# Patient Record
Sex: Female | Born: 1960 | Race: White | Hispanic: No | Marital: Married | Smoking: Former smoker
Health system: Southern US, Community
[De-identification: ages and names within clinical notes are randomized; demographics above are authoritative.]

## PROBLEM LIST (undated history)

## (undated) DIAGNOSIS — M1990 Unspecified osteoarthritis, unspecified site: Secondary | ICD-10-CM

## (undated) DIAGNOSIS — C50919 Malignant neoplasm of unspecified site of unspecified female breast: Secondary | ICD-10-CM

## (undated) DIAGNOSIS — G4730 Sleep apnea, unspecified: Secondary | ICD-10-CM

## (undated) DIAGNOSIS — R7303 Prediabetes: Secondary | ICD-10-CM

## (undated) DIAGNOSIS — K219 Gastro-esophageal reflux disease without esophagitis: Secondary | ICD-10-CM

## (undated) DIAGNOSIS — Z87442 Personal history of urinary calculi: Secondary | ICD-10-CM

## (undated) DIAGNOSIS — Z923 Personal history of irradiation: Secondary | ICD-10-CM

## (undated) DIAGNOSIS — M3500 Sicca syndrome, unspecified: Secondary | ICD-10-CM

## (undated) DIAGNOSIS — Z9989 Dependence on other enabling machines and devices: Secondary | ICD-10-CM

## (undated) DIAGNOSIS — H16072 Perforated corneal ulcer, left eye: Secondary | ICD-10-CM

## (undated) DIAGNOSIS — E0590 Thyrotoxicosis, unspecified without thyrotoxic crisis or storm: Secondary | ICD-10-CM

## (undated) DIAGNOSIS — E049 Nontoxic goiter, unspecified: Secondary | ICD-10-CM

## (undated) DIAGNOSIS — F419 Anxiety disorder, unspecified: Secondary | ICD-10-CM

## (undated) DIAGNOSIS — C189 Malignant neoplasm of colon, unspecified: Secondary | ICD-10-CM

## (undated) DIAGNOSIS — R319 Hematuria, unspecified: Secondary | ICD-10-CM

## (undated) DIAGNOSIS — F32A Depression, unspecified: Secondary | ICD-10-CM

## (undated) HISTORY — DX: Nontoxic goiter, unspecified: E04.9

## (undated) HISTORY — DX: Perforated corneal ulcer, left eye: H16.072

## (undated) HISTORY — PX: EYE SURGERY: SHX253

## (undated) HISTORY — DX: Sjogren syndrome, unspecified: M35.00

## (undated) HISTORY — DX: Thyrotoxicosis, unspecified without thyrotoxic crisis or storm: E05.90

## (undated) HISTORY — PX: COSMETIC SURGERY: SHX468

## (undated) HISTORY — DX: Malignant neoplasm of unspecified site of unspecified female breast: C50.919

## (undated) HISTORY — DX: Anxiety disorder, unspecified: F41.9

## (undated) HISTORY — DX: Hematuria, unspecified: R31.9

## (undated) HISTORY — PX: TONSILLECTOMY: SUR1361

---
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
## 2007-01-07 ENCOUNTER — Ambulatory Visit: Payer: Self-pay

## 2008-04-12 ENCOUNTER — Ambulatory Visit: Payer: Self-pay

## 2008-10-04 ENCOUNTER — Ambulatory Visit: Payer: Self-pay

## 2010-05-22 ENCOUNTER — Ambulatory Visit: Payer: Self-pay

## 2011-12-11 ENCOUNTER — Ambulatory Visit: Payer: Self-pay

## 2013-08-18 ENCOUNTER — Ambulatory Visit: Payer: Self-pay

## 2014-01-17 DIAGNOSIS — M3500 Sicca syndrome, unspecified: Secondary | ICD-10-CM | POA: Insufficient documentation

## 2014-01-31 DIAGNOSIS — Z6841 Body Mass Index (BMI) 40.0 and over, adult: Secondary | ICD-10-CM

## 2014-01-31 DIAGNOSIS — Z833 Family history of diabetes mellitus: Secondary | ICD-10-CM | POA: Insufficient documentation

## 2014-02-01 DIAGNOSIS — E559 Vitamin D deficiency, unspecified: Secondary | ICD-10-CM | POA: Insufficient documentation

## 2014-02-01 DIAGNOSIS — E785 Hyperlipidemia, unspecified: Secondary | ICD-10-CM | POA: Insufficient documentation

## 2014-02-01 DIAGNOSIS — R7302 Impaired glucose tolerance (oral): Secondary | ICD-10-CM | POA: Insufficient documentation

## 2014-12-06 ENCOUNTER — Other Ambulatory Visit: Payer: Self-pay | Admitting: Internal Medicine

## 2014-12-06 DIAGNOSIS — Z1231 Encounter for screening mammogram for malignant neoplasm of breast: Secondary | ICD-10-CM

## 2014-12-15 ENCOUNTER — Ambulatory Visit
Admission: RE | Admit: 2014-12-15 | Discharge: 2014-12-15 | Disposition: A | Discharge status: Home / Self Care | Payer: Self-pay | Source: Ambulatory Visit | Attending: Internal Medicine | Admitting: Internal Medicine

## 2014-12-15 DIAGNOSIS — Z1231 Encounter for screening mammogram for malignant neoplasm of breast: Secondary | ICD-10-CM | POA: Insufficient documentation

## 2015-11-24 DIAGNOSIS — L680 Hirsutism: Secondary | ICD-10-CM | POA: Insufficient documentation

## 2015-12-20 ENCOUNTER — Ambulatory Visit
Admission: RE | Admit: 2015-12-20 | Discharge: 2015-12-20 | Disposition: A | Discharge status: Home / Self Care | Payer: Self-pay | Source: Ambulatory Visit | Attending: Oncology | Admitting: Oncology

## 2015-12-20 ENCOUNTER — Ambulatory Visit: Payer: Self-pay | Attending: Oncology

## 2015-12-20 VITALS — BP 115/77 | HR 76 | Ht 61.02 in | Wt 206.5 lb

## 2015-12-20 DIAGNOSIS — Z0000 Encounter for general adult medical examination without abnormal findings: Secondary | ICD-10-CM

## 2015-12-20 NOTE — Progress Notes (Signed)
Subjective:     Patient ID: Sheena Martinez, female   DOB: September 07, 1960, 55 y.o.   MRN: [id number]  HPI   Review of Systems     Objective:   Physical Exam  Pulmonary/Chest: Right breast exhibits no inverted nipple, no mass, no nipple discharge, no skin change and no tenderness. Left breast exhibits no inverted nipple, no mass, no nipple discharge, no skin change and no tenderness. Breasts are symmetrical.       Assessment:     55 year old patient presents for [facility] clinic visit.  Patient screened, and meets BCCCP eligibility.  Patient does not have insurance, Medicare or Medicaid.  Handout given on Affordable Care Act. Instructed patient on breast self-exam using teach back method.  CBE unremarkable.     Plan:     Sent for bilateral screening mammogram.

## 2016-02-04 NOTE — Progress Notes (Signed)
Letter mailed from [facility] to notify of normal mammogram results.  Patient to return in one year for annual screening.  Copy to [facility]. 

## 2016-08-12 DIAGNOSIS — Z923 Personal history of irradiation: Secondary | ICD-10-CM

## 2016-08-12 HISTORY — DX: Personal history of irradiation: Z92.3

## 2016-08-12 HISTORY — PX: CYST EXCISION: SHX5701

## 2017-04-30 ENCOUNTER — Ambulatory Visit: Payer: Self-pay | Attending: Oncology

## 2017-04-30 ENCOUNTER — Ambulatory Visit
Admission: RE | Admit: 2017-04-30 | Discharge: 2017-04-30 | Disposition: A | Discharge status: Home / Self Care | Payer: Self-pay | Source: Ambulatory Visit | Attending: Oncology | Admitting: Oncology

## 2017-04-30 ENCOUNTER — Encounter (INDEPENDENT_AMBULATORY_CARE_PROVIDER_SITE_OTHER): Payer: Self-pay

## 2017-04-30 VITALS — BP 125/81 | HR 76 | Temp 98.5°F | Ht 61.0 in | Wt 217.0 lb

## 2017-04-30 DIAGNOSIS — Z0000 Encounter for general adult medical examination without abnormal findings: Secondary | ICD-10-CM

## 2017-04-30 NOTE — Progress Notes (Signed)
Subjective:     Patient ID: Sheena Martinez, female   DOB: 1960-11-12, 56 y.o.   MRN: [id number]  HPI   Review of Systems     Objective:   Physical Exam  Pulmonary/Chest: Right breast exhibits no inverted nipple, no mass, no nipple discharge, no skin change and no tenderness. Left breast exhibits no inverted nipple, no mass, no nipple discharge, no skin change and no tenderness. Breasts are symmetrical.       Assessment:     56 year old female returns for annual BCCCP screening. Patient screened, and meets BCCCP eligibility.  Patient does not have insurance, Medicare or Medicaid.  Handout given on Affordable Care Act.  Instructed patient on breast self-exam using teach back method.  CBE unremarkable.  No mass or lump palpated. Patient reports heavy bleeding with menstrual cycle x 3 months.  Encouraged her to call [facility] for consult. Informed patient she may need to fill out financial assistance forms since service will not be covered through Portal.    Plan:     Sent for bilateral screening mammogram.

## 2017-05-01 ENCOUNTER — Other Ambulatory Visit: Payer: Self-pay | Admitting: *Deleted

## 2017-05-01 DIAGNOSIS — N63 Unspecified lump in unspecified breast: Secondary | ICD-10-CM

## 2017-05-12 ENCOUNTER — Other Ambulatory Visit: Payer: Self-pay | Admitting: *Deleted

## 2017-05-12 ENCOUNTER — Ambulatory Visit
Admission: RE | Admit: 2017-05-12 | Discharge: 2017-05-12 | Disposition: A | Discharge status: Home / Self Care | Payer: Medicaid Other | Source: Ambulatory Visit

## 2017-05-12 ENCOUNTER — Ambulatory Visit
Admission: RE | Admit: 2017-05-12 | Discharge: 2017-05-12 | Disposition: A | Discharge status: Home / Self Care | Payer: Medicaid Other | Source: Ambulatory Visit | Attending: Oncology | Admitting: Oncology

## 2017-05-12 DIAGNOSIS — N63 Unspecified lump in unspecified breast: Secondary | ICD-10-CM

## 2017-05-12 DIAGNOSIS — C50212 Malignant neoplasm of upper-inner quadrant of left female breast: Secondary | ICD-10-CM | POA: Insufficient documentation

## 2017-05-12 DIAGNOSIS — N6322 Unspecified lump in the left breast, upper inner quadrant: Secondary | ICD-10-CM | POA: Insufficient documentation

## 2017-05-12 DIAGNOSIS — C50919 Malignant neoplasm of unspecified site of unspecified female breast: Secondary | ICD-10-CM

## 2017-05-12 HISTORY — PX: BREAST LUMPECTOMY: SHX2

## 2017-05-12 HISTORY — DX: Malignant neoplasm of unspecified site of unspecified female breast: C50.919

## 2017-05-12 HISTORY — PX: BREAST BIOPSY: SHX20

## 2017-05-13 ENCOUNTER — Encounter: Payer: Self-pay | Admitting: Family Medicine

## 2017-05-13 ENCOUNTER — Other Ambulatory Visit: Payer: Self-pay | Admitting: Pathology

## 2017-05-14 ENCOUNTER — Encounter: Payer: Self-pay | Admitting: General Surgery

## 2017-05-14 NOTE — Progress Notes (Signed)
  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  Radilogist spoke to patient with biopsy results.  Phoned patient to plan surgical/oncolgy follow-up She is scheduled to see Dr. Bary Castilla on 05/20/17 arriving at 4:30, and she will see Dr. Tasia Catchings in the [place] on 05/22/17 at 2:00. Will fill out BCCCPm forms at that visit.  Support given to patient.

## 2017-05-15 ENCOUNTER — Encounter: Payer: Self-pay | Admitting: *Deleted

## 2017-05-15 ENCOUNTER — Other Ambulatory Visit: Payer: Self-pay

## 2017-05-19 LAB — SURGICAL PATHOLOGY

## 2017-05-20 ENCOUNTER — Inpatient Hospital Stay: Payer: Self-pay

## 2017-05-20 ENCOUNTER — Encounter: Payer: Self-pay | Admitting: Diagnostic Radiology

## 2017-05-20 ENCOUNTER — Encounter: Payer: Self-pay | Admitting: *Deleted

## 2017-05-20 ENCOUNTER — Ambulatory Visit (INDEPENDENT_AMBULATORY_CARE_PROVIDER_SITE_OTHER): Payer: Medicaid Other | Admitting: General Surgery

## 2017-05-20 VITALS — BP 124/76 | HR 97 | Resp 14 | Ht 60.0 in | Wt 220.0 lb

## 2017-05-20 DIAGNOSIS — C50212 Malignant neoplasm of upper-inner quadrant of left female breast: Secondary | ICD-10-CM

## 2017-05-20 DIAGNOSIS — Z170 Estrogen receptor positive status [ER+]: Secondary | ICD-10-CM

## 2017-05-20 DIAGNOSIS — C50412 Malignant neoplasm of upper-outer quadrant of left female breast: Secondary | ICD-10-CM

## 2017-05-20 NOTE — Progress Notes (Signed)
Patient ID: Sheena Martinez, female   DOB: 21-Oct-1960, 56 y.o.   MRN: [id number]  Chief Complaint  Patient presents with  . Cancer    left breast    HPI Sheena Martinez is a 56 y.o. female.  who presents for a breast evaluation referred by Al Pimple BCCCP. The most recent mammogram and left breast biopsy was done on 05-12-17. She could not feel anything different in the breast. Patient does not perform regular self breast checks but she does get regular mammograms done.    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 She is here with her friend, Jacques Earthly.  HPI  Past Medical History:  Diagnosis Date  . 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 BREAST BIOPSY Left 05/12/2017   [place] bx/ INVASIVE MAMMARY CARCINOMA  . CESAREAN SECTION  1995  . CYST EXCISION  2018   pilar cyst/ Dr Will Bonnet    Family History  Problem Relation Age of Onset  . Breast cancer Maternal Aunt 80  . Diabetes Mother   . Liver disease Father     Social History Social History  Substance Use Topics  . Smoking status: Former Smoker    Years: 7.00    Quit date: 08/13/1979  . Smokeless tobacco: Never Used  . Alcohol use No    Allergies  Allergen Reactions  . Sulfamethoxazole-Trimethoprim Rash    Chest tightness. Bactrim    Current Outpatient Prescriptions  Medication Sig Dispense Refill  . meloxicam (MOBIC) 15 MG tablet Take 15 mg by mouth.    Marland Kitchen PARoxetine (PAXIL) 20 MG tablet Take 20 mg by mouth.     No current facility-administered medications for this visit.     Review of Systems Review of Systems  Constitutional: Negative.   Respiratory: Negative.   Cardiovascular: Negative.     Blood pressure 124/76, pulse 97, resp. rate 14, height 5' (1.524 m), weight 99.8 kg (220 lb), last menstrual period 05/11/2017.  Physical Exam Physical Exam  Constitutional: She is oriented to person,  place, and time. She appears well-developed and well-nourished.  HENT:  Mouth/Throat: Oropharynx is clear and moist.  Eyes: Conjunctivae are normal. No scleral icterus.  Neck: Neck supple.  Cardiovascular: Normal rate, regular rhythm and normal heart sounds.   Pulmonary/Chest: Effort normal and breath sounds normal. No respiratory distress. Right breast exhibits no inverted nipple, no mass, no nipple discharge, no skin change and no tenderness. Left breast exhibits no inverted nipple, no mass, no nipple discharge, no skin change and no tenderness.  Skin cyst mid sternum. Bruising at biopsy site left breast upper inner quadrant   Lymphadenopathy:    She has no cervical adenopathy.    She has no axillary adenopathy.  Neurological: She is alert and oriented to person, place, and time.  Skin: Skin is warm and dry.  Psychiatric: Her behavior is normal.    Data Reviewed Mammograms from May 2016 through October 2018 were reviewed. Ultrasound from 05/12/2017 reviewed. Small nodular area in the upper-inner quadrant of the left breast.   05/12/2017 biopsy results.  DIAGNOSIS:  A. LEFT BREAST, 11:00, 4 CMFN; BIOPSY:  - INVASIVE MAMMARY CARCINOMA, NO SPECIAL TYPE.  BREAST BIOMARKER TESTS  Estrogen Receptor (ER) Status: POSITIVE, >90% nuclear staining    Average intensity of staining: Strong  Progesterone Receptor (PgR) Status: POSITIVE, >90% nuclear staining    Average intensity of  staining: Strong  HER-2/neu negative by fish.  Ultrasound examination of the left breast was completed to determine a preoperative needle localization would be required. This was a very small lesion and with the distortion from her recent biopsy the area could not be clearly identified. No images, no charge.  Assessment    Stage I carcinoma the left breast.    Plan    The majority of the visit was spent reviewing the options for breast cancer treatment. Breast conservation with lumpectomy and radiation  therapy  was presented as equivalent to mastectomy for long-term control. The pros and cons of each treatment regimen were reviewed. The indications for additional therapy such as chemotherapy were touched on briefly, realizing that the majority of information required to determine if chemotherapy would be of benefit is not available at this time. Opportunity for second surgical opinion reviewed.     Informational websites provided. Brochure outlining treatment options provided. Opportunity for plastic surgery consultation discussed.  The patient is scheduled to meet with medical oncology on Thursday, October 11. I encouraged her to keep his appointment to have an opportunity to review her options once again and to get a formal opinion regarding the likelihood of chemotherapy based on knowledge presently available.  The patient will consider her options and notify the office of how she would like to proceed.   HPI, Physical Exam, Assessment and Plan have been scribed under the direction and in the presence of Robert Bellow, MD. Karie Fetch, RN  I have completed the exam and reviewed the above documentation for accuracy and completeness.  I agree with the above.  Haematologist has been used and any errors in dictation or transcription are unintentional.  Hervey Ard, M.D., F.A.C.S. Robert Bellow 05/22/2017, 8:03 AM

## 2017-05-20 NOTE — Patient Instructions (Signed)
The patient is aware to call back for any questions or concerns.  

## 2017-05-22 ENCOUNTER — Encounter: Payer: Self-pay | Admitting: Oncology

## 2017-05-22 ENCOUNTER — Inpatient Hospital Stay: Payer: Medicaid Other | Attending: Oncology | Admitting: Oncology

## 2017-05-22 VITALS — BP 120/77 | HR 62 | Temp 98.2°F | Resp 16 | Wt 221.1 lb

## 2017-05-22 DIAGNOSIS — Z803 Family history of malignant neoplasm of breast: Secondary | ICD-10-CM

## 2017-05-22 DIAGNOSIS — Z170 Estrogen receptor positive status [ER+]: Secondary | ICD-10-CM

## 2017-05-22 DIAGNOSIS — M3500 Sicca syndrome, unspecified: Secondary | ICD-10-CM

## 2017-05-22 DIAGNOSIS — C50212 Malignant neoplasm of upper-inner quadrant of left female breast: Secondary | ICD-10-CM

## 2017-05-22 DIAGNOSIS — E282 Polycystic ovarian syndrome: Secondary | ICD-10-CM | POA: Diagnosis not present

## 2017-05-22 DIAGNOSIS — Z87891 Personal history of nicotine dependence: Secondary | ICD-10-CM | POA: Diagnosis not present

## 2017-05-22 NOTE — Progress Notes (Signed)
Hematology/Oncology Consult note [facility] [phone] [phone]   Patient Care Team: Judeen Hammans, MD as PCP - General (Family Medicine) Jim Like, RN as Registered Nurse Scarlett Presto, RN as Registered Nurse  REFERRING PROVIDER: Judeen Hammans, MD CHIEF COMPLAINTS/PURPOSE OF CONSULTATION:  Evaluation of Breast cancer  HISTORY OF PRESENTING ILLNESS:  Sheena Martinez is a @ 56 y.o.  female with PMH listed below who was referred to me for evaluation of newly diagnosed breast cancer. Patient reports that she is been doing fine and she underwent screening annual mammogram which discovered abdominal findings.09/19/2018patient underwent screening, bilateral mammogram warrant further evaluation.on 05/12/2017, patient underwent diagnostic mammogram which discoveredleft breast irregular mass was spiculated margins in the upper slightly inner left breast. Ultrasound of the superior left breast showed hypoechoic mass with spiculated margins at 11:00, 4 cm from the nipple measuring 574 mm. Patient underwent biopsy of the left breast mass, pathology showed invasive mammary carcinoma. ER/PR >+90%, HER-2 IHC +2 equivocal, fish negative. Patient has been evaluated by Dr. Lemar Livings this week. Patient presented to the clinic today, accomplished by her daughter-in-law to establish care. Patient overall reports feeling well. She has a positive family history of breast cancer in her maternal aunt. 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 She does not have insurance pending applying Medicaid.  Denies feeling any lumps, nipple discharge. She reports mild soreness at the the site of biopsy. She has a history of Sjogren disease for which she has had tear duct surgery. She also reports history of PCOS with elevated testosterone level. She is perimenopausal, her last menstrual period reported as 3 months ago,  last for about 3 months. Denies any hormonal replacement therapy.     ROS:  Constitutional: Negative for fever, night sweats,unintentional weight loss, change in appetite. (+) weight gain HENT: Negative for ear pain, hearing loss, nasal bleeding Eyes: Negative for eye pain, double vision   Respiratory: Negative for wheezing, shortness of breath, cough Cardiovascular: Negative for chest pain, palpitation.   Gastrointestinal: Negative abdominal pain, diarrhea, nausea vomiting Endocrine: Negative  Genitourinary: Negative for dysuria, hematuria, frequency Skin: Negative for rash, iching, bruising Neurological: Negative for headache, dizziness, seizure Hematological: Negative for easy bruising/bleeding, lymph node enlargement Psychiatric/Behavioral: Negative for depression, anxiety, suicidality MEDICAL HISTORY:  Past Medical History:  Diagnosis Date  . 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 BREAST BIOPSY Left 05/12/2017   [place] bx/ INVASIVE MAMMARY CARCINOMA  . CESAREAN SECTION  1995  . CYST EXCISION  2018   pilar cyst/ Dr Orma Flaming    SOCIAL HISTORY: Social History   Social History  . Marital status: Married    Spouse name: N/A  . Number of children: N/A  . Years of education: N/A   Occupational History  . Not on file.   Social History Main Topics  . Smoking status: Former Smoker    Years: 7.00    Quit date: 08/13/1979  . Smokeless tobacco: Never Used  . Alcohol use No  . Drug use: No  . Sexual activity: Not on file   Other Topics Concern  . Not on file   Social History Narrative  . No narrative on file    FAMILY HISTORY: Family History  Problem Relation Age of Onset  . Breast  cancer Maternal Aunt 45  . Diabetes Mother   . Liver disease Father     ALLERGIES:  is allergic to sulfamethoxazole-trimethoprim.  MEDICATIONS:    Current Outpatient Prescriptions  Medication Sig Dispense Refill  . meloxicam (MOBIC) 15 MG tablet Take 15 mg by mouth.    Marland Kitchen PARoxetine (PAXIL) 20 MG tablet Take 20 mg by mouth.     No current facility-administered medications for this visit.       Marland Kitchen  PHYSICAL EXAMINATION: ECOG PERFORMANCE STATUS: 0 - Asymptomatic Vitals:   05/22/17 1427  BP: 120/77  Pulse: 62  Resp: 16  Temp: 98.2 F (36.8 C)   Filed Weights   05/22/17 1427  Weight: 221 lb 1 oz (100.3 kg)   GENERAL: No distress, well nourished.  SKIN:  No rashes or significant lesions  HEAD: Normocephalic, No masses, lesions, tenderness or abnormalities  EYES: Conjunctiva are pink, non icteric ENT: External ears normal ,lips , buccal mucosa, and tongue normal and mucous membranes are moist  LYMPH: No palpable cervical and axillary lymphadenopathy  LUNGS: Clear to auscultation, no crackles or wheezes HEART: Regular rate & rhythm, no murmurs, no gallops, S1 normal and S2 normal  ABDOMEN: Abdomen soft, non-tender, normal bowel sounds, I did not appreciate any  masses or organomegaly  MUSCULOSKELETAL: No CVA tenderness and no tenderness on percussion of the back or rib cage.  EXTREMITIES: No edema, no skin discoloration or tenderness NEURO: Alert & oriented, no focal motor/sensory deficits. Breast exam was performed in seated and lying down position. No evidence of any palpable masses. No evidence of axillary adenopathy.No nipple discharge.   LABORATORY DATA:  I have reviewed the data as listed: no recent labs.  No results found for: WBC, HGB, HCT, MCV, PLT No results for input(s): NA, K, CL, CO2, GLUCOSE, BUN, CREATININE, CALCIUM, GFRNONAA, GFRAA, PROT, ALBUMIN, AST, ALT, ALKPHOS, BILITOT, BILIDIR, IBILI in the last 8760 hours.  RADIOGRAPHIC STUDIES: I have personally reviewed the radiological images as listed and agreed with the findings in the report. Mammogram digital diagnostic 05/12/2017 FINDINGS: There is an  irregular mass with spiculated margins in the upper, slightly inner left breast, corresponding to the abnormality seen on screening mammogram.  Mammographic images were processed with CAD.  Targeted ultrasound of the superior left breast was performed demonstrating an irregular, hypoechoic mass with spiculated margins at 11 o'clock, 4 cm from the nipple measuring approximately 5 x 7 x 4 mm, corresponding to the mass seen mammographically. Targeted ultrasound of the left axilla demonstrates no suspicious appearing axillary lymph nodes.  IMPRESSION: Indeterminate left breast mass.  RECOMMENDATION: Ultrasound-guided left breast biopsy.  Pathology DIAGNOSIS:  A. LEFT BREAST, 11:00, 4 CMFN; BIOPSY:  - INVASIVE MAMMARY CARCINOMA, NO SPECIAL TYPE.   Size of invasive carcinoma: 0.5 cm in this sample  Histologic grade of invasive carcinoma: Grade 1    Glandular/tubular differentiation score: 2    Nuclear pleomorphism score: 2    Mitotic rate score: 1  DCIS: Negative  Lymphovascular invasion: Negative  BREAST BIOMARKER TESTS  Estrogen Receptor (ER) Status: POSITIVE, >90% nuclear staining    Average intensity of staining: Strong  Progesterone Receptor (PgR) Status: POSITIVE, >90% nuclear staining    Average intensity of staining: Strong  HER2 (by immunohistochemistry): EQUIVOCAL, 2+  Percentage of cells with uniform intense complete membrane staining: 0% Breast Biomarker Reporting Template: HER-2 FISH  BREAST BIOMARKER TESTS  HER2 (ERBB2) (by in situ hybridization): Negative  Number of observers: 2  Number of invasive tumor cells  counted: 40  Dual probe assay  Average number of HER2 signals per cell: 3.0  Average number of CEP17 signals per cell: 2.2  HER2/CEP17 ratio: 1.36   ASSESSMENT & PLAN:  1. Malignant neoplasm of upper-inner quadrant of left breast in female, estrogen receptor positive ([place])    Image results and the pathology results. were discussed  with patient. Recommend lumpectomy with sentinel lymph node biopsy followed by radiation. Would like to obtain Oncotype to evaluate her recurrence risk which will guide the need of chemotherapy. She is ER/PR strongly positive and will benefit from adjuvant hormonal therapy. Refer to RadOnc. Suggest patient to obtain a GYN evaluation her underlying PCOS management and determination of her menopausal status. . All questions were answered. The patient knows to call the clinic with any problems questions or concerns.  Return of visit: 1-2 weeks after surgery.  Thank you for this kind referral and the opportunity to participate in the care of this patient. A copy of today's note is routed to referring provider    Earlie Server, MD, PhD Hematology Oncology [facility] Pager- [phone] 05/22/2017

## 2017-05-22 NOTE — Progress Notes (Signed)
Patient here today as a new patient with breast cancer

## 2017-05-23 ENCOUNTER — Telehealth: Payer: Self-pay | Admitting: *Deleted

## 2017-05-23 NOTE — Telephone Encounter (Signed)
Patient called and wants to go ahead and schedule her left breast lumpectomy. Please call her back to get that scheduled.

## 2017-05-23 NOTE — Telephone Encounter (Signed)
Spoke with patient about her choice for surgery and she would like to go ahead and have a lumpectomy done. She would like to have this done on 06/09/17. I let her know we would be contacting her once orders were received and the surgery was posted with arrival time and location instructions.

## 2017-05-24 ENCOUNTER — Other Ambulatory Visit: Payer: Self-pay | Admitting: General Surgery

## 2017-05-26 ENCOUNTER — Other Ambulatory Visit: Payer: Self-pay | Admitting: General Surgery

## 2017-05-26 ENCOUNTER — Other Ambulatory Visit: Payer: Self-pay | Admitting: *Deleted

## 2017-05-26 DIAGNOSIS — C50212 Malignant neoplasm of upper-inner quadrant of left female breast: Secondary | ICD-10-CM

## 2017-05-26 DIAGNOSIS — Z170 Estrogen receptor positive status [ER+]: Secondary | ICD-10-CM

## 2017-05-28 ENCOUNTER — Telehealth: Payer: Self-pay | Admitting: *Deleted

## 2017-05-28 NOTE — Telephone Encounter (Signed)
Patient's surgery has been scheduled for 06-10-17 at [facility].  The patient was notified of arrival time and day of surgery instructions per Caryl-Lyn yesterday.

## 2017-06-03 ENCOUNTER — Inpatient Hospital Stay: Admission: RE | Admit: 2017-06-03 | Payer: Self-pay | Source: Ambulatory Visit

## 2017-06-04 ENCOUNTER — Encounter
Admission: RE | Admit: 2017-06-04 | Discharge: 2017-06-04 | Disposition: A | Discharge status: Home / Self Care | Payer: Self-pay | Source: Ambulatory Visit | Attending: General Surgery | Admitting: General Surgery

## 2017-06-04 HISTORY — DX: Gastro-esophageal reflux disease without esophagitis: K21.9

## 2017-06-04 HISTORY — DX: Unspecified osteoarthritis, unspecified site: M19.90

## 2017-06-04 NOTE — Patient Instructions (Signed)
  Your procedure is scheduled on: 06-10-17 Report to [place] AT 8:15 AM  Remember: Instructions that are not followed completely may result in serious medical risk, up to and including death, or upon the discretion of your surgeon and anesthesiologist your surgery may need to be rescheduled.    _x___ 1. Do not eat food after midnight the night before your procedure. You may drink clear liquids up to 2 hours before you are scheduled to arrive at the hospital for your procedure.  Do not drink clear liquids within 2 hours of your scheduled arrival to the hospital.  Clear liquids include  --Water or Apple juice without pulp  --Clear carbohydrate beverage such as ClearFast or Gatorade  --Black Coffee or Clear Tea (No milk, no creamers, do not add anything to the coffee or Tea Type 1 and type 2 diabetics should only drink water.  No gum chewing or hard candies.     __x__ 2. No Alcohol for 24 hours before or after surgery.   __x__3. No Smoking for 24 prior to surgery.   ____  4. Bring all medications with you on the day of surgery if instructed.    __x__ 5. Notify your doctor if there is any change in your medical condition     (cold, fever, infections).     Do not wear jewelry, make-up, hairpins, clips or nail polish.  Do not wear lotions, powders, or perfumes. You may wear deodorant.  Do not shave 48 hours prior to surgery. Men may shave face and neck.  Do not bring valuables to the hospital.    [facility] is not responsible for any belongings or valuables.               Contacts, dentures or bridgework may not be worn into surgery.  Leave your suitcase in the car. After surgery it may be brought to your room.  For patients admitted to the hospital, discharge time is determined by your  treatment team.   Patients discharged the day of surgery will not be allowed to drive home.  You will need someone to drive you home and stay with you the night of your procedure.     Please read over the following fact sheets that you were given:     ____ Take anti-hypertensive listed below, cardiac, seizure, asthma, anti-reflux and psychiatric medicines. These include:  1. NONE  2.  3.  4.  5.  6.  ____Fleets enema or Magnesium Citrate as directed.   ____ Use CHG Soap or sage wipes as directed on instruction sheet   ____ Use inhalers on the day of surgery and bring to hospital day of surgery  ____ Stop Metformin and Janumet 2 days prior to surgery.    ____ Take 1/2 of usual insulin dose the night before surgery and none on the morning surgery.   ____ Follow recommendations from Cardiologist, Pulmonologist or PCP regarding stopping Aspirin, Coumadin, Plavix ,Eliquis, Effient, or Pradaxa, and Pletal.  ____Stop Anti-inflammatories such as Advil, Aleve, Ibuprofen, Motrin, Naproxen, Naprosyn, Goodies powders or aspirin products. OK to take Tylenol    ____ Stop supplements until after surgery.     ____ Bring C-Pap to the hospital.

## 2017-06-04 NOTE — Progress Notes (Signed)
  Oncology Nurse Navigator Documentation  Navigator Location: [facility] Onc (06/04/17 0800)   )Navigator Encounter Type: Telephone (06/04/17 0800)                         Barriers/Navigation Needs: Financial (06/04/17 0800)   Interventions: Coordination of Care (06/04/17 0800)                      Time Spent with Patient: 15 (06/04/17 0800)   Notified patient that DSS has received response from [facility] for [facility],  She is to contact Jeanella Anton with account numbers for bills received so that Medicaid can be applied.

## 2017-06-10 ENCOUNTER — Ambulatory Visit: Payer: Medicaid Other | Admitting: Certified Registered Nurse Anesthetist

## 2017-06-10 ENCOUNTER — Ambulatory Visit
Admission: RE | Admit: 2017-06-10 | Discharge: 2017-06-10 | Disposition: A | Discharge status: Home / Self Care | Payer: Medicaid Other | Source: Ambulatory Visit | Attending: General Surgery | Admitting: General Surgery

## 2017-06-10 ENCOUNTER — Encounter
Admission: RE | Disposition: A | Discharge status: Home / Self Care | Payer: Self-pay | Source: Ambulatory Visit | Attending: General Surgery

## 2017-06-10 DIAGNOSIS — C50212 Malignant neoplasm of upper-inner quadrant of left female breast: Secondary | ICD-10-CM

## 2017-06-10 DIAGNOSIS — F419 Anxiety disorder, unspecified: Secondary | ICD-10-CM | POA: Diagnosis not present

## 2017-06-10 DIAGNOSIS — Z170 Estrogen receptor positive status [ER+]: Secondary | ICD-10-CM

## 2017-06-10 DIAGNOSIS — E039 Hypothyroidism, unspecified: Secondary | ICD-10-CM | POA: Insufficient documentation

## 2017-06-10 DIAGNOSIS — C50912 Malignant neoplasm of unspecified site of left female breast: Secondary | ICD-10-CM | POA: Diagnosis present

## 2017-06-10 DIAGNOSIS — Z87891 Personal history of nicotine dependence: Secondary | ICD-10-CM | POA: Insufficient documentation

## 2017-06-10 DIAGNOSIS — K219 Gastro-esophageal reflux disease without esophagitis: Secondary | ICD-10-CM | POA: Diagnosis not present

## 2017-06-10 DIAGNOSIS — Z6841 Body Mass Index (BMI) 40.0 and over, adult: Secondary | ICD-10-CM | POA: Insufficient documentation

## 2017-06-10 HISTORY — PX: BREAST LUMPECTOMY WITH SENTINEL LYMPH NODE BIOPSY: SHX5597

## 2017-06-10 LAB — POCT PREGNANCY, URINE: PREG TEST UR: NEGATIVE

## 2017-06-10 SURGERY — BREAST LUMPECTOMY WITH SENTINEL LYMPH NODE BX
Anesthesia: General | Site: Breast | Laterality: Left

## 2017-06-10 MED ORDER — HYDROCODONE-ACETAMINOPHEN 5-325 MG PO TABS
ORAL_TABLET | ORAL | Status: AC
  Administered 2017-06-10: 1 via ORAL
  Filled 2017-06-10: qty 1

## 2017-06-10 MED ORDER — LIDOCAINE HCL (CARDIAC) 20 MG/ML IV SOLN
INTRAVENOUS | Status: DC | PRN
  Administered 2017-06-10: 60 mg via INTRAVENOUS

## 2017-06-10 MED ORDER — FENTANYL CITRATE (PF) 100 MCG/2ML IJ SOLN
INTRAMUSCULAR | Status: AC
  Filled 2017-06-10: qty 2

## 2017-06-10 MED ORDER — LACTATED RINGERS IV SOLN
INTRAVENOUS | Status: DC
  Administered 2017-06-10: 10:00:00 via INTRAVENOUS

## 2017-06-10 MED ORDER — MIDAZOLAM HCL 2 MG/2ML IJ SOLN
INTRAMUSCULAR | Status: AC
  Filled 2017-06-10: qty 2

## 2017-06-10 MED ORDER — MIDAZOLAM HCL 2 MG/2ML IJ SOLN
INTRAMUSCULAR | Status: DC | PRN
  Administered 2017-06-10: 2 mg via INTRAVENOUS

## 2017-06-10 MED ORDER — DEXAMETHASONE SODIUM PHOSPHATE 10 MG/ML IJ SOLN
INTRAMUSCULAR | Status: AC
  Filled 2017-06-10: qty 1

## 2017-06-10 MED ORDER — TECHNETIUM TC 99M SULFUR COLLOID FILTERED
0.8420 | Freq: Once | INTRAVENOUS | Status: AC | PRN
  Administered 2017-06-10: 0.842 via INTRADERMAL

## 2017-06-10 MED ORDER — GLYCOPYRROLATE 0.2 MG/ML IJ SOLN
INTRAMUSCULAR | Status: DC | PRN
  Administered 2017-06-10: 0.2 mg via INTRAVENOUS

## 2017-06-10 MED ORDER — FAMOTIDINE 20 MG PO TABS
ORAL_TABLET | ORAL | Status: AC
  Administered 2017-06-10: 20 mg via ORAL
  Filled 2017-06-10: qty 1

## 2017-06-10 MED ORDER — HYDROCODONE-ACETAMINOPHEN 5-325 MG PO TABS: 1.0000 | ORAL_TABLET | ORAL | 0 refills | Status: DC | PRN

## 2017-06-10 MED ORDER — BUPIVACAINE-EPINEPHRINE (PF) 0.5% -1:200000 IJ SOLN
INTRAMUSCULAR | Status: AC
  Filled 2017-06-10: qty 30

## 2017-06-10 MED ORDER — FENTANYL CITRATE (PF) 100 MCG/2ML IJ SOLN
25.0000 ug | INTRAMUSCULAR | Status: AC | PRN
  Administered 2017-06-10 (×6): 25 ug via INTRAVENOUS

## 2017-06-10 MED ORDER — ONDANSETRON HCL 4 MG/2ML IJ SOLN
INTRAMUSCULAR | Status: DC | PRN
  Administered 2017-06-10: 4 mg via INTRAVENOUS

## 2017-06-10 MED ORDER — ONDANSETRON HCL 4 MG/2ML IJ SOLN
INTRAMUSCULAR | Status: AC
  Filled 2017-06-10: qty 2

## 2017-06-10 MED ORDER — DEXAMETHASONE SODIUM PHOSPHATE 10 MG/ML IJ SOLN
INTRAMUSCULAR | Status: DC | PRN
  Administered 2017-06-10: 10 mg via INTRAVENOUS

## 2017-06-10 MED ORDER — BUPIVACAINE-EPINEPHRINE (PF) 0.5% -1:200000 IJ SOLN
INTRAMUSCULAR | Status: DC | PRN
  Administered 2017-06-10: 30 mL

## 2017-06-10 MED ORDER — KETOROLAC TROMETHAMINE 30 MG/ML IJ SOLN
INTRAMUSCULAR | Status: AC
  Filled 2017-06-10: qty 1

## 2017-06-10 MED ORDER — ACETAMINOPHEN 10 MG/ML IV SOLN
INTRAVENOUS | Status: AC
Start: 2017-06-10 — End: 2017-06-10
  Filled 2017-06-10: qty 100

## 2017-06-10 MED ORDER — FENTANYL CITRATE (PF) 100 MCG/2ML IJ SOLN
INTRAMUSCULAR | Status: AC
  Administered 2017-06-10: 25 ug via INTRAVENOUS
  Filled 2017-06-10: qty 2

## 2017-06-10 MED ORDER — METHYLENE BLUE 0.5 % INJ SOLN
INTRAVENOUS | Status: AC
  Filled 2017-06-10: qty 10

## 2017-06-10 MED ORDER — METHYLENE BLUE 0.5 % INJ SOLN
INTRAVENOUS | Status: DC | PRN
  Administered 2017-06-10: 5 mL via SUBMUCOSAL

## 2017-06-10 MED ORDER — HYDROCODONE-ACETAMINOPHEN 5-325 MG PO TABS
ORAL_TABLET | ORAL | Status: AC
  Filled 2017-06-10: qty 1

## 2017-06-10 MED ORDER — FAMOTIDINE 20 MG PO TABS
20.0000 mg | ORAL_TABLET | Freq: Once | ORAL | Status: AC
  Administered 2017-06-10: 20 mg via ORAL

## 2017-06-10 MED ORDER — FENTANYL CITRATE (PF) 100 MCG/2ML IJ SOLN
INTRAMUSCULAR | Status: DC | PRN
  Administered 2017-06-10: 25 ug via INTRAVENOUS
  Administered 2017-06-10: 50 ug via INTRAVENOUS
  Administered 2017-06-10 (×3): 25 ug via INTRAVENOUS
  Administered 2017-06-10: 50 ug via INTRAVENOUS

## 2017-06-10 MED ORDER — ONDANSETRON HCL 4 MG/2ML IJ SOLN
4.0000 mg | Freq: Once | INTRAMUSCULAR | Status: DC | PRN
Start: 2017-06-10 — End: 2017-06-10

## 2017-06-10 MED ORDER — ACETAMINOPHEN 10 MG/ML IV SOLN
INTRAVENOUS | Status: DC | PRN
  Administered 2017-06-10: 1000 mg via INTRAVENOUS

## 2017-06-10 MED ORDER — PHENYLEPHRINE HCL 10 MG/ML IJ SOLN
INTRAMUSCULAR | Status: DC | PRN
  Administered 2017-06-10 (×2): 100 ug via INTRAVENOUS
  Administered 2017-06-10: 50 ug via INTRAVENOUS
  Administered 2017-06-10 (×5): 100 ug via INTRAVENOUS

## 2017-06-10 MED ORDER — KETOROLAC TROMETHAMINE 30 MG/ML IJ SOLN
INTRAMUSCULAR | Status: DC | PRN
  Administered 2017-06-10: 30 mg via INTRAVENOUS

## 2017-06-10 MED ORDER — HYDROCODONE-ACETAMINOPHEN 5-325 MG PO TABS
1.0000 | ORAL_TABLET | ORAL | Status: DC | PRN
  Administered 2017-06-10 (×2): 1 via ORAL

## 2017-06-10 MED ORDER — PROPOFOL 10 MG/ML IV BOLUS
INTRAVENOUS | Status: DC | PRN
  Administered 2017-06-10: 200 mg via INTRAVENOUS

## 2017-06-10 SURGICAL SUPPLY — 51 items
BANDAGE ELASTIC 6 LF NS (GAUZE/BANDAGES/DRESSINGS) ×2 IMPLANT
BLADE SURG 15 STRL SS SAFETY (BLADE) ×4 IMPLANT
BNDG GAUZE 4.5X4.1 6PLY STRL (MISCELLANEOUS) ×2 IMPLANT
BRA SURGICAL MEDIUM (MISCELLANEOUS) ×2 IMPLANT
BULB RESERV EVAC DRAIN JP 100C (MISCELLANEOUS) IMPLANT
CANISTER SUCT 1200ML W/VALVE (MISCELLANEOUS) ×2 IMPLANT
CHLORAPREP W/TINT 26ML (MISCELLANEOUS) ×2 IMPLANT
CNTNR SPEC 2.5X3XGRAD LEK (MISCELLANEOUS) ×1
CONT SPEC 4OZ STER OR WHT (MISCELLANEOUS) ×1
CONTAINER SPEC 2.5X3XGRAD LEK (MISCELLANEOUS) ×1 IMPLANT
COVER PROBE FLX POLY STRL (MISCELLANEOUS) ×2 IMPLANT
DEVICE DUBIN SPECIMEN MAMMOGRA (MISCELLANEOUS) ×2 IMPLANT
DRAIN CHANNEL JP 15F RND 16 (MISCELLANEOUS) IMPLANT
DRAPE LAPAROTOMY TRNSV 106X77 (MISCELLANEOUS) ×2 IMPLANT
DRSG TELFA 3X8 NADH (GAUZE/BANDAGES/DRESSINGS) ×2 IMPLANT
ELECT CAUTERY BLADE TIP 2.5 (TIP) ×2
ELECT REM PT RETURN 9FT ADLT (ELECTROSURGICAL) ×2
ELECTRODE CAUTERY BLDE TIP 2.5 (TIP) ×1 IMPLANT
ELECTRODE REM PT RTRN 9FT ADLT (ELECTROSURGICAL) ×1 IMPLANT
GAUZE FLUFF 18X24 1PLY STRL (GAUZE/BANDAGES/DRESSINGS) ×2 IMPLANT
GAUZE SPONGE 4X4 12PLY STRL (GAUZE/BANDAGES/DRESSINGS) ×2 IMPLANT
GLOVE BIO SURGEON STRL SZ7.5 (GLOVE) ×2 IMPLANT
GLOVE INDICATOR 8.0 STRL GRN (GLOVE) ×2 IMPLANT
GOWN STRL REUS W/ TWL LRG LVL3 (GOWN DISPOSABLE) ×2 IMPLANT
GOWN STRL REUS W/TWL LRG LVL3 (GOWN DISPOSABLE) ×2
KIT RM TURNOVER STRD PROC AR (KITS) ×2 IMPLANT
LABEL OR SOLS (LABEL) ×2 IMPLANT
MARGIN MAP 10MM (MISCELLANEOUS) ×2 IMPLANT
NDL SAFETY 22GX1.5 (NEEDLE) ×2 IMPLANT
NEEDLE HYPO 25X1 1.5 SAFETY (NEEDLE) ×4 IMPLANT
PACK BASIN MINOR ARMC (MISCELLANEOUS) ×2 IMPLANT
SHEARS FOC LG CVD HARMONIC 17C (MISCELLANEOUS) IMPLANT
SHEARS HARMONIC 9CM CVD (BLADE) ×2 IMPLANT
SLEVE PROBE SENORX GAMMA FIND (MISCELLANEOUS) ×2 IMPLANT
STRIP CLOSURE SKIN 1/2X4 (GAUZE/BANDAGES/DRESSINGS) ×2 IMPLANT
SUT ETHILON 3-0 FS-10 30 BLK (SUTURE) ×2
SUT SILK 2 0 (SUTURE) ×1
SUT SILK 2-0 18XBRD TIE 12 (SUTURE) ×1 IMPLANT
SUT VIC AB 2-0 CT1 27 (SUTURE) ×3
SUT VIC AB 2-0 CT1 TAPERPNT 27 (SUTURE) ×3 IMPLANT
SUT VIC AB 3-0 SH 27 (SUTURE) ×2
SUT VIC AB 3-0 SH 27X BRD (SUTURE) ×2 IMPLANT
SUT VIC AB 4-0 FS2 27 (SUTURE) ×4 IMPLANT
SUT VICRYL+ 3-0 144IN (SUTURE) ×2 IMPLANT
SUTURE EHLN 3-0 FS-10 30 BLK (SUTURE) ×1 IMPLANT
SWABSTK COMLB BENZOIN TINCTURE (MISCELLANEOUS) ×2 IMPLANT
SYR BULB IRRIG 60ML STRL (SYRINGE) ×2 IMPLANT
SYR CONTROL 10ML (SYRINGE) ×2 IMPLANT
SYRINGE 10CC LL (SYRINGE) ×2 IMPLANT
TAPE TRANSPORE STRL 2 31045 (GAUZE/BANDAGES/DRESSINGS) ×2 IMPLANT
WATER STERILE IRR 1000ML POUR (IV SOLUTION) ×2 IMPLANT

## 2017-06-10 NOTE — Anesthesia Postprocedure Evaluation (Signed)
Anesthesia Post Note  Patient: Sheena Martinez  Procedure(s) Performed: BREAST LUMPECTOMY WITH SENTINEL LYMPH NODE BX AND NEEDLE LOCALIZATION (Left Breast)  Patient location during evaluation: PACU Anesthesia Type: General Level of consciousness: awake Pain management: pain level controlled Vital Signs Assessment: post-procedure vital signs reviewed and stable Respiratory status: spontaneous breathing Cardiovascular status: stable Anesthetic complications: no     Last Vitals:  Vitals:   06/10/17 1254 06/10/17 1311  BP: 138/83 136/73  Pulse: (!) 105 96  Resp: 14 16  Temp: 36.6 C 36.6 C  SpO2: 91% 92%    Last Pain:  Vitals:   06/10/17 1311  TempSrc: Temporal  PainSc: 8                  VAN STAVEREN,Lochlan Grygiel

## 2017-06-10 NOTE — Op Note (Signed)
Preoperative  diagnosis: Left breast cancer.  Postoperative diagnosis: Same.  Operative procedure: Wide excision left breast with wire localization ultrasound guidance, sentinel node biopsy.  Operating Surgeon: Hervey Ard, MD.  Anesthesia: General by LMA, Marcaine 0.5% with 1-200,000 units of epinephrine, 30 cc.  Estimated blood loss: 5 cc.  Clinical note: This 56 year old woman was identified with a small invasive mammary cancer.  She desired breast conservation.  She was admitted for planned wide excision and sentinel node biopsy.  She underwent needle localization prior to the procedure as well as injection with technetium sulfur colloid.  Operative note: After the induction of general anesthesia at the area of the nipple was cleansed with alcohol and 5 cc of 1/2% methylene blue instilled.  The breast chest and axilla was then cleansed with ChloraPrep and draped.  Ultrasound was used to identify the biopsy cavity by following the wire into the breast parenchyma.  Local anesthesia was infiltrated and a curvilinear incision from the 11 to 1 o'clock position was made carried down through skin subtendinous tissue.  Hemostasis was with electrocautery.  The localizing wire was brought into the field.  A 2-1/2 x 2-1/2 x 4 cm block of tissue extending down to but not including the fascia was excised, orientated and sent for specimen radiograph.  A significant delay incurred while the specimen form was labeled as left breast in spite of the fact that the patient had previously had a wire localization completed.  Specimen radiograph confirmed the previous biopsy clip in the tip of the wire was included.  Gross examination by pathology showed clear margins.  While the breast specimen was being processed attention was turned to the axilla.  The node seeker device was used to identify an area of increased uptake.  A transverse incision was made after instillation of local anesthetic.  Hemostasis again  with electrocautery.  A single hot blue lymph node was identified with counts of 5000.  Background counts of less than 100.  The axillary wound was closed in layers with 2-0 Vicryl figure-of-eight sutures.  The skin was closed with a running 4-0 Vicryl subcuticular suture.  The breast wound was closed in layers with 2-0 Vicryl figure-of-eight sutures for the deep layer and a running 2-0 Vicryl suture for the adipose layer.  The skin was closed with a running 4-0 Vicryl septic with suture.  Benzoin and Steri-Strips were applied.  Telfa, fluff gauze and a surgical bra was applied.  Patient tolerated the procedure well and was taken to recovery room in stable condition.

## 2017-06-10 NOTE — Transfer of Care (Signed)
Immediate Anesthesia Transfer of Care Note  Patient: Sheena Martinez  Procedure(s) Performed: BREAST LUMPECTOMY WITH SENTINEL LYMPH NODE BX AND NEEDLE LOCALIZATION (Left Breast)  Patient Location: PACU  Anesthesia Type:General  Level of Consciousness: awake and alert   Airway & Oxygen Therapy: Patient Spontanous Breathing and Patient connected to face mask oxygen  Post-op Assessment: Report given to RN and Post -op Vital signs reviewed and stable  Post vital signs: Reviewed and stable  Last Vitals:  Vitals:   06/10/17 0954  BP: (!) 141/66  Pulse: 72  Resp: 16  Temp: 36.8 C  SpO2: 99%    Last Pain:  Vitals:   06/10/17 0954  TempSrc: Tympanic         Complications: No apparent anesthesia complications

## 2017-06-10 NOTE — Discharge Instructions (Addendum)

## 2017-06-10 NOTE — Anesthesia Post-op Follow-up Note (Signed)
Anesthesia QCDR form completed.        

## 2017-06-10 NOTE — Anesthesia Preprocedure Evaluation (Signed)
Anesthesia Evaluation  Patient identified by MRN, date of birth, ID band Patient awake    Reviewed: Allergy & Precautions, NPO status , Patient's Chart, lab work & pertinent test results  Airway Mallampati: III  TM Distance: >3 FB     Dental  (+) Teeth Intact   Pulmonary neg pulmonary ROS, former smoker,     + decreased breath sounds      Cardiovascular Exercise Tolerance: Good  Rhythm:Regular Rate:Normal     Neuro/Psych Anxiety    GI/Hepatic Neg liver ROS, GERD  Medicated,  Endo/Other  Hypothyroidism Morbid obesity  Renal/GU negative Renal ROS  negative genitourinary   Musculoskeletal   Abdominal (+) + obese,   Peds negative pediatric ROS (+)  Hematology   Anesthesia Other Findings   Reproductive/Obstetrics                             Anesthesia Physical Anesthesia Plan  ASA: II  Anesthesia Plan: General   Post-op Pain Management:    Induction: Intravenous  PONV Risk Score and Plan: 1 and Ondansetron and Dexamethasone  Airway Management Planned: LMA  Additional Equipment:   Intra-op Plan:   Post-operative Plan: Extubation in OR  Informed Consent: I have reviewed the patients History and Physical, chart, labs and discussed the procedure including the risks, benefits and alternatives for the proposed anesthesia with the patient or authorized representative who has indicated his/her understanding and acceptance.     Plan Discussed with: CRNA  Anesthesia Plan Comments:         Anesthesia Quick Evaluation

## 2017-06-10 NOTE — Progress Notes (Signed)
Oncology Nurse Navigator Documentation  Navigator Location: [facility] (06/10/17 1000)   )        Surgery Date: 06/10/17 (06/10/17 1000)                 Barriers/Navigation Needs: Financial (06/10/17 1000)   Interventions: Coordination of Care;Psycho-social support (06/10/17 1000)          Specialty Items/DME:  (bras) (06/10/17 1000)           Time Spent with Patient: 30 (06/10/17 1000)   Patient phoned  Yesterday needing post-op bras.    Approval sent to [facility] through [employer].  Met patient ,and family in [facility] Day Surgery today to offer support.

## 2017-06-10 NOTE — H&P (Signed)
No change in clinical history or exam.  For left breast wide excision and sentinel node biopsy.

## 2017-06-10 NOTE — Anesthesia Procedure Notes (Signed)
Procedure Name: LMA Insertion Performed by: Demetrius Charity Pre-anesthesia Checklist: Patient identified, Patient being monitored, Timeout performed, Emergency Drugs available and Suction available Patient Re-evaluated:Patient Re-evaluated prior to induction Oxygen Delivery Method: Circle system utilized Preoxygenation: Pre-oxygenation with 100% oxygen Induction Type: IV induction Ventilation: Mask ventilation without difficulty LMA: LMA inserted LMA Size: 3.0 Tube type: Oral Number of attempts: 1 Placement Confirmation: positive ETCO2 and breath sounds checked- equal and bilateral Tube secured with: Tape Dental Injury: Teeth and Oropharynx as per pre-operative assessment

## 2017-06-11 ENCOUNTER — Encounter: Payer: Self-pay | Admitting: General Surgery

## 2017-06-11 LAB — SURGICAL PATHOLOGY

## 2017-06-11 NOTE — Progress Notes (Signed)
  Oncology Nurse Navigator Documentation  Navigator Location: [facility] (06/11/17 1500)   )Navigator Encounter Type: Telephone (06/11/17 1500) Telephone: Lahoma Crocker Call;Appt Confirmation/Clarification (06/11/17 1500)                       Barriers/Navigation Needs: Coordination of Care (06/11/17 1500)   Interventions: Coordination of Care (06/11/17 1500)   Coordination of Care: Appts (06/11/17 1500)                  Time Spent with Patient: 30 (06/11/17 1500)  Phoned patient one day post op.  States she had some agitation, and discomfort in the night.  States she is feeling better now.  Per Geraldine Solar CMA, Oncotype will be sent.  Patient to be scheduled for follow-up with Dr. Tasia Catchings in 2 weeks.

## 2017-06-12 ENCOUNTER — Telehealth: Payer: Self-pay | Admitting: *Deleted

## 2017-06-12 NOTE — Telephone Encounter (Signed)
-----   Message from Robert Bellow, MD sent at 06/11/2017  4:10 PM EDT ----- Please check with the lab and see if any request for Oncotype or Mammoprint testing was submitted for this patient.  If not see if they can send the sample off for Mammoprint.  Thank you

## 2017-06-12 NOTE — Telephone Encounter (Signed)
I called [facility] pathology, no orders that they are aware of. Mammoprint order faxed to [place].

## 2017-06-12 NOTE — Telephone Encounter (Deleted)
-----   Message from Robert Bellow, MD sent at 06/11/2017  4:10 PM EDT ----- Please check with the lab and see if any request for Oncotype or Mammoprint testing was submitted for this patient.  If not see if they can send the sample off for Mammoprint.  Thank you

## 2017-06-13 ENCOUNTER — Other Ambulatory Visit: Payer: Self-pay | Admitting: *Deleted

## 2017-06-13 DIAGNOSIS — C50212 Malignant neoplasm of upper-inner quadrant of left female breast: Secondary | ICD-10-CM

## 2017-06-13 DIAGNOSIS — Z170 Estrogen receptor positive status [ER+]: Secondary | ICD-10-CM

## 2017-06-17 ENCOUNTER — Telehealth: Payer: Self-pay

## 2017-06-17 ENCOUNTER — Encounter: Payer: Self-pay | Admitting: General Surgery

## 2017-06-17 ENCOUNTER — Ambulatory Visit (INDEPENDENT_AMBULATORY_CARE_PROVIDER_SITE_OTHER): Payer: Medicaid Other | Admitting: General Surgery

## 2017-06-17 ENCOUNTER — Inpatient Hospital Stay: Payer: Self-pay

## 2017-06-17 VITALS — BP 116/76 | HR 78 | Resp 12 | Ht 60.0 in | Wt 224.0 lb

## 2017-06-17 DIAGNOSIS — Z170 Estrogen receptor positive status [ER+]: Secondary | ICD-10-CM

## 2017-06-17 DIAGNOSIS — C50212 Malignant neoplasm of upper-inner quadrant of left female breast: Secondary | ICD-10-CM | POA: Diagnosis not present

## 2017-06-17 NOTE — Telephone Encounter (Signed)
Patient notified of appointment with Dr Baruch Gouty.

## 2017-06-17 NOTE — Patient Instructions (Addendum)
The patient is aware to call back for any questions or concerns.  Appointment with Dr Baruch Gouty, radiation oncologist for possible mammosite.  The patient is scheduled with Dr Baruch Gouty on 06/19/17 at 10:30 am.

## 2017-06-17 NOTE — Progress Notes (Signed)
Patient ID: Sheena Martinez, female   DOB: Dec 18, 1960, 56 y.o.   MRN: [id number]  Chief Complaint  Patient presents with  . Routine Post Op    HPI Sheena Martinez is a 56 y.o. female.  Here today for postoperative visit, she states she is doing well. She does take a pain pill occasionally but he area mostly itches. Denies any gastrointestinal issues, bowels are moving regular with the help of ducolax. She is here with her daughter in law Sheena Martinez.  HPI  Past Medical History:  Diagnosis Date  . Anxiety   . Arthritis   . 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 GERD (gastroesophageal reflux disease)    OCC  . Hypothyroidism   . Sjogren's syndrome [facility])     Past Surgical History:  Procedure Laterality Date  . BREAST BIOPSY Left 05/12/2017   [place] bx/ INVASIVE MAMMARY CARCINOMA  . CESAREAN SECTION  1995  . COSMETIC SURGERY    . CYST EXCISION  2018   pilar cyst/ Dr Will Bonnet  . TONSILLECTOMY      Family History  Problem Relation Age of Onset  . Breast cancer Maternal Aunt 59  . Diabetes Mother   . Skin cancer Mother   . Anemia Mother   . Liver disease Father   . Lung cancer Paternal Aunt   . Stomach cancer Paternal Uncle   . Ovarian cancer Maternal Grandmother   . Stomach cancer Paternal Grandfather     Social History Social History   Tobacco Use  . Smoking status: Former Smoker    Years: 7.00    Types: Cigarettes    Last attempt to quit: 08/13/1979    Years since quitting: 37.8  . Smokeless tobacco: Never Used  . Tobacco comment: AGE [age]  Substance Use Topics  . Alcohol use: No  . Drug use: No    Allergies  Allergen Reactions  . Sulfamethoxazole-Trimethoprim Rash    Chest tightness. Bactrim    Current Outpatient Medications  Medication Sig Dispense Refill  . acetaminophen (TYLENOL) 500 MG tablet Take 1,000 mg by mouth every 8 (eight) hours as needed for mild pain or headache.    . Boric Acid  POWD by Does not apply route as needed.    . Ca Carbonate-Mag Hydroxide (ROLAIDS PO) Take 1 tablet by mouth as needed.    . Carboxymethylcell-Hypromellose (GENTEAL OP) Apply 1 drop to eye 2 (two) times daily.    Marland Kitchen HYDROcodone-acetaminophen (NORCO) 5-325 MG tablet Take 1-2 tablets by mouth every 4 (four) hours as needed for moderate pain. Take no more than 10 tabs/ day. 30 tablet 0  . ketoconazole (NIZORAL) 2 % shampoo Apply 1 application topically 2 (two) times a week.    . meloxicam (MOBIC) 15 MG tablet Take 15 mg by mouth at bedtime.     Marland Kitchen PARoxetine (PAXIL) 20 MG tablet Take 20 mg by mouth at bedtime.     . prednisoLONE sodium phosphate (INFLAMASE FORTE) 1 % ophthalmic solution Place 1 drop into both eyes as needed.     No current facility-administered medications for this visit.     Review of Systems Review of Systems  Constitutional: Negative.   Respiratory: Negative.   Cardiovascular: Negative.     Blood pressure 116/76, pulse 78, resp. rate 12, height 5' (1.524 m), weight 224 lb (101.6 kg), last menstrual period 05/26/2017.  Physical Exam Physical Exam  Constitutional: She is oriented to  person, place, and time. She appears well-developed and well-nourished.  Pulmonary/Chest:    Left breast incision clean   Neurological: She is alert and oriented to person, place, and time.  Skin: Skin is warm and dry.  Psychiatric: Her behavior is normal.    Data Reviewed 06/10/2017 wide excision: 6 mm tumor A. BREAST, LEFT; NEEDLE LOCALIZED WIDE EXCISION:  - INVASIVE MAMMARY CARCINOMA, NO SPECIAL TYPE.  - SEE CANCER SUMMARY BELOW.  - PREVIOUS BIOPSY SITE CHANGE.   B. SENTINEL LYMPH NODE; EXCISION:  - ONE LYMPH NODE NEGATIVE FOR MALIGNANCY (0/1).   BREAST BIOMARKER TESTS  Estrogen Receptor (ER) Status: POSITIVE, >90% nuclear staining    Average intensity of staining: Strong  Progesterone Receptor (PgR) Status: POSITIVE, >90% nuclear staining    Average intensity of  staining: Strong  HER2 (by immunohistochemistry): EQUIVOCAL, 2+    Percentage of cells with uniform intense complete membrane  staining: 0%  HER2 (ERBB2) (by in situ hybridization): NEGATIVE   Ultrasound examination of the wide excision site shows a deep-seated seroma cavity measuring 1.7 x 1.72 x 4.45 cm. This is 2.6-3.0 cm deep to the skin.  Mammoprint testing pending.  Assessment    Doing well status post wide excision and sentinel node biopsy.  Candidate for accelerated or whole breradiation.    Plan     Pros and cons of whole breast versus accelerated partial breast radiation reviewed. Final decsion with radiation oncology.     Appointment with Dr Baruch Gouty, radiation oncologist for possible mammosite.  The patient is scheduled with Dr Baruch Gouty on 06/19/17 at 10:30 am. Documented by Lesly Rubenstein LPN  HPI, Physical Exam, Assessment and Plan have been scribed under the direction and in the presence of Robert Bellow, MD. Karie Fetch, RN  I have completed the exam and reviewed the above documentation for accuracy and completeness.  I agree with the above.  Haematologist has been used and any errors in dictation or transcription are unintentional.  Hervey Ard, M.D., F.A.C.S.  Robert Bellow 06/18/2017, 7:12 PM

## 2017-06-19 ENCOUNTER — Ambulatory Visit
Admission: RE | Admit: 2017-06-19 | Discharge: 2017-06-19 | Disposition: A | Discharge status: Home / Self Care | Payer: Medicaid Other | Source: Ambulatory Visit | Attending: Radiation Oncology | Admitting: Radiation Oncology

## 2017-06-19 ENCOUNTER — Other Ambulatory Visit: Payer: Self-pay

## 2017-06-19 ENCOUNTER — Telehealth: Payer: Self-pay | Admitting: *Deleted

## 2017-06-19 ENCOUNTER — Encounter: Payer: Self-pay | Admitting: Radiation Oncology

## 2017-06-19 VITALS — Temp 98.0°F | Wt 225.5 lb

## 2017-06-19 DIAGNOSIS — Z800 Family history of malignant neoplasm of digestive organs: Secondary | ICD-10-CM | POA: Insufficient documentation

## 2017-06-19 DIAGNOSIS — E039 Hypothyroidism, unspecified: Secondary | ICD-10-CM | POA: Diagnosis not present

## 2017-06-19 DIAGNOSIS — K219 Gastro-esophageal reflux disease without esophagitis: Secondary | ICD-10-CM | POA: Diagnosis not present

## 2017-06-19 DIAGNOSIS — Z8041 Family history of malignant neoplasm of ovary: Secondary | ICD-10-CM | POA: Insufficient documentation

## 2017-06-19 DIAGNOSIS — M129 Arthropathy, unspecified: Secondary | ICD-10-CM | POA: Diagnosis not present

## 2017-06-19 DIAGNOSIS — Z79899 Other long term (current) drug therapy: Secondary | ICD-10-CM | POA: Insufficient documentation

## 2017-06-19 DIAGNOSIS — Z87891 Personal history of nicotine dependence: Secondary | ICD-10-CM | POA: Insufficient documentation

## 2017-06-19 DIAGNOSIS — F419 Anxiety disorder, unspecified: Secondary | ICD-10-CM | POA: Diagnosis not present

## 2017-06-19 DIAGNOSIS — Z803 Family history of malignant neoplasm of breast: Secondary | ICD-10-CM | POA: Insufficient documentation

## 2017-06-19 DIAGNOSIS — Z170 Estrogen receptor positive status [ER+]: Secondary | ICD-10-CM | POA: Diagnosis not present

## 2017-06-19 DIAGNOSIS — M3500 Sicca syndrome, unspecified: Secondary | ICD-10-CM | POA: Diagnosis not present

## 2017-06-19 DIAGNOSIS — Z801 Family history of malignant neoplasm of trachea, bronchus and lung: Secondary | ICD-10-CM | POA: Insufficient documentation

## 2017-06-19 DIAGNOSIS — C50212 Malignant neoplasm of upper-inner quadrant of left female breast: Secondary | ICD-10-CM | POA: Diagnosis not present

## 2017-06-19 MED ORDER — CEFADROXIL 500 MG PO CAPS: 500.0000 mg | ORAL_CAPSULE | Freq: Two times a day (BID) | ORAL | 0 refills | Status: DC

## 2017-06-19 NOTE — Telephone Encounter (Signed)
Mammosite schedule reviewed with the patient Placement   06-24-17   at [facility]  Scan 06-25-17 Treat Nov 15,16,19,20,21 Aware the [place] will be calling her for more details Aware of ATB and directions reviewed. Aware no showers and to wear her bra while mammosite in place. Pt agrees.

## 2017-06-19 NOTE — Consult Note (Signed)
NEW PATIENT EVALUATION  Name: Sheena Martinez  MRN: [id number]  Date:   06/19/2017     DOB: 09/02/1960   This 56 y.o. female patient presents to the clinic for initial evaluation of stage I (T1 BN 0 M0 ER/PR positive invasive mammary carcinoma of the left breast status post wide local excision and sentinel node biopsy.  REFERRING PHYSICIAN: Soles, Howell Rucks, MD  CHIEF COMPLAINT:  Chief Complaint  Patient presents with  . Breast Cancer    Initial Evaluation    DIAGNOSIS: The encounter diagnosis was Malignant neoplasm of upper-inner quadrant of left breast in female, estrogen receptor positive ([place]).   PREVIOUS INVESTIGATIONS:  Mammogram and ultrasound reviewed Clinical notes reviewed Surgical pathology reports reviewed  HPI: Patient is a 56 year old female who presented with an abnormal mammogram of her left breast and confirmed on ultrasound showing a spiculated lesion at 11:00 position 4 cm from the nipple measuring 7 mm in greatest dimension. She underwent targeted ultrasound which was positive for invasive mammary carcinoma ER/PR positive HER-2/neu not overexpressed. She went on to have a wide local excision and sentinel node biopsy showing 6 mm of overall grade 1 invasive mammary carcinoma with margins clear at 7 mm from the superior margin. One sentinel lymph node was negative for metastatic disease. Tumor is strongly ER/PR positive HER-2/neu not overexpressed. She's been quite sore since completing her surgery mostly maxillary region. MammaPrint has been ordered and is pending. She is seen today for radiation oncology opinion. She has been ultrasound and thought to be a good candidate for accelerated partial breast irradiation.  PLANNED TREATMENT REGIMEN: Left breast accelerated partial breast radiation  PAST MEDICAL HISTORY:  has a past medical history of Anxiety, Arthritis, Breast cancer ([place]) (05/12/2017), GERD (gastroesophageal reflux disease), Hypothyroidism, and Sjogren's  syndrome ([place]).    PAST SURGICAL HISTORY:  Past Surgical History:  Procedure Laterality Date  . BREAST BIOPSY Left 05/12/2017   [place] bx/ INVASIVE MAMMARY CARCINOMA  . CESAREAN SECTION  1995  . COSMETIC SURGERY    . CYST EXCISION  2018   pilar cyst/ Dr Will Bonnet  . TONSILLECTOMY      FAMILY HISTORY: family history includes Anemia in her mother; Breast cancer (age of onset: 23) in her maternal aunt; Diabetes in her mother; Liver disease in her father; Lung cancer in her paternal aunt; Ovarian cancer in her maternal grandmother; Skin cancer in her mother; Stomach cancer in her paternal grandfather and paternal uncle.  SOCIAL HISTORY:  reports that she quit smoking about 37 years ago. Her smoking use included cigarettes. She quit after 7.00 years of use. she has never used smokeless tobacco. She reports that she does not drink alcohol or use drugs.  ALLERGIES: Sulfamethoxazole-trimethoprim  MEDICATIONS:  Current Outpatient Medications  Medication Sig Dispense Refill  . acetaminophen (TYLENOL) 500 MG tablet Take 1,000 mg by mouth every 8 (eight) hours as needed for mild pain or headache.    . Boric Acid POWD by Does not apply route as needed.    . Ca Carbonate-Mag Hydroxide (ROLAIDS PO) Take 1 tablet by mouth as needed.    . Carboxymethylcell-Hypromellose (GENTEAL OP) Apply 1 drop to eye 2 (two) times daily.    Marland Kitchen HYDROcodone-acetaminophen (NORCO) 5-325 MG tablet Take 1-2 tablets by mouth every 4 (four) hours as needed for moderate pain. Take no more than 10 tabs/ day. 30 tablet 0  . ketoconazole (NIZORAL) 2 % shampoo Apply 1 application topically 2 (two) times a week.    Marland Kitchen  meloxicam (MOBIC) 15 MG tablet Take 15 mg by mouth at bedtime.     Marland Kitchen PARoxetine (PAXIL) 20 MG tablet Take 20 mg by mouth at bedtime.     . prednisoLONE sodium phosphate (INFLAMASE FORTE) 1 % ophthalmic solution Place 1 drop into both eyes as needed.     No current facility-administered medications for this encounter.      ECOG PERFORMANCE STATUS:  0 - Asymptomatic  REVIEW OF SYSTEMS:  Patient denies any weight loss, fatigue, weakness, fever, chills or night sweats. Patient denies any loss of vision, blurred vision. Patient denies any ringing  of the ears or hearing loss. No irregular heartbeat. Patient denies heart murmur or history of fainting. Patient denies any chest pain or pain radiating to her upper extremities. Patient denies any shortness of breath, difficulty breathing at night, cough or hemoptysis. Patient denies any swelling in the lower legs. Patient denies any nausea vomiting, vomiting of blood, or coffee ground material in the vomitus. Patient denies any stomach pain. Patient states has had normal bowel movements no significant constipation or diarrhea. Patient denies any dysuria, hematuria or significant nocturia. Patient denies any problems walking, swelling in the joints or loss of balance. Patient denies any skin changes, loss of hair or loss of weight. Patient denies any excessive worrying or anxiety or significant depression. Patient denies any problems with insomnia. Patient denies excessive thirst, polyuria, polydipsia. Patient denies any swollen glands, patient denies easy bruising or easy bleeding. Patient denies any recent infections, allergies or URI. Patient "s visual fields have not changed significantly in recent time.    PHYSICAL EXAM: Temp 98 F (36.7 C)   Wt 225 lb 8.5 oz (102.3 kg)   LMP 05/26/2017 (Approximate)   BMI 44.05 kg/m  Patient is status post wide local excision and sentinel node biopsy incision of her left breast. No dominant mass or nodularity is noted in either breast in 2 positions examined. No axillary or supraclavicular adenopathy is identified. Well-developed well-nourished patient in NAD. HEENT reveals PERLA, EOMI, discs not visualized.  Oral cavity is clear. No oral mucosal lesions are identified. Neck is clear without evidence of cervical or supraclavicular  adenopathy. Lungs are clear to A&P. Cardiac examination is essentially unremarkable with regular rate and rhythm without murmur rub or thrill. Abdomen is benign with no organomegaly or masses noted. Motor sensory and DTR levels are equal and symmetric in the upper and lower extremities. Cranial nerves II through XII are grossly intact. Proprioception is intact. No peripheral adenopathy or edema is identified. No motor or sensory levels are noted. Crude visual fields are within normal range.  LABORATORY DATA: Pathology reports reviewed    RADIOLOGY RESULTS: Mammograms and ultrasound reviewed   IMPRESSION: Stage I ER/PR positive invasive mammary carcinoma the left breast status post wide local excision and sentinel node biopsy in 56 year old female  PLAN: At this time I got over treatment options as far as adjuvant radiation therapy is concerned. I discussed the risks and benefits of both whole breast radiation as well as accelerated partial breast irradiation. Side effects such as skin reaction fatigue alteration of blood counts and persistent thickening of the lumpectomy site with accelerated partial breast radiation were all discussed in detail. Patient is leaning towards going ahead with MammoSite balloon catheter placement. I would plan on delivering 3400 cGy in 10 fractions at 340 C twice a day using high dose rate remote afterloading. We will coordinate with surgeon's office MammoSite balloon placement and follow-up treatment planning. Patient  also will be a candidate for antiestrogen therapy after completion of all treatment. Should her MammaPrint demonstrated need for systemic chemotherapy will be able to sequence that in after completion of her APB I.  I would like to take this opportunity to thank you for allowing me to participate in the care of your patient.Armstead Peaks., MD

## 2017-06-20 ENCOUNTER — Encounter: Payer: Self-pay | Admitting: General Surgery

## 2017-06-23 ENCOUNTER — Telehealth: Payer: Self-pay | Admitting: General Surgery

## 2017-06-23 ENCOUNTER — Other Ambulatory Visit: Payer: Self-pay | Admitting: General Surgery

## 2017-06-23 NOTE — Telephone Encounter (Signed)
Was notified that her Mammoprint results showed her to be at high risk for recurrent disease without adjuvant chemotherapy.  This information has been forwarded to Dr. Janese Banks in medical oncology.  The patient will be contacted by medical oncology to arrange a discussion regarding the pros and cons of adjuvant treatment.  This information does not impact plans for MammoSite balloon placement scheduled for tomorrow.  The patient has been reminded to take her Duricef dose about 1 hour before surgery, about the time she leaves the house to come to the day surgery procedure.

## 2017-06-23 NOTE — Telephone Encounter (Signed)
This patient has seen Dr. Tasia Catchings in the past and I will forward this message to her.  Thanks, Astrid Divine

## 2017-06-23 NOTE — Telephone Encounter (Signed)
I have already talked to Webb Silversmith who has talked to patient already.  She can proceed with mammosite and will have appointment this week to talk about adjuvant chemo.  Thank you.   Talbert Cage

## 2017-06-24 ENCOUNTER — Encounter: Payer: Self-pay | Admitting: Certified Registered Nurse Anesthetist

## 2017-06-24 ENCOUNTER — Ambulatory Visit
Admission: RE | Admit: 2017-06-24 | Discharge: 2017-06-24 | Disposition: A | Discharge status: Home / Self Care | Payer: Medicaid Other | Source: Ambulatory Visit | Attending: General Surgery | Admitting: General Surgery

## 2017-06-24 ENCOUNTER — Encounter: Payer: Self-pay | Admitting: *Deleted

## 2017-06-24 ENCOUNTER — Encounter
Admission: RE | Disposition: A | Discharge status: Home / Self Care | Payer: Self-pay | Source: Ambulatory Visit | Attending: General Surgery

## 2017-06-24 DIAGNOSIS — Z87891 Personal history of nicotine dependence: Secondary | ICD-10-CM | POA: Diagnosis not present

## 2017-06-24 DIAGNOSIS — C50412 Malignant neoplasm of upper-outer quadrant of left female breast: Secondary | ICD-10-CM | POA: Diagnosis not present

## 2017-06-24 DIAGNOSIS — C50212 Malignant neoplasm of upper-inner quadrant of left female breast: Secondary | ICD-10-CM | POA: Insufficient documentation

## 2017-06-24 DIAGNOSIS — Z170 Estrogen receptor positive status [ER+]: Secondary | ICD-10-CM | POA: Insufficient documentation

## 2017-06-24 DIAGNOSIS — C50912 Malignant neoplasm of unspecified site of left female breast: Secondary | ICD-10-CM | POA: Diagnosis present

## 2017-06-24 HISTORY — PX: BREAST MAMMOSITE: SHX5264

## 2017-06-24 SURGERY — MAMMOSITE BREAST
Anesthesia: LOCAL | Laterality: Left

## 2017-06-24 MED ORDER — SODIUM CHLORIDE 0.9 % IJ SOLN
INTRAMUSCULAR | Status: AC
  Filled 2017-06-24: qty 100

## 2017-06-24 MED ORDER — LIDOCAINE-EPINEPHRINE (PF) 1 %-1:200000 IJ SOLN
INTRAMUSCULAR | Status: DC | PRN
  Administered 2017-06-24: 20 mL

## 2017-06-24 MED ORDER — FAMOTIDINE 20 MG PO TABS: 20.0000 mg | ORAL_TABLET | Freq: Once | ORAL | Status: DC

## 2017-06-24 MED ORDER — BACITRACIN ZINC 500 UNIT/GM EX OINT
TOPICAL_OINTMENT | CUTANEOUS | Status: AC
  Filled 2017-06-24: qty 28.35

## 2017-06-24 MED ORDER — IOPAMIDOL (ISOVUE-M 200) INJECTION 41%
INTRAMUSCULAR | Status: DC | PRN
  Administered 2017-06-24: 10 mL

## 2017-06-24 MED ORDER — SODIUM CHLORIDE 0.9 % IJ SOLN
INTRAMUSCULAR | Status: DC | PRN
  Administered 2017-06-24: 70 mL via INTRAVENOUS

## 2017-06-24 MED ORDER — LACTATED RINGERS IV SOLN: INTRAVENOUS | Status: DC

## 2017-06-24 MED ORDER — LIDOCAINE-EPINEPHRINE (PF) 1 %-1:200000 IJ SOLN
INTRAMUSCULAR | Status: AC
  Filled 2017-06-24: qty 30

## 2017-06-24 SURGICAL SUPPLY — 38 items
BASIN GRAD PLASTIC 32OZ STRL (MISCELLANEOUS) ×2 IMPLANT
BLADE SURG 15 STRL SS SAFETY (BLADE) ×2 IMPLANT
CANISTER SUCT 1200ML W/VALVE (MISCELLANEOUS) ×2 IMPLANT
CHLORAPREP W/TINT 26ML (MISCELLANEOUS) ×2 IMPLANT
CNTNR SPEC 2.5X3XGRAD LEK (MISCELLANEOUS) ×1
CONT SPEC 4OZ STER OR WHT (MISCELLANEOUS) ×1
CONTAINER SPEC 2.5X3XGRAD LEK (MISCELLANEOUS) ×1 IMPLANT
COVER PROBE FLX POLY STRL (MISCELLANEOUS) ×2 IMPLANT
DEVICE CAVITY EVALUATION 9031 (MISCELLANEOUS) ×2 IMPLANT
DEVICE DUBIN SPECIMEN MAMMOGRA (MISCELLANEOUS) IMPLANT
DRAPE LAPAROTOMY 100X77 ABD (DRAPES) ×2 IMPLANT
DRSG TELFA 4X3 1S NADH ST (GAUZE/BANDAGES/DRESSINGS) ×2 IMPLANT
ELECT REM PT RETURN 9FT ADLT (ELECTROSURGICAL) ×2
ELECTRODE REM PT RTRN 9FT ADLT (ELECTROSURGICAL) ×1 IMPLANT
GAUZE SPONGE 4X4 12PLY STRL (GAUZE/BANDAGES/DRESSINGS) ×2 IMPLANT
GLOVE BIO SURGEON STRL SZ7.5 (GLOVE) ×2 IMPLANT
GLOVE INDICATOR 8.0 STRL GRN (GLOVE) ×2 IMPLANT
GOWN STRL REUS W/ TWL LRG LVL3 (GOWN DISPOSABLE) ×2 IMPLANT
GOWN STRL REUS W/TWL LRG LVL3 (GOWN DISPOSABLE) ×2
KIT RM TURNOVER STRD PROC AR (KITS) ×2 IMPLANT
LABEL OR SOLS (LABEL) ×2 IMPLANT
MARGIN MAP 10MM (MISCELLANEOUS) ×2 IMPLANT
NDL SAFETY 22GX1.5 (NEEDLE) ×2 IMPLANT
NEEDLE HYPO 25GX1X1/2 BEV (NEEDLE) ×2 IMPLANT
NEEDLE HYPO 25X1 1.5 SAFETY (NEEDLE) ×2 IMPLANT
NS IRRIG 500ML POUR BTL (IV SOLUTION) ×2 IMPLANT
PACK BASIN MINOR ARMC (MISCELLANEOUS) ×2 IMPLANT
SHEARS HARMONIC 9CM CVD (BLADE) IMPLANT
STRIP CLOSURE SKIN 1/2X4 (GAUZE/BANDAGES/DRESSINGS) ×2 IMPLANT
SUT ETHILON 3-0 FS-10 30 BLK (SUTURE) ×2
SUT VIC AB 2-0 CT1 27 (SUTURE)
SUT VIC AB 2-0 CT1 TAPERPNT 27 (SUTURE) IMPLANT
SUT VIC AB 4-0 FS2 27 (SUTURE) IMPLANT
SUTURE EHLN 3-0 FS-10 30 BLK (SUTURE) ×1 IMPLANT
SWABSTK COMLB BENZOIN TINCTURE (MISCELLANEOUS) ×2 IMPLANT
SYR CONTROL 10ML (SYRINGE) ×2 IMPLANT
TOWEL OR 17X26 4PK STRL BLUE (TOWEL DISPOSABLE) ×2 IMPLANT
TRAY MAMMOSITE APPLI 4 5 CM (KITS) ×2 IMPLANT

## 2017-06-24 NOTE — H&P (Signed)
Patient is a candidate for partial breast radiation of her cancer. For Mammosite balloon placement.

## 2017-06-24 NOTE — Op Note (Signed)
Preoperative diagnosis: Left breast cancer.  Postoperative diagnosis: Same.  Operative procedure: Placement MammoSite balloon for accelerated partial breast radiation.  Operating Surgeon: Hervey Ard, MD.  Anesthesia: 20 cc of 1% Xylocaine with 1-200,000 units of epinephrine.  Estimate blood loss: None.  Clinical note: This 56 year old woman has been found to be a candidate for accelerated partial breast radiation.  MammoSite balloon placement is planned to allow this to be administered.  The patient made use of Duricef 500 mg prior to presentation to surgery.  Operative note: The patient was placed in a semierect position and the breast cleansed with ChloraPrep and draped.  Ultrasound was used to identify the seroma cavity deep in the breast.  Local anesthetic was instilled.  An 8 mm trocar was placed to drain the seroma cavity of approximately 15-20 cc of straw-colored fluid.  A cavity evaluation device was placed and inflated with 60 cc of injectable saline.  This showed a spherical insufflation and a distance of 2.23 cm to the skin.  At the treatment balloon was examined and inflated to 70 cc with a mixture of saline and Omnipaque.  This showed spherical insufflation and no evidence of leakage.  The cavity evaluation device was removed and the treatment balloon placed without incident.  This was inflated to a 60 cc volume.  Ultrasound showed this to be spherically insufflated and the distance of the skin greater than 2 cm.  The patient tolerated insufflation without discomfort.  Bacitracin ointment was applied to the skin exit site followed by a bulky dressing.  The patient was taken to day surgery recovery in good condition.

## 2017-06-24 NOTE — Discharge Instructions (Signed)
AMBULATORY SURGERY  DISCHARGE INSTRUCTIONS   1) The drugs that you were given will stay in your system until tomorrow so for the next 24 hours you should not:  A) Drive an automobile B) Make any legal decisions C) Drink any alcoholic beverage   2) You may resume regular meals tomorrow.  Today it is better to start with liquids and gradually work up to solid foods.  You may eat anything you prefer, but it is better to start with liquids, then soup and crackers, and gradually work up to solid foods.   3) Please notify your doctor immediately if you have any unusual bleeding, trouble breathing, redness and pain at the surgery site, drainage, fever, or pain not relieved by medication.    4) Additional Instructions:  Follow up in 2 weeks        Please contact your physician with any problems or Same Day Surgery at [phone], Monday through Friday 6 am to 4 pm, or [place] at [facility] number at [phone].

## 2017-06-25 ENCOUNTER — Inpatient Hospital Stay: Payer: Medicaid Other | Attending: Oncology | Admitting: Oncology

## 2017-06-25 ENCOUNTER — Encounter: Payer: Self-pay | Admitting: General Surgery

## 2017-06-25 ENCOUNTER — Other Ambulatory Visit: Payer: Self-pay

## 2017-06-25 ENCOUNTER — Telehealth: Payer: Self-pay | Admitting: *Deleted

## 2017-06-25 ENCOUNTER — Ambulatory Visit
Admission: RE | Admit: 2017-06-25 | Discharge: 2017-06-25 | Disposition: A | Discharge status: Home / Self Care | Payer: Medicaid Other | Source: Ambulatory Visit | Attending: Radiation Oncology | Admitting: Radiation Oncology

## 2017-06-25 ENCOUNTER — Other Ambulatory Visit: Payer: Self-pay | Admitting: General Surgery

## 2017-06-25 ENCOUNTER — Inpatient Hospital Stay: Payer: Medicaid Other

## 2017-06-25 VITALS — BP 137/76 | HR 75 | Temp 98.0°F | Resp 16 | Wt 227.3 lb

## 2017-06-25 DIAGNOSIS — Z803 Family history of malignant neoplasm of breast: Secondary | ICD-10-CM | POA: Diagnosis not present

## 2017-06-25 DIAGNOSIS — F419 Anxiety disorder, unspecified: Secondary | ICD-10-CM | POA: Diagnosis not present

## 2017-06-25 DIAGNOSIS — Z808 Family history of malignant neoplasm of other organs or systems: Secondary | ICD-10-CM | POA: Diagnosis not present

## 2017-06-25 DIAGNOSIS — Z8041 Family history of malignant neoplasm of ovary: Secondary | ICD-10-CM | POA: Insufficient documentation

## 2017-06-25 DIAGNOSIS — Z170 Estrogen receptor positive status [ER+]: Secondary | ICD-10-CM

## 2017-06-25 DIAGNOSIS — Z801 Family history of malignant neoplasm of trachea, bronchus and lung: Secondary | ICD-10-CM | POA: Diagnosis not present

## 2017-06-25 DIAGNOSIS — M3500 Sicca syndrome, unspecified: Secondary | ICD-10-CM | POA: Diagnosis not present

## 2017-06-25 DIAGNOSIS — Z5111 Encounter for antineoplastic chemotherapy: Secondary | ICD-10-CM | POA: Diagnosis present

## 2017-06-25 DIAGNOSIS — E039 Hypothyroidism, unspecified: Secondary | ICD-10-CM | POA: Insufficient documentation

## 2017-06-25 DIAGNOSIS — Z510 Encounter for antineoplastic radiation therapy: Secondary | ICD-10-CM | POA: Diagnosis present

## 2017-06-25 DIAGNOSIS — Z87891 Personal history of nicotine dependence: Secondary | ICD-10-CM | POA: Diagnosis not present

## 2017-06-25 DIAGNOSIS — Z79899 Other long term (current) drug therapy: Secondary | ICD-10-CM | POA: Insufficient documentation

## 2017-06-25 DIAGNOSIS — C50212 Malignant neoplasm of upper-inner quadrant of left female breast: Secondary | ICD-10-CM | POA: Diagnosis not present

## 2017-06-25 DIAGNOSIS — K219 Gastro-esophageal reflux disease without esophagitis: Secondary | ICD-10-CM | POA: Insufficient documentation

## 2017-06-25 DIAGNOSIS — C50412 Malignant neoplasm of upper-outer quadrant of left female breast: Secondary | ICD-10-CM

## 2017-06-25 DIAGNOSIS — E282 Polycystic ovarian syndrome: Secondary | ICD-10-CM | POA: Diagnosis not present

## 2017-06-25 DIAGNOSIS — Z800 Family history of malignant neoplasm of digestive organs: Secondary | ICD-10-CM | POA: Diagnosis not present

## 2017-06-25 LAB — CBC WITH DIFFERENTIAL/PLATELET
BASOS ABS: 0 10*3/uL (ref 0–0.1)
Basophils Relative: 1 %
EOS ABS: 0.1 10*3/uL (ref 0–0.7)
Eosinophils Relative: 2 %
HCT: 40.2 % (ref 35.0–47.0)
HEMOGLOBIN: 13.6 g/dL (ref 12.0–16.0)
LYMPHS ABS: 2.6 10*3/uL (ref 1.0–3.6)
Lymphocytes Relative: 36 %
MCH: 31.2 pg (ref 26.0–34.0)
MCHC: 33.9 g/dL (ref 32.0–36.0)
MCV: 91.9 fL (ref 80.0–100.0)
Monocytes Absolute: 0.8 10*3/uL (ref 0.2–0.9)
Monocytes Relative: 12 %
NEUTROS PCT: 49 %
Neutro Abs: 3.6 10*3/uL (ref 1.4–6.5)
Platelets: 339 10*3/uL (ref 150–440)
RBC: 4.37 MIL/uL (ref 3.80–5.20)
RDW: 13.8 % (ref 11.5–14.5)
WBC: 7.3 10*3/uL (ref 3.6–11.0)

## 2017-06-25 LAB — COMPREHENSIVE METABOLIC PANEL
ALBUMIN: 3.6 g/dL (ref 3.5–5.0)
ALK PHOS: 103 U/L (ref 38–126)
ALT: 21 U/L (ref 14–54)
ANION GAP: 5 (ref 5–15)
AST: 19 U/L (ref 15–41)
BILIRUBIN TOTAL: 0.4 mg/dL (ref 0.3–1.2)
BUN: 17 mg/dL (ref 6–20)
CALCIUM: 9.4 mg/dL (ref 8.9–10.3)
CO2: 27 mmol/L (ref 22–32)
CREATININE: 0.62 mg/dL (ref 0.44–1.00)
Chloride: 103 mmol/L (ref 101–111)
GFR calc Af Amer: >60 mL/min (ref >60)
GFR calc non Af Amer: >60 mL/min (ref >60)
GLUCOSE: 81 mg/dL (ref 65–99)
POTASSIUM: 4.5 mmol/L (ref 3.5–5.1)
SODIUM: 135 mmol/L (ref 135–145)
Total Protein: 7 g/dL (ref 6.5–8.1)

## 2017-06-25 NOTE — Progress Notes (Signed)
Patient here today for follow up.   

## 2017-06-25 NOTE — Progress Notes (Signed)
Hematology/Oncology Consult note [facility] Telephone:([phone] [phone]   Patient Care Team: Herminio Commons, MD as PCP - General (Family Medicine) Rico Junker, RN as Registered Nurse Theodore Demark, RN as Registered Nurse  REFERRING PROVIDER: Herminio Commons, MD REASON FOR VISIT Follow up for treatment of  Breast cancer  HISTORY OF PRESENTING ILLNESS:  Sheena Martinez is a @ 56 y.o.  female with PMH listed below who was referred to me for evaluation of newly diagnosed breast cancer. Patient reports that she is been doing fine and she underwent screening annual mammogram which discovered abdominal findings.09/19/2018patient underwent screening, bilateral mammogram warrant further evaluation.on 05/12/2017, patient underwent diagnostic mammogram which discoveredleft breast irregular mass was spiculated margins in the upper slightly inner left breast. Ultrasound of the superior left breast showed hypoechoic mass with spiculated margins at 11:00, 4 cm from the nipple measuring 574 mm. Patient underwent biopsy of the left breast mass, pathology showed invasive mammary carcinoma. ER/PR >+90%, HER-2 IHC +2 equivocal, fish negative. Patient has been evaluated by Dr. Bary Castilla this week. Patient presented to the clinic today, accomplished by her daughter-in-law to establish care. Patient overall reports feeling well. She has a positive family history of breast cancer in her maternal aunt. 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 She does not have insurance pending applying Medicaid. She has a history of Sjogren disease for which she has had tear duct surgery. She also reports history of PCOS with elevated testosterone level. She is perimenopausal, her last menstrual period reported as 3 months ago, last for about 3 months. Denies any hormonal replacement therapy.    INTERVAL HISTORY Patient had lumpectomy on  06/10/2017, she presented today to the clinic to follow-up for the management of breast cancer. Pathology revealed a 6 mm ER PR positive, HER-2 negative left invasive mammary carcinoma, margin is negative, no LV I. Pathological stage and pT1a N0. Mammoprintr was sent during intervaland came back with 10 year recurrence rate at 29% high risk group. Patient has been seen by radiation oncologist and surgery for MammoSite.  Patient overall reports well. Denies any shortness of breath, bone pain, lower extremity swelling.  Review of Systems  Constitutional: Negative for fever.  Eyes: Negative for blurred vision.  Respiratory: Negative for cough.   Cardiovascular: Negative for chest pain.  Gastrointestinal: Negative for heartburn.  Genitourinary: Negative for dysuria.  Musculoskeletal: Negative for myalgias.  Skin: Negative for rash.  Neurological: Negative for dizziness.  Endo/Heme/Allergies: Does not bruise/bleed easily.   MEDICAL HISTORY:  Past Medical History:  Diagnosis Date  . Anxiety   . Arthritis   . Breast cancer ([place]) 05/12/2017   INVASIVE MAMMARY CARCINOMA ER/PR positive LEFT BREAST UPPER inner  QUAD  . GERD (gastroesophageal reflux disease)    OCC  . Hypothyroidism   . Sjogren's syndrome ([place])     SURGICAL HISTORY: Past Surgical History:  Procedure Laterality Date  . BREAST BIOPSY Left 05/12/2017   [place] bx/ INVASIVE MAMMARY CARCINOMA  . CESAREAN SECTION  1995  . COSMETIC SURGERY    . CYST EXCISION  2018   pilar cyst/ Dr Will Bonnet  . TONSILLECTOMY      SOCIAL HISTORY: Social History   Socioeconomic History  . Marital status: Married    Spouse name: Not on file  . Number of children: Not on file  . Years of education: Not on file  . Highest education level: Not  on file  Social Needs  . Financial resource strain: Not on file  . Food insecurity - worry: Never true  . Food insecurity - inability: Never true  . Transportation needs - medical: No  . Transportation  needs - non-medical: No  Occupational History  . Not on file  Tobacco Use  . Smoking status: Former Smoker    Years: 7.00    Types: Cigarettes    Last attempt to quit: 08/13/1979    Years since quitting: 37.8  . Smokeless tobacco: Never Used  . Tobacco comment: AGE [age]  Substance and Sexual Activity  . Alcohol use: No  . Drug use: No  . Sexual activity: Yes  Other Topics Concern  . Not on file  Social History Narrative  . Not on file    FAMILY HISTORY: Family History  Problem Relation Age of Onset  . Breast cancer Maternal Aunt 64  . Diabetes Mother   . Skin cancer Mother   . Anemia Mother   . Liver disease Father   . Lung cancer Paternal Aunt   . Stomach cancer Paternal Uncle   . Ovarian cancer Maternal Grandmother   . Stomach cancer Paternal Grandfather     ALLERGIES:  is allergic to sulfamethoxazole-trimethoprim.  MEDICATIONS:  Current Outpatient Medications  Medication Sig Dispense Refill  . acetaminophen (TYLENOL) 500 MG tablet Take 1,000 mg by mouth every 8 (eight) hours as needed for mild pain or headache.    . Boric Acid POWD by Does not apply route as needed.    . Ca Carbonate-Mag Hydroxide (ROLAIDS PO) Take 1 tablet by mouth as needed.    . Carboxymethylcell-Hypromellose (GENTEAL OP) Apply 1 drop to eye 2 (two) times daily.    . cefadroxil (DURICEF) 500 MG capsule Take 1 capsule (500 mg total) 2 (two) times daily by mouth. Start one hour before [facility] procedure on 06-24-17 24 capsule 0  . HYDROcodone-acetaminophen (NORCO) 5-325 MG tablet Take 1-2 tablets by mouth every 4 (four) hours as needed for moderate pain. Take no more than 10 tabs/ day. 30 tablet 0  . ketoconazole (NIZORAL) 2 % shampoo Apply 1 application topically 2 (two) times a week.    . meloxicam (MOBIC) 15 MG tablet Take 15 mg by mouth at bedtime.     Marland Kitchen PARoxetine (PAXIL) 20 MG tablet Take 20 mg by mouth at bedtime.     . prednisoLONE sodium phosphate (INFLAMASE FORTE) 1 % ophthalmic solution  Place 1 drop into both eyes as needed.     No current facility-administered medications for this visit.       Marland Kitchen  PHYSICAL EXAMINATION: ECOG PERFORMANCE STATUS: 0 - Asymptomatic Vitals:   06/25/17 1145  BP: 137/76  Pulse: 75  Resp: 16  Temp: 98 F (36.7 C)   Filed Weights   06/25/17 1145  Weight: 227 lb 5 oz (103.1 kg)   GENERAL: No distress, well nourished.  SKIN:  No rashes or significant lesions  HEAD: Normocephalic, No masses, lesions, tenderness or abnormalities  EYES: Conjunctiva are pink, non icteric ENT: External ears normal ,lips , buccal mucosa, and tongue normal and mucous membranes are moist  LYMPH: No palpable cervical and axillary lymphadenopathy  LUNGS: Clear to auscultation, no crackles or wheezes HEART: Regular rate & rhythm, no murmurs, no gallops, S1 normal and S2 normal  ABDOMEN: Abdomen soft, non-tender, normal bowel sounds, I did not appreciate any  masses or organomegaly  MUSCULOSKELETAL: No CVA tenderness and no tenderness on percussion of the  back or rib cage.  EXTREMITIES: No edema, no skin discoloration or tenderness NEURO: Alert & oriented, no focal motor/sensory deficits. Breast exam Deferred today as patient was already for MammoSite.   LABORATORY DATA:  I have reviewed the data as listed: no recent labs.  Lab Results  Component Value Date   WBC 7.3 06/25/2017   HGB 13.6 06/25/2017   HCT 40.2 06/25/2017   MCV 91.9 06/25/2017   PLT 339 06/25/2017   Recent Labs    06/25/17 1235  NA 135  K 4.5  CL 103  CO2 27  GLUCOSE 81  BUN 17  CREATININE 0.62  CALCIUM 9.4  GFRNONAA >60  GFRAA >60  PROT 7.0  ALBUMIN 3.6  AST 19  ALT 21  ALKPHOS 103  BILITOT 0.4    RADIOGRAPHIC STUDIES: I have personally reviewed the radiological images as listed and agreed with the findings in the report. Mammogram digital diagnostic 05/12/2017 FINDINGS: There is an irregular mass with spiculated margins in the upper, slightly inner left breast,  corresponding to the abnormality seen on screening mammogram.  Mammographic images were processed with CAD.  Targeted ultrasound of the superior left breast was performed demonstrating an irregular, hypoechoic mass with spiculated margins at 11 o'clock, 4 cm from the nipple measuring approximately 5 x 7 x 4 mm, corresponding to the mass seen mammographically. Targeted ultrasound of the left axilla demonstrates no suspicious appearing axillary lymph nodes.  IMPRESSION: Indeterminate left breast mass.  RECOMMENDATION: Ultrasound-guided left breast biopsy.  ASSESSMENT & PLAN:  Cancer Staging Malignant neoplasm of upper-inner quadrant of left breast in female, estrogen receptor positive ([place]) Staging form: Breast, AJCC 8th Edition - Clinical stage from 05/22/2017: Stage IA (cT1, cN0, cM0, G1, ER: Positive, PR: Positive, HER2: Negative) - Signed by Earlie Server, MD on 05/22/2017  1. Malignant neoplasm of upper-inner quadrant of left breast in female, estrogen receptor positive ([place])     #pathology results and mammoprint results were discussed with patient. Given the clinical low risk, and high genomic risk, we will offer adjuvant chemotherapy with Taxotere and Cytoxan for 4 cycles. I explained to the patient the risks and benefits of chemotherapy including all but not limited to hair loss, mouth sore, nausea, vomiting, low blood counts, bleeding, and risk of life threatening infection and even death, secondary malignancy etc.   # Chemotherapy education; port placement.  # referred to genetic counseling for genetic testing.  Antiemetics-Zofran and Compazine; EMLA cream sent to pharmacy # peri-menopausal status: will offer tamoxifen after she finishes Adjuvant chemotherapy # we'll offer adjuvant Zometa for 6 cycles.  Return of visit: 2 weeks Lab/MD/ TC Zometa.   Thank you for this kind referral and the opportunity to participate in the care of this patient. A copy of today's note is  routed to referring provider    Earlie Server, MD, PhD Hematology Oncology [facility] at [facility] Pager- [phone] 06/25/2017

## 2017-06-25 NOTE — Progress Notes (Signed)
  Oncology Nurse Navigator Documentation  Navigator Location: [facility]-Med Onc (06/25/17 1500)   )Navigator Encounter Type: Clinic/MDC (06/25/17 1500)                     Patient Visit Type: MedOnc (06/25/17 1500) Treatment Phase: Active Tx (06/25/17 1500) Barriers/Navigation Needs: Coordination of Care;Education (06/25/17 1500)   Interventions: Coordination of Care;Education (06/25/17 1500)                      Time Spent with Patient: 60 (06/25/17 1500)   Supported patient and husband when reviewing Mammoprint results with Dr. Tasia Catchings. , and planning chemotherapy.  Patient is receiving her 1st Mammosite treatment today.  Scheduled Chemothearpy class tomorrow.  Genetic testing/consult request sent to [facility].

## 2017-06-25 NOTE — Telephone Encounter (Signed)
Patient was contacted today about scheduling port placement.   Surgery will be scheduled for Tuesday, 07-08-17 at [facility] with Dr. Bary Castilla.   The patient states that she is in the doctor's office and wishes to call back about details later this afternoon.

## 2017-06-25 NOTE — Telephone Encounter (Signed)
Patient called the office back and surgery instructions were reviewed.   The patient was instructed to call the office if she has further questions.

## 2017-06-25 NOTE — Patient Instructions (Signed)

## 2017-06-25 NOTE — Progress Notes (Signed)
START ON PATHWAY REGIMEN - Breast     A cycle is every 21 days:     Docetaxel      Cyclophosphamide   **Always confirm dose/schedule in your pharmacy ordering system**    Patient Characteristics: Postoperative without Neoadjuvant Therapy (Pathologic Staging), Invasive Disease, Adjuvant Therapy, HER2 Negative/Unknown/Equivocal, ER Positive, Node Negative, pT1a-c, pN0/N50m or pT2 or Higher, pN0, MammaPrint(R), High Genomic Risk Therapeutic Status: Postoperative without Neoadjuvant Therapy (Pathologic Staging) AJCC Grade: GX AJCC N Category: pNX AJCC M Category: cM0 ER Status: Positive (+) AJCC 8 Stage Grouping: IA HER2 Status: Negative (-) Oncotype Dx Recurrence Score: Ordered Other Genomic Test AJCC T Category: pTX PR Status: Positive (+) Has this patient completed genomic testing<= Yes - MammaPrint(R) MammaPrint(R) Score: High Genomic Risk Intent of Therapy: Curative Intent, Discussed with Patient

## 2017-06-26 ENCOUNTER — Inpatient Hospital Stay: Payer: Medicaid Other

## 2017-06-26 ENCOUNTER — Ambulatory Visit
Admission: RE | Admit: 2017-06-26 | Discharge: 2017-06-26 | Disposition: A | Discharge status: Home / Self Care | Payer: Medicaid Other | Source: Ambulatory Visit | Attending: Radiation Oncology | Admitting: Radiation Oncology

## 2017-06-26 ENCOUNTER — Telehealth: Payer: Self-pay | Admitting: Genetic Counselor

## 2017-06-26 DIAGNOSIS — Z510 Encounter for antineoplastic radiation therapy: Secondary | ICD-10-CM | POA: Diagnosis not present

## 2017-06-26 NOTE — Telephone Encounter (Signed)
Sheena Martinez was referred for genetic counseling by Dr. Tasia Catchings due to a personal history of breast cancer.  She is scheduled for this telegenetics visit to be done by phone on Monday 06/30/17 at 1pm and will get her blood drawn after the appointment.   Steele Berg, [place], [place] Genetic Counselor Phone: [phone]

## 2017-06-27 ENCOUNTER — Inpatient Hospital Stay: Admission: RE | Admit: 2017-06-27 | Payer: Medicaid Other | Source: Ambulatory Visit

## 2017-06-27 ENCOUNTER — Ambulatory Visit
Admission: RE | Admit: 2017-06-27 | Discharge: 2017-06-27 | Disposition: A | Discharge status: Home / Self Care | Payer: Medicaid Other | Source: Ambulatory Visit | Attending: Radiation Oncology | Admitting: Radiation Oncology

## 2017-06-27 DIAGNOSIS — Z510 Encounter for antineoplastic radiation therapy: Secondary | ICD-10-CM | POA: Diagnosis not present

## 2017-06-30 ENCOUNTER — Telehealth: Payer: Self-pay | Admitting: Genetic Counselor

## 2017-06-30 ENCOUNTER — Encounter: Payer: Self-pay | Admitting: Genetic Counselor

## 2017-06-30 ENCOUNTER — Ambulatory Visit
Admission: RE | Admit: 2017-06-30 | Discharge: 2017-06-30 | Disposition: A | Discharge status: Home / Self Care | Payer: Medicaid Other | Source: Ambulatory Visit | Attending: Radiation Oncology | Admitting: Radiation Oncology

## 2017-06-30 ENCOUNTER — Institutional Professional Consult (permissible substitution): Payer: Medicaid Other | Admitting: Radiation Oncology

## 2017-06-30 ENCOUNTER — Inpatient Hospital Stay: Payer: Medicaid Other

## 2017-06-30 ENCOUNTER — Inpatient Hospital Stay: Payer: Medicaid Other | Admitting: Oncology

## 2017-06-30 DIAGNOSIS — Z510 Encounter for antineoplastic radiation therapy: Secondary | ICD-10-CM | POA: Diagnosis not present

## 2017-06-30 DIAGNOSIS — C50212 Malignant neoplasm of upper-inner quadrant of left female breast: Secondary | ICD-10-CM

## 2017-06-30 DIAGNOSIS — Z170 Estrogen receptor positive status [ER+]: Secondary | ICD-10-CM

## 2017-06-30 DIAGNOSIS — Z5111 Encounter for antineoplastic chemotherapy: Secondary | ICD-10-CM | POA: Diagnosis not present

## 2017-06-30 LAB — CBC WITH DIFFERENTIAL/PLATELET
Basophils Absolute: 0 10*3/uL (ref 0–0.1)
Basophils Relative: 0 %
EOS ABS: 0.1 10*3/uL (ref 0–0.7)
Eosinophils Relative: 2 %
HEMATOCRIT: 38.4 % (ref 35.0–47.0)
HEMOGLOBIN: 13 g/dL (ref 12.0–16.0)
LYMPHS ABS: 2.1 10*3/uL (ref 1.0–3.6)
Lymphocytes Relative: 41 %
MCH: 31 pg (ref 26.0–34.0)
MCHC: 33.8 g/dL (ref 32.0–36.0)
MCV: 91.7 fL (ref 80.0–100.0)
MONOS PCT: 12 %
Monocytes Absolute: 0.6 10*3/uL (ref 0.2–0.9)
NEUTROS ABS: 2.3 10*3/uL (ref 1.4–6.5)
NEUTROS PCT: 45 %
Platelets: 363 10*3/uL (ref 150–440)
RBC: 4.19 MIL/uL (ref 3.80–5.20)
RDW: 13.9 % (ref 11.5–14.5)
WBC: 5.1 10*3/uL (ref 3.6–11.0)

## 2017-06-30 LAB — COMPREHENSIVE METABOLIC PANEL
ALBUMIN: 3.5 g/dL (ref 3.5–5.0)
ALT: 17 U/L (ref 14–54)
AST: 18 U/L (ref 15–41)
Alkaline Phosphatase: 97 U/L (ref 38–126)
Anion gap: 6 (ref 5–15)
BUN: 21 mg/dL — ABNORMAL HIGH (ref 6–20)
CO2: 27 mmol/L (ref 22–32)
CREATININE: 0.7 mg/dL (ref 0.44–1.00)
Calcium: 9.2 mg/dL (ref 8.9–10.3)
Chloride: 103 mmol/L (ref 101–111)
GFR calc non Af Amer: >60 mL/min (ref >60)
GLUCOSE: 93 mg/dL (ref 65–99)
Potassium: 4.3 mmol/L (ref 3.5–5.1)
SODIUM: 136 mmol/L (ref 135–145)
Total Bilirubin: 0.4 mg/dL (ref 0.3–1.2)
Total Protein: 7 g/dL (ref 6.5–8.1)

## 2017-06-30 NOTE — Telephone Encounter (Signed)
Cancer Genetics            Telegenetics Initial Visit    Patient Name: Sheena Martinez Patient DOB: Mar 23, 1961 Patient Age: 56 y.o. Phone Call Date: 06/30/2017  Referring Provider: Earlie Server, MD  Reason for Visit: Evaluate for hereditary susceptibility to cancer    Assessment and Plan:  . Sheena Martinez's history of breast and ovarian cancers is somewhat suggestive of a hereditary predisposition to cancer even though she was not diagnosed with breast cancer at a young age. She meets NCCN criteria for genetic testing.  . Testing is recommended to determine whether she has a pathogenic mutation that will impact her screening and risk-reduction for cancer. A negative result will be reassuring.  . Sheena Martinez wished to pursue genetic testing and is scheduled for a lab visit for a blood draw today. Analysis will include the 47 genes on [facility]'s Common Cancers panel (APC, ATM, AXIN2, BARD1, BMPR1A, BRCA1, BRCA2, BRIP1, CDH1, CDK4, CDKN2A, CHEK2, CTNNA1, DICER1, EPCAM, GREM1, HOXB13, KIT, MEN1, MLH1, MSH2, MSH3, MSH6, MUTYH, NBN, NF1, NTHL1, PALB2, PDGFRA, PMS2, POLD1, POLE, PTEN, RAD50, RAD51C, RAD51D, SDHA, SDHB, SDHC, SDHD, SMAD4, SMARCA4, STK11, TP53, TSC1, TSC2, VHL).   . Results should be available in approximately 2-3 weeks, at which point we will contact her and address implications for her as well as address genetic testing for at-risk family members, if needed.      Dr. Grayland Ormond was available for questions concerning this case. Total time spent by counseling by phone was approximately 25 minutes.   _____________________________________________________________________   History of Present Illness: [name], a 56 y.o. female, was referred for genetic counseling to discuss the possibility of a hereditary predisposition to cancer and discuss whether genetic testing is warranted. This was a telegenetics visit via phone.  Sheena Martinez was diagnosed with breast  cancer at the age of 58. She is s/p lumpectomy and is currently receiving radiation. She will also be receiving chemotherapy.  The breast tumor was ER positive, PR positive, and HER2 negative.  Sheena Martinez indicated that she has not yet initiate colon cancer screenings as is recommended for anyone in the general population by age 41.    Past Medical History:  Diagnosis Date  . Anxiety   . Arthritis   . Breast cancer ([place]) 05/12/2017   INVASIVE MAMMARY CARCINOMA ER/PR positive LEFT BREAST UPPER inner  QUAD  . GERD (gastroesophageal reflux disease)    OCC  . Hypothyroidism   . Sjogren's syndrome [facility])     Past Surgical History:  Procedure Laterality Date  . BREAST BIOPSY Left 05/12/2017   [place] bx/ INVASIVE MAMMARY CARCINOMA  . BREAST LUMPECTOMY WITH SENTINEL LYMPH NODE BX AND NEEDLE LOCALIZATION Left 06/10/2017   Performed by Robert Bellow, MD at [facility] ORS  . CESAREAN SECTION  1995  . COSMETIC SURGERY    . CYST EXCISION  2018   pilar cyst/ Dr Will Bonnet  . MAMMOSITE BREAST Left 06/24/2017   Performed by Robert Bellow, MD at [facility] ORS  . TONSILLECTOMY      Family History: Significant diagnoses include the following:  Family History  Problem Relation Age of Onset  . Breast cancer Maternal Aunt 47       currently ~65  . Diabetes Mother   . Skin cancer Mother        currently 80; TAH/BSO (age?)  . Anemia Mother   . Liver disease  Father        deceased / not much info about him / alcoholic  . Lung cancer Paternal Aunt        smoker / deceased 63s  . Stomach cancer Paternal Uncle 79       deceased / deceased 64s  . Ovarian cancer Maternal Grandmother 9       deceased 46s  . Stomach cancer Paternal Grandfather 24       deceased 43s  . Cancer Maternal Uncle        unk. type; deceased 76s  . Leukemia Cousin 72       deceased 48    Additionally, [name] has 3 sons.  She has no full siblings. She has a maternal half-sister and half-brother and a paternal  half-sister and half-brother. Her mother (age 33) had a TAH/BSO, but she did not know at what age. Her mother had a total of 4 brothers and 2 sisters. Her father had a total of 2 brothers and a sister.  [name] ancestry is [place]. There is no known Jewish ancestry and no consanguinity.  Discussion: We reviewed the characteristics, features and inheritance patterns of hereditary cancer syndromes. We discussed her risk of harboring a mutation in the context of her personal and family history. We discussed the process of genetic testing, insurance coverage and implications of results: positive, negative and variant of unknown significance (VUS).   [name] questions were answered to her satisfaction today and she is welcome to call with any additional questions or concerns. Thank you for the referral and allowing [place] to share in the care of your patient.    Steele Berg, MS, [place] Certified Genetic Counselor phone: [phone]

## 2017-07-01 ENCOUNTER — Ambulatory Visit
Admission: RE | Admit: 2017-07-01 | Discharge: 2017-07-01 | Disposition: A | Discharge status: Home / Self Care | Payer: Medicaid Other | Source: Ambulatory Visit | Attending: Radiation Oncology | Admitting: Radiation Oncology

## 2017-07-01 DIAGNOSIS — Z510 Encounter for antineoplastic radiation therapy: Secondary | ICD-10-CM | POA: Diagnosis not present

## 2017-07-02 ENCOUNTER — Ambulatory Visit
Admission: RE | Admit: 2017-07-02 | Discharge: 2017-07-02 | Disposition: A | Discharge status: Home / Self Care | Payer: Medicaid Other | Source: Ambulatory Visit | Attending: Radiation Oncology | Admitting: Radiation Oncology

## 2017-07-02 DIAGNOSIS — Z510 Encounter for antineoplastic radiation therapy: Secondary | ICD-10-CM | POA: Diagnosis not present

## 2017-07-07 MED ORDER — CEFAZOLIN SODIUM-DEXTROSE 2-4 GM/100ML-% IV SOLN: 2.0000 g | INTRAVENOUS | Status: AC

## 2017-07-08 ENCOUNTER — Ambulatory Visit: Payer: Medicaid Other

## 2017-07-08 ENCOUNTER — Encounter
Admission: RE | Disposition: A | Discharge status: Home / Self Care | Payer: Self-pay | Source: Ambulatory Visit | Attending: General Surgery

## 2017-07-08 ENCOUNTER — Telehealth: Payer: Self-pay | Admitting: Genetic Counselor

## 2017-07-08 ENCOUNTER — Ambulatory Visit: Payer: Medicaid Other | Admitting: Certified Registered Nurse Anesthetist

## 2017-07-08 ENCOUNTER — Other Ambulatory Visit: Payer: Self-pay | Admitting: Oncology

## 2017-07-08 ENCOUNTER — Ambulatory Visit
Admission: RE | Admit: 2017-07-08 | Discharge: 2017-07-08 | Disposition: A | Discharge status: Home / Self Care | Payer: Medicaid Other | Source: Ambulatory Visit | Attending: General Surgery | Admitting: General Surgery

## 2017-07-08 DIAGNOSIS — I10 Essential (primary) hypertension: Secondary | ICD-10-CM | POA: Diagnosis not present

## 2017-07-08 DIAGNOSIS — Z79899 Other long term (current) drug therapy: Secondary | ICD-10-CM | POA: Diagnosis not present

## 2017-07-08 DIAGNOSIS — C50912 Malignant neoplasm of unspecified site of left female breast: Secondary | ICD-10-CM | POA: Diagnosis present

## 2017-07-08 DIAGNOSIS — E039 Hypothyroidism, unspecified: Secondary | ICD-10-CM | POA: Diagnosis not present

## 2017-07-08 DIAGNOSIS — Z882 Allergy status to sulfonamides status: Secondary | ICD-10-CM | POA: Insufficient documentation

## 2017-07-08 DIAGNOSIS — F419 Anxiety disorder, unspecified: Secondary | ICD-10-CM | POA: Insufficient documentation

## 2017-07-08 DIAGNOSIS — Z95828 Presence of other vascular implants and grafts: Secondary | ICD-10-CM

## 2017-07-08 DIAGNOSIS — C50412 Malignant neoplasm of upper-outer quadrant of left female breast: Secondary | ICD-10-CM

## 2017-07-08 DIAGNOSIS — Z87891 Personal history of nicotine dependence: Secondary | ICD-10-CM | POA: Diagnosis not present

## 2017-07-08 DIAGNOSIS — Z170 Estrogen receptor positive status [ER+]: Secondary | ICD-10-CM | POA: Diagnosis not present

## 2017-07-08 DIAGNOSIS — K219 Gastro-esophageal reflux disease without esophagitis: Secondary | ICD-10-CM | POA: Diagnosis not present

## 2017-07-08 DIAGNOSIS — C50212 Malignant neoplasm of upper-inner quadrant of left female breast: Secondary | ICD-10-CM | POA: Diagnosis not present

## 2017-07-08 DIAGNOSIS — Z6841 Body Mass Index (BMI) 40.0 and over, adult: Secondary | ICD-10-CM | POA: Diagnosis not present

## 2017-07-08 DIAGNOSIS — Z4682 Encounter for fitting and adjustment of non-vascular catheter: Secondary | ICD-10-CM | POA: Diagnosis not present

## 2017-07-08 DIAGNOSIS — M3500 Sicca syndrome, unspecified: Secondary | ICD-10-CM | POA: Insufficient documentation

## 2017-07-08 HISTORY — PX: PORTACATH PLACEMENT: SHX2246

## 2017-07-08 SURGERY — INSERTION, TUNNELED CENTRAL VENOUS DEVICE, WITH PORT
Anesthesia: General | Laterality: Right

## 2017-07-08 MED ORDER — FAMOTIDINE 20 MG PO TABS
20.0000 mg | ORAL_TABLET | Freq: Once | ORAL | Status: AC
  Administered 2017-07-08: 20 mg via ORAL

## 2017-07-08 MED ORDER — FENTANYL CITRATE (PF) 100 MCG/2ML IJ SOLN
INTRAMUSCULAR | Status: DC | PRN
  Administered 2017-07-08 (×2): 50 ug via INTRAVENOUS

## 2017-07-08 MED ORDER — LIDOCAINE HCL (PF) 2 % IJ SOLN
INTRAMUSCULAR | Status: AC
  Filled 2017-07-08: qty 10

## 2017-07-08 MED ORDER — FENTANYL CITRATE (PF) 100 MCG/2ML IJ SOLN
INTRAMUSCULAR | Status: AC
  Filled 2017-07-08: qty 2

## 2017-07-08 MED ORDER — SODIUM CHLORIDE 0.9 % IJ SOLN
INTRAMUSCULAR | Status: AC
  Filled 2017-07-08: qty 50

## 2017-07-08 MED ORDER — LACTATED RINGERS IV SOLN
INTRAVENOUS | Status: DC
  Administered 2017-07-08: 07:00:00 via INTRAVENOUS

## 2017-07-08 MED ORDER — MIDAZOLAM HCL 2 MG/2ML IJ SOLN
INTRAMUSCULAR | Status: DC | PRN
  Administered 2017-07-08: 2 mg via INTRAVENOUS

## 2017-07-08 MED ORDER — MIDAZOLAM HCL 2 MG/2ML IJ SOLN
INTRAMUSCULAR | Status: AC
  Filled 2017-07-08: qty 2

## 2017-07-08 MED ORDER — PROPOFOL 500 MG/50ML IV EMUL
INTRAVENOUS | Status: DC | PRN
  Administered 2017-07-08: 50 ug/kg/min via INTRAVENOUS

## 2017-07-08 MED ORDER — FAMOTIDINE 20 MG PO TABS
ORAL_TABLET | ORAL | Status: AC
  Administered 2017-07-08: 20 mg via ORAL
  Filled 2017-07-08: qty 1

## 2017-07-08 MED ORDER — SODIUM CHLORIDE 0.9 % IJ SOLN
INTRAMUSCULAR | Status: DC | PRN
  Administered 2017-07-08: 30 mL

## 2017-07-08 MED ORDER — ONDANSETRON HCL 4 MG/2ML IJ SOLN: 4.0000 mg | Freq: Once | INTRAMUSCULAR | Status: DC | PRN

## 2017-07-08 MED ORDER — SODIUM CHLORIDE 0.9 % IV SOLN
INTRAVENOUS | Status: DC | PRN
  Administered 2017-07-08: 5 mL

## 2017-07-08 MED ORDER — CEFAZOLIN SODIUM-DEXTROSE 2-3 GM-%(50ML) IV SOLR
INTRAVENOUS | Status: DC | PRN
  Administered 2017-07-08: 2 g via INTRAVENOUS

## 2017-07-08 MED ORDER — LIDOCAINE HCL (PF) 1 % IJ SOLN
INTRAMUSCULAR | Status: AC
  Filled 2017-07-08: qty 30

## 2017-07-08 MED ORDER — SODIUM CHLORIDE FLUSH 0.9 % IV SOLN
INTRAVENOUS | Status: AC
  Filled 2017-07-08: qty 20

## 2017-07-08 MED ORDER — PROPOFOL 10 MG/ML IV BOLUS
INTRAVENOUS | Status: DC | PRN
  Administered 2017-07-08: 20 mg via INTRAVENOUS
  Administered 2017-07-08: 10 mg via INTRAVENOUS

## 2017-07-08 MED ORDER — FENTANYL CITRATE (PF) 100 MCG/2ML IJ SOLN: 25.0000 ug | INTRAMUSCULAR | Status: DC | PRN

## 2017-07-08 MED ORDER — LIDOCAINE HCL 1 % IJ SOLN
INTRAMUSCULAR | Status: DC | PRN
  Administered 2017-07-08: 20 mL

## 2017-07-08 MED ORDER — LIDOCAINE HCL (CARDIAC) 20 MG/ML IV SOLN
INTRAVENOUS | Status: DC | PRN
  Administered 2017-07-08: 100 mg via INTRAVENOUS

## 2017-07-08 MED ORDER — ONDANSETRON HCL 4 MG/2ML IJ SOLN
INTRAMUSCULAR | Status: AC
  Filled 2017-07-08: qty 2

## 2017-07-08 MED ORDER — PROPOFOL 10 MG/ML IV BOLUS
INTRAVENOUS | Status: AC
  Filled 2017-07-08: qty 20

## 2017-07-08 SURGICAL SUPPLY — 31 items
BENZOIN TINCTURE PRP APPL 2/3 (GAUZE/BANDAGES/DRESSINGS) ×2 IMPLANT
BLADE SURG 15 STRL SS SAFETY (BLADE) ×2 IMPLANT
CHLORAPREP W/TINT 26ML (MISCELLANEOUS) ×2 IMPLANT
CONRAY 60ML FOR OR (MISCELLANEOUS) ×2 IMPLANT
COVER LIGHT HANDLE STERIS (MISCELLANEOUS) ×4 IMPLANT
DECANTER SPIKE VIAL GLASS SM (MISCELLANEOUS) ×4 IMPLANT
DRAPE C-ARM XRAY 36X54 (DRAPES) ×2 IMPLANT
DRAPE LAPAROTOMY TRNSV 106X77 (MISCELLANEOUS) ×2 IMPLANT
DRSG TEGADERM 2-3/8X2-3/4 SM (GAUZE/BANDAGES/DRESSINGS) ×2 IMPLANT
DRSG TEGADERM 4X4.75 (GAUZE/BANDAGES/DRESSINGS) ×2 IMPLANT
DRSG TELFA 3X8 NADH (GAUZE/BANDAGES/DRESSINGS) ×2 IMPLANT
DRSG TELFA 4X3 1S NADH ST (GAUZE/BANDAGES/DRESSINGS) ×2 IMPLANT
ELECT REM PT RETURN 9FT ADLT (ELECTROSURGICAL) ×2
ELECTRODE REM PT RTRN 9FT ADLT (ELECTROSURGICAL) ×1 IMPLANT
GLOVE BIO SURGEON STRL SZ7.5 (GLOVE) ×6 IMPLANT
GLOVE INDICATOR 8.0 STRL GRN (GLOVE) ×2 IMPLANT
GOWN STRL REUS W/ TWL LRG LVL3 (GOWN DISPOSABLE) ×2 IMPLANT
GOWN STRL REUS W/TWL LRG LVL3 (GOWN DISPOSABLE) ×2
KIT PORT POWER 8FR ISP CVUE (Miscellaneous) ×2 IMPLANT
KIT RM TURNOVER STRD PROC AR (KITS) ×2 IMPLANT
LABEL OR SOLS (LABEL) ×2 IMPLANT
NS IRRIG 500ML POUR BTL (IV SOLUTION) ×2 IMPLANT
PACK PORT-A-CATH (MISCELLANEOUS) ×2 IMPLANT
STRIP CLOSURE SKIN 1/2X4 (GAUZE/BANDAGES/DRESSINGS) ×2 IMPLANT
SUT PROLENE 3 0 SH DA (SUTURE) ×2 IMPLANT
SUT VIC AB 3-0 SH 27 (SUTURE) ×1
SUT VIC AB 3-0 SH 27X BRD (SUTURE) ×1 IMPLANT
SUT VIC AB 4-0 FS2 27 (SUTURE) ×2 IMPLANT
SWABSTK COMLB BENZOIN TINCTURE (MISCELLANEOUS) ×2 IMPLANT
SYR 10ML LL (SYRINGE) ×2 IMPLANT
SYR 10ML SLIP (SYRINGE) ×2 IMPLANT

## 2017-07-08 NOTE — Progress Notes (Signed)
Hematology/Oncology Follow up note [facility] Telephone:([phone] Fax:([phone]) [phone]   Patient Care Team: Herminio Commons, MD as PCP - General (Family Medicine) Rico Junker, RN as Registered Nurse Theodore Demark, RN as Registered Nurse  REFERRING PROVIDER: Herminio Commons, MD REASON FOR VISIT Follow up for treatment of  Breast cancer  HISTORY OF PRESENTING ILLNESS:  Sheena Martinez is a @ 56 y.o.  female with PMH listed below who was referred to me for evaluation of newly diagnosed breast cancer. Patient reports that she is been doing fine and she underwent screening annual mammogram which discovered abdominal findings.09/19/2018patient underwent screening, bilateral mammogram warrant further evaluation.on 05/12/2017, patient underwent diagnostic mammogram which discoveredleft breast irregular mass was spiculated margins in the upper slightly inner left breast. Ultrasound of the superior left breast showed hypoechoic mass with spiculated margins at 11:00, 4 cm from the nipple measuring 574 mm. Patient underwent biopsy of the left breast mass, pathology showed invasive mammary carcinoma. ER/PR >+90%, HER-2 IHC +2 equivocal, fish negative. Patient has been evaluated by Dr. Bary Castilla this week. Patient presented to the clinic today, accomplished by her daughter-in-law to establish care. Patient overall reports feeling well. She has a positive family history of breast cancer in her maternal aunt. 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 She does not have insurance pending applying Medicaid. She has a history of Sjogren disease for which she has had tear duct surgery. She also reports history of PCOS with elevated testosterone level. She is perimenopausal, her last menstrual period reported as 3 months ago, last for about 3 months. Denies any hormonal replacement therapy.   Pathology revealed a 6 mm ER PR positive,  HER-2 negative left invasive mammary carcinoma, margin is negative, no LV I. Pathological stage and pT1a N0. Mammoprintr was sent during intervaland came back with 10 year recurrence rate at 29% high risk group. Patient has completed MammoSite RT   INTERVAL HISTORY Patient had lumpectomy on 06/10/2017, she presented today to the clinic for evaluation prior to adjuvant chemotherapy. Feeling well, denies any complaints.  Denies any shortness of breath, bone pain, lower extremity swelling.  Review of Systems  Constitutional: Negative for chills and fever.  HENT: Negative for hearing loss.   Eyes: Negative for blurred vision and double vision.  Respiratory: Negative for cough and hemoptysis.   Cardiovascular: Negative for chest pain and palpitations.  Gastrointestinal: Negative for heartburn and nausea.  Genitourinary: Negative for dysuria and urgency.  Musculoskeletal: Negative for myalgias and neck pain.  Skin: Negative for rash.  Neurological: Negative for dizziness and headaches.  Endo/Heme/Allergies: Does not bruise/bleed easily.  Psychiatric/Behavioral: Negative for depression.   MEDICAL HISTORY:  Past Medical History:  Diagnosis Date  . Anxiety   . Arthritis   . Breast cancer ([place]) 05/12/2017   INVASIVE MAMMARY CARCINOMA ER/PR positive LEFT BREAST UPPER inner  QUAD  . GERD (gastroesophageal reflux disease)    OCC  . Hypothyroidism   . Sjogren's syndrome ([place])     SURGICAL HISTORY: Past Surgical History:  Procedure Laterality Date  . BREAST BIOPSY Left 05/12/2017   [place] bx/ INVASIVE MAMMARY CARCINOMA  . BREAST LUMPECTOMY WITH SENTINEL LYMPH NODE BIOPSY Left 06/10/2017   Procedure: BREAST LUMPECTOMY WITH SENTINEL LYMPH NODE BX AND NEEDLE LOCALIZATION;  Surgeon: Robert Bellow, MD;  Location: [facility] ORS;  Service: General;  Laterality: Left;  . BREAST MAMMOSITE Left 06/24/2017   Procedure: MAMMOSITE  BREAST;  Surgeon: Robert Bellow, MD;  Location: [facility] ORS;  Service:  General;  Laterality: Left;  . CESAREAN SECTION  1995  . COSMETIC SURGERY    . CYST EXCISION  2018   pilar cyst/ Dr Will Bonnet  . TONSILLECTOMY      SOCIAL HISTORY: Social History   Socioeconomic History  . Marital status: Married    Spouse name: Not on file  . Number of children: Not on file  . Years of education: Not on file  . Highest education level: Not on file  Social Needs  . Financial resource strain: Not on file  . Food insecurity - worry: Never true  . Food insecurity - inability: Never true  . Transportation needs - medical: No  . Transportation needs - non-medical: No  Occupational History  . Not on file  Tobacco Use  . Smoking status: Former Smoker    Years: 7.00    Types: Cigarettes    Last attempt to quit: 08/13/1979    Years since quitting: 37.9  . Smokeless tobacco: Never Used  . Tobacco comment: AGE [age]  Substance and Sexual Activity  . Alcohol use: No  . Drug use: No  . Sexual activity: Yes  Other Topics Concern  . Not on file  Social History Narrative  . Not on file    FAMILY HISTORY: Family History  Problem Relation Age of Onset  . Breast cancer Maternal Aunt 86       currently ~65  . Diabetes Mother   . Skin cancer Mother        currently 14; TAH/BSO (age?)  . Anemia Mother   . Liver disease Father        deceased / not much info about him / alcoholic  . Lung cancer Paternal Aunt        smoker / deceased 53s  . Stomach cancer Paternal Uncle 22       deceased / deceased 29s  . Ovarian cancer Maternal Grandmother 15       deceased 106s  . Stomach cancer Paternal Grandfather 70       deceased 20s  . Cancer Maternal Uncle        unk. type; deceased 25s  . Leukemia Cousin 20       deceased 26    ALLERGIES:  is allergic to sulfamethoxazole-trimethoprim.  MEDICATIONS:  Current Outpatient Medications  Medication Sig Dispense Refill  . acetaminophen (TYLENOL) 500 MG tablet Take 1,000 mg by mouth every 8 (eight) hours as needed for mild  pain or headache.    . Boric Acid POWD Place 1 capsule daily as needed vaginally (for pH levels).     . Ca Carbonate-Mag Hydroxide (ROLAIDS PO) Take 1 tablet as needed by mouth (indigestion).     . Carboxymethylcell-Hypromellose (GENTEAL OP) Apply 1 drop to eye 2 (two) times daily.    . clotrimazole-betamethasone (LOTRISONE) cream Apply 1 application 2 (two) times a week topically.    Marland Kitchen HYDROcodone-acetaminophen (NORCO) 5-325 MG tablet Take 1-2 tablets by mouth every 4 (four) hours as needed for moderate pain. Take no more than 10 tabs/ day. (Patient taking differently: Take 1 tablet every 4 (four) hours as needed by mouth for moderate pain. Take no more than 10 tabs/ day.) 30 tablet 0  . ketoconazole (NIZORAL) 2 % shampoo Apply 1 application topically 2 (two) times a week.    . meloxicam (MOBIC) 15 MG tablet Take 15 mg by mouth at bedtime.     Marland Kitchen  PARoxetine (PAXIL) 20 MG tablet Take 20 mg by mouth at bedtime.     . prednisoLONE sodium phosphate (INFLAMASE FORTE) 1 % ophthalmic solution Place 1 drop 2 (two) times daily as needed into the left eye (eye infection).      No current facility-administered medications for this visit.       Marland Kitchen  PHYSICAL EXAMINATION: ECOG PERFORMANCE STATUS: 0 - Asymptomatic Vitals:   07/09/17 0928  BP: 134/78  Pulse: 68  Resp: 18  Temp: 98 F (36.7 C)   Filed Weights   07/09/17 0928  Weight: 230 lb (104.3 kg)   ECOG 0 GENERAL: No distress, well nourished.  SKIN:  No rashes or significant lesions  HEAD: Normocephalic, No masses, lesions, tenderness or abnormalities  EYES: Conjunctiva are pink, non icteric ENT: External ears normal ,lips , buccal mucosa, and tongue normal and mucous membranes are moist  LYMPH: No palpable cervical and axillary lymphadenopathy  LUNGS: Clear to auscultation, no crackles or wheezes HEART: Regular rate & rhythm, no murmurs, no gallops, S1 normal and S2 normal  ABDOMEN: Abdomen soft, non-tender, normal bowel sounds, I did not  appreciate any  masses or organomegaly  MUSCULOSKELETAL: No CVA tenderness and no tenderness on percussion of the back or rib cage.  EXTREMITIES: No edema, no skin discoloration or tenderness NEURO: Alert & oriented, no focal motor/sensory deficits. Breast exam incision well-healed. Can not palpate any lymphadenopathy or mass.  LABORATORY DATA:  I have reviewed the data as listed: no recent labs.  Lab Results  Component Value Date   WBC 5.1 06/30/2017   HGB 13.0 06/30/2017   HCT 38.4 06/30/2017   MCV 91.7 06/30/2017   PLT 363 06/30/2017   Recent Labs    06/25/17 1235 06/30/17 1433  NA 135 136  K 4.5 4.3  CL 103 103  CO2 27 27  GLUCOSE 81 93  BUN 17 21*  CREATININE 0.62 0.70  CALCIUM 9.4 9.2  GFRNONAA >60 >60  GFRAA >60 >60  PROT 7.0 7.0  ALBUMIN 3.6 3.5  AST 19 18  ALT 21 17  ALKPHOS 103 97  BILITOT 0.4 0.4    RADIOGRAPHIC STUDIES: I have personally reviewed the radiological images as listed and agreed with the findings in the report. Mammogram digital diagnostic 05/12/2017 FINDINGS: There is an irregular mass with spiculated margins in the upper, slightly inner left breast, corresponding to the abnormality seen on screening mammogram.  Mammographic images were processed with CAD.  Targeted ultrasound of the superior left breast was performed demonstrating an irregular, hypoechoic mass with spiculated margins at 11 o'clock, 4 cm from the nipple measuring approximately 5 x 7 x 4 mm, corresponding to the mass seen mammographically. Targeted ultrasound of the left axilla demonstrates no suspicious appearing axillary lymph nodes.  IMPRESSION: Indeterminate left breast mass.  RECOMMENDATION: Ultrasound-guided left breast biopsy.   Genetic testing result vis [facility] showed no pathogenic sequence appearance or deletions /duplications.   ASSESSMENT & PLAN:  Cancer Staging Malignant neoplasm of upper-inner quadrant of left breast in female, estrogen  receptor positive ([place]) Staging form: Breast, AJCC 8th Edition - Clinical stage from 05/22/2017: Stage IA (cT1, cN0, cM0, G1, ER: Positive, PR: Positive, HER2: Negative) - Signed by Earlie Server, MD on 05/22/2017  1. Malignant neoplasm of upper-inner quadrant of left breast in female, estrogen receptor positive ([place])   2. Encounter for antineoplastic chemotherapy     #Okay to proceed cycle 1 Taxotere and Cytoxan + onpro.   # Antiemetics-Zofran and Compazine;  EMLA cream sent to pharmacy # peri-menopausal status: will offer tamoxifen after she finishes Adjuvant chemotherapy # Advise patient to obtain dental clearance. Plan offering adjuvant Zometa for 6 cycles.  Return of visit: 2 weeks Lab/MD/ TC/ possible Zometa.   Earlie Server, MD, PhD Hematology Oncology [facility] Pager- [phone] 07/09/2017

## 2017-07-08 NOTE — Telephone Encounter (Signed)
Cancer Genetics             Telegenetics Results Disclosure   Patient Name: Sheena Martinez Patient DOB: [birth year] Patient Age: 56 y.o. Phone Call Date: 07/08/2017  Referring Provider: Telford Nab, MD    Sheena Martinez was called today to discuss genetic test results. Please see the Genetics telephone note from 06/30/2017 for a detailed discussion of her personal and family histories and the recommendations provided.  Genetic Testing: At the time of Sheena Martinez visit, she decided to pursue genetic testing of multiple genes associated with hereditary susceptibility to cancer. Testing included sequencing and deletion/duplication analysis. Testing did not reveal any pathogenic mutation in any of these genes.  A copy of the genetic test report will be scanned into Epic under the media tab.  The genes analyzed were the 47 genes on [facility]'s Common Cancers panel (APC, ATM, AXIN2, BARD1, BMPR1A, BRCA1, BRCA2, BRIP1, CDH1, CDK4, CDKN2A, CHEK2, CTNNA1, DICER1, EPCAM, GREM1, HOXB13, KIT, MEN1, MLH1, MSH2, MSH3, MSH6, MUTYH, NBN, NF1, NTHL1, PALB2, PDGFRA, PMS2, POLD1, POLE, PTEN, RAD50, RAD51C, RAD51D, SDHA, SDHB, SDHC, SDHD, SMAD4, SMARCA4, STK11, TP53, TSC1, TSC2, VHL).  Since the current test is not perfect, it is possible that there may be a gene mutation that current testing cannot detect, but that chance is small. It is possible that a different genetic factor, which has not yet been discovered or is not on this panel, is responsible for the cancer diagnoses in the family. Again, the likelihood of this is low. No additional testing is recommended at this time for Sheena Martinez.  Cancer Screening: These results suggest that Sheena Martinez cancer was most likely not due to an inherited predisposition. Most cancers happen by chance and this test, along with details of her family history, suggests that her cancer falls into this category. We discussed continuing to follow  the cancer screening guidelines provided by her physician.   Family Members: Family members are at some increased risk of developing cancer, over the general population risk, simply due to the family history. Women are recommended to have a yearly mammogram beginning at age 61, a yearly clinical breast exam, a yearly gynecologic exam and perform monthly breast self-exams. Colon cancer screening is recommended to begin by age 51 in both men and women, unless there is a family history of colon cancer or colon polyps or an individual has a personal history to warrant initiating screening at a younger age.  Any relative who had cancer at a young age or had a particularly rare cancer may also wish to pursue genetic testing. Genetic counselors can be located in other cities, by visiting the website of the [employer] ([employer]) and Field seismologist for a Dietitian by zip code.   Lastly, cancer genetics is a rapidly advancing field and it is possible that new genetic tests will be appropriate for Sheena Martinez in the future. We encourage Sheena Martinez to remain in contact with Genetics on an annual basis so her personal and family histories can be updated.    Steele Berg, MS, [place] Certified Genetic Counselor phone: [phone]

## 2017-07-08 NOTE — OR Nursing (Signed)
Port envelope w/booklet, etc. Given to pt postop.

## 2017-07-08 NOTE — OR Nursing (Signed)
Dr. Bary Castilla in to see pt [phone]

## 2017-07-08 NOTE — Op Note (Signed)
Preoperative diagnosis: Left breast cancer, need for adjuvant chemotherapy.  Postoperative diagnosis: Same.  Operative procedure: Right subclavian PowerPort placement with ultrasound and fluoroscopic guidance.  Operating Surgeon: Hervey Ard, MD.  Anesthesia: Attended local, 20 cc 1% plain Xylocaine.  Estimated blood loss: Less than 5 cc.  Clinical note: This 56 year old woman who underwent resection of a left breast cancer.  She has a high Mammoprint score and is felt to be a candidate for adjuvant chemotherapy.  Central venous access was requested by her treating oncologist.  The patient receives ceftezole and prior to surgery.  Operative note: With the patient comfortably supine on the operating table in the right arm tucked she was placed in Trendelenburg position and the right chest and neck cleansed with ChloraPrep and draped.  Ultrasound was used to confirm patency of the right subclavian vein.  Under ultrasound guidance the vein was cannulated followed by passage of a guidewire and the infusion catheter.  The patient was placed supine and the infusion catheter placed at the anticipated junction of the SVC and right atrium.  This was slightly more medially orientated than anticipated and for that reason 5 cc of Conray 60 was injected to confirm the flow was into the ventricle rather than into the arterial tree.  This did confirm venous placement.  The port was tunneled to a pocket on the right anterior chest.  The port was anchored to the deep tissue with interrupted 3-0 Prolene sutures.  The wound was closed with 3-0 Vicryl to the adipose layer and a running 4-0 Vicryl subarticular suture for the skin.  Benzoin Steri-Strips applied.  The port was cannulated and easily aspirated and irrigated.  It was left cannulated as her first treatment is scheduled for tomorrow.  Both in endcap and a clamp were in place.  Telfa and Tegaderm dressing applied.  Erect portable chest x-ray completed in the  PACU confirmed position of the catheter as described above and no evidence of pneumothorax.

## 2017-07-08 NOTE — Discharge Instructions (Signed)

## 2017-07-08 NOTE — Anesthesia Procedure Notes (Signed)
Procedure Name: General with mask airway Date/Time: 07/08/2017 7:35 AM Performed by: Johnna Acosta, CRNA Pre-anesthesia Checklist: Patient identified, Emergency Drugs available, Suction available, Patient being monitored and Timeout performed Patient Re-evaluated:Patient Re-evaluated prior to induction Oxygen Delivery Method: Simple face mask Preoxygenation: Pre-oxygenation with 100% oxygen

## 2017-07-08 NOTE — Anesthesia Preprocedure Evaluation (Signed)
Anesthesia Evaluation  Patient identified by MRN, date of birth, ID band Patient awake    Reviewed: Allergy & Precautions, H&P , NPO status , Patient's Chart, lab work & pertinent test results, reviewed documented beta blocker date and time   Airway Mallampati: II  TM Distance: >3 FB Neck ROM: full    Dental no notable dental hx. (+) Teeth Intact   Pulmonary neg pulmonary ROS, former smoker,    Pulmonary exam normal breath sounds clear to auscultation       Cardiovascular Exercise Tolerance: Good hypertension, On Medications negative cardio ROS   Rhythm:regular Rate:Normal     Neuro/Psych negative neurological ROS  negative psych ROS   GI/Hepatic negative GI ROS, Neg liver ROS, GERD  Medicated,  Endo/Other  negative endocrine ROSdiabetesHypothyroidism Morbid obesity  Renal/GU      Musculoskeletal   Abdominal   Peds  Hematology negative hematology ROS (+)   Anesthesia Other Findings   Reproductive/Obstetrics negative OB ROS                             Anesthesia Physical Anesthesia Plan  ASA: III  Anesthesia Plan: MAC   Post-op Pain Management:    Induction:   PONV Risk Score and Plan: 3  Airway Management Planned:   Additional Equipment:   Intra-op Plan:   Post-operative Plan:   Informed Consent: I have reviewed the patients History and Physical, chart, labs and discussed the procedure including the risks, benefits and alternatives for the proposed anesthesia with the patient or authorized representative who has indicated his/her understanding and acceptance.     Plan Discussed with: CRNA  Anesthesia Plan Comments:         Anesthesia Quick Evaluation

## 2017-07-08 NOTE — H&P (Signed)
Sheena Martinez [id number] [birth year]     HPI: 56 y/o female for adjuvant chemotherapy for left breast cancer. Central access requested.   Medications Prior to Admission  Medication Sig Dispense Refill Last Dose  . acetaminophen (TYLENOL) 500 MG tablet Take 1,000 mg by mouth every 8 (eight) hours as needed for mild pain or headache.   Past Month at Unknown time  . Boric Acid POWD Place 1 capsule daily as needed vaginally (for pH levels).    Past Month at Unknown time  . Ca Carbonate-Mag Hydroxide (ROLAIDS PO) Take 1 tablet as needed by mouth (indigestion).    Past Month at Unknown time  . Carboxymethylcell-Hypromellose (GENTEAL OP) Apply 1 drop to eye 2 (two) times daily.   07/08/2017 at 0530  . cefadroxil (DURICEF) 500 MG capsule Take 1 capsule (500 mg total) 2 (two) times daily by mouth. Start one hour before [facility] procedure on 06-24-17 24 capsule 0 07/05/2017  . clotrimazole-betamethasone (LOTRISONE) cream Apply 1 application 2 (two) times a week topically.   07/05/2017  . HYDROcodone-acetaminophen (NORCO) 5-325 MG tablet Take 1-2 tablets by mouth every 4 (four) hours as needed for moderate pain. Take no more than 10 tabs/ day. (Patient taking differently: Take 1 tablet every 4 (four) hours as needed by mouth for moderate pain. Take no more than 10 tabs/ day.) 30 tablet 0 Past Month at Unknown time  . ketoconazole (NIZORAL) 2 % shampoo Apply 1 application topically 2 (two) times a week.   07/05/2017  . meloxicam (MOBIC) 15 MG tablet Take 15 mg by mouth at bedtime.    07/07/2017 at Unknown time  . PARoxetine (PAXIL) 20 MG tablet Take 20 mg by mouth at bedtime.    07/07/2017 at Unknown time  . prednisoLONE sodium phosphate (INFLAMASE FORTE) 1 % ophthalmic solution Place 1 drop 2 (two) times daily as needed into the left eye (eye infection).    Past Month at Unknown time   Allergies  Allergen Reactions  . Sulfamethoxazole-Trimethoprim Rash    Chest tightness. Bactrim   Past Medical History:   Diagnosis Date  . Anxiety   . Arthritis   . Breast cancer ([place]) 05/12/2017   INVASIVE MAMMARY CARCINOMA ER/PR positive LEFT BREAST UPPER inner  QUAD  . GERD (gastroesophageal reflux disease)    OCC  . Hypothyroidism   . Sjogren's syndrome [facility])    Past Surgical History:  Procedure Laterality Date  . BREAST BIOPSY Left 05/12/2017   [place] bx/ INVASIVE MAMMARY CARCINOMA  . BREAST LUMPECTOMY WITH SENTINEL LYMPH NODE BIOPSY Left 06/10/2017   Procedure: BREAST LUMPECTOMY WITH SENTINEL LYMPH NODE BX AND NEEDLE LOCALIZATION;  Surgeon: Robert Bellow, MD;  Location: [facility] ORS;  Service: General;  Laterality: Left;  . BREAST MAMMOSITE Left 06/24/2017   Procedure: MAMMOSITE BREAST;  Surgeon: Robert Bellow, MD;  Location: [facility] ORS;  Service: General;  Laterality: Left;  . CESAREAN SECTION  1995  . COSMETIC SURGERY    . CYST EXCISION  2018   pilar cyst/ Dr Will Bonnet  . TONSILLECTOMY     Social History   Socioeconomic History  . Marital status: Married    Spouse name: Not on file  . Number of children: Not on file  . Years of education: Not on file  . Highest education level: Not on file  Social Needs  . Financial resource strain: Not on file  . Food insecurity - worry: Never true  . Food insecurity - inability: Never true  . Transportation needs -  medical: No  . Transportation needs - non-medical: No  Occupational History  . Not on file  Tobacco Use  . Smoking status: Former Smoker    Years: 7.00    Types: Cigarettes    Last attempt to quit: 08/13/1979    Years since quitting: 37.9  . Smokeless tobacco: Never Used  . Tobacco comment: AGE [age]  Substance and Sexual Activity  . Alcohol use: No  . Drug use: No  . Sexual activity: Yes  Other Topics Concern  . Not on file  Social History Narrative  . Not on file   Social History   Social History Narrative  . Not on file     ROS: Negative.     PE: HEENT: Negative. Lungs: Clear. Cardio: RR. Left breast: soft,  supple. Non-tender.   Assessment/Plan:  Proceed with planned port placement.  Robert Bellow 07/08/2017

## 2017-07-08 NOTE — Transfer of Care (Signed)
Immediate Anesthesia Transfer of Care Note  Patient: Sheena Martinez  Procedure(s) Performed: INSERTION PORT-A-CATH (Right )  Patient Location: PACU  Anesthesia Type:General  Level of Consciousness: awake, alert  and oriented  Airway & Oxygen Therapy: Patient Spontanous Breathing  Post-op Assessment: Report given to RN and Post -op Vital signs reviewed and stable  Post vital signs: Reviewed and stable  Last Vitals:  Vitals:   07/08/17 0629  BP: 122/63  Pulse: 75  Resp: 18  Temp: 36.7 C  SpO2: 98%    Last Pain:  Vitals:   07/08/17 0629  TempSrc: Oral  PainSc: 0-No pain         Complications: No apparent anesthesia complications

## 2017-07-08 NOTE — Anesthesia Post-op Follow-up Note (Signed)
Anesthesia QCDR form completed.        

## 2017-07-09 ENCOUNTER — Inpatient Hospital Stay: Payer: Medicaid Other

## 2017-07-09 ENCOUNTER — Encounter: Payer: Self-pay | Admitting: General Surgery

## 2017-07-09 ENCOUNTER — Inpatient Hospital Stay (HOSPITAL_BASED_OUTPATIENT_CLINIC_OR_DEPARTMENT_OTHER): Payer: Medicaid Other | Admitting: Oncology

## 2017-07-09 VITALS — BP 110/75 | HR 61 | Temp 97.6°F | Resp 18

## 2017-07-09 VITALS — BP 134/78 | HR 68 | Temp 98.0°F | Resp 18 | Wt 230.0 lb

## 2017-07-09 DIAGNOSIS — Z170 Estrogen receptor positive status [ER+]: Secondary | ICD-10-CM

## 2017-07-09 DIAGNOSIS — E039 Hypothyroidism, unspecified: Secondary | ICD-10-CM

## 2017-07-09 DIAGNOSIS — C50212 Malignant neoplasm of upper-inner quadrant of left female breast: Secondary | ICD-10-CM

## 2017-07-09 DIAGNOSIS — Z79899 Other long term (current) drug therapy: Secondary | ICD-10-CM

## 2017-07-09 DIAGNOSIS — Z5111 Encounter for antineoplastic chemotherapy: Secondary | ICD-10-CM

## 2017-07-09 DIAGNOSIS — M3500 Sicca syndrome, unspecified: Secondary | ICD-10-CM | POA: Diagnosis not present

## 2017-07-09 DIAGNOSIS — Z803 Family history of malignant neoplasm of breast: Secondary | ICD-10-CM | POA: Diagnosis not present

## 2017-07-09 DIAGNOSIS — K219 Gastro-esophageal reflux disease without esophagitis: Secondary | ICD-10-CM | POA: Diagnosis not present

## 2017-07-09 DIAGNOSIS — E282 Polycystic ovarian syndrome: Secondary | ICD-10-CM

## 2017-07-09 DIAGNOSIS — Z87891 Personal history of nicotine dependence: Secondary | ICD-10-CM | POA: Diagnosis not present

## 2017-07-09 DIAGNOSIS — Z8041 Family history of malignant neoplasm of ovary: Secondary | ICD-10-CM

## 2017-07-09 DIAGNOSIS — Z801 Family history of malignant neoplasm of trachea, bronchus and lung: Secondary | ICD-10-CM | POA: Diagnosis not present

## 2017-07-09 DIAGNOSIS — F419 Anxiety disorder, unspecified: Secondary | ICD-10-CM

## 2017-07-09 DIAGNOSIS — Z808 Family history of malignant neoplasm of other organs or systems: Secondary | ICD-10-CM

## 2017-07-09 DIAGNOSIS — Z800 Family history of malignant neoplasm of digestive organs: Secondary | ICD-10-CM

## 2017-07-09 LAB — COMPREHENSIVE METABOLIC PANEL
ALK PHOS: 92 U/L (ref 38–126)
ALT: 17 U/L (ref 14–54)
ANION GAP: 6 (ref 5–15)
AST: 21 U/L (ref 15–41)
Albumin: 3.5 g/dL (ref 3.5–5.0)
BILIRUBIN TOTAL: 0.5 mg/dL (ref 0.3–1.2)
BUN: 21 mg/dL — ABNORMAL HIGH (ref 6–20)
CALCIUM: 8.8 mg/dL — AB (ref 8.9–10.3)
CO2: 28 mmol/L (ref 22–32)
Chloride: 103 mmol/L (ref 101–111)
Creatinine, Ser: 0.74 mg/dL (ref 0.44–1.00)
Glucose, Bld: 140 mg/dL — ABNORMAL HIGH (ref 65–99)
Potassium: 4 mmol/L (ref 3.5–5.1)
Sodium: 137 mmol/L (ref 135–145)
TOTAL PROTEIN: 6.6 g/dL (ref 6.5–8.1)

## 2017-07-09 LAB — CBC WITH DIFFERENTIAL/PLATELET
BASOS ABS: 0 10*3/uL (ref 0–0.1)
BASOS PCT: 0 %
Eosinophils Absolute: 0.1 10*3/uL (ref 0–0.7)
Eosinophils Relative: 3 %
HEMATOCRIT: 37.6 % (ref 35.0–47.0)
HEMOGLOBIN: 12.7 g/dL (ref 12.0–16.0)
Lymphocytes Relative: 37 %
Lymphs Abs: 1.5 10*3/uL (ref 1.0–3.6)
MCH: 31.1 pg (ref 26.0–34.0)
MCHC: 33.8 g/dL (ref 32.0–36.0)
MCV: 92.2 fL (ref 80.0–100.0)
MONO ABS: 0.4 10*3/uL (ref 0.2–0.9)
Monocytes Relative: 9 %
NEUTROS ABS: 2 10*3/uL (ref 1.4–6.5)
NEUTROS PCT: 51 %
Platelets: 323 10*3/uL (ref 150–440)
RBC: 4.08 MIL/uL (ref 3.80–5.20)
RDW: 13.8 % (ref 11.5–14.5)
WBC: 4.1 10*3/uL (ref 3.6–11.0)

## 2017-07-09 MED ORDER — SODIUM CHLORIDE 0.9 % IV SOLN
Freq: Once | INTRAVENOUS | Status: AC
  Administered 2017-07-09: 10:00:00 via INTRAVENOUS
  Filled 2017-07-09: qty 1000

## 2017-07-09 MED ORDER — DEXAMETHASONE 4 MG PO TABS: 8.0000 mg | ORAL_TABLET | Freq: Two times a day (BID) | ORAL | 1 refills | Status: DC

## 2017-07-09 MED ORDER — HEPARIN SOD (PORK) LOCK FLUSH 100 UNIT/ML IV SOLN
500.0000 [IU] | Freq: Once | INTRAVENOUS | Status: AC
  Administered 2017-07-09: 500 [IU] via INTRAVENOUS
  Filled 2017-07-09: qty 5

## 2017-07-09 MED ORDER — HEPARIN SOD (PORK) LOCK FLUSH 100 UNIT/ML IV SOLN
INTRAVENOUS | Status: AC
  Filled 2017-07-09: qty 5

## 2017-07-09 MED ORDER — PEGFILGRASTIM 6 MG/0.6ML ~~LOC~~ PSKT
6.0000 mg | PREFILLED_SYRINGE | Freq: Once | SUBCUTANEOUS | Status: AC
  Administered 2017-07-09: 6 mg via SUBCUTANEOUS
  Filled 2017-07-09: qty 0.6

## 2017-07-09 MED ORDER — LIDOCAINE-PRILOCAINE 2.5-2.5 % EX CREA: TOPICAL_CREAM | CUTANEOUS | 3 refills | Status: DC

## 2017-07-09 MED ORDER — DEXAMETHASONE SODIUM PHOSPHATE 10 MG/ML IJ SOLN
10.0000 mg | Freq: Once | INTRAMUSCULAR | Status: AC
  Administered 2017-07-09: 10 mg via INTRAVENOUS
  Filled 2017-07-09: qty 1

## 2017-07-09 MED ORDER — PROCHLORPERAZINE MALEATE 10 MG PO TABS: 10.0000 mg | ORAL_TABLET | Freq: Four times a day (QID) | ORAL | 1 refills | Status: DC | PRN

## 2017-07-09 MED ORDER — DOCETAXEL CHEMO INJECTION 160 MG/16ML
75.0000 mg/m2 | Freq: Once | INTRAVENOUS | Status: AC
  Administered 2017-07-09: 160 mg via INTRAVENOUS
  Filled 2017-07-09: qty 16

## 2017-07-09 MED ORDER — PALONOSETRON HCL INJECTION 0.25 MG/5ML
0.2500 mg | Freq: Once | INTRAVENOUS | Status: AC
  Administered 2017-07-09: 0.25 mg via INTRAVENOUS
  Filled 2017-07-09: qty 5

## 2017-07-09 MED ORDER — CYCLOPHOSPHAMIDE CHEMO INJECTION 1 GM
600.0000 mg/m2 | Freq: Once | INTRAMUSCULAR | Status: AC
  Administered 2017-07-09: 1260 mg via INTRAVENOUS
  Filled 2017-07-09: qty 50

## 2017-07-09 MED ORDER — SODIUM CHLORIDE 0.9 % IV SOLN: 10.0000 mg | Freq: Once | INTRAVENOUS | Status: DC

## 2017-07-09 MED ORDER — CHLORHEXIDINE GLUCONATE 0.12 % MT SOLN: 15.0000 mL | Freq: Two times a day (BID) | OROMUCOSAL | 0 refills | Status: DC

## 2017-07-09 MED ORDER — SODIUM CHLORIDE 0.9% FLUSH
10.0000 mL | INTRAVENOUS | Status: DC | PRN
  Administered 2017-07-09: 10 mL via INTRAVENOUS
  Filled 2017-07-09: qty 10

## 2017-07-09 MED ORDER — ONDANSETRON HCL 8 MG PO TABS: 8.0000 mg | ORAL_TABLET | Freq: Two times a day (BID) | ORAL | 1 refills | Status: DC | PRN

## 2017-07-09 NOTE — Progress Notes (Signed)
Patient here for labs and initial treatment with TC and Zometa. She states that she is feeling well today and denies having any pain. She had her port placed yesterday and it is still sore. She did not know about the dexamethasone that she was supposed to take yesterday, because no one told her to get it.

## 2017-07-09 NOTE — Anesthesia Postprocedure Evaluation (Signed)
Anesthesia Post Note  Patient: Sheena Martinez  Procedure(s) Performed: INSERTION PORT-A-CATH (Right )  Patient location during evaluation: PACU Anesthesia Type: General Level of consciousness: awake and alert Pain management: pain level controlled Vital Signs Assessment: post-procedure vital signs reviewed and stable Respiratory status: spontaneous breathing, nonlabored ventilation, respiratory function stable and patient connected to nasal cannula oxygen Cardiovascular status: blood pressure returned to baseline and stable Postop Assessment: no apparent nausea or vomiting Anesthetic complications: no     Last Vitals:  Vitals:   07/08/17 0915 07/08/17 0926  BP: 124/73 125/69  Pulse: 66 68  Resp: 16 16  Temp: 36.7 C   SpO2: 99% 100%    Last Pain:  Vitals:   07/09/17 0856  TempSrc:   PainSc: 0-No pain                 Molli Barrows

## 2017-07-14 ENCOUNTER — Encounter: Payer: Self-pay | Admitting: Oncology

## 2017-07-15 ENCOUNTER — Other Ambulatory Visit: Payer: Self-pay | Admitting: Oncology

## 2017-07-15 ENCOUNTER — Telehealth: Payer: Self-pay

## 2017-07-15 MED ORDER — OMEPRAZOLE 20 MG PO CPDR: 20.0000 mg | DELAYED_RELEASE_CAPSULE | Freq: Every day | ORAL | 1 refills | Status: DC

## 2017-07-15 MED ORDER — LOPERAMIDE HCL 2 MG PO CAPS: 2.0000 mg | ORAL_CAPSULE | ORAL | 1 refills | Status: DC

## 2017-07-15 NOTE — Telephone Encounter (Signed)
Patient called to state she had experienced diarrhea overnight.  She has immodium AD but has not taken it yet.  She wanted to make sure it was ok.  Patient was advised to take immodium after each episode of diarrhea today.  The patient has an appointment to see Dr. Tasia Catchings tomorrow at 3:45.  Patient instructed to call in am if symptoms have not improved.

## 2017-07-15 NOTE — Progress Notes (Addendum)
Hematology/Oncology Follow up note [facility] [phone] [phone]   Patient Care Team: Herminio Commons, MD as PCP - General (Family Medicine) Rico Junker, RN as Registered Nurse Theodore Demark, RN as Registered Nurse  REFERRING PROVIDER: Herminio Commons, MD REASON FOR VISIT Follow up for treatment of  Breast cancer  HISTORY OF PRESENTING ILLNESS:  Sheena Martinez is a @ 56 y.o.  female with PMH listed below who was referred to me for evaluation of newly diagnosed breast cancer. Patient reports that she is been doing fine and she underwent screening annual mammogram which discovered abdominal findings.09/19/2018patient underwent screening, bilateral mammogram warrant further evaluation.on 05/12/2017, patient underwent diagnostic mammogram which discoveredleft breast irregular mass was spiculated margins in the upper slightly inner left breast. Ultrasound of the superior left breast showed hypoechoic mass with spiculated margins at 11:00, 4 cm from the nipple measuring 574 mm. Patient underwent biopsy of the left breast mass, pathology showed invasive mammary carcinoma. ER/PR >+90%, HER-2 IHC +2 equivocal, fish negative. Patient has been evaluated by Dr. Bary Castilla this week. Patient presented to the clinic today, accomplished by her daughter-in-law to establish care. Patient overall reports feeling well. She has a positive family history of breast cancer in her maternal aunt. 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 She does not have insurance pending applying Medicaid. She has a history of Sjogren disease for which she has had tear duct surgery. She also reports history of PCOS with elevated testosterone level. She is perimenopausal, her last menstrual period reported as 3 months ago, last for about 3 months. Denies any hormonal replacement therapy.   Pathology revealed a 6 mm ER PR positive,  HER-2 negative left invasive mammary carcinoma, margin is negative, no LV I. Pathological stage and pT1a N0. Mammoprintr was sent during intervaland came back with 10 year recurrence rate at 29% high risk group. Patient has completed MammoSite RT   INTERVAL HISTORY Patient  Presented for evaluation of toxicities after first cycle of TC. She had diarrhea which improves with imodium PRN. Also back spasm which is improved with tylenol.  Reports that the roof of her mouth has a strange sensitivity. Very transient left index finger numbness and spontaneously improved. Mild nausea without nausea. Improve with zofran.    Review of Systems  Constitutional: Negative for chills, fever and malaise/fatigue.  HENT: Negative for hearing loss and tinnitus.   Eyes: Negative for blurred vision, double vision and pain.  Respiratory: Negative for cough, hemoptysis and sputum production.   Cardiovascular: Negative for chest pain, palpitations and orthopnea.  Gastrointestinal: Positive for diarrhea and nausea. Negative for heartburn.  Genitourinary: Negative for dysuria, frequency and urgency.  Musculoskeletal: Negative for myalgias and neck pain.  Skin: Negative for itching and rash.  Neurological: Negative for dizziness, tingling and headaches.  Endo/Heme/Allergies: Negative for environmental allergies. Does not bruise/bleed easily.  Psychiatric/Behavioral: Negative for depression.   MEDICAL HISTORY:  Past Medical History:  Diagnosis Date  . Anxiety   . Arthritis   . Breast cancer ([place]) 05/12/2017   INVASIVE MAMMARY CARCINOMA ER/PR positive LEFT BREAST UPPER inner  QUAD  . GERD (gastroesophageal reflux disease)    OCC  . Hypothyroidism   . Sjogren's syndrome ([place])     SURGICAL HISTORY: Past Surgical History:  Procedure Laterality Date  . BREAST BIOPSY Left 05/12/2017   [place] bx/ INVASIVE MAMMARY CARCINOMA  . BREAST LUMPECTOMY WITH SENTINEL LYMPH  NODE BIOPSY Left 06/10/2017   Procedure: BREAST  LUMPECTOMY WITH SENTINEL LYMPH NODE BX AND NEEDLE LOCALIZATION;  Surgeon: Robert Bellow, MD;  Location: [facility] ORS;  Service: General;  Laterality: Left;  . BREAST MAMMOSITE Left 06/24/2017   Procedure: MAMMOSITE BREAST;  Surgeon: Robert Bellow, MD;  Location: [facility] ORS;  Service: General;  Laterality: Left;  . CESAREAN SECTION  1995  . COSMETIC SURGERY    . CYST EXCISION  2018   pilar cyst/ Dr Will Bonnet  . PORTACATH PLACEMENT Right 07/08/2017   Procedure: INSERTION PORT-A-CATH;  Surgeon: Robert Bellow, MD;  Location: [facility] ORS;  Service: General;  Laterality: Right;  . TONSILLECTOMY      SOCIAL HISTORY: Social History   Socioeconomic History  . Marital status: Married    Spouse name: Not on file  . Number of children: Not on file  . Years of education: Not on file  . Highest education level: Not on file  Social Needs  . Financial resource strain: Not on file  . Food insecurity - worry: Never true  . Food insecurity - inability: Never true  . Transportation needs - medical: No  . Transportation needs - non-medical: No  Occupational History  . Not on file  Tobacco Use  . Smoking status: Former Smoker    Years: 7.00    Types: Cigarettes    Last attempt to quit: 08/13/1979    Years since quitting: 37.9  . Smokeless tobacco: Never Used  . Tobacco comment: AGE [age]  Substance and Sexual Activity  . Alcohol use: No  . Drug use: No  . Sexual activity: Yes  Other Topics Concern  . Not on file  Social History Narrative  . Not on file    FAMILY HISTORY: Family History  Problem Relation Age of Onset  . Breast cancer Maternal Aunt 48       currently ~65  . Diabetes Mother   . Skin cancer Mother        currently 55; TAH/BSO (age?)  . Anemia Mother   . Liver disease Father        deceased / not much info about him / alcoholic  . Lung cancer Paternal Aunt        smoker / deceased 56s  . Stomach cancer Paternal Uncle 22       deceased / deceased 87s  . Ovarian  cancer Maternal Grandmother 38       deceased 52s  . Stomach cancer Paternal Grandfather 5       deceased 100s  . Cancer Maternal Uncle        unk. type; deceased 41s  . Leukemia Cousin 84       deceased 77    ALLERGIES:  is allergic to sulfamethoxazole-trimethoprim.  MEDICATIONS:  Current Outpatient Medications  Medication Sig Dispense Refill  . acetaminophen (TYLENOL) 500 MG tablet Take 1,000 mg by mouth every 8 (eight) hours as needed for mild pain or headache.    . Boric Acid POWD Place 1 capsule daily as needed vaginally (for pH levels).     . Ca Carbonate-Mag Hydroxide (ROLAIDS PO) Take 1 tablet as needed by mouth (indigestion).     . Carboxymethylcell-Hypromellose (GENTEAL OP) Apply 1 drop to eye 2 (two) times daily.    . chlorhexidine (PERIDEX) 0.12 % solution Use as directed 15 mLs in the mouth or throat 2 (two) times daily. 473 mL 0  . clotrimazole-betamethasone (LOTRISONE) cream Apply 1 application 2 (two) times a  week topically.    Marland Kitchen dexamethasone (DECADRON) 4 MG tablet Take 2 tablets (8 mg total) by mouth 2 (two) times daily. Start the day before Taxotere. Then again the day after chemo for 3 days. (Patient not taking: Reported on 07/09/2017) 30 tablet 1  . HYDROcodone-acetaminophen (NORCO) 5-325 MG tablet Take 1-2 tablets by mouth every 4 (four) hours as needed for moderate pain. Take no more than 10 tabs/ day. (Patient taking differently: Take 1 tablet every 4 (four) hours as needed by mouth for moderate pain. Take no more than 10 tabs/ day.) 30 tablet 0  . ketoconazole (NIZORAL) 2 % shampoo Apply 1 application topically 2 (two) times a week.    . lidocaine-prilocaine (EMLA) cream Apply to affected area once (Patient not taking: Reported on 07/09/2017) 30 g 3  . meloxicam (MOBIC) 15 MG tablet Take 15 mg by mouth at bedtime.     . ondansetron (ZOFRAN) 8 MG tablet Take 1 tablet (8 mg total) by mouth 2 (two) times daily as needed for refractory nausea / vomiting. Start on day 3  after chemo. (Patient not taking: Reported on 07/09/2017) 30 tablet 1  . PARoxetine (PAXIL) 20 MG tablet Take 20 mg by mouth at bedtime.     . prednisoLONE sodium phosphate (INFLAMASE FORTE) 1 % ophthalmic solution Place 1 drop 2 (two) times daily as needed into the left eye (eye infection).     . prochlorperazine (COMPAZINE) 10 MG tablet Take 1 tablet (10 mg total) by mouth every 6 (six) hours as needed (Nausea or vomiting). (Patient not taking: Reported on 07/09/2017) 30 tablet 1   No current facility-administered medications for this visit.       Marland Kitchen  PHYSICAL EXAMINATION: ECOG PERFORMANCE STATUS: 1 - Symptomatic but completely ambulatory Vital sign stable.  ECOG 0 GENERAL: No distress, well nourished.  SKIN:  No rashes or significant lesions  HEAD: Normocephalic, No masses, lesions, tenderness or abnormalities  EYES: Conjunctiva are pink, non icteric ENT: External ears normal ,lips , buccal mucosa, and tongue normal and mucous membranes are moist  LYMPH: No palpable cervical and axillary lymphadenopathy  LUNGS: Clear to auscultation, no crackles or wheezes HEART: Regular rate & rhythm, no murmurs, no gallops, S1 normal and S2 normal  ABDOMEN: Abdomen soft, non-tender, normal bowel sounds, I did not appreciate any  masses or organomegaly  MUSCULOSKELETAL: No CVA tenderness and no tenderness on percussion of the back or rib cage.  EXTREMITIES: No edema, no skin discoloration or tenderness NEURO: Alert & oriented, no focal motor/sensory deficits.  Breast exam incision well-healed. Can not palpate any lymphadenopathy or mass.  LABORATORY DATA:  I have reviewed the data as listed: no recent labs.  Lab Results  Component Value Date   WBC 4.1 07/09/2017   HGB 12.7 07/09/2017   HCT 37.6 07/09/2017   MCV 92.2 07/09/2017   PLT 323 07/09/2017   Recent Labs    06/25/17 1235 06/30/17 1433 07/09/17 0858  NA 135 136 137  K 4.5 4.3 4.0  CL 103 103 103  CO2 '27 27 28  '$ GLUCOSE 81 93  140*  BUN 17 21* 21*  CREATININE 0.62 0.70 0.74  CALCIUM 9.4 9.2 8.8*  GFRNONAA >60 >60 >60  GFRAA >60 >60 >60  PROT 7.0 7.0 6.6  ALBUMIN 3.6 3.5 3.5  AST '19 18 21  '$ ALT '21 17 17  '$ ALKPHOS 103 97 92  BILITOT 0.4 0.4 0.5     Genetic testing result vis INV[facility] showed no pathogenic  sequence appearance or deletions /duplications.   ASSESSMENT & PLAN:  Cancer Staging Malignant neoplasm of upper-inner quadrant of left breast in female, estrogen receptor positive ([place]) Staging form: Breast, AJCC 8th Edition - Clinical stage from 05/22/2017: Stage IA (cT1, cN0, cM0, G1, ER: Positive, PR: Positive, HER2: Negative) - Signed by Earlie Server, MD on 05/22/2017  1. Malignant neoplasm of upper-inner quadrant of left breast in female, estrogen receptor positive ([place])   2. Drug-induced neutropenia ([facility])   3. Diarrhea, unspecified type     #tolerate cycle 1 Taxotere and Cytoxan + onpro.   # advise patient to continue use imodium OTC as instructed  # Patient needs dental work done including pulling a teeth and add a crown. She is currently asymptomatic. Advise her to wait until chemotherapy is completed and then do the dental work and obtain dental clearance for adjuvant zometa.  # Neutropenia, afebrile. Use Peridex PRN mouth wash.   Return of visit: 2 weeks Lab/MD/ TC   Earlie Server, MD, PhD Hematology Oncology [facility] at [facility] Pager- [phone] 07/15/2017

## 2017-07-16 ENCOUNTER — Other Ambulatory Visit: Payer: Self-pay

## 2017-07-16 ENCOUNTER — Inpatient Hospital Stay: Payer: Medicaid Other | Attending: Oncology

## 2017-07-16 ENCOUNTER — Inpatient Hospital Stay (HOSPITAL_BASED_OUTPATIENT_CLINIC_OR_DEPARTMENT_OTHER): Payer: Medicaid Other | Admitting: Oncology

## 2017-07-16 ENCOUNTER — Encounter: Payer: Self-pay | Admitting: Oncology

## 2017-07-16 VITALS — BP 115/75 | HR 105 | Temp 97.5°F | Wt 222.5 lb

## 2017-07-16 DIAGNOSIS — G4700 Insomnia, unspecified: Secondary | ICD-10-CM | POA: Diagnosis not present

## 2017-07-16 DIAGNOSIS — Z806 Family history of leukemia: Secondary | ICD-10-CM | POA: Insufficient documentation

## 2017-07-16 DIAGNOSIS — R197 Diarrhea, unspecified: Secondary | ICD-10-CM | POA: Diagnosis not present

## 2017-07-16 DIAGNOSIS — F419 Anxiety disorder, unspecified: Secondary | ICD-10-CM | POA: Insufficient documentation

## 2017-07-16 DIAGNOSIS — L538 Other specified erythematous conditions: Secondary | ICD-10-CM | POA: Diagnosis not present

## 2017-07-16 DIAGNOSIS — Z87891 Personal history of nicotine dependence: Secondary | ICD-10-CM | POA: Diagnosis not present

## 2017-07-16 DIAGNOSIS — Z7689 Persons encountering health services in other specified circumstances: Secondary | ICD-10-CM | POA: Insufficient documentation

## 2017-07-16 DIAGNOSIS — Z803 Family history of malignant neoplasm of breast: Secondary | ICD-10-CM

## 2017-07-16 DIAGNOSIS — C50212 Malignant neoplasm of upper-inner quadrant of left female breast: Secondary | ICD-10-CM

## 2017-07-16 DIAGNOSIS — D473 Essential (hemorrhagic) thrombocythemia: Secondary | ICD-10-CM | POA: Diagnosis not present

## 2017-07-16 DIAGNOSIS — Z8041 Family history of malignant neoplasm of ovary: Secondary | ICD-10-CM | POA: Diagnosis not present

## 2017-07-16 DIAGNOSIS — R110 Nausea: Secondary | ICD-10-CM | POA: Diagnosis not present

## 2017-07-16 DIAGNOSIS — D709 Neutropenia, unspecified: Secondary | ICD-10-CM | POA: Insufficient documentation

## 2017-07-16 DIAGNOSIS — M3500 Sicca syndrome, unspecified: Secondary | ICD-10-CM | POA: Insufficient documentation

## 2017-07-16 DIAGNOSIS — K219 Gastro-esophageal reflux disease without esophagitis: Secondary | ICD-10-CM

## 2017-07-16 DIAGNOSIS — Z170 Estrogen receptor positive status [ER+]: Secondary | ICD-10-CM | POA: Diagnosis not present

## 2017-07-16 DIAGNOSIS — E039 Hypothyroidism, unspecified: Secondary | ICD-10-CM | POA: Diagnosis not present

## 2017-07-16 DIAGNOSIS — E282 Polycystic ovarian syndrome: Secondary | ICD-10-CM | POA: Insufficient documentation

## 2017-07-16 DIAGNOSIS — M129 Arthropathy, unspecified: Secondary | ICD-10-CM

## 2017-07-16 DIAGNOSIS — Z5111 Encounter for antineoplastic chemotherapy: Secondary | ICD-10-CM | POA: Diagnosis not present

## 2017-07-16 DIAGNOSIS — Z801 Family history of malignant neoplasm of trachea, bronchus and lung: Secondary | ICD-10-CM | POA: Insufficient documentation

## 2017-07-16 DIAGNOSIS — Z800 Family history of malignant neoplasm of digestive organs: Secondary | ICD-10-CM

## 2017-07-16 DIAGNOSIS — D702 Other drug-induced agranulocytosis: Secondary | ICD-10-CM

## 2017-07-16 LAB — COMPREHENSIVE METABOLIC PANEL
ALT: 35 U/L (ref 14–54)
AST: 31 U/L (ref 15–41)
Albumin: 3.2 g/dL — ABNORMAL LOW (ref 3.5–5.0)
Alkaline Phosphatase: 110 U/L (ref 38–126)
Anion gap: 7 (ref 5–15)
BILIRUBIN TOTAL: 0.6 mg/dL (ref 0.3–1.2)
BUN: 20 mg/dL (ref 6–20)
CHLORIDE: 102 mmol/L (ref 101–111)
CO2: 25 mmol/L (ref 22–32)
CREATININE: 0.9 mg/dL (ref 0.44–1.00)
Calcium: 8.6 mg/dL — ABNORMAL LOW (ref 8.9–10.3)
Glucose, Bld: 107 mg/dL — ABNORMAL HIGH (ref 65–99)
POTASSIUM: 3.9 mmol/L (ref 3.5–5.1)
Sodium: 134 mmol/L — ABNORMAL LOW (ref 135–145)
TOTAL PROTEIN: 6 g/dL — AB (ref 6.5–8.1)

## 2017-07-16 LAB — CBC WITH DIFFERENTIAL/PLATELET
BASOS ABS: 0.2 10*3/uL — AB (ref 0–0.1)
Basophils Relative: 1 %
Eosinophils Absolute: 0 10*3/uL (ref 0–0.7)
Eosinophils Relative: 0 %
HCT: 38.3 % (ref 35.0–47.0)
Hemoglobin: 13 g/dL (ref 12.0–16.0)
Lymphocytes Relative: 10 %
Lymphs Abs: 1.6 10*3/uL (ref 1.0–3.6)
MCH: 30.8 pg (ref 26.0–34.0)
MCHC: 33.8 g/dL (ref 32.0–36.0)
MCV: 91 fL (ref 80.0–100.0)
MONO ABS: 1.1 10*3/uL — AB (ref 0.2–0.9)
MONOS PCT: 7 %
NEUTROS PCT: 82 %
Neutro Abs: 12.6 10*3/uL — ABNORMAL HIGH (ref 1.4–6.5)
PLATELETS: 161 10*3/uL (ref 150–440)
RBC: 4.21 MIL/uL (ref 3.80–5.20)
RDW: 13.9 % (ref 11.5–14.5)
WBC: 15.5 10*3/uL — AB (ref 3.6–11.0)

## 2017-07-16 NOTE — Progress Notes (Signed)
  Oncology Nurse Navigator Documentation  Navigator Location: [facility]-Med Onc (07/09/17 1153)   )Navigator Encounter Type: Treatment;Clinic/MDC (07/09/17 1153)                     Patient Visit Type: MedOnc (07/09/17 1153) Treatment Phase: First Chemo Tx (07/09/17 1153) Barriers/Navigation Needs: No barriers at this time;Education (07/09/17 1153)                          Time Spent with Patient: 30 (07/09/17 1153)   Met patient in infusion suite.  Reports she tolerated radiation well.  Genetic testing reported as negative. Dr. Tasia Catchings aware.

## 2017-07-16 NOTE — Progress Notes (Signed)
Patient here today for follow up.  Patient c/o some lower back spams which has been relieved by tylenol, night sweats, trouble sleeping for a few days after chemo treatment last week, mouth sores that have been relieved by peridex, diarrhea that has been relieve with imodium, and body aches that has been relived by tylenol.

## 2017-07-18 ENCOUNTER — Telehealth: Payer: Self-pay | Admitting: *Deleted

## 2017-07-18 MED ORDER — ZOLPIDEM TARTRATE 5 MG PO TABS: 5.0000 mg | ORAL_TABLET | Freq: Every evening | ORAL | 0 refills | Status: DC | PRN

## 2017-07-18 NOTE — Telephone Encounter (Signed)
Informed that prescription was faxed. Left message on voice mail

## 2017-07-18 NOTE — Telephone Encounter (Signed)
Can you prescribe ambien 5mg  QHS PRN? Thank you.

## 2017-07-18 NOTE — Telephone Encounter (Signed)
Patient called to report that she did not sleep last night and is not tired this morning and is asking for medicine to help her sleep. Reports that she slept well the night before and that the only other time she had difficulty sleeping was when she took steroids. She has not tried any Melatonin or other over the counter medicine for sleep. Please advise

## 2017-07-22 ENCOUNTER — Telehealth: Payer: Self-pay | Admitting: *Deleted

## 2017-07-22 NOTE — Telephone Encounter (Signed)
Patient called reporting that she is having side effects but did not say what and when I return her call I get voice mail. Left message to return call with details of side effects

## 2017-07-23 ENCOUNTER — Other Ambulatory Visit: Payer: Self-pay | Admitting: Oncology

## 2017-07-23 DIAGNOSIS — R21 Rash and other nonspecific skin eruption: Secondary | ICD-10-CM

## 2017-07-23 MED ORDER — HYDROCORTISONE 1 % EX OINT: 1.0000 "application " | TOPICAL_OINTMENT | Freq: Two times a day (BID) | CUTANEOUS | 0 refills | Status: DC

## 2017-07-23 MED ORDER — HYDROCORTISONE 1 % EX CREA: TOPICAL_CREAM | Freq: Two times a day (BID) | CUTANEOUS | Status: DC

## 2017-07-23 NOTE — Telephone Encounter (Signed)
Called patient and talked to her. ? Drug rash vs acne at her age, probably former. Trial of hydrocortison 1% topical BID.  Patient to call if not improving or getting worse.

## 2017-07-23 NOTE — Telephone Encounter (Signed)
Patient called back reporting that she has acne on her face and that she is having blood streaked mucous from nostrils. Recommended that she use a humidifier and that I would send message to physician regarding acneform rash

## 2017-07-24 ENCOUNTER — Inpatient Hospital Stay (HOSPITAL_BASED_OUTPATIENT_CLINIC_OR_DEPARTMENT_OTHER): Payer: Medicaid Other | Admitting: Oncology

## 2017-07-24 ENCOUNTER — Telehealth: Payer: Self-pay | Admitting: *Deleted

## 2017-07-24 DIAGNOSIS — D709 Neutropenia, unspecified: Secondary | ICD-10-CM | POA: Diagnosis not present

## 2017-07-24 DIAGNOSIS — M129 Arthropathy, unspecified: Secondary | ICD-10-CM | POA: Diagnosis not present

## 2017-07-24 DIAGNOSIS — Z806 Family history of leukemia: Secondary | ICD-10-CM

## 2017-07-24 DIAGNOSIS — E282 Polycystic ovarian syndrome: Secondary | ICD-10-CM

## 2017-07-24 DIAGNOSIS — Z803 Family history of malignant neoplasm of breast: Secondary | ICD-10-CM

## 2017-07-24 DIAGNOSIS — E039 Hypothyroidism, unspecified: Secondary | ICD-10-CM | POA: Diagnosis not present

## 2017-07-24 DIAGNOSIS — Z5111 Encounter for antineoplastic chemotherapy: Secondary | ICD-10-CM | POA: Diagnosis not present

## 2017-07-24 DIAGNOSIS — Z170 Estrogen receptor positive status [ER+]: Secondary | ICD-10-CM | POA: Diagnosis not present

## 2017-07-24 DIAGNOSIS — L538 Other specified erythematous conditions: Secondary | ICD-10-CM | POA: Diagnosis not present

## 2017-07-24 DIAGNOSIS — G4700 Insomnia, unspecified: Secondary | ICD-10-CM | POA: Diagnosis not present

## 2017-07-24 DIAGNOSIS — R110 Nausea: Secondary | ICD-10-CM | POA: Diagnosis not present

## 2017-07-24 DIAGNOSIS — C50212 Malignant neoplasm of upper-inner quadrant of left female breast: Secondary | ICD-10-CM | POA: Diagnosis not present

## 2017-07-24 DIAGNOSIS — F419 Anxiety disorder, unspecified: Secondary | ICD-10-CM

## 2017-07-24 DIAGNOSIS — Z87891 Personal history of nicotine dependence: Secondary | ICD-10-CM

## 2017-07-24 DIAGNOSIS — Z800 Family history of malignant neoplasm of digestive organs: Secondary | ICD-10-CM

## 2017-07-24 DIAGNOSIS — Z801 Family history of malignant neoplasm of trachea, bronchus and lung: Secondary | ICD-10-CM

## 2017-07-24 DIAGNOSIS — M3500 Sicca syndrome, unspecified: Secondary | ICD-10-CM

## 2017-07-24 DIAGNOSIS — R197 Diarrhea, unspecified: Secondary | ICD-10-CM

## 2017-07-24 DIAGNOSIS — Z8041 Family history of malignant neoplasm of ovary: Secondary | ICD-10-CM

## 2017-07-24 DIAGNOSIS — K219 Gastro-esophageal reflux disease without esophagitis: Secondary | ICD-10-CM

## 2017-07-24 MED ORDER — ZOLPIDEM TARTRATE 5 MG PO TABS: 5.0000 mg | ORAL_TABLET | Freq: Every evening | ORAL | 1 refills | Status: DC | PRN

## 2017-07-24 MED ORDER — MUPIROCIN 2 % EX OINT: 1.0000 "application " | TOPICAL_OINTMENT | Freq: Two times a day (BID) | CUTANEOUS | 0 refills | Status: DC

## 2017-07-24 NOTE — Progress Notes (Addendum)
Symptom Management Consult note [facility]  [phone] [phone]  Patient Care Team: Herminio Commons, MD as PCP - General (Family Medicine) Rico Junker, RN as Registered Nurse Theodore Demark, RN as Registered Nurse   Name of the patient: Sheena Martinez  [id number]  12/28/1960   Date of visit: 07/24/17  Diagnosis- Breast Cancer   Chief complaint/ Reason for visit- Redness above PORT site   Heme/Onc history: Sheena Martinez is a @ 56 y.o.  female with PMH listed below who was referred to me for evaluation of newly diagnosed breast cancer. Patient reports that she is been doing fine and she underwent screening annual mammogram which discovered abdominal findings.09/19/2018patient underwent screening, bilateral mammogram warrant further evaluation.on 05/12/2017, patient underwent diagnostic mammogram which discoveredleft breast irregular mass was spiculated margins in the upper slightly inner left breast. Ultrasound of the superior left breast showed hypoechoic mass with spiculated margins at 11:00, 4 cm from the nipple measuring 574 mm.  Patient underwent biopsy of the left breast mass, pathology showed invasive mammary carcinoma. ER/PR >+90%, HER-2 IHC +2 equivocal, fish negative. Patient has been evaluated by Dr. Bary Castilla this week. Patient presented to the clinic today, accomplished by her daughter-in-law to establish care. Patient overall reports feeling well. She has a positive family history of breast cancer in her maternal aunt. 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 She does not have insurance pending applying Medicaid. She has a history of Sjogren disease for which she has had tear duct surgery. She also reports history of PCOS with elevated testosterone level. She is perimenopausal, her last menstrual period reported as 3 months ago, last for about 3 months. Denies any hormonal  replacement therapy.   Pathology revealed a 6 mm ER PR positive, HER-2 negative left invasive mammary carcinoma, margin is negative, no LV I. Pathological stage and pT1a N0. Mammoprintr was sent during intervaland came back with 10 year recurrence rate at 29% high risk group. Patient has completed MammoSite RT  Interval history- Patient presents today for  Rash above her port site. Her port was inserted on 07/08/17 by Dr. Fleet Contras without incident or infection. Placement was verified. Patient was seen by Dr. Tasia Catchings on 07/09/2017 and 07/16/17 for evaluation of toxicities after her first 2 cycles of TC. Her only complaint was mild diarrhea which improved with Imodium and back spasms improved with Tylenol. She did complain of mild nausea and vomiting that was improved with Zofran. Patient also received on prior Neulasta. She was instructed to take OTC Claritin and Tylenol.  Today patient presents with redness and drainage above port. Patient states approximately 2 days ago she removed the Steri-Strips that was covering this area and pulled a scab and a small amount of white/yellow drainage was observed. She quickly placed a new Steri-Strip on top. The next day she noticed redness surrounding the site and became worried given her immune compromised status. This area is not painful. She has not developed any fevers and no more drainage has been observed. She has not tried any cream or intervention for this problem.  ECOG FS:0 - Asymptomatic  Review of systems- Review of Systems  Constitutional: Negative for chills, fever, malaise/fatigue and weight loss.  HENT: Negative.   Eyes: Negative.   Respiratory: Negative.  Negative for cough.   Cardiovascular: Negative.   Gastrointestinal: Positive for diarrhea.  Genitourinary: Negative.   Musculoskeletal: Negative.  Skin: Positive for rash.  Neurological: Negative.  Negative for weakness.  Endo/Heme/Allergies: Negative.   Psychiatric/Behavioral: Negative.        Current treatment- Taxotere, Cytoxan and Onpro  Allergies  Allergen Reactions  . Sulfamethoxazole-Trimethoprim Rash    Chest tightness. Bactrim     Past Medical History:  Diagnosis Date  . Anxiety   . Arthritis   . Breast cancer ([place]) 05/12/2017   INVASIVE MAMMARY CARCINOMA ER/PR positive LEFT BREAST UPPER inner  QUAD  . GERD (gastroesophageal reflux disease)    OCC  . Hypothyroidism   . Sjogren's syndrome [facility])      Past Surgical History:  Procedure Laterality Date  . BREAST BIOPSY Left 05/12/2017   [place] bx/ INVASIVE MAMMARY CARCINOMA  . BREAST LUMPECTOMY WITH SENTINEL LYMPH NODE BIOPSY Left 06/10/2017   Procedure: BREAST LUMPECTOMY WITH SENTINEL LYMPH NODE BX AND NEEDLE LOCALIZATION;  Surgeon: Robert Bellow, MD;  Location: [facility] ORS;  Service: General;  Laterality: Left;  . BREAST MAMMOSITE Left 06/24/2017   Procedure: MAMMOSITE BREAST;  Surgeon: Robert Bellow, MD;  Location: [facility] ORS;  Service: General;  Laterality: Left;  . CESAREAN SECTION  1995  . COSMETIC SURGERY    . CYST EXCISION  2018   pilar cyst/ Dr Will Bonnet  . PORTACATH PLACEMENT Right 07/08/2017   Procedure: INSERTION PORT-A-CATH;  Surgeon: Robert Bellow, MD;  Location: [facility] ORS;  Service: General;  Laterality: Right;  . TONSILLECTOMY      Social History   Socioeconomic History  . Marital status: Married    Spouse name: Not on file  . Number of children: Not on file  . Years of education: Not on file  . Highest education level: Not on file  Social Needs  . Financial resource strain: Not on file  . Food insecurity - worry: Never true  . Food insecurity - inability: Never true  . Transportation needs - medical: No  . Transportation needs - non-medical: No  Occupational History  . Not on file  Tobacco Use  . Smoking status: Former Smoker    Years: 7.00    Types: Cigarettes    Last attempt to quit: 08/13/1979    Years since quitting: 37.9  . Smokeless tobacco: Never Used  .  Tobacco comment: AGE [age]  Substance and Sexual Activity  . Alcohol use: No  . Drug use: No  . Sexual activity: Yes  Other Topics Concern  . Not on file  Social History Narrative  . Not on file    Family History  Problem Relation Age of Onset  . Breast cancer Maternal Aunt 62       currently ~65  . Diabetes Mother   . Skin cancer Mother        currently 12; TAH/BSO (age?)  . Anemia Mother   . Liver disease Father        deceased / not much info about him / alcoholic  . Lung cancer Paternal Aunt        smoker / deceased 71s  . Stomach cancer Paternal Uncle 98       deceased / deceased 90s  . Ovarian cancer Maternal Grandmother 50       deceased 75s  . Stomach cancer Paternal Grandfather 47       deceased 40s  . Cancer Maternal Uncle        unk. type; deceased 58s  . Leukemia Cousin 50       deceased 34  Current Outpatient Medications:  .  acetaminophen (TYLENOL) 500 MG tablet, Take 1,000 mg by mouth every 8 (eight) hours as needed for mild pain or headache., Disp: , Rfl:  .  Boric Acid POWD, Place 1 capsule daily as needed vaginally (for pH levels). , Disp: , Rfl:  .  Ca Carbonate-Mag Hydroxide (ROLAIDS PO), Take 1 tablet as needed by mouth (indigestion). , Disp: , Rfl:  .  Carboxymethylcell-Hypromellose (GENTEAL OP), Apply 1 drop to eye 2 (two) times daily., Disp: , Rfl:  .  chlorhexidine (PERIDEX) 0.12 % solution, Use as directed 15 mLs in the mouth or throat 2 (two) times daily., Disp: 473 mL, Rfl: 0 .  clotrimazole-betamethasone (LOTRISONE) cream, Apply 1 application 2 (two) times a week topically., Disp: , Rfl:  .  dexamethasone (DECADRON) 4 MG tablet, Take 2 tablets (8 mg total) by mouth 2 (two) times daily. Start the day before Taxotere. Then again the day after chemo for 3 days., Disp: 30 tablet, Rfl: 1 .  HYDROcodone-acetaminophen (NORCO) 5-325 MG tablet, Take 1-2 tablets by mouth every 4 (four) hours as needed for moderate pain. Take no more than 10 tabs/ day.  (Patient taking differently: Take 1 tablet every 4 (four) hours as needed by mouth for moderate pain. Take no more than 10 tabs/ day.), Disp: 30 tablet, Rfl: 0 .  hydrocortisone 1 % ointment, Apply 1 application topically 2 (two) times daily., Disp: 28 g, Rfl: 0 .  ketoconazole (NIZORAL) 2 % shampoo, Apply 1 application topically 2 (two) times a week., Disp: , Rfl:  .  lidocaine-prilocaine (EMLA) cream, Apply to affected area once, Disp: 30 g, Rfl: 3 .  loperamide (IMODIUM) 2 MG capsule, Take 1 capsule (2 mg total) by mouth See admin instructions. With onset of loose stool, take 43m followed by 234mevery 2 hours until 12 hours have passed without loose bowel movement. Maximum: 16 mg/day, Disp: 120 capsule, Rfl: 1 .  meloxicam (MOBIC) 15 MG tablet, Take 15 mg by mouth at bedtime. , Disp: , Rfl:  .  mupirocin ointment (BACTROBAN) 2 %, Place 1 application into the nose 2 (two) times daily., Disp: 22 g, Rfl: 0 .  omeprazole (PRILOSEC) 20 MG capsule, Take 1 capsule (20 mg total) by mouth daily., Disp: 30 capsule, Rfl: 1 .  ondansetron (ZOFRAN) 8 MG tablet, Take 1 tablet (8 mg total) by mouth 2 (two) times daily as needed for refractory nausea / vomiting. Start on day 3 after chemo., Disp: 30 tablet, Rfl: 1 .  PARoxetine (PAXIL) 20 MG tablet, Take 20 mg by mouth at bedtime. , Disp: , Rfl:  .  prednisoLONE sodium phosphate (INFLAMASE FORTE) 1 % ophthalmic solution, Place 1 drop 2 (two) times daily as needed into the left eye (eye infection). , Disp: , Rfl:  .  prochlorperazine (COMPAZINE) 10 MG tablet, Take 1 tablet (10 mg total) by mouth every 6 (six) hours as needed (Nausea or vomiting)., Disp: 30 tablet, Rfl: 1 .  zolpidem (AMBIEN) 5 MG tablet, Take 1 tablet (5 mg total) by mouth at bedtime as needed for sleep., Disp: 30 tablet, Rfl: 1  Physical exam: There were no vitals filed for this visit. Physical Exam  Constitutional: She is oriented to person, place, and time and well-developed, well-nourished,  and in no distress. Vital signs are normal.  HENT:  Head: Normocephalic and atraumatic.  Eyes: Pupils are equal, round, and reactive to light.  Neck: Normal range of motion. Neck supple.  Cardiovascular: Normal rate, regular  rhythm, normal heart sounds and intact distal pulses.  Pulmonary/Chest: Effort normal and breath sounds normal.    Abdominal: Soft. Normal appearance and bowel sounds are normal.  Musculoskeletal: Normal range of motion.  Neurological: She is alert and oriented to person, place, and time. Gait normal.  Skin: Skin is warm and dry. Rash noted. There is erythema.        CMP Latest Ref Rng & Units 07/16/2017  Glucose 65 - 99 mg/dL 107(H)  BUN 6 - 20 mg/dL 20  Creatinine 0.44 - 1.00 mg/dL 0.90  Sodium 135 - 145 mmol/L 134(L)  Potassium 3.5 - 5.1 mmol/L 3.9  Chloride 101 - 111 mmol/L 102  CO2 22 - 32 mmol/L 25  Calcium 8.9 - 10.3 mg/dL 8.6(L)  Total Protein 6.5 - 8.1 g/dL 6.0(L)  Total Bilirubin 0.3 - 1.2 mg/dL 0.6  Alkaline Phos 38 - 126 U/L 110  AST 15 - 41 U/L 31  ALT 14 - 54 U/L 35   CBC Latest Ref Rng & Units 07/16/2017  WBC 3.6 - 11.0 K/uL 15.5(H)  Hemoglobin 12.0 - 16.0 g/dL 13.0  Hematocrit 35.0 - 47.0 % 38.3  Platelets 150 - 440 K/uL 161    No images are attached to the encounter.  Dg Chest Port 1 View  Result Date: 07/08/2017 CLINICAL DATA:  56 year old female status post Port-A-Cath placement. Left breast carcinoma. EXAM: PORTABLE CHEST 1 VIEW COMPARISON:  No prior chest radiograph. FINDINGS: Portable AP upright view at 0834 hours. Right chest subclavian approach porta cath in place an currently accessed. Catheter tip projects at the carina, lower SVC level. Lung volumes are within normal limits. No pneumothorax. Mild eventration of the right hemidiaphragm, normal variant. Allowing for portable technique the lungs are clear. Mediastinal contours within normal limits. No acute osseous abnormality identified. IMPRESSION: 1. Right chest subclavian  approach porta cath placed with no adverse features. Catheter tip at the SVC level. 2.  No acute cardiopulmonary abnormality. Electronically Signed   By: Genevie Ann M.D.   On: 07/08/2017 08:48   Dg C-arm 1-60 Min-no Report  Result Date: 07/08/2017 Fluoroscopy was utilized by the requesting physician.  No radiographic interpretation.     Assessment and plan- Patient is a 56 y.o. female he presents for small incision site directly above port. Erythema and small amounts of purulent drainage noted from 1 cm lateral slit directly above PORT. Incision appears to be from PORT placement. Previously it had a Steri-Strip covering.   1. Erythema and purulent drainage to 1 cm lateral incision directly above port site: Rx Bactroban ointment 2%. Apply BID. Patient instructed to keep incision dry and clean. Dr. Tasia Catchings notified. Patient will be reevaluated by Dr. Tasia Catchings next week.  2. Insomnia: Refilled Ambien 5 mg. This appears to be helping. 3. RTC as scheduled to see Dr. Tasia Catchings.     Visit Diagnosis 1. Malignant neoplasm of upper-inner quadrant of left breast in female, estrogen receptor positive ([place])   2. Insomnia, unspecified type     Patient expressed understanding and was in agreement with this plan. She also understands that She can call clinic at any time with any questions, concerns, or complaints.   Greater than 50% was spent in counseling and coordination of care with this patient including but not limited to discussion of the relevant topics above (See A&P) including, but not limited to diagnosis and management of acute and chronic medical conditions.    Faythe Casa, AGNP-C [facility] [place]- [phone]  Pager- [phone] 07/28/2017 10:52 AM

## 2017-07-24 NOTE — Telephone Encounter (Signed)
Patient called to report that the area above her port was irritated and red. New onset. Advised patient that a NP could assess her port today. She agreed to come in this afternoon.

## 2017-07-29 NOTE — Progress Notes (Signed)
Hematology/Oncology Follow up note [facility] [phone] [phone]   Patient Care Team: Herminio Commons, MD as PCP - General (Family Medicine) Rico Junker, RN as Registered Nurse Theodore Demark, RN as Registered Nurse  REFERRING PROVIDER: Herminio Commons, MD REASON FOR VISIT Follow up for treatment of  Breast cancer  HISTORY OF PRESENTING ILLNESS:  Sheena Martinez is a  56 y.o.  female with PMH listed below who was referred to me for evaluation of newly diagnosed breast cancer. Patient reports that she is been doing fine and she underwent screening annual mammogram which discovered abdominal findings.09/19/2018patient underwent screening, bilateral mammogram warrant further evaluation.on 05/12/2017, patient underwent diagnostic mammogram which discoveredleft breast irregular mass was spiculated margins in the upper slightly inner left breast. Ultrasound of the superior left breast showed hypoechoic mass with spiculated margins at 11:00, 4 cm from the nipple measuring 574 mm. Patient underwent biopsy of the left breast mass, pathology showed invasive mammary carcinoma. ER/PR >+90%, HER-2 IHC +2 equivocal, fish negative. Patient has been evaluated by Dr. Bary Castilla this week. Patient presented to the clinic today, accomplished by her daughter-in-law to establish care. Patient overall reports feeling well. She has a positive family history of breast cancer in her maternal aunt. 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 She does not have insurance pending applying Medicaid. She has a history of Sjogren disease for which she has had tear duct surgery. She also reports history of PCOS with elevated testosterone level. She is perimenopausal, her last menstrual period reported as 3 months ago, last for about 3 months. Denies any hormonal replacement therapy.   Pathology revealed a 6 mm ER PR positive,  HER-2 negative left invasive mammary carcinoma, margin is negative, no LV I. Pathological stage and pT1b N0. Mammoprintr was sent during intervaland came back with 10 year recurrence rate at 29% high risk group. Patient has completed MammoSite RT   INTERVAL HISTORY Patient  Presented for evaluation for adjuvant chemtherapy TC. She reports mild finger tip sensitivities.  Denies any nausea or vomiting. She feels "hyper" and wants to have norco refilled which can " take her off the edge".  She also asks if ok to have [facility] done as her mother is visiting her and plan to have nail done together.  She applied antibiotic cream on her small skin wound above medi port and has healed well.   Review of Systems  Constitutional: Negative for chills, fever, malaise/fatigue and weight loss.  HENT: Negative for ear pain and hearing loss.   Eyes: Negative for blurred vision, double vision and pain.  Respiratory: Negative for cough, sputum production and shortness of breath.   Cardiovascular: Negative for chest pain, palpitations, orthopnea and claudication.  Gastrointestinal: Negative for blood in stool, constipation, diarrhea and nausea.  Genitourinary: Negative for dysuria, frequency and urgency.  Musculoskeletal: Negative for back pain, myalgias and neck pain.  Skin: Negative for rash.  Neurological: Negative for dizziness, tingling and tremors.       Finger tip sensitivity  Endo/Heme/Allergies: Negative for environmental allergies. Does not bruise/bleed easily.  Psychiatric/Behavioral: Negative for depression.   MEDICAL HISTORY:  Past Medical History:  Diagnosis Date  . Anxiety   . Arthritis   . Breast cancer ([place]) 05/12/2017   INVASIVE MAMMARY CARCINOMA ER/PR positive LEFT BREAST UPPER inner  QUAD  . GERD (gastroesophageal reflux disease)    OCC  .  Hypothyroidism   . Sjogren's syndrome ([place])     SURGICAL HISTORY: Past Surgical History:  Procedure Laterality Date  . BREAST  BIOPSY Left 05/12/2017   [place] bx/ INVASIVE MAMMARY CARCINOMA  . BREAST LUMPECTOMY WITH SENTINEL LYMPH NODE BIOPSY Left 06/10/2017   Procedure: BREAST LUMPECTOMY WITH SENTINEL LYMPH NODE BX AND NEEDLE LOCALIZATION;  Surgeon: Robert Bellow, MD;  Location: [facility] ORS;  Service: General;  Laterality: Left;  . BREAST MAMMOSITE Left 06/24/2017   Procedure: MAMMOSITE BREAST;  Surgeon: Robert Bellow, MD;  Location: [facility] ORS;  Service: General;  Laterality: Left;  . CESAREAN SECTION  1995  . COSMETIC SURGERY    . CYST EXCISION  2018   pilar cyst/ Dr Will Bonnet  . PORTACATH PLACEMENT Right 07/08/2017   Procedure: INSERTION PORT-A-CATH;  Surgeon: Robert Bellow, MD;  Location: [facility] ORS;  Service: General;  Laterality: Right;  . TONSILLECTOMY      SOCIAL HISTORY: Social History   Socioeconomic History  . Marital status: Married    Spouse name: Not on file  . Number of children: Not on file  . Years of education: Not on file  . Highest education level: Not on file  Social Needs  . Financial resource strain: Not on file  . Food insecurity - worry: Never true  . Food insecurity - inability: Never true  . Transportation needs - medical: No  . Transportation needs - non-medical: No  Occupational History  . Not on file  Tobacco Use  . Smoking status: Former Smoker    Years: 7.00    Types: Cigarettes    Last attempt to quit: 08/13/1979    Years since quitting: 37.9  . Smokeless tobacco: Never Used  . Tobacco comment: AGE [age]  Substance and Sexual Activity  . Alcohol use: No  . Drug use: No  . Sexual activity: Yes  Other Topics Concern  . Not on file  Social History Narrative  . Not on file    FAMILY HISTORY: Family History  Problem Relation Age of Onset  . Breast cancer Maternal Aunt 75       currently ~65  . Diabetes Mother   . Skin cancer Mother        currently 13; TAH/BSO (age?)  . Anemia Mother   . Liver disease Father        deceased / not much info about him /  alcoholic  . Lung cancer Paternal Aunt        smoker / deceased 75s  . Stomach cancer Paternal Uncle 17       deceased / deceased 36s  . Ovarian cancer Maternal Grandmother 4       deceased 64s  . Stomach cancer Paternal Grandfather 34       deceased 41s  . Cancer Maternal Uncle        unk. type; deceased 44s  . Leukemia Cousin 49       deceased 19    ALLERGIES:  is allergic to sulfamethoxazole-trimethoprim.  MEDICATIONS:  Current Outpatient Medications  Medication Sig Dispense Refill  . acetaminophen (TYLENOL) 500 MG tablet Take 1,000 mg by mouth every 8 (eight) hours as needed for mild pain or headache.    . Boric Acid POWD Place 1 capsule daily as needed vaginally (for pH levels).     . Ca Carbonate-Mag Hydroxide (ROLAIDS PO) Take 1 tablet as needed by mouth (indigestion).     . Carboxymethylcell-Hypromellose (GENTEAL OP) Apply 1 drop to eye 2 (two)  times daily.    . chlorhexidine (PERIDEX) 0.12 % solution Use as directed 15 mLs in the mouth or throat 2 (two) times daily. 473 mL 0  . clotrimazole-betamethasone (LOTRISONE) cream Apply 1 application 2 (two) times a week topically.    Marland Kitchen dexamethasone (DECADRON) 4 MG tablet Take 2 tablets (8 mg total) by mouth 2 (two) times daily. Start the day before Taxotere. Then again the day after chemo for 3 days. 30 tablet 1  . HYDROcodone-acetaminophen (NORCO) 5-325 MG tablet Take 1-2 tablets by mouth every 4 (four) hours as needed for moderate pain. Take no more than 10 tabs/ day. (Patient taking differently: Take 1 tablet every 4 (four) hours as needed by mouth for moderate pain. Take no more than 10 tabs/ day.) 30 tablet 0  . hydrocortisone 1 % ointment Apply 1 application topically 2 (two) times daily. 28 g 0  . ketoconazole (NIZORAL) 2 % shampoo Apply 1 application topically 2 (two) times a week.    . lidocaine-prilocaine (EMLA) cream Apply to affected area once 30 g 3  . loperamide (IMODIUM) 2 MG capsule Take 1 capsule (2 mg total) by  mouth See admin instructions. With onset of loose stool, take '4mg'$  followed by '2mg'$  every 2 hours until 12 hours have passed without loose bowel movement. Maximum: 16 mg/day 120 capsule 1  . meloxicam (MOBIC) 15 MG tablet Take 15 mg by mouth at bedtime.     . mupirocin ointment (BACTROBAN) 2 % Place 1 application into the nose 2 (two) times daily. 22 g 0  . omeprazole (PRILOSEC) 20 MG capsule Take 1 capsule (20 mg total) by mouth daily. 30 capsule 1  . ondansetron (ZOFRAN) 8 MG tablet Take 1 tablet (8 mg total) by mouth 2 (two) times daily as needed for refractory nausea / vomiting. Start on day 3 after chemo. 30 tablet 1  . PARoxetine (PAXIL) 20 MG tablet Take 20 mg by mouth at bedtime.     . prednisoLONE sodium phosphate (INFLAMASE FORTE) 1 % ophthalmic solution Place 1 drop 2 (two) times daily as needed into the left eye (eye infection).     . prochlorperazine (COMPAZINE) 10 MG tablet Take 1 tablet (10 mg total) by mouth every 6 (six) hours as needed (Nausea or vomiting). 30 tablet 1  . zolpidem (AMBIEN) 5 MG tablet Take 1 tablet (5 mg total) by mouth at bedtime as needed for sleep. 30 tablet 1   No current facility-administered medications for this visit.       Marland Kitchen  PHYSICAL EXAMINATION: ECOG PERFORMANCE STATUS: 0 - Asymptomatic Vitals:   07/30/17 0926  BP: 136/81  Pulse: (!) 103  Resp: 18  Temp: 98.1 F (36.7 C)   Physical Exam  Constitutional: She is oriented to person, place, and time and well-developed, well-nourished, and in no distress. No distress.  HENT:  Head: Normocephalic and atraumatic.  Eyes: Conjunctivae and EOM are normal. Pupils are equal, round, and reactive to light. No scleral icterus.  Neck: Normal range of motion. Neck supple.  Cardiovascular: Normal rate, regular rhythm and normal heart sounds. Exam reveals no friction rub.  Pulmonary/Chest: Breath sounds normal. She is in respiratory distress.  Abdominal: Soft. Bowel sounds are normal. She exhibits no  distension.  Musculoskeletal: Normal range of motion. She exhibits no edema.  Lymphadenopathy:    She has no cervical adenopathy.  Neurological: She is alert and oriented to person, place, and time.  Skin: Skin is warm and dry.  Psychiatric: Affect normal.  Breast exam incision well-healed. Can not palpate any lymphadenopathy or mass.  LABORATORY DATA:  I have reviewed the data as listed: no recent labs.  Lab Results  Component Value Date   WBC 15.5 (H) 07/16/2017   HGB 13.0 07/16/2017   HCT 38.3 07/16/2017   MCV 91.0 07/16/2017   PLT 161 07/16/2017   Recent Labs    06/30/17 1433 07/09/17 0858 07/16/17 1435  NA 136 137 134*  K 4.3 4.0 3.9  CL 103 103 102  CO2 '27 28 25  '$ GLUCOSE 93 140* 107*  BUN 21* 21* 20  CREATININE 0.70 0.74 0.90  CALCIUM 9.2 8.8* 8.6*  GFRNONAA >60 >60 >60  GFRAA >60 >60 >60  PROT 7.0 6.6 6.0*  ALBUMIN 3.5 3.5 3.2*  AST '18 21 31  '$ ALT 17 17 35  ALKPHOS 97 92 110  BILITOT 0.4 0.5 0.6     Genetic testing result vis INV[facility] showed no pathogenic sequence appearance or deletions /duplications.   ASSESSMENT & PLAN:  56 yo premenopausal female with Stage 1A breast cancer, high risk mammaprint index currently on adjuvant chemotherapy with TC. Cancer Staging Malignant neoplasm of upper-inner quadrant of left breast in female, estrogen receptor positive ([place]) Staging form: Breast, AJCC 8th Edition - Clinical stage from 05/22/2017: Stage IA (cT1, cN0, cM0, G1, ER: Positive, PR: Positive, HER2: Negative) - Signed by Earlie Server, MD on 05/22/2017 - Pathologic stage from 07/09/2017: Stage IA (pT1b, pN0, cM0, G2, ER: Positive, PR: Positive, HER2: Negative) - Signed by Earlie Server, MD on 07/30/2017  1. Encounter for antineoplastic chemotherapy   2. Malignant neoplasm of upper-inner quadrant of left breast in female, estrogen receptor positive ([place])   3. Anxiety   4. Thrombocytosis ([place])     #ok to proceed with cycle 2 Taxotere and Cytoxan + onpro.  # Small  wound above her medi port has healed well.  # Dental clearance received. She needs to have tooth extracted and another tooth restoration work done.  Will wait until these dental work done and give zometa at next visit.   #Anxiety: she takes Paxil. She asks for refill or norco to take her off edge. I explained to patient that narcotics are not used for treatment of anxiety. I suggest her to take only 2 days steroid after chemotherapy instead of 3 days.  Also will give her Rx for low dose Xanax if she feels too anxious and need to calm down.  Not to be used in combination with Ambien QHS PRN. Patient understands and agree with the plan.   # Thrombocytosis is likely reactive. Monitor.  Return of visit: 3 weeks Lab/MD/ TC /?zometa.  Earlie Server, MD, PhD Hematology Oncology [facility] Pager- [phone] 07/30/2017

## 2017-07-30 ENCOUNTER — Inpatient Hospital Stay: Payer: Medicaid Other

## 2017-07-30 ENCOUNTER — Inpatient Hospital Stay (HOSPITAL_BASED_OUTPATIENT_CLINIC_OR_DEPARTMENT_OTHER): Payer: Medicaid Other | Admitting: Oncology

## 2017-07-30 ENCOUNTER — Ambulatory Visit: Payer: Medicaid Other

## 2017-07-30 ENCOUNTER — Encounter: Payer: Self-pay | Admitting: Oncology

## 2017-07-30 ENCOUNTER — Other Ambulatory Visit: Payer: Medicaid Other

## 2017-07-30 ENCOUNTER — Ambulatory Visit: Payer: Medicaid Other | Admitting: Oncology

## 2017-07-30 VITALS — BP 136/81 | HR 103 | Temp 98.1°F | Resp 18 | Wt 222.0 lb

## 2017-07-30 DIAGNOSIS — M129 Arthropathy, unspecified: Secondary | ICD-10-CM | POA: Diagnosis not present

## 2017-07-30 DIAGNOSIS — R197 Diarrhea, unspecified: Secondary | ICD-10-CM | POA: Diagnosis not present

## 2017-07-30 DIAGNOSIS — E039 Hypothyroidism, unspecified: Secondary | ICD-10-CM

## 2017-07-30 DIAGNOSIS — C50212 Malignant neoplasm of upper-inner quadrant of left female breast: Secondary | ICD-10-CM

## 2017-07-30 DIAGNOSIS — Z806 Family history of leukemia: Secondary | ICD-10-CM

## 2017-07-30 DIAGNOSIS — Z803 Family history of malignant neoplasm of breast: Secondary | ICD-10-CM

## 2017-07-30 DIAGNOSIS — K219 Gastro-esophageal reflux disease without esophagitis: Secondary | ICD-10-CM

## 2017-07-30 DIAGNOSIS — D473 Essential (hemorrhagic) thrombocythemia: Secondary | ICD-10-CM

## 2017-07-30 DIAGNOSIS — Z7689 Persons encountering health services in other specified circumstances: Secondary | ICD-10-CM

## 2017-07-30 DIAGNOSIS — Z170 Estrogen receptor positive status [ER+]: Secondary | ICD-10-CM | POA: Diagnosis not present

## 2017-07-30 DIAGNOSIS — R110 Nausea: Secondary | ICD-10-CM | POA: Diagnosis not present

## 2017-07-30 DIAGNOSIS — F419 Anxiety disorder, unspecified: Secondary | ICD-10-CM | POA: Diagnosis not present

## 2017-07-30 DIAGNOSIS — E282 Polycystic ovarian syndrome: Secondary | ICD-10-CM | POA: Diagnosis not present

## 2017-07-30 DIAGNOSIS — D75839 Thrombocytosis, unspecified: Secondary | ICD-10-CM

## 2017-07-30 DIAGNOSIS — Z8041 Family history of malignant neoplasm of ovary: Secondary | ICD-10-CM

## 2017-07-30 DIAGNOSIS — Z800 Family history of malignant neoplasm of digestive organs: Secondary | ICD-10-CM

## 2017-07-30 DIAGNOSIS — L538 Other specified erythematous conditions: Secondary | ICD-10-CM

## 2017-07-30 DIAGNOSIS — Z5111 Encounter for antineoplastic chemotherapy: Secondary | ICD-10-CM | POA: Diagnosis not present

## 2017-07-30 DIAGNOSIS — M3500 Sicca syndrome, unspecified: Secondary | ICD-10-CM

## 2017-07-30 DIAGNOSIS — Z801 Family history of malignant neoplasm of trachea, bronchus and lung: Secondary | ICD-10-CM

## 2017-07-30 DIAGNOSIS — Z87891 Personal history of nicotine dependence: Secondary | ICD-10-CM

## 2017-07-30 LAB — COMPREHENSIVE METABOLIC PANEL
ALK PHOS: 93 U/L (ref 38–126)
ALT: 24 U/L (ref 14–54)
AST: 27 U/L (ref 15–41)
Albumin: 3.4 g/dL — ABNORMAL LOW (ref 3.5–5.0)
Anion gap: 8 (ref 5–15)
BILIRUBIN TOTAL: 0.4 mg/dL (ref 0.3–1.2)
BUN: 20 mg/dL (ref 6–20)
CO2: 23 mmol/L (ref 22–32)
CREATININE: 0.59 mg/dL (ref 0.44–1.00)
Calcium: 9.1 mg/dL (ref 8.9–10.3)
Chloride: 106 mmol/L (ref 101–111)
GFR calc Af Amer: >60 mL/min (ref >60)
GLUCOSE: 137 mg/dL — AB (ref 65–99)
Potassium: 3.8 mmol/L (ref 3.5–5.1)
Sodium: 137 mmol/L (ref 135–145)
TOTAL PROTEIN: 7.2 g/dL (ref 6.5–8.1)

## 2017-07-30 LAB — CBC WITH DIFFERENTIAL/PLATELET
BASOS ABS: 0 10*3/uL (ref 0–0.1)
Basophils Relative: 0 %
EOS ABS: 0 10*3/uL (ref 0–0.7)
EOS PCT: 0 %
HCT: 35.2 % (ref 35.0–47.0)
Hemoglobin: 12 g/dL (ref 12.0–16.0)
Lymphocytes Relative: 12 %
Lymphs Abs: 0.9 10*3/uL — ABNORMAL LOW (ref 1.0–3.6)
MCH: 31.3 pg (ref 26.0–34.0)
MCHC: 34.1 g/dL (ref 32.0–36.0)
MCV: 91.8 fL (ref 80.0–100.0)
MONO ABS: 0.2 10*3/uL (ref 0.2–0.9)
Monocytes Relative: 2 %
Neutro Abs: 6.1 10*3/uL (ref 1.4–6.5)
Neutrophils Relative %: 86 %
PLATELETS: 586 10*3/uL — AB (ref 150–440)
RBC: 3.84 MIL/uL (ref 3.80–5.20)
RDW: 14.7 % — AB (ref 11.5–14.5)
WBC: 7.1 10*3/uL (ref 3.6–11.0)

## 2017-07-30 MED ORDER — SODIUM CHLORIDE 0.9 % IV SOLN: 10.0000 mg | Freq: Once | INTRAVENOUS | Status: DC

## 2017-07-30 MED ORDER — ALPRAZOLAM 0.25 MG PO TABS: 0.2500 mg | ORAL_TABLET | Freq: Two times a day (BID) | ORAL | 0 refills | Status: DC | PRN

## 2017-07-30 MED ORDER — DEXAMETHASONE SODIUM PHOSPHATE 10 MG/ML IJ SOLN
10.0000 mg | Freq: Once | INTRAMUSCULAR | Status: AC
  Administered 2017-07-30: 10 mg via INTRAVENOUS
  Filled 2017-07-30: qty 1

## 2017-07-30 MED ORDER — SODIUM CHLORIDE 0.9 % IV SOLN
600.0000 mg/m2 | Freq: Once | INTRAVENOUS | Status: AC
  Administered 2017-07-30: 1260 mg via INTRAVENOUS
  Filled 2017-07-30: qty 50

## 2017-07-30 MED ORDER — PEGFILGRASTIM 6 MG/0.6ML ~~LOC~~ PSKT
6.0000 mg | PREFILLED_SYRINGE | Freq: Once | SUBCUTANEOUS | Status: AC
  Administered 2017-07-30: 6 mg via SUBCUTANEOUS
  Filled 2017-07-30: qty 0.6

## 2017-07-30 MED ORDER — SODIUM CHLORIDE 0.9% FLUSH
10.0000 mL | INTRAVENOUS | Status: DC | PRN
  Administered 2017-07-30: 10 mL via INTRAVENOUS
  Filled 2017-07-30: qty 10

## 2017-07-30 MED ORDER — SODIUM CHLORIDE 0.9 % IV SOLN
75.0000 mg/m2 | Freq: Once | INTRAVENOUS | Status: AC
  Administered 2017-07-30: 160 mg via INTRAVENOUS
  Filled 2017-07-30: qty 16

## 2017-07-30 MED ORDER — SODIUM CHLORIDE 0.9 % IV SOLN
Freq: Once | INTRAVENOUS | Status: AC
  Administered 2017-07-30: 10:00:00 via INTRAVENOUS
  Filled 2017-07-30: qty 1000

## 2017-07-30 MED ORDER — HYDROCODONE-ACETAMINOPHEN 5-325 MG PO TABS: 1.0000 | ORAL_TABLET | Freq: Four times a day (QID) | ORAL | 0 refills | Status: DC | PRN

## 2017-07-30 MED ORDER — HEPARIN SOD (PORK) LOCK FLUSH 100 UNIT/ML IV SOLN
500.0000 [IU] | Freq: Once | INTRAVENOUS | Status: AC
  Administered 2017-07-30: 500 [IU] via INTRAVENOUS
  Filled 2017-07-30: qty 5

## 2017-07-30 MED ORDER — PALONOSETRON HCL INJECTION 0.25 MG/5ML
0.2500 mg | Freq: Once | INTRAVENOUS | Status: AC
  Administered 2017-07-30: 0.25 mg via INTRAVENOUS
  Filled 2017-07-30: qty 5

## 2017-07-30 NOTE — Progress Notes (Signed)
Per Dr. Ernestene Kiel zometa for today.

## 2017-07-30 NOTE — Progress Notes (Signed)
Patient here for follow up with labs and treatment today. She states that she is feeling well and denies having any pain. She states that she has some sensitivity in her fingers, which is new.

## 2017-08-11 ENCOUNTER — Ambulatory Visit (INDEPENDENT_AMBULATORY_CARE_PROVIDER_SITE_OTHER): Payer: Medicaid Other | Admitting: General Surgery

## 2017-08-11 VITALS — BP 136/78 | HR 89 | Resp 14 | Ht 60.0 in | Wt 224.0 lb

## 2017-08-11 DIAGNOSIS — C50212 Malignant neoplasm of upper-inner quadrant of left female breast: Secondary | ICD-10-CM

## 2017-08-11 DIAGNOSIS — Z170 Estrogen receptor positive status [ER+]: Secondary | ICD-10-CM

## 2017-08-11 NOTE — Patient Instructions (Addendum)
Keep skin lesion padded while wearing bra. Call us if no improvement in area by mid January.  Follow up in May.

## 2017-08-11 NOTE — Progress Notes (Signed)
Patient ID: Sheena Martinez, female   DOB: [birth year], 56 y.o.   MRN: [id number]  Chief Complaint  Patient presents with  . Follow-up    HPI Sheena Martinez is a 56 y.o. female here today for her follow up Mammosite placement done on 06/24/17. She reports that she is doing well. She has completed radiation.  She reports that she has an irritated area on her right shoulder that is being treated by Dr Tasia Catchings with Hydrocortisone 1%.  HPI  Past Medical History:  Diagnosis Date  . Anxiety   . Arthritis   . Breast cancer ([place]) 05/12/2017   INVASIVE MAMMARY CARCINOMA ER/PR positive LEFT BREAST UPPER inner  QUAD  . GERD (gastroesophageal reflux disease)    OCC  . Hypothyroidism   . Sjogren's syndrome [facility])     Past Surgical History:  Procedure Laterality Date  . BREAST BIOPSY Left 05/12/2017   [place] bx/ INVASIVE MAMMARY CARCINOMA  . BREAST LUMPECTOMY WITH SENTINEL LYMPH NODE BIOPSY Left 06/10/2017   Procedure: BREAST LUMPECTOMY WITH SENTINEL LYMPH NODE BX AND NEEDLE LOCALIZATION;  Surgeon: Robert Bellow, MD;  Location: [facility] ORS;  Service: General;  Laterality: Left;  . BREAST MAMMOSITE Left 06/24/2017   Procedure: MAMMOSITE BREAST;  Surgeon: Robert Bellow, MD;  Location: [facility] ORS;  Service: General;  Laterality: Left;  . CESAREAN SECTION  1995  . COSMETIC SURGERY    . CYST EXCISION  2018   pilar cyst/ Dr Will Bonnet  . PORTACATH PLACEMENT Right 07/08/2017   Procedure: INSERTION PORT-A-CATH;  Surgeon: Robert Bellow, MD;  Location: [facility] ORS;  Service: General;  Laterality: Right;  . TONSILLECTOMY      Family History  Problem Relation Age of Onset  . Breast cancer Maternal Aunt 24       currently ~65  . Diabetes Mother   . Skin cancer Mother        currently 18; TAH/BSO (age?)  . Anemia Mother   . Liver disease Father        deceased / not much info about him / alcoholic  . Lung cancer Paternal Aunt        smoker / deceased 68s  . Stomach cancer Paternal Uncle 49   deceased / deceased 38s  . Ovarian cancer Maternal Grandmother 30       deceased 10s  . Stomach cancer Paternal Grandfather 70       deceased 14s  . Cancer Maternal Uncle        unk. type; deceased 26s  . Leukemia Cousin 54       deceased 39    Social History Social History   Tobacco Use  . Smoking status: Former Smoker    Years: 7.00    Types: Cigarettes    Last attempt to quit: 08/13/1979    Years since quitting: 38.0  . Smokeless tobacco: Never Used  . Tobacco comment: AGE [age]  Substance Use Topics  . Alcohol use: No  . Drug use: No    Allergies  Allergen Reactions  . Sulfamethoxazole-Trimethoprim Rash    Chest tightness. Bactrim    Current Outpatient Medications  Medication Sig Dispense Refill  . acetaminophen (TYLENOL) 500 MG tablet Take 1,000 mg by mouth every 8 (eight) hours as needed for mild pain or headache.    . ALPRAZolam (XANAX) 0.25 MG tablet Take 1 tablet (0.25 mg total) by mouth 2 (two) times daily as needed for anxiety. 10 tablet 0  . Boric Acid POWD Place  1 capsule daily as needed vaginally (for pH levels).     . Ca Carbonate-Mag Hydroxide (ROLAIDS PO) Take 1 tablet as needed by mouth (indigestion).     . Carboxymethylcell-Hypromellose (GENTEAL OP) Apply 1 drop to eye 2 (two) times daily.    . chlorhexidine (PERIDEX) 0.12 % solution Use as directed 15 mLs in the mouth or throat 2 (two) times daily. 473 mL 0  . clotrimazole-betamethasone (LOTRISONE) cream Apply 1 application 2 (two) times a week topically.    Marland Kitchen dexamethasone (DECADRON) 4 MG tablet Take 2 tablets (8 mg total) by mouth 2 (two) times daily. Start the day before Taxotere. Then again the day after chemo for 3 days. 30 tablet 1  . hydrocortisone 1 % ointment Apply 1 application topically 2 (two) times daily. 28 g 0  . ketoconazole (NIZORAL) 2 % shampoo Apply 1 application topically 2 (two) times a week.    . lidocaine-prilocaine (EMLA) cream Apply to affected area once 30 g 3  . loperamide  (IMODIUM) 2 MG capsule Take 1 capsule (2 mg total) by mouth See admin instructions. With onset of loose stool, take 4mg  followed by 2mg  every 2 hours until 12 hours have passed without loose bowel movement. Maximum: 16 mg/day 120 capsule 1  . meloxicam (MOBIC) 15 MG tablet Take 15 mg by mouth at bedtime.     . mupirocin ointment (BACTROBAN) 2 % Place 1 application into the nose 2 (two) times daily. 22 g 0  . omeprazole (PRILOSEC) 20 MG capsule Take 1 capsule (20 mg total) by mouth daily. 30 capsule 1  . ondansetron (ZOFRAN) 8 MG tablet Take 1 tablet (8 mg total) by mouth 2 (two) times daily as needed for refractory nausea / vomiting. Start on day 3 after chemo. 30 tablet 1  . PARoxetine (PAXIL) 20 MG tablet Take 20 mg by mouth at bedtime.     . prednisoLONE sodium phosphate (INFLAMASE FORTE) 1 % ophthalmic solution Place 1 drop 2 (two) times daily as needed into the left eye (eye infection).     . prochlorperazine (COMPAZINE) 10 MG tablet Take 1 tablet (10 mg total) by mouth every 6 (six) hours as needed (Nausea or vomiting). 30 tablet 1  . zolpidem (AMBIEN) 5 MG tablet Take 1 tablet (5 mg total) by mouth at bedtime as needed for sleep. 30 tablet 1   No current facility-administered medications for this visit.     Review of Systems Review of Systems  Constitutional: Negative.   Respiratory: Negative.   Cardiovascular: Negative.     Blood pressure 136/78, pulse 89, resp. rate 14, height 5' (1.524 m), weight 224 lb (101.6 kg).  Physical Exam Physical Exam  Constitutional: She is oriented to person, place, and time. She appears well-developed and well-nourished.  Eyes: Conjunctivae are normal. No scleral icterus.  Neck: Neck supple.  Cardiovascular: Normal rate, regular rhythm and normal heart sounds.  Pulmonary/Chest: Effort normal and breath sounds normal. Right breast exhibits no inverted nipple, no mass, no nipple discharge, no skin change and no tenderness. Left breast exhibits no  inverted nipple, no mass, no nipple discharge, no skin change and no tenderness.    Lymphadenopathy:    She has no cervical adenopathy.    She has no axillary adenopathy.  Neurological: She is alert and oriented to person, place, and time.  Skin: Skin is warm and dry.  Psychiatric: She has a normal mood and affect.    Data Reviewed Medical oncology notes.  Assessment  Delayed healing at catheter insertion site without evidence of abscess.  Continue local hydrocortisone cream.    Plan    Keep skin lesion padded while wearing bra.  The patient has been instructed to call if the area has not completely healed by mid January, and to call at that time if healing has occurred..  Follow up in May.     HPI, Physical Exam, Assessment and Plan have been scribed under the direction and in the presence of Robert Bellow, MD  Concepcion Living, LPN  I have completed the exam and reviewed the above documentation for accuracy and completeness.  I agree with the above.  Haematologist has been used and any errors in dictation or transcription are unintentional.  Hervey Ard, M.D., F.A.C.S.  Robert Bellow 08/11/2017, 8:08 PM

## 2017-08-13 ENCOUNTER — Ambulatory Visit
Admission: RE | Admit: 2017-08-13 | Discharge: 2017-08-13 | Disposition: A | Discharge status: Home / Self Care | Payer: Medicaid Other | Source: Ambulatory Visit | Attending: Radiation Oncology | Admitting: Radiation Oncology

## 2017-08-13 ENCOUNTER — Encounter: Payer: Self-pay | Admitting: Radiation Oncology

## 2017-08-13 ENCOUNTER — Other Ambulatory Visit: Payer: Self-pay

## 2017-08-13 VITALS — BP 139/82 | HR 100 | Temp 98.1°F | Resp 20 | Wt 225.9 lb

## 2017-08-13 DIAGNOSIS — R5383 Other fatigue: Secondary | ICD-10-CM | POA: Diagnosis not present

## 2017-08-13 DIAGNOSIS — R531 Weakness: Secondary | ICD-10-CM | POA: Diagnosis not present

## 2017-08-13 DIAGNOSIS — Z170 Estrogen receptor positive status [ER+]: Secondary | ICD-10-CM | POA: Insufficient documentation

## 2017-08-13 DIAGNOSIS — C50212 Malignant neoplasm of upper-inner quadrant of left female breast: Secondary | ICD-10-CM | POA: Diagnosis present

## 2017-08-13 DIAGNOSIS — Z923 Personal history of irradiation: Secondary | ICD-10-CM | POA: Diagnosis not present

## 2017-08-13 NOTE — Progress Notes (Signed)
Radiation Oncology Follow up Note  Name: Sheena Martinez   Date:   08/13/2017 MRN:  [id number] DOB: 07-16-1961    This 57 y.o. female presents to the clinic today for one-month follow-up status post accelerated partial breast irradiation to her left breast for a stage I ER/PR positive invasive mammary carcinoma.  REFERRING PROVIDER: Herminio Commons, MD  HPI: Patient is a 57 year old female now out 1 month having completed accelerated partial breast radiation to her left breast for stage I (T1 be N0 M0) ER/PR positive invasive mammary carcinoma status post wide local excision.. She is seen today in routine follow-up and is doing well. Based on her high risk MammaPrint she is currently undergoing chemotherapy with Taxotere Cytoxan and on pro-. She feels weak and lethargic secondary to her chemotherapy. From a breast standpoint she is doing well specifically denies breast tenderness cough or bone pain.   COMPLICATIONS OF TREATMENT: none  FOLLOW UP COMPLIANCE: keeps appointments   PHYSICAL EXAM:  BP 139/82   Pulse 100   Temp 98.1 F (36.7 C)   Resp 20   Wt 225 lb 13.8 oz (102.4 kg)   BMI 44.11 kg/m  Lungs are clear to A&P cardiac examination essentially unremarkable with regular rate and rhythm. No dominant mass or nodularity is noted in either breast in 2 positions examined. Incision is well-healed. No axillary or supraclavicular adenopathy is appreciated. Cosmetic result is excellent. Still some thickness in her lumpectomy site which we would expect from accelerated partial breast radiation Well-developed well-nourished patient in NAD. HEENT reveals PERLA, EOMI, discs not visualized.  Oral cavity is clear. No oral mucosal lesions are identified. Neck is clear without evidence of cervical or supraclavicular adenopathy. Lungs are clear to A&P. Cardiac examination is essentially unremarkable with regular rate and rhythm without murmur rub or thrill. Abdomen is benign with no organomegaly or  masses noted. Motor sensory and DTR levels are equal and symmetric in the upper and lower extremities. Cranial nerves II through XII are grossly intact. Proprioception is intact. No peripheral adenopathy or edema is identified. No motor or sensory levels are noted. Crude visual fields are within normal range.  RADIOLOGY RESULTS: No current films for review  PLAN: Present time patient is doing well from a radiation standpoint. She continues to be a quite fatigued secondary to her chemotherapy. I have asked to see her back in 4-5 months for follow-up. Patient is to call at anytime for any concerns. She continues close follow-up care and chemotherapy with medical oncology.  I would like to take this opportunity to thank you for allowing me to participate in the care of your patient.Noreene Filbert, MD

## 2017-08-19 NOTE — Progress Notes (Signed)
Hematology/Oncology Follow up note [facility] [phone] [phone]   Patient Care Team: Herminio Commons, MD as PCP - General (Family Medicine) Rico Junker, RN as Registered Nurse Theodore Demark, RN as Registered Nurse  REFERRING PROVIDER: Herminio Commons, MD REASON FOR VISIT Follow up for treatment of  Breast cancer  HISTORY OF PRESENTING ILLNESS:  Sheena Martinez is a  57 y.o.  female with PMH listed below who was referred to me for evaluation of newly diagnosed breast cancer. Patient reports that she is been doing fine and she underwent screening annual mammogram which discovered abdominal findings.09/19/2018patient underwent screening, bilateral mammogram warrant further evaluation.on 05/12/2017, patient underwent diagnostic mammogram which discoveredleft breast irregular mass was spiculated margins in the upper slightly inner left breast. Ultrasound of the superior left breast showed hypoechoic mass with spiculated margins at 11:00, 4 cm from the nipple measuring 574 mm. Patient underwent biopsy of the left breast mass, pathology showed invasive mammary carcinoma. ER/PR >+90%, HER-2 IHC +2 equivocal, fish negative. Patient has been evaluated by Dr. Bary Castilla this week. Patient presented to the clinic today, accomplished by her daughter-in-law to establish care. Patient overall reports feeling well. She has a positive family history of breast cancer in her maternal aunt. 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 She does not have insurance pending applying Medicaid. She has a history of Sjogren disease for which she has had tear duct surgery. She also reports history of PCOS with elevated testosterone level. She is perimenopausal, her last menstrual period reported as 3 months ago, last for about 3 months. Denies any hormonal replacement therapy.   Pathology revealed a 6 mm ER PR positive,  HER-2 negative left invasive mammary carcinoma, margin is negative, no LV I. Pathological stage and pT1b N0. Mammoprintr was sent during intervaland came back with 10 year recurrence rate at 29% high risk group. Patient has completed MammoSite RT  Genetic testing:  Genetic testing result vis [facility] showed no pathogenic sequence appearance or deletions /duplications.  Current Treatment Adjuvant TC   INTERVAL HISTORY Patient  Presented for evaluation for adjuvant chemtherapy TC.  She reports tolerating last treatment very well. She had noticed some tooth discoloration.  Letter from dentist states that patient needs to have tooth extraction  And another tooth need restoration. And suggested Zometa to be delayed until these dental work finished. Patient decided not to have any dental procedure done.  she still feels hyper- and having insomnia despite taking Ambien. She usually gets 5 hours of  sleeping  denies finger tip sensitivities.    Review of Systems  Constitutional: Negative for chills, fever, malaise/fatigue and weight loss.  HENT: Negative for ear discharge, ear pain and hearing loss.   Eyes: Negative for blurred vision, double vision and pain.  Respiratory: Negative for cough, sputum production and shortness of breath.   Cardiovascular: Negative for chest pain, palpitations, orthopnea, claudication and leg swelling.  Gastrointestinal: Negative for blood in stool, constipation, diarrhea, nausea and vomiting.  Genitourinary: Negative for dysuria, frequency, hematuria and urgency.  Musculoskeletal: Negative for back pain, joint pain, myalgias and neck pain.  Skin: Negative for itching and rash.  Neurological: Negative for dizziness, tingling, tremors, sensory change and weakness.  Endo/Heme/Allergies: Negative for environmental allergies. Does not bruise/bleed easily.  Psychiatric/Behavioral: Negative for depression and memory loss. The patient has insomnia. The patient is not  nervous/anxious.    MEDICAL HISTORY:  Past Medical History:  Diagnosis Date  . Anxiety   . Arthritis   . Breast cancer ([place]) 05/12/2017   INVASIVE MAMMARY CARCINOMA ER/PR positive LEFT BREAST UPPER inner  QUAD  . GERD (gastroesophageal reflux disease)    OCC  . Hypothyroidism   . Sjogren's syndrome ([place])     SURGICAL HISTORY: Past Surgical History:  Procedure Laterality Date  . BREAST BIOPSY Left 05/12/2017   [place] bx/ INVASIVE MAMMARY CARCINOMA  . BREAST LUMPECTOMY WITH SENTINEL LYMPH NODE BIOPSY Left 06/10/2017   Procedure: BREAST LUMPECTOMY WITH SENTINEL LYMPH NODE BX AND NEEDLE LOCALIZATION;  Surgeon: Robert Bellow, MD;  Location: [facility] ORS;  Service: General;  Laterality: Left;  . BREAST MAMMOSITE Left 06/24/2017   Procedure: MAMMOSITE BREAST;  Surgeon: Robert Bellow, MD;  Location: [facility] ORS;  Service: General;  Laterality: Left;  . CESAREAN SECTION  1995  . COSMETIC SURGERY    . CYST EXCISION  2018   pilar cyst/ Dr Will Bonnet  . PORTACATH PLACEMENT Right 07/08/2017   Procedure: INSERTION PORT-A-CATH;  Surgeon: Robert Bellow, MD;  Location: [facility] ORS;  Service: General;  Laterality: Right;  . TONSILLECTOMY      SOCIAL HISTORY: Social History   Socioeconomic History  . Marital status: Married    Spouse name: Not on file  . Number of children: Not on file  . Years of education: Not on file  . Highest education level: Not on file  Social Needs  . Financial resource strain: Not on file  . Food insecurity - worry: Never true  . Food insecurity - inability: Never true  . Transportation needs - medical: No  . Transportation needs - non-medical: No  Occupational History  . Not on file  Tobacco Use  . Smoking status: Former Smoker    Years: 7.00    Types: Cigarettes    Last attempt to quit: 08/13/1979    Years since quitting: 38.0  . Smokeless tobacco: Never Used  . Tobacco comment: AGE [age]  Substance and Sexual Activity  . Alcohol use: No  . Drug use: No   . Sexual activity: Yes  Other Topics Concern  . Not on file  Social History Narrative  . Not on file    FAMILY HISTORY: Family History  Problem Relation Age of Onset  . Breast cancer Maternal Aunt 19       currently ~65  . Diabetes Mother   . Skin cancer Mother        currently 15; TAH/BSO (age?)  . Anemia Mother   . Liver disease Father        deceased / not much info about him / alcoholic  . Lung cancer Paternal Aunt        smoker / deceased 45s  . Stomach cancer Paternal Uncle 48       deceased / deceased 12s  . Ovarian cancer Maternal Grandmother 10       deceased 75s  . Stomach cancer Paternal Grandfather 36       deceased 6s  . Cancer Maternal Uncle        unk. type; deceased 74s  . Leukemia Cousin 35       deceased 66    ALLERGIES:  is allergic to sulfamethoxazole-trimethoprim.  MEDICATIONS:  Current Outpatient Medications  Medication Sig Dispense Refill  . acetaminophen (TYLENOL) 500 MG tablet Take 1,000 mg by mouth every 8 (eight) hours as needed for mild pain or headache.    . ALPRAZolam Duanne Moron)  0.25 MG tablet Take 1 tablet (0.25 mg total) by mouth 2 (two) times daily as needed for anxiety. 10 tablet 0  . Boric Acid POWD Place 1 capsule daily as needed vaginally (for pH levels).     . Ca Carbonate-Mag Hydroxide (ROLAIDS PO) Take 1 tablet as needed by mouth (indigestion).     . Carboxymethylcell-Hypromellose (GENTEAL OP) Apply 1 drop to eye 2 (two) times daily.    . chlorhexidine (PERIDEX) 0.12 % solution Use as directed 15 mLs in the mouth or throat 2 (two) times daily. 473 mL 0  . clotrimazole-betamethasone (LOTRISONE) cream Apply 1 application 2 (two) times a week topically.    Marland Kitchen dexamethasone (DECADRON) 4 MG tablet Take 2 tablets (8 mg total) by mouth 2 (two) times daily. Start the day before Taxotere. Then again the day after chemo for 3 days. 30 tablet 1  . hydrocortisone 1 % ointment Apply 1 application topically 2 (two) times daily. 28 g 0  .  ketoconazole (NIZORAL) 2 % shampoo Apply 1 application topically 2 (two) times a week.    . lidocaine-prilocaine (EMLA) cream Apply to affected area once 30 g 3  . loperamide (IMODIUM) 2 MG capsule Take 1 capsule (2 mg total) by mouth See admin instructions. With onset of loose stool, take '4mg'$  followed by '2mg'$  every 2 hours until 12 hours have passed without loose bowel movement. Maximum: 16 mg/day 120 capsule 1  . meloxicam (MOBIC) 15 MG tablet Take 15 mg by mouth at bedtime.     . mupirocin ointment (BACTROBAN) 2 % Place 1 application into the nose 2 (two) times daily. 22 g 0  . omeprazole (PRILOSEC) 20 MG capsule Take 1 capsule (20 mg total) by mouth daily. 30 capsule 1  . ondansetron (ZOFRAN) 8 MG tablet Take 1 tablet (8 mg total) by mouth 2 (two) times daily as needed for refractory nausea / vomiting. Start on day 3 after chemo. 30 tablet 1  . PARoxetine (PAXIL) 20 MG tablet Take 20 mg by mouth at bedtime.     . prednisoLONE sodium phosphate (INFLAMASE FORTE) 1 % ophthalmic solution Place 1 drop 2 (two) times daily as needed into the left eye (eye infection).     . prochlorperazine (COMPAZINE) 10 MG tablet Take 1 tablet (10 mg total) by mouth every 6 (six) hours as needed (Nausea or vomiting). 30 tablet 1  . zolpidem (AMBIEN) 5 MG tablet Take 1 tablet (5 mg total) by mouth at bedtime as needed for sleep. 30 tablet 1   No current facility-administered medications for this visit.       Marland Kitchen  PHYSICAL EXAMINATION: ECOG PERFORMANCE STATUS: 0 - Asymptomatic Vitals:   08/20/17 0955  BP: 121/78  Pulse: 93  Resp: 18  Temp: 98.4 F (36.9 C)   Physical Exam  Constitutional: She is oriented to person, place, and time and well-developed, well-nourished, and in no distress. No distress.  HENT:  Head: Normocephalic and atraumatic.  Eyes: Conjunctivae and EOM are normal. Pupils are equal, round, and reactive to light. No scleral icterus.  Neck: Normal range of motion. Neck supple. No thyromegaly  present.  Cardiovascular: Normal rate, regular rhythm and normal heart sounds.  No murmur heard. Pulmonary/Chest: Effort normal and breath sounds normal. No respiratory distress.  Abdominal: Soft. Bowel sounds are normal. She exhibits no distension.  Musculoskeletal: Normal range of motion. She exhibits no edema.  Lymphadenopathy:    She has no cervical adenopathy.  Neurological: She is alert and oriented  to person, place, and time. No cranial nerve deficit.  Skin: Skin is dry. No erythema.  Psychiatric: Affect normal.   LABORATORY DATA:  I have reviewed the data as listed: no recent labs.  Lab Results  Component Value Date   WBC 10.2 08/20/2017   HGB 11.1 (L) 08/20/2017   HCT 32.5 (L) 08/20/2017   MCV 92.3 08/20/2017   PLT 439 08/20/2017   Recent Labs    07/09/17 0858 07/16/17 1435 07/30/17 0852  NA 137 134* 137  K 4.0 3.9 3.8  CL 103 102 106  CO2 '28 25 23  '$ GLUCOSE 140* 107* 137*  BUN 21* 20 20  CREATININE 0.74 0.90 0.59  CALCIUM 8.8* 8.6* 9.1  GFRNONAA >60 >60 >60  GFRAA >60 >60 >60  PROT 6.6 6.0* 7.2  ALBUMIN 3.5 3.2* 3.4*  AST '21 31 27  '$ ALT 17 35 24  ALKPHOS 92 110 93  BILITOT 0.5 0.6 0.4       ASSESSMENT & PLAN:  57 yo premenopausal female with Stage 1A breast cancer, high risk mammaprint index currently on adjuvant chemotherapy with TC. Cancer Staging Malignant neoplasm of upper-inner quadrant of left breast in female, estrogen receptor positive ([place]) Staging form: Breast, AJCC 8th Edition - Clinical stage from 05/22/2017: Stage IA (cT1, cN0, cM0, G1, ER: Positive, PR: Positive, HER2: Negative) - Signed by Earlie Server, MD on 05/22/2017 - Pathologic stage from 07/09/2017: Stage IA (pT1b, pN0, cM0, G2, ER: Positive, PR: Positive, HER2: Negative) - Signed by Earlie Server, MD on 07/30/2017  1. Encounter for antineoplastic chemotherapy   2. Malignant neoplasm of upper-inner quadrant of left breast in female, estrogen receptor positive ([place])     #Tolerate  chemotherapy well.ok to proceed with cycle 3 Taxotere and Cytoxan + onpro.  # Dental clearance received. She needs to have tooth extracted and another tooth restoration work done. Discussed with patient and advised her to follow dentist suggestion and have required dental work to be done so that she can be cleared to get Zometa treatment.    #Anxiety and insomnia:  I suggest her to take only 2 steroid after chemotherapy instead of 3 days. If she continues to do well, she can omit her brief chemotherapy steroids before next cycle.she continued to take Xanax as needed She did take Ambien daily at bedtime as needed. She knows that she should not take os and extended together at night.  Patient understands and agree with the plan.   # Thrombocytosis is likely reactive. resolved  Return of visit: 3 weeks Lab/MD/ TC   Earlie Server, MD, PhD Hematology Oncology [facility] Pager- [phone] 08/20/2017

## 2017-08-20 ENCOUNTER — Inpatient Hospital Stay: Payer: Medicaid Other | Attending: Oncology

## 2017-08-20 ENCOUNTER — Inpatient Hospital Stay (HOSPITAL_BASED_OUTPATIENT_CLINIC_OR_DEPARTMENT_OTHER): Payer: Medicaid Other | Admitting: Oncology

## 2017-08-20 ENCOUNTER — Inpatient Hospital Stay: Payer: Medicaid Other

## 2017-08-20 ENCOUNTER — Encounter: Payer: Self-pay | Admitting: Oncology

## 2017-08-20 VITALS — BP 121/78 | HR 93 | Temp 98.4°F | Resp 18 | Wt 228.0 lb

## 2017-08-20 DIAGNOSIS — Z170 Estrogen receptor positive status [ER+]: Secondary | ICD-10-CM

## 2017-08-20 DIAGNOSIS — F419 Anxiety disorder, unspecified: Secondary | ICD-10-CM

## 2017-08-20 DIAGNOSIS — R530 Neoplastic (malignant) related fatigue: Secondary | ICD-10-CM | POA: Diagnosis not present

## 2017-08-20 DIAGNOSIS — Z8041 Family history of malignant neoplasm of ovary: Secondary | ICD-10-CM | POA: Diagnosis not present

## 2017-08-20 DIAGNOSIS — C50212 Malignant neoplasm of upper-inner quadrant of left female breast: Secondary | ICD-10-CM | POA: Insufficient documentation

## 2017-08-20 DIAGNOSIS — Z802 Family history of malignant neoplasm of other respiratory and intrathoracic organs: Secondary | ICD-10-CM | POA: Insufficient documentation

## 2017-08-20 DIAGNOSIS — F15982 Other stimulant use, unspecified with stimulant-induced sleep disorder: Secondary | ICD-10-CM | POA: Diagnosis not present

## 2017-08-20 DIAGNOSIS — Z809 Family history of malignant neoplasm, unspecified: Secondary | ICD-10-CM | POA: Diagnosis not present

## 2017-08-20 DIAGNOSIS — Z5189 Encounter for other specified aftercare: Secondary | ICD-10-CM | POA: Insufficient documentation

## 2017-08-20 DIAGNOSIS — D6481 Anemia due to antineoplastic chemotherapy: Secondary | ICD-10-CM | POA: Diagnosis not present

## 2017-08-20 DIAGNOSIS — Z808 Family history of malignant neoplasm of other organs or systems: Secondary | ICD-10-CM | POA: Insufficient documentation

## 2017-08-20 DIAGNOSIS — Z5111 Encounter for antineoplastic chemotherapy: Secondary | ICD-10-CM

## 2017-08-20 DIAGNOSIS — Z806 Family history of leukemia: Secondary | ICD-10-CM

## 2017-08-20 DIAGNOSIS — Z803 Family history of malignant neoplasm of breast: Secondary | ICD-10-CM | POA: Diagnosis not present

## 2017-08-20 DIAGNOSIS — Z800 Family history of malignant neoplasm of digestive organs: Secondary | ICD-10-CM | POA: Diagnosis not present

## 2017-08-20 DIAGNOSIS — D701 Agranulocytosis secondary to cancer chemotherapy: Secondary | ICD-10-CM | POA: Insufficient documentation

## 2017-08-20 LAB — CBC WITH DIFFERENTIAL/PLATELET
BASOS ABS: 0 10*3/uL (ref 0–0.1)
Basophils Relative: 0 %
EOS PCT: 0 %
Eosinophils Absolute: 0 10*3/uL (ref 0–0.7)
HCT: 32.5 % — ABNORMAL LOW (ref 35.0–47.0)
Hemoglobin: 11.1 g/dL — ABNORMAL LOW (ref 12.0–16.0)
LYMPHS PCT: 12 %
Lymphs Abs: 1.2 10*3/uL (ref 1.0–3.6)
MCH: 31.4 pg (ref 26.0–34.0)
MCHC: 34 g/dL (ref 32.0–36.0)
MCV: 92.3 fL (ref 80.0–100.0)
MONOS PCT: 3 %
Monocytes Absolute: 0.3 10*3/uL (ref 0.2–0.9)
Neutro Abs: 8.6 10*3/uL — ABNORMAL HIGH (ref 1.4–6.5)
Neutrophils Relative %: 85 %
PLATELETS: 439 10*3/uL (ref 150–440)
RBC: 3.52 MIL/uL — ABNORMAL LOW (ref 3.80–5.20)
RDW: 16 % — AB (ref 11.5–14.5)
WBC: 10.2 10*3/uL (ref 3.6–11.0)

## 2017-08-20 LAB — COMPREHENSIVE METABOLIC PANEL
ALBUMIN: 3.3 g/dL — AB (ref 3.5–5.0)
ALK PHOS: 79 U/L (ref 38–126)
ALT: 22 U/L (ref 14–54)
AST: 29 U/L (ref 15–41)
Anion gap: 8 (ref 5–15)
BILIRUBIN TOTAL: 0.4 mg/dL (ref 0.3–1.2)
BUN: 22 mg/dL — AB (ref 6–20)
CALCIUM: 9.1 mg/dL (ref 8.9–10.3)
CO2: 23 mmol/L (ref 22–32)
Chloride: 107 mmol/L (ref 101–111)
Creatinine, Ser: 0.65 mg/dL (ref 0.44–1.00)
GFR calc Af Amer: >60 mL/min (ref >60)
GFR calc non Af Amer: >60 mL/min (ref >60)
Glucose, Bld: 160 mg/dL — ABNORMAL HIGH (ref 65–99)
POTASSIUM: 3.9 mmol/L (ref 3.5–5.1)
Sodium: 138 mmol/L (ref 135–145)
TOTAL PROTEIN: 6.5 g/dL (ref 6.5–8.1)

## 2017-08-20 MED ORDER — SODIUM CHLORIDE 0.9 % IV SOLN: 10.0000 mg | Freq: Once | INTRAVENOUS | Status: DC

## 2017-08-20 MED ORDER — PEGFILGRASTIM 6 MG/0.6ML ~~LOC~~ PSKT
6.0000 mg | PREFILLED_SYRINGE | Freq: Once | SUBCUTANEOUS | Status: AC
  Administered 2017-08-20: 6 mg via SUBCUTANEOUS
  Filled 2017-08-20: qty 0.6

## 2017-08-20 MED ORDER — DEXAMETHASONE SODIUM PHOSPHATE 10 MG/ML IJ SOLN
10.0000 mg | Freq: Once | INTRAMUSCULAR | Status: AC
  Administered 2017-08-20: 10 mg via INTRAVENOUS
  Filled 2017-08-20: qty 1

## 2017-08-20 MED ORDER — PALONOSETRON HCL INJECTION 0.25 MG/5ML
0.2500 mg | Freq: Once | INTRAVENOUS | Status: AC
  Administered 2017-08-20: 0.25 mg via INTRAVENOUS
  Filled 2017-08-20: qty 5

## 2017-08-20 MED ORDER — SODIUM CHLORIDE 0.9 % IV SOLN
Freq: Once | INTRAVENOUS | Status: AC
  Administered 2017-08-20: 11:00:00 via INTRAVENOUS
  Filled 2017-08-20: qty 1000

## 2017-08-20 MED ORDER — SODIUM CHLORIDE 0.9 % IV SOLN
600.0000 mg/m2 | Freq: Once | INTRAVENOUS | Status: AC
  Administered 2017-08-20: 1260 mg via INTRAVENOUS
  Filled 2017-08-20: qty 50

## 2017-08-20 MED ORDER — HEPARIN SOD (PORK) LOCK FLUSH 100 UNIT/ML IV SOLN
500.0000 [IU] | Freq: Once | INTRAVENOUS | Status: AC | PRN
  Administered 2017-08-20: 500 [IU]
  Filled 2017-08-20: qty 5

## 2017-08-20 MED ORDER — SODIUM CHLORIDE 0.9 % IV SOLN
75.0000 mg/m2 | Freq: Once | INTRAVENOUS | Status: AC
  Administered 2017-08-20: 160 mg via INTRAVENOUS
  Filled 2017-08-20: qty 16

## 2017-08-28 ENCOUNTER — Inpatient Hospital Stay: Payer: Medicaid Other

## 2017-08-28 ENCOUNTER — Telehealth: Payer: Self-pay | Admitting: *Deleted

## 2017-08-28 ENCOUNTER — Inpatient Hospital Stay (HOSPITAL_BASED_OUTPATIENT_CLINIC_OR_DEPARTMENT_OTHER): Payer: Medicaid Other | Admitting: Oncology

## 2017-08-28 DIAGNOSIS — C50212 Malignant neoplasm of upper-inner quadrant of left female breast: Secondary | ICD-10-CM

## 2017-08-28 DIAGNOSIS — Z5189 Encounter for other specified aftercare: Secondary | ICD-10-CM

## 2017-08-28 DIAGNOSIS — R530 Neoplastic (malignant) related fatigue: Secondary | ICD-10-CM

## 2017-08-28 DIAGNOSIS — Z5111 Encounter for antineoplastic chemotherapy: Secondary | ICD-10-CM | POA: Diagnosis not present

## 2017-08-28 DIAGNOSIS — R5383 Other fatigue: Secondary | ICD-10-CM

## 2017-08-28 DIAGNOSIS — D702 Other drug-induced agranulocytosis: Secondary | ICD-10-CM | POA: Diagnosis not present

## 2017-08-28 DIAGNOSIS — R531 Weakness: Secondary | ICD-10-CM | POA: Diagnosis not present

## 2017-08-28 LAB — COMPREHENSIVE METABOLIC PANEL
ALT: 24 U/L (ref 14–54)
ANION GAP: 7 (ref 5–15)
AST: 29 U/L (ref 15–41)
Albumin: 3.2 g/dL — ABNORMAL LOW (ref 3.5–5.0)
Alkaline Phosphatase: 114 U/L (ref 38–126)
BILIRUBIN TOTAL: 0.5 mg/dL (ref 0.3–1.2)
BUN: 21 mg/dL — ABNORMAL HIGH (ref 6–20)
CALCIUM: 8.7 mg/dL — AB (ref 8.9–10.3)
CO2: 26 mmol/L (ref 22–32)
CREATININE: 0.64 mg/dL (ref 0.44–1.00)
Chloride: 105 mmol/L (ref 101–111)
GFR calc non Af Amer: >60 mL/min (ref >60)
Glucose, Bld: 109 mg/dL — ABNORMAL HIGH (ref 65–99)
Potassium: 4 mmol/L (ref 3.5–5.1)
Sodium: 138 mmol/L (ref 135–145)
TOTAL PROTEIN: 6 g/dL — AB (ref 6.5–8.1)

## 2017-08-28 LAB — CBC WITH DIFFERENTIAL/PLATELET
BASOS ABS: 0.3 10*3/uL — AB (ref 0–0.1)
BASOS PCT: 1 %
Eosinophils Absolute: 0 10*3/uL (ref 0–0.7)
Eosinophils Relative: 0 %
HEMATOCRIT: 33.9 % — AB (ref 35.0–47.0)
Hemoglobin: 11.2 g/dL — ABNORMAL LOW (ref 12.0–16.0)
LYMPHS PCT: 9 %
Lymphs Abs: 2.7 10*3/uL (ref 1.0–3.6)
MCH: 30.8 pg (ref 26.0–34.0)
MCHC: 33.2 g/dL (ref 32.0–36.0)
MCV: 92.7 fL (ref 80.0–100.0)
MONO ABS: 3.3 10*3/uL — AB (ref 0.2–0.9)
Monocytes Relative: 11 %
NEUTROS ABS: 23.9 10*3/uL — AB (ref 1.4–6.5)
NEUTROS PCT: 79 %
PLATELETS: 260 10*3/uL (ref 150–440)
RBC: 3.66 MIL/uL — ABNORMAL LOW (ref 3.80–5.20)
RDW: 16.8 % — ABNORMAL HIGH (ref 11.5–14.5)
Smear Review: ADEQUATE
WBC: 30.2 10*3/uL — ABNORMAL HIGH (ref 3.6–11.0)

## 2017-08-28 NOTE — Telephone Encounter (Signed)
Chemotherapy a week ago, symptoms of extreme fatigue, weakness, pain in all joints has no energy even now. Denies vomiting, does have some nausea, has taste alterations. Feel like her entire trunk inside is burning. She received her third cycle of TC 1/9.  Per Sheena Martinez, lab, see her and possible IV fluids. Patient accepts appointment for 2PM

## 2017-08-28 NOTE — Progress Notes (Signed)
Symptom Management Consult note [facility]  [phone] [phone]  Patient Care Team: Herminio Commons, MD as PCP - General (Family Medicine) Rico Junker, RN as Registered Nurse Theodore Demark, RN as Registered Nurse   Name of the patient: Sheena Martinez  [id number]  [birth year]   Date of visit: 09/01/17  Diagnosis- Breast cancer  Chief complaint/ Reason for visit-Severe weakness/Feeling overall "lousy"  Heme/Onc history: Sheena Martinez is a  57 y.o.  female with PMH listed below who was referred for evaluation of newly diagnosed breast cancer. Patient reports that she was doing "well" and she underwent screening annual mammogram which discovered abdominal findings on 04/30/2017. Patient underwent screening, bilateral mammogram warrant further evaluation.on 05/12/2017, patient underwent diagnostic mammogram which discovered left breast irregular mass with spiculated margins in the upper slightly inner left breast. Ultrasound of the superior left breast showed hypoechoic mass with spiculated margins at 11:00, 4 cm from the nipple measuring 574 mm. Patient underwent biopsy of the left breast mass, pathology showed invasive mammary carcinoma. ER/PR >+90%, HER-2 IHC +2 equivocal, fish negative. Patient has been evaluated by Dr. Bary Castilla this week. Patient presented to the clinic today, accomplished by her daughter-in-law to establish care. Patient overall reports feeling well. She has a positive family history of breast cancer in her maternal aunt. 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 She does not have insurance pending applying Medicaid. She has a history of Sjogren disease for which she has had tear duct surgery. She also reports history of PCOS with elevated testosterone level. She is perimenopausal, her last menstrual period reported as 3 months ago, last for about 3 months. Denies any  hormonal replacement therapy.   Pathology revealed a 6 mm ER PR positive, HER-2 negative left invasive mammary carcinoma, margin is negative, no LV I. Pathological stage and pT1b N0. Mammoprintr was sent during intervaland came back with 10 year recurrence rate at 29% high risk group. Patient has completed MammoSite RT  Genetic testing: Genetic testing result vis [facility] showed no pathogenic sequence appearance or deletions /duplications.  Interval history- Patient presents today with her son for overall "feeling horrible".  Last chemo was cycle 3 on 08/30/2017.  Patient reports symptoms of nausea without vomiting, severe weakness, fatigue and "feels like I am burning from the inside out".  Additionally she complains of bony pain but relates this to her Neulasta.  She has barely left her bed since chemotherapy.  She has one additional cycle but patient states she has reservation to receive this given how she feels.  She has been compliant with all of her PRN medications.  She denies any pain.  She denies any recent infections.  Her bowels have been moving daily without any issues and denies any urinary complaints.  She is scheduled to have a tooth pulled next Wednesday and will have labs drawn Tuesday.  She is scheduled to start Zometa after dental clearance is obtained. States she is hesitant to have this procedure if she does not feel better.  She admits to occasional chills and diaphoresis but has not checked her temperature at home.  Offers no further complaints.  ECOG FS:1 - Symptomatic but completely ambulatory  Review of systems- Review of Systems  Constitutional: Positive for chills, diaphoresis, malaise/fatigue and weight loss. Negative for fever.  HENT: Negative.   Eyes: Negative.   Cardiovascular: Negative.   Gastrointestinal: Positive for  nausea. Negative for abdominal pain, blood in stool, constipation, diarrhea and vomiting.  Genitourinary: Negative.   Musculoskeletal: Positive for  myalgias. Negative for falls.  Skin: Negative.   Neurological: Positive for weakness.  Endo/Heme/Allergies: Negative.   Psychiatric/Behavioral: Negative.      Current treatment- S/P Cycle 3 of adjuvant AC on 08/20/2017.  Allergies  Allergen Reactions  . Sulfamethoxazole-Trimethoprim Rash    Chest tightness. Bactrim     Past Medical History:  Diagnosis Date  . Anxiety   . Arthritis   . Breast cancer ([place]) 05/12/2017   INVASIVE MAMMARY CARCINOMA ER/PR positive LEFT BREAST UPPER inner  QUAD  . GERD (gastroesophageal reflux disease)    OCC  . Hypothyroidism   . Sjogren's syndrome [facility])      Past Surgical History:  Procedure Laterality Date  . BREAST BIOPSY Left 05/12/2017   [place] bx/ INVASIVE MAMMARY CARCINOMA  . BREAST LUMPECTOMY WITH SENTINEL LYMPH NODE BIOPSY Left 06/10/2017   Procedure: BREAST LUMPECTOMY WITH SENTINEL LYMPH NODE BX AND NEEDLE LOCALIZATION;  Surgeon: Robert Bellow, MD;  Location: [facility] ORS;  Service: General;  Laterality: Left;  . BREAST MAMMOSITE Left 06/24/2017   Procedure: MAMMOSITE BREAST;  Surgeon: Robert Bellow, MD;  Location: [facility] ORS;  Service: General;  Laterality: Left;  . CESAREAN SECTION  1995  . COSMETIC SURGERY    . CYST EXCISION  2018   pilar cyst/ Dr Will Bonnet  . PORTACATH PLACEMENT Right 07/08/2017   Procedure: INSERTION PORT-A-CATH;  Surgeon: Robert Bellow, MD;  Location: [facility] ORS;  Service: General;  Laterality: Right;  . TONSILLECTOMY      Social History   Socioeconomic History  . Marital status: Married    Spouse name: Not on file  . Number of children: Not on file  . Years of education: Not on file  . Highest education level: Not on file  Social Needs  . Financial resource strain: Not on file  . Food insecurity - worry: Never true  . Food insecurity - inability: Never true  . Transportation needs - medical: No  . Transportation needs - non-medical: No  Occupational History  . Not on file  Tobacco Use  . Smoking  status: Former Smoker    Years: 7.00    Types: Cigarettes    Last attempt to quit: 08/13/1979    Years since quitting: 38.0  . Smokeless tobacco: Never Used  . Tobacco comment: AGE [age]  Substance and Sexual Activity  . Alcohol use: No  . Drug use: No  . Sexual activity: Yes  Other Topics Concern  . Not on file  Social History Narrative  . Not on file    Family History  Problem Relation Age of Onset  . Breast cancer Maternal Aunt 69       currently ~65  . Diabetes Mother   . Skin cancer Mother        currently 73; TAH/BSO (age?)  . Anemia Mother   . Liver disease Father        deceased / not much info about him / alcoholic  . Lung cancer Paternal Aunt        smoker / deceased 81s  . Stomach cancer Paternal Uncle 75       deceased / deceased 63s  . Ovarian cancer Maternal Grandmother 66       deceased 39s  . Stomach cancer Paternal Grandfather 48       deceased 45s  . Cancer Maternal Uncle  unk. type; deceased 62s  . Leukemia Cousin 38       deceased 21     Current Outpatient Medications:  .  acetaminophen (TYLENOL) 500 MG tablet, Take 1,000 mg by mouth every 8 (eight) hours as needed for mild pain or headache., Disp: , Rfl:  .  ALPRAZolam (XANAX) 0.25 MG tablet, Take 1 tablet (0.25 mg total) by mouth 2 (two) times daily as needed for anxiety., Disp: 10 tablet, Rfl: 0 .  Boric Acid POWD, Place 1 capsule daily as needed vaginally (for pH levels). , Disp: , Rfl:  .  Ca Carbonate-Mag Hydroxide (ROLAIDS PO), Take 1 tablet as needed by mouth (indigestion). , Disp: , Rfl:  .  Carboxymethylcell-Hypromellose (GENTEAL OP), Apply 1 drop to eye 2 (two) times daily., Disp: , Rfl:  .  chlorhexidine (PERIDEX) 0.12 % solution, Use as directed 15 mLs in the mouth or throat 2 (two) times daily., Disp: 473 mL, Rfl: 0 .  clotrimazole-betamethasone (LOTRISONE) cream, Apply 1 application 2 (two) times a week topically., Disp: , Rfl:  .  dexamethasone (DECADRON) 4 MG tablet, Take 2  tablets (8 mg total) by mouth 2 (two) times daily. Start the day before Taxotere. Then again the day after chemo for 3 days., Disp: 30 tablet, Rfl: 1 .  hydrocortisone 1 % ointment, Apply 1 application topically 2 (two) times daily., Disp: 28 g, Rfl: 0 .  ketoconazole (NIZORAL) 2 % shampoo, Apply 1 application topically 2 (two) times a week., Disp: , Rfl:  .  lidocaine-prilocaine (EMLA) cream, Apply to affected area once, Disp: 30 g, Rfl: 3 .  loperamide (IMODIUM) 2 MG capsule, Take 1 capsule (2 mg total) by mouth See admin instructions. With onset of loose stool, take 42m followed by 222mevery 2 hours until 12 hours have passed without loose bowel movement. Maximum: 16 mg/day, Disp: 120 capsule, Rfl: 1 .  meloxicam (MOBIC) 15 MG tablet, Take 15 mg by mouth at bedtime. , Disp: , Rfl:  .  mupirocin ointment (BACTROBAN) 2 %, Place 1 application into the nose 2 (two) times daily., Disp: 22 g, Rfl: 0 .  omeprazole (PRILOSEC) 20 MG capsule, Take 1 capsule (20 mg total) by mouth daily., Disp: 30 capsule, Rfl: 1 .  ondansetron (ZOFRAN) 8 MG tablet, Take 1 tablet (8 mg total) by mouth 2 (two) times daily as needed for refractory nausea / vomiting. Start on day 3 after chemo., Disp: 30 tablet, Rfl: 1 .  PARoxetine (PAXIL) 20 MG tablet, Take 20 mg by mouth at bedtime. , Disp: , Rfl:  .  prednisoLONE sodium phosphate (INFLAMASE FORTE) 1 % ophthalmic solution, Place 1 drop 2 (two) times daily as needed into the left eye (eye infection). , Disp: , Rfl:  .  prochlorperazine (COMPAZINE) 10 MG tablet, Take 1 tablet (10 mg total) by mouth every 6 (six) hours as needed (Nausea or vomiting)., Disp: 30 tablet, Rfl: 1 .  zolpidem (AMBIEN) 5 MG tablet, Take 1 tablet (5 mg total) by mouth at bedtime as needed for sleep., Disp: 30 tablet, Rfl: 1  Physical exam: There were no vitals filed for this visit. Physical Exam  Constitutional: She is oriented to person, place, and time and well-developed, well-nourished, and in no  distress. Vital signs are normal.  Obese.  Weight up 4 pounds.   HENT:  Head: Normocephalic and atraumatic.  Mouth/Throat: Oropharynx is clear and moist and mucous membranes are normal.  Eyes: Conjunctivae and EOM are normal. Pupils are equal, round,  and reactive to light.  Neck: Normal range of motion and full passive range of motion without pain. Neck supple. No edema present.  Cardiovascular: Normal rate, regular rhythm and normal heart sounds.  Pulmonary/Chest: Effort normal and breath sounds normal.  Abdominal: Soft. Normal appearance and bowel sounds are normal.  Musculoskeletal: Normal range of motion.  Lymphadenopathy:    She has no cervical adenopathy.    She has no axillary adenopathy.  Neurological: She is alert and oriented to person, place, and time. She has normal motor skills. Gait normal.  Skin: Skin is warm, dry and intact.  Psychiatric: Mood, memory, affect and judgment normal.     CMP Latest Ref Rng & Units 08/28/2017  Glucose 65 - 99 mg/dL 109(H)  BUN 6 - 20 mg/dL 21(H)  Creatinine 0.44 - 1.00 mg/dL 0.64  Sodium 135 - 145 mmol/L 138  Potassium 3.5 - 5.1 mmol/L 4.0  Chloride 101 - 111 mmol/L 105  CO2 22 - 32 mmol/L 26  Calcium 8.9 - 10.3 mg/dL 8.7(L)  Total Protein 6.5 - 8.1 g/dL 6.0(L)  Total Bilirubin 0.3 - 1.2 mg/dL 0.5  Alkaline Phos 38 - 126 U/L 114  AST 15 - 41 U/L 29  ALT 14 - 54 U/L 24   CBC Latest Ref Rng & Units 08/28/2017  WBC 3.6 - 11.0 K/uL 30.2(H)  Hemoglobin 12.0 - 16.0 g/dL 11.2(L)  Hematocrit 35.0 - 47.0 % 33.9(L)  Platelets 150 - 440 K/uL 260    No images are attached to the encounter.  No results found.   Assessment and plan- Patient is a 57 y.o. female who presents for weakness and fatigue.  On examination patient appears well.  Mucous membranes moist.  No oral exudate.  No pallor.  Vital signs stable.  Afebrile.  Weight up 4 pounds.  Labs reveal neutropenia (30.2), anemia (11.2), low albumin (3.2), hypocalcemia (8.7) and elevated BUN  (21). Normal peripheral smear.  No edema in lower extremities.  1.  Neutropenia: R/T Neulasta.  Patient does complain of chills and diaphoresis at times.  She is afebrile.  Recommend her to check her temperature when these symptoms occur to see if she is febrile. Keep a log.  She denies any infection like symptoms.  No cough, congestion or pain. Dr. Tasia Catchings updated.  2. Anemia: R/T chemotherapy. No bleeding or bruising. 3. Weakness/fatigue: Educated the patient that chemotherapy is cumulative and that this is likely related to recent therapy.  Patient feels that she is eating and drinking enough and given her weight and labs it is unlikely she is dehydrated.  Her appetite is well.  Labs are okay.   Addendum: Spoke to patient on Friday 1/18/201 to follow-up and see how she was doing.  Patient states she is feeling much better.  She was able to get a good night sleep last night and thinks that was half of her problem.  She feels well rested today.  She has more energy.  She has not checked her temperature as we discussed yesterday.  She states "I keep forgetting".  She does not think she has a fever.  She is having labs drawn on Tuesday and we will see how those look.  Otherwise she does not have any additional complaints today.   Visit Diagnosis 1. Drug-induced neutropenia (HCC)   2. Fatigue due to treatment   3. Weakness generalized     Patient expressed understanding and was in agreement with this plan. She also understands that She can call clinic  at any time with any questions, concerns, or complaints.   Greater than 50% was spent in counseling and coordination of care with this patient including but not limited to discussion of the relevant topics above (See A&P) including, but not limited to diagnosis and management of acute and chronic medical conditions.    Faythe Casa, AGNP-C [facility] [place]- [phone] Pager- [phone] 09/01/2017 10:23 AM

## 2017-08-29 ENCOUNTER — Telehealth: Payer: Self-pay | Admitting: Oncology

## 2017-08-29 NOTE — Telephone Encounter (Signed)
Called patient to check up regarding visit from yesterday.  She is feeling much better and states she may have just needed a good night sleep. She was asked to check her temperature several times throughout the day especially when she had chills, but she unfortunately did not check it today.  She does not think she has a fever but states this is "menopausal" . She does admit to increasing her fluid consumption today. She has more energy today then yesterday. She is scheduled to return next week for labs prior to her dental procedure and to see Dr. Tasia Catchings in approximately 2 weeks.  Faythe Casa, NP 08/29/2017 3:57 PM

## 2017-09-01 ENCOUNTER — Telehealth: Payer: Self-pay | Admitting: *Deleted

## 2017-09-01 NOTE — Telephone Encounter (Signed)
Patient advided per VO Alease Medina, NP to go to Urgent Care or Walk in clinic regarding her eye. She was not happy, about this and said, "Well that's just great. OK"

## 2017-09-01 NOTE — Telephone Encounter (Signed)
Asking if we will call in some eye drops for  "Pink Eye" States she does not have a PCP at this time because hers moved out of town and she has not bothered to get another one yet. Please advise

## 2017-09-02 ENCOUNTER — Inpatient Hospital Stay: Payer: Medicaid Other

## 2017-09-02 DIAGNOSIS — Z170 Estrogen receptor positive status [ER+]: Secondary | ICD-10-CM

## 2017-09-02 DIAGNOSIS — Z5111 Encounter for antineoplastic chemotherapy: Secondary | ICD-10-CM | POA: Diagnosis not present

## 2017-09-02 DIAGNOSIS — C50212 Malignant neoplasm of upper-inner quadrant of left female breast: Secondary | ICD-10-CM

## 2017-09-02 LAB — COMPREHENSIVE METABOLIC PANEL
ALBUMIN: 3.1 g/dL — AB (ref 3.5–5.0)
ALK PHOS: 119 U/L (ref 38–126)
ALT: 25 U/L (ref 14–54)
AST: 24 U/L (ref 15–41)
Anion gap: 7 (ref 5–15)
BUN: 14 mg/dL (ref 6–20)
CALCIUM: 8.8 mg/dL — AB (ref 8.9–10.3)
CHLORIDE: 104 mmol/L (ref 101–111)
CO2: 27 mmol/L (ref 22–32)
CREATININE: 0.68 mg/dL (ref 0.44–1.00)
GFR calc non Af Amer: >60 mL/min (ref >60)
Glucose, Bld: 116 mg/dL — ABNORMAL HIGH (ref 65–99)
Potassium: 3.7 mmol/L (ref 3.5–5.1)
SODIUM: 138 mmol/L (ref 135–145)
Total Bilirubin: 0.6 mg/dL (ref 0.3–1.2)
Total Protein: 6.1 g/dL — ABNORMAL LOW (ref 6.5–8.1)

## 2017-09-02 LAB — CBC WITH DIFFERENTIAL/PLATELET
BASOS ABS: 0.1 10*3/uL (ref 0–0.1)
Basophils Relative: 1 %
EOS PCT: 0 %
Eosinophils Absolute: 0 10*3/uL (ref 0–0.7)
HCT: 33 % — ABNORMAL LOW (ref 35.0–47.0)
Hemoglobin: 11 g/dL — ABNORMAL LOW (ref 12.0–16.0)
LYMPHS PCT: 11 %
Lymphs Abs: 1.3 10*3/uL (ref 1.0–3.6)
MCH: 31.1 pg (ref 26.0–34.0)
MCHC: 33.2 g/dL (ref 32.0–36.0)
MCV: 93.7 fL (ref 80.0–100.0)
MONO ABS: 0.6 10*3/uL (ref 0.2–0.9)
Monocytes Relative: 6 %
Neutro Abs: 9.3 10*3/uL — ABNORMAL HIGH (ref 1.4–6.5)
Neutrophils Relative %: 82 %
PLATELETS: 206 10*3/uL (ref 150–440)
RBC: 3.52 MIL/uL — ABNORMAL LOW (ref 3.80–5.20)
RDW: 17.2 % — AB (ref 11.5–14.5)
WBC: 11.4 10*3/uL — ABNORMAL HIGH (ref 3.6–11.0)

## 2017-09-09 NOTE — Progress Notes (Signed)
Hematology/Oncology Follow up note [facility] Telephone:([phone]) [phone] Fax:([phone]) [phone]   Patient Care Team: Patient, No Pcp Per as PCP - General (General Practice) Rico Junker, RN as Registered Nurse Theodore Demark, RN as Registered Nurse  REFERRING PROVIDER: Patient, No Pcp Per REASON FOR VISIT Follow up for treatment of  Breast cancer  HISTORY OF PRESENTING ILLNESS:  Sheena Martinez is a  57 y.o.  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 Margin is negative, no LVI. Mammoprintr revealed 10 year recurrence rate at 29% high risk group. Patient has completed MammoSite RT. Denies any hormonal replacement therapy.  Adjuvant chemotherapy with TC, finished 3 cycles.  She is perimenopausal.   # His Sjogren disease for which she has had tear duct surgery. She also reports history of PCOS with elevated testosterone level.   Genetic testing:  Genetic testing result vis [facility] showed no pathogenic sequence appearance or deletions /duplications.  Current Treatment Adjuvant TCx3  INTERVAL HISTORY Patient  Presented for evaluation for adjuvant chemtherapy TC.  Patient reports "I feel very bad after the third cycle of chemotherapy".  She feels fatigued and some nausea. Denies any vomiting, fever or chills.  2 weeks ago she started to have left eye itchiness for which she has to rub.  She was seen by primary care physician and was given antibiotic eyedrops which she has used for 9 days without symptom improvement.  She has had a tooth extraction yesterday and was prescribed to take amoxicillin for 7 days.  Review of Systems  Constitutional: Positive for malaise/fatigue. Negative for chills and fever.  HENT: Negative for ear pain and hearing loss.   Eyes: Positive for discharge and redness.  Respiratory: Negative for cough.   Cardiovascular: Negative for chest pain and palpitations.    Gastrointestinal: Positive for nausea. Negative for diarrhea and vomiting.  Genitourinary: Negative for dysuria.  Musculoskeletal: Negative for joint pain.  Skin: Negative for rash.  Neurological: Negative for dizziness, tingling, sensory change and weakness.  Endo/Heme/Allergies: Negative for environmental allergies. Does not bruise/bleed easily.  Psychiatric/Behavioral: Negative for memory loss. The patient has insomnia. The patient is not nervous/anxious.    MEDICAL HISTORY:  Past Medical History:  Diagnosis Date  . Anxiety   . Arthritis   . Breast cancer ([place]) 05/12/2017   INVASIVE MAMMARY CARCINOMA ER/PR positive LEFT BREAST UPPER inner  QUAD  . GERD (gastroesophageal reflux disease)    OCC  . Hypothyroidism   . Sjogren's syndrome ([place])     SURGICAL HISTORY: Past Surgical History:  Procedure Laterality Date  . BREAST BIOPSY Left 05/12/2017   [place] bx/ INVASIVE MAMMARY CARCINOMA  . BREAST LUMPECTOMY WITH SENTINEL LYMPH NODE BIOPSY Left 06/10/2017   Procedure: BREAST LUMPECTOMY WITH SENTINEL LYMPH NODE BX AND NEEDLE LOCALIZATION;  Surgeon: Robert Bellow, MD;  Location: [facility] ORS;  Service: General;  Laterality: Left;  . BREAST MAMMOSITE Left 06/24/2017   Procedure: MAMMOSITE BREAST;  Surgeon: Robert Bellow, MD;  Location: [facility] ORS;  Service: General;  Laterality: Left;  . CESAREAN SECTION  1995  . COSMETIC SURGERY    . CYST EXCISION  2018   pilar cyst/ Dr Will Bonnet  . PORTACATH PLACEMENT Right 07/08/2017   Procedure: INSERTION PORT-A-CATH;  Surgeon: Robert Bellow, MD;  Location: [facility] ORS;  Service: General;  Laterality: Right;  . TONSILLECTOMY      SOCIAL HISTORY: Social History   Socioeconomic History  . Marital status:  Married    Spouse name: Not on file  . Number of children: Not on file  . Years of education: Not on file  . Highest education level: Not on file  Social Needs  . Financial resource strain: Not on file  . Food insecurity - worry: Never  true  . Food insecurity - inability: Never true  . Transportation needs - medical: No  . Transportation needs - non-medical: No  Occupational History  . Not on file  Tobacco Use  . Smoking status: Former Smoker    Years: 7.00    Types: Cigarettes    Last attempt to quit: 08/13/1979    Years since quitting: 38.1  . Smokeless tobacco: Never Used  . Tobacco comment: AGE [age]  Substance and Sexual Activity  . Alcohol use: No  . Drug use: No  . Sexual activity: Yes  Other Topics Concern  . Not on file  Social History Narrative  . Not on file    FAMILY HISTORY: Family History  Problem Relation Age of Onset  . Breast cancer Maternal Aunt 44       currently ~65  . Diabetes Mother   . Skin cancer Mother        currently 27; TAH/BSO (age?)  . Anemia Mother   . Liver disease Father        deceased / not much info about him / alcoholic  . Lung cancer Paternal Aunt        smoker / deceased 61s  . Stomach cancer Paternal Uncle 65       deceased / deceased 41s  . Ovarian cancer Maternal Grandmother 65       deceased 32s  . Stomach cancer Paternal Grandfather 95       deceased 70s  . Cancer Maternal Uncle        unk. type; deceased 50s  . Leukemia Cousin 70       deceased 69    ALLERGIES:  is allergic to sulfamethoxazole-trimethoprim.  MEDICATIONS:  Current Outpatient Medications  Medication Sig Dispense Refill  . acetaminophen (TYLENOL) 500 MG tablet Take 1,000 mg by mouth every 8 (eight) hours as needed for mild pain or headache.    . ALPRAZolam (XANAX) 0.25 MG tablet Take 1 tablet (0.25 mg total) by mouth 2 (two) times daily as needed for anxiety. 10 tablet 0  . Boric Acid POWD Place 1 capsule daily as needed vaginally (for pH levels).     . Ca Carbonate-Mag Hydroxide (ROLAIDS PO) Take 1 tablet as needed by mouth (indigestion).     . Carboxymethylcell-Hypromellose (GENTEAL OP) Apply 1 drop to eye 2 (two) times daily.    . chlorhexidine (PERIDEX) 0.12 % solution Use as  directed 15 mLs in the mouth or throat 2 (two) times daily. 473 mL 0  . clotrimazole-betamethasone (LOTRISONE) cream Apply 1 application 2 (two) times a week topically.    Marland Kitchen dexamethasone (DECADRON) 4 MG tablet Take 2 tablets (8 mg total) by mouth 2 (two) times daily. Start the day before Taxotere. Then again the day after chemo for 3 days. 30 tablet 1  . gentamicin-prednisoLONE 0.3-1 % ophthalmic drops 1 drop 2 (two) times daily.    . hydrocortisone 1 % ointment Apply 1 application topically 2 (two) times daily. 28 g 0  . ketoconazole (NIZORAL) 2 % shampoo Apply 1 application topically 2 (two) times a week.    . lidocaine-prilocaine (EMLA) cream Apply to affected area once 30 g 3  .  loperamide (IMODIUM) 2 MG capsule Take 1 capsule (2 mg total) by mouth See admin instructions. With onset of loose stool, take '4mg'$  followed by '2mg'$  every 2 hours until 12 hours have passed without loose bowel movement. Maximum: 16 mg/day 120 capsule 1  . meloxicam (MOBIC) 15 MG tablet Take 15 mg by mouth at bedtime.     . mupirocin ointment (BACTROBAN) 2 % Place 1 application into the nose 2 (two) times daily. 22 g 0  . omeprazole (PRILOSEC) 20 MG capsule Take 1 capsule (20 mg total) by mouth daily. 30 capsule 1  . ondansetron (ZOFRAN) 8 MG tablet Take 1 tablet (8 mg total) by mouth 2 (two) times daily as needed for refractory nausea / vomiting. Start on day 3 after chemo. 30 tablet 1  . PARoxetine (PAXIL) 20 MG tablet Take 20 mg by mouth at bedtime.     . prednisoLONE sodium phosphate (INFLAMASE FORTE) 1 % ophthalmic solution Place 1 drop 2 (two) times daily as needed into the left eye (eye infection).     . prochlorperazine (COMPAZINE) 10 MG tablet Take 1 tablet (10 mg total) by mouth every 6 (six) hours as needed (Nausea or vomiting). 30 tablet 1  . zolpidem (AMBIEN) 5 MG tablet Take 1 tablet (5 mg total) by mouth at bedtime as needed for sleep. 30 tablet 1  . amoxicillin (AMOXIL) 875 MG tablet Take 875 mg by mouth 2  (two) times daily.    . tamoxifen (NOLVADEX) 20 MG tablet Take 1 tablet (20 mg total) by mouth daily. 30 tablet 6   No current facility-administered medications for this visit.    Facility-Administered Medications Ordered in Other Visits  Medication Dose Route Frequency Provider Last Rate Last Dose  . sodium chloride flush (NS) 0.9 % injection 10 mL  10 mL Intravenous PRN Earlie Server, MD   10 mL at 09/10/17 0845      .  PHYSICAL EXAMINATION: ECOG PERFORMANCE STATUS: 0 - Asymptomatic Vitals:   09/10/17 0909  BP: 136/85  Pulse: (!) 111  Resp: 20  Temp: 98.7 F (37.1 C)   Physical Exam  Constitutional: She is oriented to person, place, and time and well-developed, well-nourished, and in no distress. No distress.  HENT:  Head: Normocephalic and atraumatic.  Eyes: EOM are normal. Pupils are equal, round, and reactive to light. No scleral icterus.  erythematous conjunctiva,   Neck: Normal range of motion. Neck supple. No thyromegaly present.  Cardiovascular: Normal rate, regular rhythm and normal heart sounds.  No murmur heard. Pulmonary/Chest: Effort normal and breath sounds normal. No respiratory distress.  Abdominal: Soft. Bowel sounds are normal. She exhibits no distension.  Musculoskeletal: Normal range of motion. She exhibits no edema.  Lymphadenopathy:    She has no cervical adenopathy.  Neurological: She is alert and oriented to person, place, and time. No cranial nerve deficit.  Skin: Skin is dry. No erythema.  Psychiatric: Affect normal.   LABORATORY DATA:  I have reviewed the data as listed: no recent labs.  Lab Results  Component Value Date   WBC 11.4 (H) 09/02/2017   HGB 11.0 (L) 09/02/2017   HCT 33.0 (L) 09/02/2017   MCV 93.7 09/02/2017   PLT 206 09/02/2017   Recent Labs    08/20/17 0935 08/28/17 1401 09/02/17 1412  NA 138 138 138  K 3.9 4.0 3.7  CL 107 105 104  CO2 '23 26 27  '$ GLUCOSE 160* 109* 116*  BUN 22* 21* 14  CREATININE 0.65 0.64  0.68  CALCIUM  9.1 8.7* 8.8*  GFRNONAA >60 >60 >60  GFRAA >60 >60 >60  PROT 6.5 6.0* 6.1*  ALBUMIN 3.3* 3.2* 3.1*  AST '29 29 24  '$ ALT '22 24 25  '$ ALKPHOS 79 114 119  BILITOT 0.4 0.5 0.6       ASSESSMENT & PLAN:  57 yo premenopausal female with Stage 1A breast cancer, high risk mammaprint index currently on adjuvant chemotherapy with TC. Cancer Staging Malignant neoplasm of upper-inner quadrant of left breast in female, estrogen receptor positive ([place]) Staging form: Breast, AJCC 8th Edition - Clinical stage from 05/22/2017: Stage IA (cT1, cN0, cM0, G1, ER: Positive, PR: Positive, HER2: Negative) - Signed by Earlie Server, MD on 05/22/2017 - Pathologic stage from 07/09/2017: Stage IA (pT1b, pN0, cM0, G2, ER: Positive, PR: Positive, HER2: Negative) - Signed by Earlie Server, MD on 07/30/2017  1. Acute conjunctivitis of left eye, unspecified acute conjunctivitis type   2. Fatigue due to treatment   3. Malignant neoplasm of upper-inner quadrant of left breast in female, estrogen receptor positive ([place])   4. Anxiety     #Patient not willing to proceed with additional chemotherapy as she feels bad on chemotherapy treatment.  We had a lengthy discussion and I recommend she finish the planned for treatment.  Patient decided not to proceed with additional treatment at this point.  In that case, I prescribed her tamoxifen 20 mg daily, prescription sent to pharmacy.  She can start tamoxifen after her left eye infection resolves, and after finishing her antibiotics prescribed by dentist..  Side effects of tamoxifen including but not limited to blood clots, increase of endometrial cancer were discussed with patient.  She voices understanding and willing to proceed.  #Discussed with patient that her anxiety medication Paxil may interfere with patient's tamoxifen efficacy.  Patient has been taking Paxil for the past 20 years for anxiety.  She does not like Xanax as needed.  Her previous primary care physician has left practice  and she needs to be refer to establish care with new primary care physician and possibly switch to other anxiety medication.  # Dental clearance received.  Patient has ongoing dental work.  She just had a tooth extraction yesterday, and needs to have cavities filled.  According to dentist she also need to have restoration done.  Discussed with patient encouraged her to follow-up with his primary care physician and finish the required dental work before starting [place].  I schedule possible Z[place]eta session when she returns to follow-up with me in 3 weeks. #Conjunctivitis, refer to ophthalmology for further eval. #Patient's MediPort can be taken out or she can choose to keep it with port flush every 6-8 weeks.  Patient is considering but I would let me know next visit.  #Anxiety: She needs to establish care with new primary care physician and possibly switch to other anxiety medication which does not interfere with tamoxifen.  We will make the referral.  Return of visit: 3 weeks Lab/MD   Earlie Server, MD, PhD Hematology Oncology [facility] Pager- [phone] 09/10/2017

## 2017-09-10 ENCOUNTER — Inpatient Hospital Stay (HOSPITAL_BASED_OUTPATIENT_CLINIC_OR_DEPARTMENT_OTHER): Payer: Medicaid Other | Admitting: Oncology

## 2017-09-10 ENCOUNTER — Inpatient Hospital Stay: Payer: Medicaid Other

## 2017-09-10 ENCOUNTER — Encounter: Payer: Self-pay | Admitting: Oncology

## 2017-09-10 VITALS — BP 136/85 | HR 111 | Temp 98.7°F | Resp 20 | Wt 226.0 lb

## 2017-09-10 DIAGNOSIS — F419 Anxiety disorder, unspecified: Secondary | ICD-10-CM | POA: Diagnosis not present

## 2017-09-10 DIAGNOSIS — C50212 Malignant neoplasm of upper-inner quadrant of left female breast: Secondary | ICD-10-CM

## 2017-09-10 DIAGNOSIS — R530 Neoplastic (malignant) related fatigue: Secondary | ICD-10-CM | POA: Diagnosis not present

## 2017-09-10 DIAGNOSIS — Z170 Estrogen receptor positive status [ER+]: Secondary | ICD-10-CM

## 2017-09-10 DIAGNOSIS — H1032 Unspecified acute conjunctivitis, left eye: Secondary | ICD-10-CM

## 2017-09-10 DIAGNOSIS — R5383 Other fatigue: Secondary | ICD-10-CM

## 2017-09-10 DIAGNOSIS — H16002 Unspecified corneal ulcer, left eye: Secondary | ICD-10-CM | POA: Diagnosis not present

## 2017-09-10 DIAGNOSIS — Z5111 Encounter for antineoplastic chemotherapy: Secondary | ICD-10-CM | POA: Diagnosis not present

## 2017-09-10 LAB — COMPREHENSIVE METABOLIC PANEL
ALT: 22 U/L (ref 14–54)
AST: 33 U/L (ref 15–41)
Albumin: 3.1 g/dL — ABNORMAL LOW (ref 3.5–5.0)
Alkaline Phosphatase: 89 U/L (ref 38–126)
Anion gap: 11 (ref 5–15)
BUN: 14 mg/dL (ref 6–20)
CHLORIDE: 105 mmol/L (ref 101–111)
CO2: 23 mmol/L (ref 22–32)
CREATININE: 0.67 mg/dL (ref 0.44–1.00)
Calcium: 8.7 mg/dL — ABNORMAL LOW (ref 8.9–10.3)
GFR calc non Af Amer: >60 mL/min (ref >60)
Glucose, Bld: 152 mg/dL — ABNORMAL HIGH (ref 65–99)
POTASSIUM: 3.7 mmol/L (ref 3.5–5.1)
SODIUM: 139 mmol/L (ref 135–145)
Total Bilirubin: 0.4 mg/dL (ref 0.3–1.2)
Total Protein: 6.2 g/dL — ABNORMAL LOW (ref 6.5–8.1)

## 2017-09-10 LAB — CBC WITH DIFFERENTIAL/PLATELET
Basophils Absolute: 0 10*3/uL (ref 0–0.1)
Basophils Relative: 0 %
EOS ABS: 0 10*3/uL (ref 0–0.7)
Eosinophils Relative: 0 %
HEMATOCRIT: 32.8 % — AB (ref 35.0–47.0)
HEMOGLOBIN: 11.2 g/dL — AB (ref 12.0–16.0)
LYMPHS ABS: 1.3 10*3/uL (ref 1.0–3.6)
LYMPHS PCT: 29 %
MCH: 32.1 pg (ref 26.0–34.0)
MCHC: 34.1 g/dL (ref 32.0–36.0)
MCV: 94.3 fL (ref 80.0–100.0)
MONOS PCT: 13 %
Monocytes Absolute: 0.6 10*3/uL (ref 0.2–0.9)
NEUTROS PCT: 58 %
Neutro Abs: 2.6 10*3/uL (ref 1.4–6.5)
Platelets: 375 10*3/uL (ref 150–440)
RBC: 3.47 MIL/uL — ABNORMAL LOW (ref 3.80–5.20)
RDW: 18 % — ABNORMAL HIGH (ref 11.5–14.5)
WBC: 4.4 10*3/uL (ref 3.6–11.0)

## 2017-09-10 MED ORDER — TAMOXIFEN CITRATE 20 MG PO TABS: 20.0000 mg | ORAL_TABLET | Freq: Every day | ORAL | 6 refills | Status: DC

## 2017-09-10 MED ORDER — SODIUM CHLORIDE 0.9% FLUSH
10.0000 mL | INTRAVENOUS | Status: DC | PRN
  Administered 2017-09-10: 10 mL via INTRAVENOUS
  Filled 2017-09-10: qty 10

## 2017-09-10 MED ORDER — HEPARIN SOD (PORK) LOCK FLUSH 100 UNIT/ML IV SOLN
500.0000 [IU] | Freq: Once | INTRAVENOUS | Status: AC
  Administered 2017-09-10: 500 [IU] via INTRAVENOUS
  Filled 2017-09-10: qty 5

## 2017-09-17 DIAGNOSIS — H16002 Unspecified corneal ulcer, left eye: Secondary | ICD-10-CM | POA: Diagnosis not present

## 2017-09-18 ENCOUNTER — Ambulatory Visit: Payer: Medicaid Other | Admitting: Internal Medicine

## 2017-09-30 NOTE — Progress Notes (Signed)
Hematology/Oncology Follow up note [facility] Telephone:([phone] [phone]) [phone]   Patient Care Team: Patient, No Pcp Per as PCP - General (General Practice) Rico Junker, RN as Registered Nurse Theodore Demark, RN as Registered Nurse  REFERRING PROVIDER: Patient, No Pcp Per REASON FOR VISIT Follow up for treatment of  Breast cancer  HISTORY OF PRESENTING ILLNESS:  Sheena Martinez is a  57 y.o.  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 Margin is negative, no LVI. Mammoprintr revealed 10 year recurrence rate at 29% high risk group. Patient has completed MammoSite RT. Denies any hormonal replacement therapy.  Adjuvant chemotherapy with TC, finished 3 cycles.  She is perimenopausal.   # His Sjogren disease for which she has had tear duct surgery. She also reports history of PCOS with elevated testosterone level.   Genetic testing:  Genetic testing result vis [facility] showed no pathogenic sequence appearance or deletions /duplications.  Current Treatment Adjuvant TC x3. Patient declined cycle 4.   INTERVAL HISTORY Patient  presented for follow up of management of breast cancer.  She was referred to ophthamology during last visit and she was found to have cornea ulcer.  She has not started on Tamoxifen yet. She will see her new primary care physician and will be tapered off Paxil and start other anti anxiety medication.  She has had a tooth extraction 3 weeks ago and filled 4 cavities recently. She says her dentist will fax [place] clearance.   Review of Systems  Constitutional: Negative for chills and fever.  HENT: Negative for ear pain and hearing loss.   Eyes: Positive for redness. Negative for discharge.  Respiratory: Negative for cough.   Cardiovascular: Negative for chest pain and palpitations.  Gastrointestinal: Negative for diarrhea, nausea and vomiting.  Genitourinary:  Negative for dysuria and urgency.  Musculoskeletal: Negative for joint pain and myalgias.  Skin: Negative for rash.  Neurological: Negative for dizziness, tingling, sensory change and weakness.  Endo/Heme/Allergies: Negative for environmental allergies. Does not bruise/bleed easily.  Psychiatric/Behavioral: Negative for memory loss. The patient is nervous/anxious.    MEDICAL HISTORY:  Past Medical History:  Diagnosis Date  . Anxiety   . Arthritis   . Breast cancer ([place]) 05/12/2017   INVASIVE MAMMARY CARCINOMA ER/PR positive LEFT BREAST UPPER inner  QUAD  . GERD (gastroesophageal reflux disease)    OCC  . Hypothyroidism   . Sjogren's syndrome ([place])     SURGICAL HISTORY: Past Surgical History:  Procedure Laterality Date  . BREAST BIOPSY Left 05/12/2017   [place] bx/ INVASIVE MAMMARY CARCINOMA  . BREAST LUMPECTOMY WITH SENTINEL LYMPH NODE BIOPSY Left 06/10/2017   Procedure: BREAST LUMPECTOMY WITH SENTINEL LYMPH NODE BX AND NEEDLE LOCALIZATION;  Surgeon: Robert Bellow, MD;  Location: [facility] ORS;  Service: General;  Laterality: Left;  . BREAST MAMMOSITE Left 06/24/2017   Procedure: MAMMOSITE BREAST;  Surgeon: Robert Bellow, MD;  Location: [facility] ORS;  Service: General;  Laterality: Left;  . CESAREAN SECTION  1995  . COSMETIC SURGERY    . CYST EXCISION  2018   pilar cyst/ Dr Will Bonnet  . PORTACATH PLACEMENT Right 07/08/2017   Procedure: INSERTION PORT-A-CATH;  Surgeon: Robert Bellow, MD;  Location: [facility] ORS;  Service: General;  Laterality: Right;  . TONSILLECTOMY      SOCIAL HISTORY: Social History   Socioeconomic History  . Marital status: Married    Spouse name: Not on file  . Number  of children: Not on file  . Years of education: Not on file  . Highest education level: Not on file  Social Needs  . Financial resource strain: Not on file  . Food insecurity - worry: Never true  . Food insecurity - inability: Never true  . Transportation needs - medical: No  .  Transportation needs - non-medical: No  Occupational History  . Not on file  Tobacco Use  . Smoking status: Former Smoker    Years: 7.00    Types: Cigarettes    Last attempt to quit: 08/13/1979    Years since quitting: 38.1  . Smokeless tobacco: Never Used  . Tobacco comment: AGE 10-19  Substance and Sexual Activity  . Alcohol use: No  . Drug use: No  . Sexual activity: Yes  Other Topics Concern  . Not on file  Social History Narrative  . Not on file    FAMILY HISTORY: Family History  Problem Relation Age of Onset  . Breast cancer Maternal Aunt 19       currently ~65  . Diabetes Mother   . Skin cancer Mother        currently 70; TAH/BSO (age?)  . Anemia Mother   . Liver disease Father        deceased / not much info about him / alcoholic  . Lung cancer Paternal Aunt        smoker / deceased 38s  . Stomach cancer Paternal Uncle 76       deceased / deceased 76s  . Ovarian cancer Maternal Grandmother 16       deceased 40s  . Stomach cancer Paternal Grandfather 50       deceased 44s  . Cancer Maternal Uncle        unk. type; deceased 48s  . Leukemia Cousin 92       deceased 36    ALLERGIES:  is allergic to sulfamethoxazole-trimethoprim.  MEDICATIONS:  Current Outpatient Medications  Medication Sig Dispense Refill  . acetaminophen (TYLENOL) 500 MG tablet Take 1,000 mg by mouth every 8 (eight) hours as needed for mild pain or headache.    . ALPRAZolam (XANAX) 0.25 MG tablet Take 1 tablet (0.25 mg total) by mouth 2 (two) times daily as needed for anxiety. 10 tablet 0  . amoxicillin (AMOXIL) 875 MG tablet Take 875 mg by mouth 2 (two) times daily.    . Boric Acid POWD Place 1 capsule daily as needed vaginally (for pH levels).     . Ca Carbonate-Mag Hydroxide (ROLAIDS PO) Take 1 tablet as needed by mouth (indigestion).     . Carboxymethylcell-Hypromellose (GENTEAL OP) Apply 1 drop to eye 2 (two) times daily.    . chlorhexidine (PERIDEX) 0.12 % solution Use as directed 15  mLs in the mouth or throat 2 (two) times daily. 473 mL 0  . clotrimazole-betamethasone (LOTRISONE) cream Apply 1 application 2 (two) times a week topically.    Marland Kitchen dexamethasone (DECADRON) 4 MG tablet Take 2 tablets (8 mg total) by mouth 2 (two) times daily. Start the day before Taxotere. Then again the day after chemo for 3 days. 30 tablet 1  . gentamicin-prednisoLONE 0.3-1 % ophthalmic drops 1 drop 2 (two) times daily.    . hydrocortisone 1 % ointment Apply 1 application topically 2 (two) times daily. 28 g 0  . ketoconazole (NIZORAL) 2 % shampoo Apply 1 application topically 2 (two) times a week.    . lidocaine-prilocaine (EMLA) cream Apply to affected area  once 30 g 3  . loperamide (IMODIUM) 2 MG capsule Take 1 capsule (2 mg total) by mouth See admin instructions. With onset of loose stool, take 28m followed by 230mevery 2 hours until 12 hours have passed without loose bowel movement. Maximum: 16 mg/day 120 capsule 1  . meloxicam (MOBIC) 15 MG tablet Take 15 mg by mouth at bedtime.     . mupirocin ointment (BACTROBAN) 2 % Place 1 application into the nose 2 (two) times daily. 22 g 0  . omeprazole (PRILOSEC) 20 MG capsule Take 1 capsule (20 mg total) by mouth daily. 30 capsule 1  . ondansetron (ZOFRAN) 8 MG tablet Take 1 tablet (8 mg total) by mouth 2 (two) times daily as needed for refractory nausea / vomiting. Start on day 3 after chemo. 30 tablet 1  . PARoxetine (PAXIL) 20 MG tablet Take 20 mg by mouth at bedtime.     . prednisoLONE acetate (PRED FORTE) 1 % ophthalmic suspension 1 drop 4 (four) times daily    . prednisoLONE sodium phosphate (INFLAMASE FORTE) 1 % ophthalmic solution Place 1 drop 2 (two) times daily as needed into the left eye (eye infection).     . prochlorperazine (COMPAZINE) 10 MG tablet Take 1 tablet (10 mg total) by mouth every 6 (six) hours as needed (Nausea or vomiting). 30 tablet 1  . tamoxifen (NOLVADEX) 20 MG tablet Take 1 tablet (20 mg total) by mouth daily. 30 tablet 6    . zolpidem (AMBIEN) 5 MG tablet Take 1 tablet (5 mg total) by mouth at bedtime as needed for sleep. 30 tablet 1   No current facility-administered medications for this visit.    Facility-Administered Medications Ordered in Other Visits  Medication Dose Route Frequency Provider Last Rate Last Dose  . sodium chloride flush (NS) 0.9 % injection 10 mL  10 mL Intravenous PRN YuEarlie ServerMD   10 mL at 10/01/17 0803      .  PHYSICAL EXAMINATION: ECOG PERFORMANCE STATUS: 0 - Asymptomatic Vitals:   10/01/17 0841  BP: 125/76  Pulse: 79  Temp: 98 F (36.7 C)   Physical Exam  Constitutional: She is oriented to person, place, and time and well-developed, well-nourished, and in no distress. No distress.  HENT:  Head: Normocephalic and atraumatic.  Eyes: EOM are normal. Pupils are equal, round, and reactive to light. No scleral icterus.  erythematous conjunctiva,   Neck: Normal range of motion. Neck supple. No thyromegaly present.  Cardiovascular: Normal rate, regular rhythm and normal heart sounds.  No murmur heard. Pulmonary/Chest: Effort normal and breath sounds normal. No respiratory distress.  Abdominal: Soft. Bowel sounds are normal. She exhibits no distension.  Musculoskeletal: Normal range of motion. She exhibits no edema.  Lymphadenopathy:    She has no cervical adenopathy.  Neurological: She is alert and oriented to person, place, and time. No cranial nerve deficit.  Skin: Skin is dry. No erythema.  Psychiatric: Affect normal.   LABORATORY DATA:  I have reviewed the data as listed: no recent labs.  Lab Results  Component Value Date   WBC 2.8 (L) 10/01/2017   HGB 11.7 (L) 10/01/2017   HCT 34.3 (L) 10/01/2017   MCV 94.9 10/01/2017   PLT 282 10/01/2017   Recent Labs    09/02/17 1412 09/10/17 0845 10/01/17 0803  NA 138 139 138  K 3.7 3.7 3.6  CL 104 105 106  CO2 _0 GLUCOSE 116* 152* 140*  BUN 14 14 17  CREATININE 0.68 0.67 0.58  CALCIUM 8.8* 8.7* 8.8*   GFRNONAA >60 >60 >60  GFRAA >60 >60 >60  PROT 6.1* 6.2* 6.3*  ALBUMIN 3.1* 3.1* 3.3*  AST 24 33 29  ALT _0 ALKPHOS 119 89 82  BILITOT 0.6 0.4 0.7       ASSESSMENT & PLAN:  57 yo premenopausal female with Stage 1A breast cancer, high risk mammaprint index currently on adjuvant chemotherapy with TC. Cancer Staging Malignant neoplasm of upper-inner quadrant of left breast in female, estrogen receptor positive ([place]) Staging form: Breast, AJCC 8th Edition - Clinical stage from 05/22/2017: Stage IA (cT1, cN0, cM0, G1, ER: Positive, PR: Positive, HER2: Negative) - Signed by Earlie Server, MD on 05/22/2017 - Pathologic stage from 07/09/2017: Stage IA (pT1b, pN0, cM0, G2, ER: Positive, PR: Positive, HER2: Negative) - Signed by Earlie Server, MD on 07/30/2017  1. Malignant neoplasm of upper-inner quadrant of left breast in female, estrogen receptor positive ([place])   2. Acute conjunctivitis of left eye, unspecified acute conjunctivitis type   3. Anxiety   4. Cornea ulcer, left     # Advise patient to start Tamoxifen as instructed. Paxil may decrease patient's tamoxifen efficacy.  Patient has been taking Paxil for the past 20 years for anxiety.  She does not like Xanax as needed.  She will see her new PCP this week and establish care and switch to other anxiety medication.  Side effects of tamoxifen including but not limited to blood clots, increase of endometrial cancer were discussed with patient.  She voices understanding and willing to proceed.  # Dental clearance was previously received.  Patient has ongoing dental work and was not cleared. During the interval she has had tooth extracted and cavities filled. We have not received an update from her dentist yet. Will hold Zometa until we get update.  I schedule possible Zometa session when she returns to follow-up with me in 4 weeks.  # Cornea ulcer:  Continue follow up with Ophthalmology. I don't think this is ocular toxicity related to  chemotherapy.  #Patient's MediPort can be taken out or she can choose to keep it with port flush every 6-8 weeks.  Patient will have port flushed today.   # Anxiety: She needs to establish care with new primary care physician and possibly switch to other anxiety medication which does not interfere with tamoxifen.    Return of visit: 4 weeks Lab/MD /Zometa if we receive dental clearance.  Earlie Server, MD, PhD Hematology Oncology [facility] Pager- [phone] 10/01/2017

## 2017-10-01 ENCOUNTER — Inpatient Hospital Stay: Payer: Medicaid Other | Attending: Oncology | Admitting: Oncology

## 2017-10-01 ENCOUNTER — Other Ambulatory Visit: Payer: Self-pay

## 2017-10-01 ENCOUNTER — Encounter: Payer: Self-pay | Admitting: *Deleted

## 2017-10-01 ENCOUNTER — Inpatient Hospital Stay: Payer: Medicaid Other

## 2017-10-01 ENCOUNTER — Encounter: Payer: Self-pay | Admitting: Oncology

## 2017-10-01 VITALS — BP 125/76 | HR 79 | Temp 98.0°F | Wt 226.2 lb

## 2017-10-01 DIAGNOSIS — Z170 Estrogen receptor positive status [ER+]: Secondary | ICD-10-CM | POA: Diagnosis not present

## 2017-10-01 DIAGNOSIS — Z87891 Personal history of nicotine dependence: Secondary | ICD-10-CM | POA: Diagnosis not present

## 2017-10-01 DIAGNOSIS — C50212 Malignant neoplasm of upper-inner quadrant of left female breast: Secondary | ICD-10-CM | POA: Diagnosis present

## 2017-10-01 DIAGNOSIS — H16002 Unspecified corneal ulcer, left eye: Secondary | ICD-10-CM | POA: Diagnosis not present

## 2017-10-01 DIAGNOSIS — F419 Anxiety disorder, unspecified: Secondary | ICD-10-CM | POA: Insufficient documentation

## 2017-10-01 DIAGNOSIS — H16009 Unspecified corneal ulcer, unspecified eye: Secondary | ICD-10-CM | POA: Insufficient documentation

## 2017-10-01 DIAGNOSIS — H1032 Unspecified acute conjunctivitis, left eye: Secondary | ICD-10-CM

## 2017-10-01 DIAGNOSIS — Z79899 Other long term (current) drug therapy: Secondary | ICD-10-CM

## 2017-10-01 LAB — CBC WITH DIFFERENTIAL/PLATELET
Basophils Absolute: 0 10*3/uL (ref 0–0.1)
Basophils Relative: 0 %
Eosinophils Absolute: 0.1 10*3/uL (ref 0–0.7)
Eosinophils Relative: 3 %
HEMATOCRIT: 34.3 % — AB (ref 35.0–47.0)
HEMOGLOBIN: 11.7 g/dL — AB (ref 12.0–16.0)
LYMPHS PCT: 40 %
Lymphs Abs: 1.1 10*3/uL (ref 1.0–3.6)
MCH: 32.3 pg (ref 26.0–34.0)
MCHC: 34 g/dL (ref 32.0–36.0)
MCV: 94.9 fL (ref 80.0–100.0)
Monocytes Absolute: 0.3 10*3/uL (ref 0.2–0.9)
Monocytes Relative: 11 %
NEUTROS ABS: 1.3 10*3/uL — AB (ref 1.4–6.5)
Neutrophils Relative %: 46 %
Platelets: 282 10*3/uL (ref 150–440)
RBC: 3.61 MIL/uL — AB (ref 3.80–5.20)
RDW: 16.6 % — ABNORMAL HIGH (ref 11.5–14.5)
WBC: 2.8 10*3/uL — AB (ref 3.6–11.0)

## 2017-10-01 LAB — COMPREHENSIVE METABOLIC PANEL
ALBUMIN: 3.3 g/dL — AB (ref 3.5–5.0)
ALT: 18 U/L (ref 14–54)
ANION GAP: 5 (ref 5–15)
AST: 29 U/L (ref 15–41)
Alkaline Phosphatase: 82 U/L (ref 38–126)
BILIRUBIN TOTAL: 0.7 mg/dL (ref 0.3–1.2)
BUN: 17 mg/dL (ref 6–20)
CO2: 27 mmol/L (ref 22–32)
Calcium: 8.8 mg/dL — ABNORMAL LOW (ref 8.9–10.3)
Chloride: 106 mmol/L (ref 101–111)
Creatinine, Ser: 0.58 mg/dL (ref 0.44–1.00)
GFR calc Af Amer: >60 mL/min (ref >60)
Glucose, Bld: 140 mg/dL — ABNORMAL HIGH (ref 65–99)
POTASSIUM: 3.6 mmol/L (ref 3.5–5.1)
Sodium: 138 mmol/L (ref 135–145)
TOTAL PROTEIN: 6.3 g/dL — AB (ref 6.5–8.1)

## 2017-10-01 MED ORDER — HEPARIN SOD (PORK) LOCK FLUSH 100 UNIT/ML IV SOLN
500.0000 [IU] | Freq: Once | INTRAVENOUS | Status: AC
  Administered 2017-10-01: 500 [IU] via INTRAVENOUS
  Filled 2017-10-01: qty 5

## 2017-10-01 MED ORDER — SODIUM CHLORIDE 0.9% FLUSH
10.0000 mL | INTRAVENOUS | Status: DC | PRN
  Administered 2017-10-01: 10 mL via INTRAVENOUS
  Filled 2017-10-01: qty 10

## 2017-10-01 NOTE — Progress Notes (Signed)
Patient here today for follow up.   

## 2017-10-03 ENCOUNTER — Encounter: Payer: Self-pay | Admitting: Family Medicine

## 2017-10-03 ENCOUNTER — Ambulatory Visit: Payer: Medicaid Other | Admitting: Family Medicine

## 2017-10-03 VITALS — BP 122/84 | HR 95 | Temp 98.6°F | Resp 16 | Ht 60.0 in | Wt 227.3 lb

## 2017-10-03 DIAGNOSIS — R946 Abnormal results of thyroid function studies: Secondary | ICD-10-CM

## 2017-10-03 DIAGNOSIS — Z13220 Encounter for screening for lipoid disorders: Secondary | ICD-10-CM

## 2017-10-03 DIAGNOSIS — L680 Hirsutism: Secondary | ICD-10-CM

## 2017-10-03 DIAGNOSIS — Z833 Family history of diabetes mellitus: Secondary | ICD-10-CM | POA: Diagnosis not present

## 2017-10-03 DIAGNOSIS — F419 Anxiety disorder, unspecified: Secondary | ICD-10-CM | POA: Insufficient documentation

## 2017-10-03 DIAGNOSIS — C50212 Malignant neoplasm of upper-inner quadrant of left female breast: Secondary | ICD-10-CM | POA: Diagnosis not present

## 2017-10-03 DIAGNOSIS — Z7689 Persons encountering health services in other specified circumstances: Secondary | ICD-10-CM | POA: Diagnosis not present

## 2017-10-03 DIAGNOSIS — M170 Bilateral primary osteoarthritis of knee: Secondary | ICD-10-CM | POA: Insufficient documentation

## 2017-10-03 DIAGNOSIS — Z791 Long term (current) use of non-steroidal anti-inflammatories (NSAID): Secondary | ICD-10-CM | POA: Diagnosis not present

## 2017-10-03 DIAGNOSIS — E559 Vitamin D deficiency, unspecified: Secondary | ICD-10-CM

## 2017-10-03 DIAGNOSIS — H16002 Unspecified corneal ulcer, left eye: Secondary | ICD-10-CM | POA: Diagnosis not present

## 2017-10-03 DIAGNOSIS — Z170 Estrogen receptor positive status [ER+]: Secondary | ICD-10-CM

## 2017-10-03 DIAGNOSIS — Z6841 Body Mass Index (BMI) 40.0 and over, adult: Secondary | ICD-10-CM

## 2017-10-03 MED ORDER — ESCITALOPRAM OXALATE 10 MG PO TABS: ORAL_TABLET | ORAL | 1 refills | Status: DC

## 2017-10-03 MED ORDER — ESCITALOPRAM OXALATE 20 MG PO TABS: 20.0000 mg | ORAL_TABLET | Freq: Every day | ORAL | 1 refills | Status: DC

## 2017-10-03 MED ORDER — MELOXICAM 15 MG PO TABS: 15.0000 mg | ORAL_TABLET | Freq: Every day | ORAL | 0 refills | Status: DC

## 2017-10-03 NOTE — Progress Notes (Addendum)
Name: Sheena Martinez   MRN: [id number]    DOB: 1961-08-08   Date:10/03/2017       Progress Note  Subjective  Chief Complaint  Chief Complaint  Patient presents with  . Establish Care  . Medication Problem    Discuss medication    HPI  Pt presents to establish care and for the following concerns:  Bilateral Osteoarthritis of the Knee:  She is seeing [facility] for her knee pain.  She had Toradol injection in the RIGHT knee on 09/12/17 - she states this did not work, so she is going to have to wait until March for a steroid injection.  She denies weakness, numbness/tingling in BLE.  Pain occurs with overuse and certain movements.  Takes mobic daily - was prescribed by previous PCP.  Breast Cancer - LEFT BREAST: She had LEFT lumpectomy back in October 2018, had radiation and chemo therapy.  She recently completed treatment with Dr. Tasia Catchings - next follow up with Dr. Tasia Catchings is in one month; she is supposed to start zometa infusions.  She is still taking Tomoxifen - she just started this medication last night.  Pt is concerned that her Paxil will interact with the Tomoxifen.   Anxiety:  She has been on Paxil for >20 years for anxiety, no recent panic attacks.  Issues with her left eye recently have increased her anxiety levels.  She was told by Dr. Tasia Catchings that she needs to come off of her Paxil because of potential interaction with Tomoxifen.  LEFT Peripheral Ulcerative Keratitis:  Was diagnosed by [place] and was sent to [facility].  She is currently using prednisolone and vigamox drops.  She notes she is losing her eyelashes and has had some blurred vision in the LEFT eye only.  Follow up with [facility] on 10/09/2017; and Follow up with [facility] on 10/15/2017.   Abnormal Thyroid Test: She reports abnormal thyroid testing in the past - unable to locate a record of this.  She is unsure of whether level was hypo or hyper.  She denies abnormal fatigue; she does have hairloss (is s/p chemo).  We will check  today.  Hirsutism: Has history elevated testosterone and PCOS in the past, used to receive care from endocrinology for this, however she lost follow up when she lost insurance several years ago.  She would like a new referral today.  Labs: We will check several lab tests today in preparation for follow up and CPE.  Has family history of hyperlipidemia, family history of DM, has been taking daily NSAID (mobic)  Patient Active Problem List   Diagnosis Date Noted  . 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 Malignant neoplasm of upper-inner quadrant of left breast in female, estrogen receptor positive ([place]) 05/22/2017    Past Surgical History:  Procedure Laterality Date  . BREAST BIOPSY Left 05/12/2017   [place] bx/ INVASIVE MAMMARY CARCINOMA  . BREAST LUMPECTOMY WITH SENTINEL LYMPH NODE BIOPSY Left 06/10/2017   Procedure: BREAST LUMPECTOMY WITH SENTINEL LYMPH NODE BX AND NEEDLE LOCALIZATION;  Surgeon: Robert Bellow, MD;  Location: [facility] ORS;  Service: General;  Laterality: Left;  . BREAST MAMMOSITE Left 06/24/2017   Procedure: MAMMOSITE BREAST;  Surgeon: Robert Bellow, MD;  Location: [facility] ORS;  Service: General;  Laterality: Left;  . CESAREAN SECTION  1995  . COSMETIC SURGERY    . CYST EXCISION  2018   pilar cyst/ Dr Will Bonnet  . PORTACATH PLACEMENT Right 07/08/2017   Procedure: INSERTION PORT-A-CATH;  Surgeon: Robert Bellow, MD;  Location: [facility] ORS;  Service: General;  Laterality: Right;  . TONSILLECTOMY      Family History  Problem Relation Age of Onset  . Breast cancer Maternal Aunt 75       currently ~65  . Diabetes Mother   . Skin cancer Mother        currently 43; TAH/BSO (age?)  . Anemia Mother   . Liver disease Father        deceased / not much info about him / alcoholic  . Lung cancer Paternal Aunt        smoker / deceased 62s  . Stomach cancer Paternal Uncle 29       deceased / deceased 87s  . Ovarian cancer Maternal Grandmother 90       deceased 36s  .  Stomach cancer Paternal Grandfather 57       deceased 86s  . Cancer Maternal Uncle        unk. type; deceased 76s  . Leukemia Cousin 62       deceased 29    Social History   Socioeconomic History  . Marital status: Married    Spouse name: Not on file  . Number of children: Not on file  . Years of education: Not on file  . Highest education level: Not on file  Social Needs  . Financial resource strain: Not on file  . Food insecurity - worry: Never true  . Food insecurity - inability: Never true  . Transportation needs - medical: No  . Transportation needs - non-medical: No  Occupational History  . Not on file  Tobacco Use  . Smoking status: Former Smoker    Years: 7.00    Types: Cigarettes    Last attempt to quit: 08/13/1979    Years since quitting: 38.1  . Smokeless tobacco: Never Used  . Tobacco comment: AGE 45-19  Substance and Sexual Activity  . Alcohol use: No  . Drug use: No  . Sexual activity: Yes  Other Topics Concern  . Not on file  Social History Narrative  . Not on file     Current Outpatient Medications:  .  acetaminophen (TYLENOL) 500 MG tablet, Take 1,000 mg by mouth every 8 (eight) hours as needed for mild pain or headache., Disp: , Rfl:  .  Ca Carbonate-Mag Hydroxide (ROLAIDS PO), Take 1 tablet as needed by mouth (indigestion). , Disp: , Rfl:  .  Carboxymethylcell-Hypromellose (GENTEAL OP), Apply 1 drop to eye 2 (two) times daily., Disp: , Rfl:  .  gentamicin-prednisoLONE 0.3-1 % ophthalmic drops, 1 drop 2 (two) times daily., Disp: , Rfl:  .  meloxicam (MOBIC) 15 MG tablet, Take 15 mg by mouth at bedtime. , Disp: , Rfl:  .  PARoxetine (PAXIL) 20 MG tablet, Take 20 mg by mouth at bedtime. , Disp: , Rfl:  .  prednisoLONE acetate (PRED FORTE) 1 % ophthalmic suspension, 1 drop 4 (four) times daily, Disp: , Rfl:  .  prednisoLONE sodium phosphate (INFLAMASE FORTE) 1 % ophthalmic solution, Place 1 drop 2 (two) times daily as needed into the left eye (eye  infection). , Disp: , Rfl:  .  tamoxifen (NOLVADEX) 20 MG tablet, Take 1 tablet (20 mg total) by mouth daily., Disp: 30 tablet, Rfl: 6 .  ALPRAZolam (XANAX) 0.25 MG tablet, Take 1 tablet (0.25 mg total) by mouth 2 (two) times daily as needed for anxiety. (Patient not taking: Reported on 10/03/2017), Disp: 10 tablet, Rfl:  0 .  amoxicillin (AMOXIL) 875 MG tablet, Take 875 mg by mouth 2 (two) times daily., Disp: , Rfl:  .  Boric Acid POWD, Place 1 capsule daily as needed vaginally (for pH levels). , Disp: , Rfl:  .  chlorhexidine (PERIDEX) 0.12 % solution, Use as directed 15 mLs in the mouth or throat 2 (two) times daily. (Patient not taking: Reported on 10/03/2017), Disp: 473 mL, Rfl: 0 .  clotrimazole-betamethasone (LOTRISONE) cream, Apply 1 application 2 (two) times a week topically., Disp: , Rfl:  .  dexamethasone (DECADRON) 4 MG tablet, Take 2 tablets (8 mg total) by mouth 2 (two) times daily. Start the day before Taxotere. Then again the day after chemo for 3 days. (Patient not taking: Reported on 10/03/2017), Disp: 30 tablet, Rfl: 1 .  hydrocortisone 1 % ointment, Apply 1 application topically 2 (two) times daily. (Patient not taking: Reported on 10/03/2017), Disp: 28 g, Rfl: 0 .  ketoconazole (NIZORAL) 2 % shampoo, Apply 1 application topically 2 (two) times a week., Disp: , Rfl:  .  lidocaine-prilocaine (EMLA) cream, Apply to affected area once (Patient not taking: Reported on 10/03/2017), Disp: 30 g, Rfl: 3 .  loperamide (IMODIUM) 2 MG capsule, Take 1 capsule (2 mg total) by mouth See admin instructions. With onset of loose stool, take '4mg'$  followed by '2mg'$  every 2 hours until 12 hours have passed without loose bowel movement. Maximum: 16 mg/day (Patient not taking: Reported on 10/03/2017), Disp: 120 capsule, Rfl: 1 .  mupirocin ointment (BACTROBAN) 2 %, Place 1 application into the nose 2 (two) times daily. (Patient not taking: Reported on 10/03/2017), Disp: 22 g, Rfl: 0 .  omeprazole (PRILOSEC) 20 MG  capsule, Take 1 capsule (20 mg total) by mouth daily. (Patient not taking: Reported on 10/03/2017), Disp: 30 capsule, Rfl: 1 .  ondansetron (ZOFRAN) 8 MG tablet, Take 1 tablet (8 mg total) by mouth 2 (two) times daily as needed for refractory nausea / vomiting. Start on day 3 after chemo. (Patient not taking: Reported on 10/03/2017), Disp: 30 tablet, Rfl: 1 .  prochlorperazine (COMPAZINE) 10 MG tablet, Take 1 tablet (10 mg total) by mouth every 6 (six) hours as needed (Nausea or vomiting). (Patient not taking: Reported on 10/03/2017), Disp: 30 tablet, Rfl: 1 .  zolpidem (AMBIEN) 5 MG tablet, Take 1 tablet (5 mg total) by mouth at bedtime as needed for sleep., Disp: 30 tablet, Rfl: 1 No current facility-administered medications for this visit.   Facility-Administered Medications Ordered in Other Visits:  .  sodium chloride flush (NS) 0.9 % injection 10 mL, 10 mL, Intravenous, PRN, Earlie Server, MD, 10 mL at 10/01/17 0803  Allergies  Allergen Reactions  . Sulfamethoxazole-Trimethoprim Rash    Chest tightness. Bactrim    ROS Constitutional: Negative for fever or weight change.  Respiratory: Negative for cough and shortness of breath.   Cardiovascular: Negative for chest pain or palpitations.  Gastrointestinal: Negative for abdominal pain, no bowel changes.  Musculoskeletal: Negative for gait problem or joint swelling.  Skin: Negative for rash.  Neurological: Negative for dizziness or headache.  No other specific complaints in a complete review of systems (except as listed in HPI above).  Objective  Vitals:   10/03/17 1411  BP: 122/84  Pulse: 95  Resp: 16  Temp: 98.6 F (37 C)  TempSrc: Oral  SpO2: 96%  Weight: 227 lb 4.8 oz (103.1 kg)  Height: 5' (1.524 m)   Body mass index is 44.39 kg/m.  Physical Exam Constitutional: Patient  appears well-developed and well-nourished. No distress.  HENT: Head: Normocephalic and atraumatic. Eyes: LEFT conjunctiva is mildly erythematous (she reports  this is much improved after starting treatment) and EOM are normal. Pupils are equal, round, and reactive to light. No scleral icterus.  Neck: Normal range of motion. Neck supple. No JVD present. No thyromegaly present.  Cardiovascular: Normal rate, regular rhythm and normal heart sounds.  No murmur heard. No BLE edema. Pulmonary/Chest: Effort normal and breath sounds normal. No respiratory distress. Abdominal: Soft. Bowel sounds are normal, no distension. There is no tenderness. no masses Musculoskeletal: Normal range of motion, no joint effusions. No gross deformities Neurological: she is alert and oriented to person, place, and time. No cranial nerve deficit. Coordination, balance, strength, speech and gait are normal.  Skin: Skin is warm and dry. No rash noted. No erythema.  Psychiatric: Patient has a normal mood and affect. behavior is normal. Judgment and thought content normal.  Results for orders placed or performed in visit on 10/01/17 (from the past 72 hour(s))  Comprehensive metabolic panel     Status: Abnormal   Collection Time: 10/01/17  8:03 AM  Result Value Ref Range   Sodium 138 135 - 145 mmol/L   Potassium 3.6 3.5 - 5.1 mmol/L   Chloride 106 101 - 111 mmol/L   CO2 27 22 - 32 mmol/L   Glucose, Bld 140 (H) 65 - 99 mg/dL   BUN 17 6 - 20 mg/dL   Creatinine, Ser 0.58 0.44 - 1.00 mg/dL   Calcium 8.8 (L) 8.9 - 10.3 mg/dL   Total Protein 6.3 (L) 6.5 - 8.1 g/dL   Albumin 3.3 (L) 3.5 - 5.0 g/dL   AST 29 15 - 41 U/L   ALT 18 14 - 54 U/L   Alkaline Phosphatase 82 38 - 126 U/L   Total Bilirubin 0.7 0.3 - 1.2 mg/dL   GFR calc non Af Amer >60 >60 mL/min   GFR calc Af Amer >60 >60 mL/min    Comment: (NOTE) The eGFR has been calculated using the CKD EPI equation. This calculation has not been validated in all clinical situations. eGFR's persistently <60 mL/min signify possible Chronic Kidney Disease.    Anion gap 5 5 - 15    Comment: Performed at [facility], [address] [place], [place] [address]  CBC with Differential     Status: Abnormal   Collection Time: 10/01/17  8:03 AM  Result Value Ref Range   WBC 2.8 (L) 3.6 - 11.0 K/uL   RBC 3.61 (L) 3.80 - 5.20 MIL/uL   Hemoglobin 11.7 (L) 12.0 - 16.0 g/dL   HCT 34.3 (L) 35.0 - 47.0 %   MCV 94.9 80.0 - 100.0 fL   MCH 32.3 26.0 - 34.0 pg   MCHC 34.0 32.0 - 36.0 g/dL   RDW 16.6 (H) 11.5 - 14.5 %   Platelets 282 150 - 440 K/uL   Neutrophils Relative % 46 %   Neutro Abs 1.3 (L) 1.4 - 6.5 K/uL   Lymphocytes Relative 40 %   Lymphs Abs 1.1 1.0 - 3.6 K/uL   Monocytes Relative 11 %   Monocytes Absolute 0.3 0.2 - 0.9 K/uL   Eosinophils Relative 3 %   Eosinophils Absolute 0.1 0 - 0.7 K/uL   Basophils Relative 0 %   Basophils Absolute 0.0 0 - 0.1 K/uL    Comment: Performed at [facility], [address], [place],  [address]    PHQ2/9: Depression screen PHQ  2/9 08/13/2017  Decreased Interest 0  Down, Depressed, Hopeless 0  PHQ - 2 Score 0   Fall Risk: Fall Risk  08/13/2017 07/30/2017  Falls in the past year? No No   Assessment & Plan   1. Anxiety - Take 1/2 tab Paxil and 1/2 tab lexapro x7 days, then stop paxil and start taking 1 tablet lexapro daily. Pt verbalizes understanding. - escitalopram (LEXAPRO) 10 MG tablet; Take 1/2 tablet x7 days, then increase to 1 tablet once daily.  Dispense: 30 tablet; Refill: 1  2. Ulcer of left cornea - Maintain close ophthalmology follow up.  3. Bilateral primary osteoarthritis of knee - Maintain follow up with [facility]. - meloxicam (MOBIC) 15 MG tablet; Take 1 tablet (15 mg total) by mouth at bedtime.  Dispense: 30 tablet; Refill: 0  4. Malignant neoplasm of upper-inner quadrant of left breast in female, estrogen receptor positive ([place]) - Maintain follow up with Dr. Tasia Catchings  5. Hirsutism - Ambulatory referral to Endocrinology  6. Abnormal finding on thyroid function test - TSH  7. Vitamin D deficiency - VITAMIN D 25 Hydroxy (Vit-D  Deficiency, Fractures)  8. Morbid obesity with BMI of 40.0-44.9, adult (HCC) - Lipid panel - Hemoglobin A1c -Discussed importance of 150 minutes of physical activity weekly, eat two servings of fish weekly, eat one serving of tree nuts ( cashews, pistachios, pecans, almonds.Marland Kitchen) every other day, eat 6 servings of fruit/vegetables daily and drink plenty of water and avoid sweet beverages.   9. Long term (current) use of non-steroidal anti-inflammatories (nsaid) - Pt had CMP performed 10/01/2017 that shows adequate kidney function.  Discussed risk of long-term NSAID use on kidney function, BP, and risk of GI bleeding. Pt verbalizes understanding.  10. Screening for hyperlipidemia - Lipid panel  11. Family history of diabetes mellitus - Hemoglobin A1c  12. Encounter to establish care -Reviewed Health Maintenance:  Return for 2-3 Week follow up; CPE in 6-8 weeks.

## 2017-10-03 NOTE — Patient Instructions (Addendum)
Take 1/2 tablet Paxil and 1/2 Tablet Lexapro for 1 week.  Then STOP Paxil and take 1 full tablet Lexapro Daily.

## 2017-10-03 NOTE — Addendum Note (Signed)
Addended by: Hubbard Hartshorn on: 10/03/2017 04:56 PM   Modules accepted: Orders

## 2017-10-09 DIAGNOSIS — H16072 Perforated corneal ulcer, left eye: Secondary | ICD-10-CM

## 2017-10-09 HISTORY — DX: Perforated corneal ulcer, left eye: H16.072

## 2017-10-13 ENCOUNTER — Emergency Department: Payer: Medicaid Other

## 2017-10-13 ENCOUNTER — Other Ambulatory Visit: Payer: Self-pay

## 2017-10-13 ENCOUNTER — Emergency Department
Admission: EM | Admit: 2017-10-13 | Discharge: 2017-10-13 | Disposition: A | Discharge status: Home / Self Care | Payer: Medicaid Other | Attending: Emergency Medicine | Admitting: Emergency Medicine

## 2017-10-13 ENCOUNTER — Encounter: Payer: Self-pay | Admitting: Emergency Medicine

## 2017-10-13 ENCOUNTER — Telehealth: Payer: Self-pay | Admitting: Emergency Medicine

## 2017-10-13 ENCOUNTER — Telehealth: Payer: Self-pay | Admitting: Family Medicine

## 2017-10-13 DIAGNOSIS — Z853 Personal history of malignant neoplasm of breast: Secondary | ICD-10-CM | POA: Insufficient documentation

## 2017-10-13 DIAGNOSIS — Z87891 Personal history of nicotine dependence: Secondary | ICD-10-CM | POA: Insufficient documentation

## 2017-10-13 DIAGNOSIS — E039 Hypothyroidism, unspecified: Secondary | ICD-10-CM | POA: Diagnosis not present

## 2017-10-13 DIAGNOSIS — Z9889 Other specified postprocedural states: Secondary | ICD-10-CM | POA: Diagnosis not present

## 2017-10-13 DIAGNOSIS — Z79899 Other long term (current) drug therapy: Secondary | ICD-10-CM | POA: Insufficient documentation

## 2017-10-13 DIAGNOSIS — R079 Chest pain, unspecified: Secondary | ICD-10-CM | POA: Diagnosis present

## 2017-10-13 DIAGNOSIS — R0789 Other chest pain: Secondary | ICD-10-CM | POA: Diagnosis not present

## 2017-10-13 LAB — CBC
HEMATOCRIT: 40.5 % (ref 35.0–47.0)
HEMOGLOBIN: 13.7 g/dL (ref 12.0–16.0)
MCH: 31.8 pg (ref 26.0–34.0)
MCHC: 33.9 g/dL (ref 32.0–36.0)
MCV: 93.6 fL (ref 80.0–100.0)
PLATELETS: 359 10*3/uL (ref 150–440)
RBC: 4.33 MIL/uL (ref 3.80–5.20)
RDW: 14.6 % — ABNORMAL HIGH (ref 11.5–14.5)
WBC: 7.1 10*3/uL (ref 3.6–11.0)

## 2017-10-13 LAB — COMPREHENSIVE METABOLIC PANEL
ALBUMIN: 3.8 g/dL (ref 3.5–5.0)
ALK PHOS: 83 U/L (ref 38–126)
ALT: 25 U/L (ref 14–54)
AST: 27 U/L (ref 15–41)
Anion gap: 8 (ref 5–15)
BILIRUBIN TOTAL: 1 mg/dL (ref 0.3–1.2)
BUN: 24 mg/dL — ABNORMAL HIGH (ref 6–20)
CO2: 23 mmol/L (ref 22–32)
Calcium: 9.1 mg/dL (ref 8.9–10.3)
Chloride: 104 mmol/L (ref 101–111)
Creatinine, Ser: 0.61 mg/dL (ref 0.44–1.00)
GFR calc Af Amer: >60 mL/min (ref >60)
GFR calc non Af Amer: >60 mL/min (ref >60)
Glucose, Bld: 119 mg/dL — ABNORMAL HIGH (ref 65–99)
POTASSIUM: 3.6 mmol/L (ref 3.5–5.1)
Sodium: 135 mmol/L (ref 135–145)
TOTAL PROTEIN: 7.1 g/dL (ref 6.5–8.1)

## 2017-10-13 LAB — FIBRIN DERIVATIVES D-DIMER (ARMC ONLY): Fibrin derivatives D-dimer (ARMC): 647.24 ng/mL (FEU) — ABNORMAL HIGH (ref 0.00–499.00)

## 2017-10-13 LAB — TROPONIN I: Troponin I: <0.03 ng/mL (ref <0.03)

## 2017-10-13 MED ORDER — LORAZEPAM 2 MG/ML IJ SOLN
1.0000 mg | Freq: Once | INTRAMUSCULAR | Status: AC
  Administered 2017-10-13: 1 mg via INTRAVENOUS
  Filled 2017-10-13: qty 1

## 2017-10-13 MED ORDER — IOPAMIDOL (ISOVUE-370) INJECTION 76%
75.0000 mL | Freq: Once | INTRAVENOUS | Status: AC | PRN
  Administered 2017-10-13: 75 mL via INTRAVENOUS

## 2017-10-13 NOTE — Telephone Encounter (Signed)
If having chest tightness after recent surgery, she needs to go to ER for further evaluation to rule out possible clot.    Once this is ruled out, I recommend she come in for a visit to discuss her anxiety medications as we made a recent change to her daily medications.

## 2017-10-13 NOTE — Telephone Encounter (Signed)
Had emergency eye surgery and was put on prednisone. Spoke to doctor on call at [facility] and was put  Hydroxyzine 25mg . Feels anxious and chest feels tight. Cannot take medication 3 times a day due to it makes her sleepy. [phone]

## 2017-10-13 NOTE — ED Notes (Signed)
Patient transported to CT 

## 2017-10-13 NOTE — ED Triage Notes (Signed)
Chest pressure x 2 days. Did have emergency eye surgery L [facility] at [facility] 4 days ago. Began taking prednisone 3 days ago.

## 2017-10-13 NOTE — ED Notes (Signed)

## 2017-10-13 NOTE — Telephone Encounter (Signed)
Copied from [place] [id number]. Topic: Quick Communication - See Telephone Encounter >> Oct 13, 2017  4:27 PM Cleaster Corin, NT wrote: CRM for notification. See Telephone encounter for:   10/13/17. Nate calling from [place] stated that pt. Declined to make appt. With them just wanted to let Raelyn Ensign know pt. Would like to be referred to somewhere else. Nate can be reached at [phone]

## 2017-10-13 NOTE — Discharge Instructions (Signed)
Please seek medical attention for any high fevers, chest pain, shortness of breath, change in behavior, persistent vomiting, bloody stool or any other new or concerning symptoms.  

## 2017-10-13 NOTE — ED Provider Notes (Signed)
[facility] Emergency Department Provider Note    ____________________________________________   I have reviewed the triage vital signs and the nursing notes.   HISTORY  Chief Complaint Chest Pain   History limited by: Not Limited   HPI Sheena Martinez is a 57 y.o. female who presents to the emergency department today because of concern for chest pressure. It is located in the central chest. Has been present for two days. Patient notices it when she is awake, feels it gets better when she sleeps. She denies any associated shortness of breath or cough. Recently had eye surgery on the left eye for perforation. Also recently started estrogen medication for breast cancer. Talked to her physician who was concerned for possible blood clot. She denies any extremity swelling or pain.    Per medical record review patient has a history of breast cancer.  Past Medical History:  Diagnosis Date  . Anxiety   . Arthritis   . 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 GERD (gastroesophageal reflux disease)    OCC  . Hypothyroidism   . Sjogren's syndrome [facility])     Patient Active Problem List   Diagnosis Date Noted  . Anxiety 10/03/2017  . Bilateral primary osteoarthritis of knee 10/03/2017  . 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 Malignant neoplasm of upper-inner quadrant of left breast in female, estrogen receptor positive ([place]) 05/22/2017  . Abnormal finding on thyroid function test 11/24/2015  . Hirsutism 11/24/2015  . Hyperlipidemia 02/01/2014  . Impaired glucose tolerance 02/01/2014  . Vitamin D deficiency 02/01/2014  . Family history of diabetes mellitus 01/31/2014  . Adiposity 01/31/2014  . Sjogren's syndrome ([place]) 01/17/2014    Past Surgical History:  Procedure Laterality Date  . BREAST BIOPSY Left 05/12/2017   [place] bx/ INVASIVE MAMMARY CARCINOMA  . BREAST LUMPECTOMY WITH  SENTINEL LYMPH NODE BIOPSY Left 06/10/2017   Procedure: BREAST LUMPECTOMY WITH SENTINEL LYMPH NODE BX AND NEEDLE LOCALIZATION;  Surgeon: Robert Bellow, MD;  Location: [facility] ORS;  Service: General;  Laterality: Left;  . BREAST MAMMOSITE Left 06/24/2017   Procedure: MAMMOSITE BREAST;  Surgeon: Robert Bellow, MD;  Location: [facility] ORS;  Service: General;  Laterality: Left;  . CESAREAN SECTION  1995  . COSMETIC SURGERY    . CYST EXCISION  2018   pilar cyst/ Dr Will Bonnet  . PORTACATH PLACEMENT Right 07/08/2017   Procedure: INSERTION PORT-A-CATH;  Surgeon: Robert Bellow, MD;  Location: [facility] ORS;  Service: General;  Laterality: Right;  . TONSILLECTOMY      Prior to Admission medications   Medication Sig Start Date End Date Taking? Authorizing Provider  acetaminophen (TYLENOL) 500 MG tablet Take 1,000 mg by mouth every 8 (eight) hours as needed for mild pain or headache.    [provider]  Boric Acid POWD Place 1 capsule daily as needed vaginally (for pH levels).     [provider]  Ca Carbonate-Mag Hydroxide (ROLAIDS PO) Take 1 tablet as needed by mouth (indigestion).     [provider]  Carboxymethylcell-Hypromellose (GENTEAL OP) Apply 1 drop to eye 2 (two) times daily.    [provider]  clotrimazole-betamethasone (LOTRISONE) cream Apply 1 application 2 (two) times a week topically.    [provider]  escitalopram (LEXAPRO) 10 MG tablet Take 1/2 tablet x7 days, then increase to 1 tablet once daily. 10/03/17   Hubbard Hartshorn, FNP  gentamicin-prednisoLONE 0.3-1 %  ophthalmic drops 1 drop 2 (two) times daily.    [provider]  ketoconazole (NIZORAL) 2 % shampoo Apply 1 application topically 2 (two) times a week.    [provider]  lidocaine-prilocaine (EMLA) cream Apply to affected area once 07/09/17   Earlie Server, MD  meloxicam (MOBIC) 15 MG tablet Take 1 tablet (15 mg total) by mouth at bedtime. 10/03/17   Hubbard Hartshorn, FNP   PARoxetine (PAXIL) 20 MG tablet Take 20 mg by mouth at bedtime.     [provider]  prednisoLONE acetate (PRED FORTE) 1 % ophthalmic suspension 1 drop 4 (four) times daily    [provider]  prednisoLONE sodium phosphate (INFLAMASE FORTE) 1 % ophthalmic solution Place 1 drop 2 (two) times daily as needed into the left eye (eye infection).     [provider]  tamoxifen (NOLVADEX) 20 MG tablet Take 1 tablet (20 mg total) by mouth daily. 09/10/17   Earlie Server, MD  zolpidem (AMBIEN) 5 MG tablet Take 1 tablet (5 mg total) by mouth at bedtime as needed for sleep. 07/24/17   Jacquelin Hawking, NP    Allergies Sulfamethoxazole-trimethoprim  Family History  Problem Relation Age of Onset  . Breast cancer Maternal Aunt 41       currently ~65  . Diabetes Mother   . Skin cancer Mother        currently 41; TAH/BSO (age?)  . Anemia Mother   . Liver disease Father        deceased / not much info about him / alcoholic  . Lung cancer Paternal Aunt        smoker / deceased 103s  . Stomach cancer Paternal Uncle 35       deceased / deceased 32s  . Ovarian cancer Maternal Grandmother 85       deceased 23s  . Stomach cancer Paternal Grandfather 31       deceased 15s  . Cancer Maternal Uncle        unk. type; deceased 77s  . Leukemia Cousin 38       deceased 81    Social History Social History   Tobacco Use  . Smoking status: Former Smoker    Years: 7.00    Types: Cigarettes    Last attempt to quit: 08/13/1979    Years since quitting: 38.1  . Smokeless tobacco: Never Used  . Tobacco comment: AGE 44-19  Substance Use Topics  . Alcohol use: No  . Drug use: No    Review of Systems Constitutional: No fever/chills Eyes: Recent left eye surgery ENT: No sore throat. Cardiovascular: Positive for chest pressure. Respiratory: Denies shortness of breath. Gastrointestinal: No abdominal pain.  No nausea, no vomiting.  No diarrhea.   Genitourinary: Negative for  dysuria. Musculoskeletal: Negative for back pain. Skin: Negative for rash. Neurological: Negative for headaches, focal weakness or numbness.  ____________________________________________   PHYSICAL EXAM:  VITAL SIGNS: ED Triage Vitals  Enc Vitals Group     BP 10/13/17 1531 (!) 154/86     Pulse Rate 10/13/17 1531 94     Resp --      Temp 10/13/17 1531 98.7 F (37.1 C)     Temp Source 10/13/17 1531 Oral     SpO2 10/13/17 1531 97 %     Weight 10/13/17 1532 225 lb (102.1 kg)     Height 10/13/17 1532 5' (1.524 m)     Head Circumference --      Peak  Flow --      Pain Score 10/13/17 1538 10   Constitutional: Alert and oriented. Well appearing and in no distress. Eyes: Left eye with some subconjunctival hemorrhage, surgical site appropriate post op. ENT   Head: Normocephalic   Nose: No congestion/rhinnorhea.   Mouth/Throat: Mucous membranes are moist.   Neck: No stridor. Hematological/Lymphatic/Immunilogical: No cervical lymphadenopathy. Cardiovascular: Normal rate, regular rhythm.  No murmurs, rubs, or gallops. Respiratory: Normal respiratory effort without tachypnea nor retractions. Breath sounds are clear and equal bilaterally. No wheezes/rales/rhonchi. Gastrointestinal: Soft and non tender. No rebound. No guarding.  Genitourinary: Deferred Musculoskeletal: Normal range of motion in all extremities. No lower extremity edema. Neurologic:  Normal speech and language. No gross focal neurologic deficits are appreciated.  Skin:  Skin is warm, dry and intact. No rash noted. Psychiatric: Mood and affect are normal. Speech and behavior are normal. Patient exhibits appropriate insight and judgment.  ____________________________________________    LABS (pertinent positives/negatives)  Trop <0.03 CBC wnl except rdw 14.6 CMP glu 119, bun 24 otherwsie wnl D-dimer 647.24 ____________________________________________   EKG  I, Nance Pear, attending physician,  personally viewed and interpreted this EKG  EKG Time: 1528 Rate: 96 Rhythm: normal sinus rhythm Axis: left axis deviation Intervals: qtc 444 QRS: narrow ST changes: no st elevation Impression: abnormal ekg   ____________________________________________    RADIOLOGY  CT angio No pulmonary embolism, no pneumonia.  ____________________________________________   PROCEDURES  Procedures  ____________________________________________   INITIAL IMPRESSION / ASSESSMENT AND PLAN / ED COURSE  Pertinent labs & imaging results that were available during my care of the patient were reviewed by me and considered in my medical decision making (see chart for details).  Patient presented to the emergency department today because of concerns for chest pressure.  There was concern for blood clot.  A d-dimer was minimally elevated CT angiogram was performed.  This did not show any blood clots.  I do wonder if a lot of the patient's symptoms are related to medication and anxiety.  Discussed with patient importance of primary care follow-up.   ____________________________________________   FINAL CLINICAL IMPRESSION(S) / ED DIAGNOSES  Final diagnoses:  Nonspecific chest pain     Note: This dictation was prepared with Dragon dictation. Any transcriptional errors that result from this process are unintentional     Nance Pear, MD 10/13/17 [id number]

## 2017-10-13 NOTE — Telephone Encounter (Signed)
Patient notified, will go to ER

## 2017-10-14 ENCOUNTER — Ambulatory Visit: Payer: Self-pay | Admitting: *Deleted

## 2017-10-14 NOTE — Telephone Encounter (Signed)
Please advise 

## 2017-10-14 NOTE — Telephone Encounter (Signed)
Patient notified of options for [facility] or ER or we will see her tomorrow at 77

## 2017-10-14 NOTE — Telephone Encounter (Signed)
Patient stated that she would like to postpone the referral due to recent eye surgery and upcoming rheumatology referral but will like to keep the referral on file.  Patient asked to speak with Bonnita Nasuti in regards to the phone call on yesterday but when the call was transferred, Bonnita Nasuti was calling a patient back to be roomed.

## 2017-10-14 NOTE — Telephone Encounter (Signed)
Please advice on how you would like for me to proceed with this.  thanks

## 2017-10-14 NOTE — Telephone Encounter (Signed)
Documentation reviewed.  Sheena Martinez - please call patient back when able. Thank you!

## 2017-10-14 NOTE — Telephone Encounter (Signed)
Called left message for patient

## 2017-10-14 NOTE — Telephone Encounter (Signed)
If she is feeling that she is in crisis, she needs to go to [facility] walk-in clinic for evaluation.  Here are some resources to help you if you feel you are in a mental health crisis: [place] - Address: [address], [place] - Telephone: [phone]  - Hours of Operation: Sunday - Saturday - 8:00 a.m. - 8:00 p.m. - Medicaid, Medicare (Government Issued Only), BCBS, and [employer] - Pay - Crisis Management, [place], Psychiatrists on-site to provide medication management, [place], and Peer Support Care.

## 2017-10-14 NOTE — Telephone Encounter (Signed)
Please call pt and ask which endocrinologist she prefers and refer her to them.  If she does not have a preference, please refer to an alternative endo in the area. Thank you.

## 2017-10-14 NOTE — Telephone Encounter (Signed)
Pt called for chest tightness but states it is anxiety. Cardiac problems have been ruled out at the hospital yesterday. She received ativan in the hospital and she stated that that really helped her. She could relax and and the tightness went away. She denies any other cardiac symptom. She had emergency eye surgery last week and started having anxiety on Saturday morning. She has blurred vision. She was on Paxil and now on Lexapro.  Will send this to her pcp for the request for Ativan . Pt is requesting a call back . Home care advice given to her with verbal understanding.   Reason for Disposition . [1] Symptoms of anxiety or panic AND [2] has not been evaluated for this by physician  Answer Assessment - Initial Assessment Questions 1. CONCERN: "What happened that made you call today?"     Had emergency eye surgery, now has very blurry vision 2. ANXIETY SYMPTOM SCREENING: "Can you describe how you have been feeling?"  (e.g., tense, restless, panicky, anxious, keyed up, trouble sleeping, trouble concentrating)     Chest tightness 3. ONSET: "How long have you been feeling this way?"     Started Saturday morning 4. RECURRENT: "Have you felt this way before?"  If yes: "What happened that time?" "What helped these feelings go away in the past?"      Probable 20 years ago, was on paxil and now lexapro now 5. RISK OF HARM - SUICIDAL IDEATION:  "Do you ever have thoughts of hurting or killing yourself?"  (e.g., yes, no, no but preoccupation with thoughts about death)   - INTENT:  "Do you have thoughts of hurting or killing yourself right NOW?" (e.g., yes, no, N/A)   - PLAN: "Do you have a specific plan for how you would do this?" (e.g., gun, knife, overdose, no plan, N/A)     no 6. RISK OF HARM - HOMICIDAL IDEATION:  "Do you ever have thoughts of hurting or killing someone else?"  (e.g., yes, no, no but preoccupation with thoughts about death)   - INTENT:  "Do you have thoughts of hurting or  killing someone right NOW?" (e.g., yes, no, N/A)   - PLAN: "Do you have a specific plan for how you would do this?" (e.g., gun, knife, no plan, N/A)      no 7. FUNCTIONAL IMPAIRMENT: "How have things been going for you overall in your life? Have you had any more difficulties than usual doing your normal daily activities?"  (e.g., better, same, worse; self-care, school, work, interactions)     The emergency eye surgery that has changed her life activities 29. SUPPORT: "Who is with you now?" "Who do you live with?" "Do you have family or friends nearby who you can talk to?"      Alone right now but does not live alone 9. THERAPIST: "Do you have a counselor or therapist? Name?"     no 10. STRESSORS: "Has there been any new stress or recent changes in your life?"       Yes, eye surgery 11. CAFFEINE ABUSE: "Do you drink caffeinated beverages, and how much each day?" (e.g., coffee, tea, colas)       No caffeine since surgery 12. SUBSTANCE ABUSE: "Do you use any illegal drugs or alcohol?"       no 13. OTHER SYMPTOMS: "Do you have any other physical symptoms right now?" (e.g., chest pain, palpitations, difficulty breathing, fever)       Chest tightness 14. PREGNANCY: "Is there any  chance you are pregnant?" "When was your last menstrual period?"       Pre menopausal  Protocols used: ANXIETY AND PANIC ATTACK-A-AH

## 2017-10-14 NOTE — Telephone Encounter (Signed)
Spoke to patient and stated she will have to be seen, since the provider here did not prescribe the medication

## 2017-10-14 NOTE — Telephone Encounter (Signed)
Pt called back to schedule an appointment, was hoping to see the dr today, or get a Rx sent in for ativan. Pt states that med helped at the ED, hoping the dr will call in since she cannot see her today.   I was able to schedule pt for Wed at 10 am, and transferred the pt to traige to discuss sx further

## 2017-10-14 NOTE — Telephone Encounter (Signed)
If she is feeling that she is in crisis, she needs to go to [facility] walk-in clinic for evaluation.  Here are some resources to help you if you feel you are in a mental health crisis: [place] - Address: [address], [place] - Telephone: [phone]  - Hours of Operation: Sunday - Saturday - 8:00 a.m. - 8:00 p.m. - Medicaid, Medicare (Government Issued Only), BCBS, and [employer] - Pay - Crisis Management, [place], Psychiatrists on-site to provide medication management, [place], and Peer Support Care.

## 2017-10-14 NOTE — Telephone Encounter (Signed)
Left message for patient regarding appointment at 10 tomorrow and will discuss options.

## 2017-10-14 NOTE — Telephone Encounter (Signed)
[facility] called, patient went to ER for anxiety and chest pains and was cleared. Was put on Ativan which helped her out a lot. Patient stated she want the provider to call her in a refill. [facility] gave her appointment for October 15, 2017 ar 10:00. I spoke to patient and she asked for refill on Ativan, it was explained to her that we do not call that medication in unless the patient has been seen here. It was also stated that there providers have a policy here about prescribing certain types of medication. Patient asked for appointment today, but there is nothing available. Patient informed to keep her appointment tomorrow, but still is asking for a couple of Ativan to be called in today.

## 2017-10-15 ENCOUNTER — Encounter: Payer: Self-pay | Admitting: Family Medicine

## 2017-10-15 ENCOUNTER — Ambulatory Visit: Payer: Medicaid Other | Admitting: Family Medicine

## 2017-10-15 ENCOUNTER — Encounter: Payer: Self-pay | Admitting: Emergency Medicine

## 2017-10-15 ENCOUNTER — Other Ambulatory Visit: Payer: Self-pay

## 2017-10-15 ENCOUNTER — Emergency Department
Admission: EM | Admit: 2017-10-15 | Discharge: 2017-10-15 | Disposition: A | Discharge status: Home / Self Care | Payer: Medicaid Other | Attending: Emergency Medicine | Admitting: Emergency Medicine

## 2017-10-15 VITALS — BP 130/70 | HR 100 | Temp 98.4°F | Resp 16 | Ht 60.0 in | Wt 216.3 lb

## 2017-10-15 DIAGNOSIS — F419 Anxiety disorder, unspecified: Secondary | ICD-10-CM

## 2017-10-15 DIAGNOSIS — H16072 Perforated corneal ulcer, left eye: Secondary | ICD-10-CM | POA: Diagnosis not present

## 2017-10-15 DIAGNOSIS — Z87891 Personal history of nicotine dependence: Secondary | ICD-10-CM | POA: Diagnosis not present

## 2017-10-15 DIAGNOSIS — Z853 Personal history of malignant neoplasm of breast: Secondary | ICD-10-CM | POA: Insufficient documentation

## 2017-10-15 DIAGNOSIS — E039 Hypothyroidism, unspecified: Secondary | ICD-10-CM | POA: Diagnosis not present

## 2017-10-15 DIAGNOSIS — Z79899 Other long term (current) drug therapy: Secondary | ICD-10-CM | POA: Diagnosis not present

## 2017-10-15 MED ORDER — LORAZEPAM 0.5 MG PO TABS: 0.5000 mg | ORAL_TABLET | Freq: Two times a day (BID) | ORAL | 0 refills | Status: DC

## 2017-10-15 MED ORDER — LORAZEPAM 0.5 MG PO TABS
0.5000 mg | ORAL_TABLET | Freq: Once | ORAL | Status: AC
  Administered 2017-10-15: 0.5 mg via ORAL
  Filled 2017-10-15: qty 1

## 2017-10-15 MED ORDER — BUSPIRONE HCL 7.5 MG PO TABS: ORAL_TABLET | ORAL | 0 refills | Status: DC

## 2017-10-15 NOTE — ED Notes (Signed)
FN: pt states she needs something for anxiety and her PCP would not prescribe for her.

## 2017-10-15 NOTE — ED Provider Notes (Signed)
[facility] Emergency Department Provider Note  ____________________________________________  Time seen: Approximately 5:37 PM  I have reviewed the triage vital signs and the nursing notes.   HISTORY  Chief Complaint Anxiety    HPI Sheena Martinez is a 57 y.o. female with a history of anxiety presents to the emergency department with frequent and increased anxiety attacks since patient had a recent left eye surgery.  Patient is supposed to follow-up with her ophthalmologist on Friday.  Patient reports that her primary care provider is unable to prescribe controlled substances at this time.  Patient reports that Ativan works well for her.  The patient has tried deep breathing but no other alleviating measures.  Patient denies homicidal or suicidal ideation.   Past Medical History:  Diagnosis Date  . Anxiety   . Arthritis   . 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 GERD (gastroesophageal reflux disease)    OCC  . Hypothyroidism   . Sjogren's syndrome [facility])     Patient Active Problem List   Diagnosis Date Noted  . 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 Anxiety 10/03/2017  . Bilateral primary osteoarthritis of knee 10/03/2017  . 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 Malignant neoplasm of upper-inner quadrant of left breast in female, estrogen receptor positive ([place]) 05/22/2017  . Abnormal finding on thyroid function test 11/24/2015  . Hirsutism 11/24/2015  . Hyperlipidemia 02/01/2014  . Impaired glucose tolerance 02/01/2014  . Vitamin D deficiency 02/01/2014  . Family history of diabetes mellitus 01/31/2014  . Adiposity 01/31/2014  . Sjogren's syndrome ([place]) 01/17/2014    Past Surgical History:  Procedure Laterality Date  . BREAST BIOPSY Left 05/12/2017   [place] bx/ INVASIVE MAMMARY CARCINOMA  . BREAST LUMPECTOMY WITH SENTINEL LYMPH NODE BIOPSY Left 06/10/2017    Procedure: BREAST LUMPECTOMY WITH SENTINEL LYMPH NODE BX AND NEEDLE LOCALIZATION;  Surgeon: Robert Bellow, MD;  Location: [facility] ORS;  Service: General;  Laterality: Left;  . BREAST MAMMOSITE Left 06/24/2017   Procedure: MAMMOSITE BREAST;  Surgeon: Robert Bellow, MD;  Location: [facility] ORS;  Service: General;  Laterality: Left;  . CESAREAN SECTION  1995  . COSMETIC SURGERY    . CYST EXCISION  2018   pilar cyst/ Dr Will Bonnet  . PORTACATH PLACEMENT Right 07/08/2017   Procedure: INSERTION PORT-A-CATH;  Surgeon: Robert Bellow, MD;  Location: [facility] ORS;  Service: General;  Laterality: Right;  . TONSILLECTOMY      Prior to Admission medications   Medication Sig Start Date End Date Taking? Authorizing Provider  acetaminophen (TYLENOL) 500 MG tablet Take 1,000 mg by mouth every 8 (eight) hours as needed for mild pain or headache.    [provider]  busPIRone (BUSPAR) 7.5 MG tablet Start taking 1 tablet once daily for 5-7 days, then increase to 1 tablet twice daily as needed. 10/15/17   Hubbard Hartshorn, FNP  Ca Carbonate-Mag Hydroxide (ROLAIDS PO) Take 1 tablet as needed by mouth (indigestion).     [provider]  carboxymethylcellulose 1 % ophthalmic solution Place 1 drop into the left eye 4 (four) times daily.    [provider]  escitalopram (LEXAPRO) 10 MG tablet Take 1/2 tablet x7 days, then increase to 1 tablet once daily. Patient taking differently: Take 10 mg by mouth daily.  10/03/17   Hubbard Hartshorn, FNP  hydrOXYzine (ATARAX/VISTARIL) 25 MG tablet Take 50 mg by mouth 3 (three)  times daily as needed for anxiety.    [provider]  ketoconazole (NIZORAL) 2 % shampoo Apply 1 application topically 2 (two) times a week.    [provider]  lidocaine-prilocaine (EMLA) cream Apply to affected area once 07/09/17   Earlie Server, MD  LORazepam (ATIVAN) 0.5 MG tablet Take 1 tablet (0.5 mg total) by mouth 2 (two) times daily for 5 days. 10/15/17 10/20/17  Lannie Fields, PA-C  meloxicam (MOBIC) 15 MG tablet Take 1 tablet (15 mg total) by mouth at bedtime. 10/03/17   Hubbard Hartshorn, FNP  moxifloxacin (VIGAMOX) 0.5 % ophthalmic solution Place 1 drop into the left eye 4 (four) times daily.    [provider]  predniSONE (DELTASONE) 10 MG tablet Take 40 mg by mouth daily. 10/10/17   [provider]  tamoxifen (NOLVADEX) 20 MG tablet Take 1 tablet (20 mg total) by mouth daily. 09/10/17   Earlie Server, MD  zolpidem (AMBIEN) 5 MG tablet Take 1 tablet (5 mg total) by mouth at bedtime as needed for sleep. 07/24/17   Jacquelin Hawking, NP    Allergies Sulfamethoxazole-trimethoprim  Family History  Problem Relation Age of Onset  . Breast cancer Maternal Aunt 4       currently ~65  . Diabetes Mother   . Skin cancer Mother        currently 85; TAH/BSO (age?)  . Anemia Mother   . Liver disease Father        deceased / not much info about him / alcoholic  . Lung cancer Paternal Aunt        smoker / deceased 28s  . Stomach cancer Paternal Uncle 35       deceased / deceased 7s  . Ovarian cancer Maternal Grandmother 11       deceased 2s  . Stomach cancer Paternal Grandfather 72       deceased 72s  . Cancer Maternal Uncle        unk. type; deceased 46s  . Leukemia Cousin 3       deceased 55    Social History Social History   Tobacco Use  . Smoking status: Former Smoker    Years: 7.00    Types: Cigarettes    Last attempt to quit: 08/13/1979    Years since quitting: 38.2  . Smokeless tobacco: Never Used  . Tobacco comment: AGE [age]  Substance Use Topics  . Alcohol use: No  . Drug use: No     Review of Systems  Constitutional: No fever/chills Eyes: No visual changes. No discharge ENT: No upper respiratory complaints. Cardiovascular: no chest pain. Respiratory: no cough. No SOB. Gastrointestinal: No abdominal pain.  No nausea, no vomiting.  No diarrhea.  No constipation. Musculoskeletal: Negative for musculoskeletal  pain. Skin: Negative for rash, abrasions, lacerations, ecchymosis. Neurological: Negative for headaches, focal weakness or numbness. Patient denies suicidal or homicidal ideation.  ____________________________________________   PHYSICAL EXAM:  VITAL SIGNS: ED Triage Vitals  Enc Vitals Group     BP 10/15/17 1556 (!) 142/80     Pulse Rate 10/15/17 1556 93     Resp 10/15/17 1556 16     Temp 10/15/17 1556 98.7 F (37.1 C)     Temp Source 10/15/17 1556 Oral     SpO2 10/15/17 1556 95 %     Weight 10/15/17 1556 216 lb (98 kg)     Height 10/15/17 1556 5' (1.524 m)     Head Circumference --  Peak Flow --      Pain Score 10/15/17 1607 0     Pain Loc --      Pain Edu? --      Excl. in [place]? --      Constitutional: Alert and oriented. Well appearing and in no acute distress. Eyes: PERRL. EOMI. left eye has surrounding ecchymosis and subconjunctival hemorrhage. Head: Atraumatic. ENT:      Ears: TMs are pearly.      Nose: No congestion/rhinnorhea.      Mouth/Throat: Mucous membranes are moist.  Neck: No stridor.  No cervical spine tenderness to palpation. Cardiovascular: Normal rate, regular rhythm. Normal S1 and S2.  Good peripheral circulation. Respiratory: Normal respiratory effort without tachypnea or retractions. Lungs CTAB. Good air entry to the bases with no decreased or absent breath sounds. Musculoskeletal: Full range of motion to all extremities. No gross deformities appreciated. Neurologic:  Normal speech and language. No gross focal neurologic deficits are appreciated.  Skin:  Skin is warm, dry and intact. No rash noted.  ____________________________________________   LABS (all labs ordered are listed, but only abnormal results are displayed)  Labs Reviewed - No data to display ____________________________________________  EKG   ____________________________________________  RADIOLOGY   No results  found.  ____________________________________________    PROCEDURES  Procedure(s) performed:    Procedures    Medications  LORazepam (ATIVAN) tablet 0.5 mg (not administered)     ____________________________________________   INITIAL IMPRESSION / ASSESSMENT AND PLAN / ED COURSE  Pertinent labs & imaging results that were available during my care of the patient were reviewed by me and considered in my medical decision making (see chart for details).  Review of the [place] CSRS was performed in accordance of the [place] prior to dispensing any controlled drugs.     Assessment and plan Anxiety Patient presents to the emergency department with frequent anxiety attacks since patient had her recent left eye surgery.  Patient history was reviewed in the [place] drug database and patient has not had recent prescriptions for controlled anti-anxiety medications.  Patient was given a 5-day supply of Ativan.  Patient was told that should she return to the emergency department, she would not receive controlled antianxiety medications.  Patient voiced understanding.  All patient questions were answered.  Patient was discharged with Ativan.     ____________________________________________  FINAL CLINICAL IMPRESSION(S) / ED DIAGNOSES  Final diagnoses:  Anxiety      NEW MEDICATIONS STARTED DURING THIS VISIT:  ED Discharge Orders        Ordered    LORazepam (ATIVAN) 0.5 MG tablet  2 times daily     10/15/17 1734          This chart was dictated using voice recognition software/Dragon. Despite best efforts to proofread, errors can occur which can change the meaning. Any change was purely unintentional.    Lannie Fields, PA-C 10/15/17 1743    Schuyler Amor, MD 10/15/17 2053

## 2017-10-15 NOTE — ED Triage Notes (Signed)
Pt in via POV with complaints of anxiety x a few days.  Pt with recent emergency eye surgery with decreased vision to left eye and with new anxiety due to that.  Pt seen here Monday with full cardiac work up, unremarkable.  Pt seen at PCP office today but was told by the PA that she saw that she is unable to prescribe a controlled substance.  Pt is just looking for some relief until she follows up with eye surgeon on Friday.  Pt A/Ox4, vitals WDL, NAD noted at this time.

## 2017-10-15 NOTE — Patient Instructions (Addendum)
Here are some resources to help you if you feel you are in a mental health crisis:  [facility] - Call [phone]  for help - Website with more resources: GripTrip.com.pt  [facility] - Call [phone] for help. - Mobile Crisis Program available 24 hours a day, 365 days a year. - Available for anyone of any age in [place] counties.  [facility]** - Address: [address], [place]  - Telephone: [phone]  - Hours of Operation: Sunday - Saturday - 8:00 a.m. - 8:00 p.m. - Medicaid, Medicare (Government Issued Only), BCBS, and Campus Management, [place], Psychiatrists on-site to provide medication management, [place], and Peer Support Care.  [facility] - Call [phone] for help. - Mobile Crisis Program available 24 hours a day, 365 days a year. - Available for anyone of any age in [place]  - Consider listening to meditation/mindfulness on Youtube or a Podcast.  12 Ways to Curb Anxiety  ?Anxiety is normal human sensation. It is what helped our ancestors survive the pitfalls of the wilderness. Anxiety is defined as experiencing worry or nervousness about an imminent event or something with an uncertain outcome. It is a feeling experienced by most people at some point in their lives. Anxiety can be triggered by a very personal issue, such as the illness of a loved one, or an event of global proportions, such as a refugee crisis. Some of the symptoms of anxiety are:  Feeling restless.  Having a feeling of impending danger.  Increased heart rate.  Rapid breathing. Sweating.  Shaking.  Weakness or feeling tired.  Difficulty concentrating on anything except the current worry.  Insomnia.  Stomach or bowel problems. What can we do about anxiety we may be  feeling? There are many techniques to help manage stress and relax. Here are 12 ways you can reduce your anxiety almost immediately: 1. Turn off the constant feed of information. Take a social media sabbatical. Studies have shown that social media directly contributes to social anxiety.  2. Monitor your television viewing habits. Are you watching shows that are also contributing to your anxiety, such as 24-hour news stations? Try watching something else, or better yet, nothing at all. Instead, listen to music, read an inspirational book or practice a hobby. 3. Eat nutritious meals. Also, don't skip meals and keep healthful snacks on hand. Hunger and poor diet contributes to feeling anxious. 4. Sleep. Sleeping on a regular schedule for at least seven to eight hours a night will do wonders for your outlook when you are awake. 5. Exercise. Regular exercise will help rid your body of that anxious energy and help you get more restful sleep. 6. Try deep (diaphragmatic) breathing. Inhale slowly through your nose for five seconds and exhale through your mouth. 7. Practice acceptance and gratitude. When anxiety hits, accept that there are things out of your control that shouldn't be of immediate concern.  8. Seek out humor. When anxiety strikes, watch a funny video, read jokes or call a friend who makes you laugh. Laughter is healing for our bodies and releases endorphins that are calming. 9. Stay positive. Take the effort to replace negative thoughts with positive ones. Try to see a stressful situation in a positive light. Try to come up with solutions rather than dwelling on the problem. 10. Figure out what triggers your anxiety. Keep a journal and make note  of anxious moments and the events surrounding them. This will help you identify triggers you can avoid or even eliminate. 11. Talk to someone. Let a trusted friend, family member or even trained professional know that you are feeling overwhelmed and anxious.  Verbalize what you are feeling and why.  12. Volunteer. If your anxiety is triggered by a crisis on a large scale, become an advocate and work to resolve the problem that is causing you unease. Anxiety is often unwelcome and can become overwhelming. If not kept in check, it can become a disorder that could require medical treatment. However, if you take the time to care for yourself and avoid the triggers that make you anxious, you will be able to find moments of relaxation and clarity that make your life much more enjoyable.

## 2017-10-15 NOTE — ED Notes (Signed)
Pt states her for anxiety medication that her PCP would not prescribe. Pt NAD at this time. Pt denies pain.

## 2017-10-15 NOTE — Progress Notes (Signed)
Name: Sheena Martinez   MRN: [id number]    DOB: September 05, 1960   Date:10/15/2017       Progress Note  Subjective  Chief Complaint  Chief Complaint  Patient presents with  . Follow-up    ER disuss medication  . 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  She was put on oral steroid taper; returns to eye doctor in 2 days.  She experienced extreme anxiety in pre-op during this time - was given a medication to help her relax right away.  Saturday 10/11/17 she awoke with chest pressure - called the eye doctor on call who prescribed hydroxyzine TID PRN for anxiety.  She tried taking this 10/11/17, 10/12/17 without relief of her anxiety.  She called our office and was instructed to go to the ER on 10/13/17 - cardiac/respiratory issue was ruled out (Negative Troponin; elevated D-dimer with negative Chest CT Angio, unremarkable CMP and CBC).  Was given IV ativan and this "put me back in my right mind".   She was forced to d/c her paxil which she had been on for 20+ years due to medication interference with her tamoxifen. Did take a left over Ambien last night and this did allow her to sleep.   Patient Active Problem List   Diagnosis Date Noted  . 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 Anxiety 10/03/2017  . Bilateral primary osteoarthritis of knee 10/03/2017  . 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 Malignant neoplasm of upper-inner quadrant of left breast in female, estrogen receptor positive ([place]) 05/22/2017  . Abnormal finding on thyroid function test 11/24/2015  . Hirsutism 11/24/2015  . Hyperlipidemia 02/01/2014  . Impaired glucose tolerance 02/01/2014  . Vitamin D deficiency 02/01/2014  . Family history of diabetes mellitus 01/31/2014  . Adiposity 01/31/2014  .  Sjogren's syndrome ([place]) 01/17/2014    Social History   Tobacco Use  . Smoking status: Former Smoker    Years: 7.00    Types: Cigarettes    Last attempt to quit: 08/13/1979    Years since quitting: 38.2  . Smokeless tobacco: Never Used  . Tobacco comment: AGE [age]  Substance Use Topics  . Alcohol use: No     Current Outpatient Medications:  .  acetaminophen (TYLENOL) 500 MG tablet, Take 1,000 mg by mouth every 8 (eight) hours as needed for mild pain or headache., Disp: , Rfl:  .  Ca Carbonate-Mag Hydroxide (ROLAIDS PO), Take 1 tablet as needed by mouth (indigestion). , Disp: , Rfl:  .  carboxymethylcellulose 1 % ophthalmic solution, Place 1 drop into the left eye 4 (four) times daily., Disp: , Rfl:  .  escitalopram (LEXAPRO) 10 MG tablet, Take 1/2 tablet x7 days, then increase to 1 tablet once daily. (Patient taking differently: Take 10 mg by mouth daily. ), Disp: 30 tablet, Rfl: 1 .  hydrOXYzine (ATARAX/VISTARIL) 25 MG tablet, Take 50 mg by mouth 3 (three) times daily as needed for anxiety., Disp: , Rfl:  .  ketoconazole (NIZORAL) 2 % shampoo, Apply 1 application topically 2 (two) times a week., Disp: , Rfl:  .  lidocaine-prilocaine (EMLA) cream, Apply to affected area once, Disp: 30 g, Rfl: 3 .  meloxicam (MOBIC) 15 MG tablet, Take 1 tablet (15 mg  total) by mouth at bedtime., Disp: 30 tablet, Rfl: 0 .  moxifloxacin (VIGAMOX) 0.5 % ophthalmic solution, Place 1 drop into the left eye 4 (four) times daily., Disp: , Rfl:  .  predniSONE (DELTASONE) 10 MG tablet, Take 40 mg by mouth daily., Disp: , Rfl: 0 .  tamoxifen (NOLVADEX) 20 MG tablet, Take 1 tablet (20 mg total) by mouth daily., Disp: 30 tablet, Rfl: 6 .  zolpidem (AMBIEN) 5 MG tablet, Take 1 tablet (5 mg total) by mouth at bedtime as needed for sleep., Disp: 30 tablet, Rfl: 1 No current facility-administered medications for this visit.   Facility-Administered Medications Ordered in Other Visits:  .  sodium chloride flush (NS)  0.9 % injection 10 mL, 10 mL, Intravenous, PRN, Earlie Server, MD, 10 mL at 10/01/17 0803  Allergies  Allergen Reactions  . Sulfamethoxazole-Trimethoprim Rash    Chest tightness. Bactrim    ROS  Constitutional: Negative for fever or weight change.  Respiratory: Negative for cough and shortness of breath.   Cardiovascular: See HPI Gastrointestinal: Negative for abdominal pain, no bowel changes.  Musculoskeletal: Negative for gait problem or joint swelling.  Skin: Negative for rash.  Neurological: Negative for dizziness or headache.  No other specific complaints in a complete review of systems (except as listed in HPI above).   Objective  Vitals:   10/15/17 1005  BP: 130/70  Pulse: 100  Resp: 16  Temp: 98.4 F (36.9 C)  TempSrc: Oral  SpO2: 93%  Weight: 216 lb 4.8 oz (98.1 kg)  Height: 5' (1.524 m)   Body mass index is 42.24 kg/m.  Nursing Note and Vital Signs reviewed.  Physical Exam  Constitutional: Patient appears well-developed and well-nourished. Obese. No distress.  HEENT: head atraumatic, normocephalic, ecchymosis surrounding the LEFT eye s/p surgical procedure as above. Cardiovascular: Normal rate, regular rhythm, S1/S2 present.  No murmur or rub heard. No BLE edema. Pulmonary/Chest: Effort normal and breath sounds clear. No respiratory distress or retractions. Psychiatric: Patient has a normal mood and affect. behavior is appropriate. Judgment and thought content normal.  Results for orders placed or performed during the hospital encounter of 10/13/17 (from the past 72 hour(s))  CBC     Status: Abnormal   Collection Time: 10/13/17  3:43 PM  Result Value Ref Range   WBC 7.1 3.6 - 11.0 K/uL   RBC 4.33 3.80 - 5.20 MIL/uL   Hemoglobin 13.7 12.0 - 16.0 g/dL   HCT 40.5 35.0 - 47.0 %   MCV 93.6 80.0 - 100.0 fL   MCH 31.8 26.0 - 34.0 pg   MCHC 33.9 32.0 - 36.0 g/dL   RDW 14.6 (H) 11.5 - 14.5 %   Platelets 359 150 - 440 K/uL    Comment: Performed at [facility], [address] [place], [place] [address]  Troponin I     Status: None   Collection Time: 10/13/17  3:43 PM  Result Value Ref Range   Troponin I <0.03 <0.03 ng/mL    Comment: Performed at [facility], [address] [place], [place] [address]  Comprehensive metabolic panel     Status: Abnormal   Collection Time: 10/13/17  3:43 PM  Result Value Ref Range   Sodium 135 135 - 145 mmol/L   Potassium 3.6 3.5 - 5.1 mmol/L   Chloride 104 101 - 111 mmol/L   CO2 23 22 - 32 mmol/L   Glucose, Bld 119 (H) 65 - 99 mg/dL   BUN 24 (H) 6 -  20 mg/dL   Creatinine, Ser 0.61 0.44 - 1.00 mg/dL   Calcium 9.1 8.9 - 10.3 mg/dL   Total Protein 7.1 6.5 - 8.1 g/dL   Albumin 3.8 3.5 - 5.0 g/dL   AST 27 15 - 41 U/L   ALT 25 14 - 54 U/L   Alkaline Phosphatase 83 38 - 126 U/L   Total Bilirubin 1.0 0.3 - 1.2 mg/dL   GFR calc non Af Amer >60 >60 mL/min   GFR calc Af Amer >60 >60 mL/min    Comment: (NOTE) The eGFR has been calculated using the CKD EPI equation. This calculation has not been validated in all clinical situations. eGFR's persistently <60 mL/min signify possible Chronic Kidney Disease.    Anion gap 8 5 - 15    Comment: Performed at [facility], [address] [place], [place] [address]  Fibrin derivatives [facility] only)     Status: Abnormal   Collection Time: 10/13/17  5:44 PM  Result Value Ref Range   Fibrin derivatives D-dimer ([facility]) 647.24 (H) 0.00 - 499.00 ng/mL (FEU)    Comment: (NOTE) <> Exclusion of Venous Thromboembolism (VTE) - OUTPATIENT ONLY   (Emergency Department or [place])   0-499 ng/ml (FEU): With a low to intermediate pretest probability                      for VTE this test result excludes the diagnosis                      of VTE.   >499 ng/ml (FEU) : VTE not excluded; additional work up for VTE is                      required. <> Testing on Inpatients and Evaluation of Disseminated Intravascular   Coagulation (DIC) Reference  Range:   0-499 ng/ml (FEU) Performed at [facility], [address], [place], [place] [address]     Assessment & Plan  1. Anxiety - busPIRone (BUSPAR) 7.5 MG tablet; Start taking 1 tablet once daily for 5-7 days, then increase to 1 tablet twice daily as needed.  Dispense: 30 tablet; Refill: 0 - Discussed our office policy at length - we do not prescribed benzodiazepines in our clinic.  If she feels that she is in crisis, she needs to seek urgent care at [facility] behavioral for assistance.   - Discussed other ways to help manage anxiety including prayer, meditation, music, talking with friends and family, etc.  - We will trial buspar as above, continue Lexapro, follow up in 1 week. -Red flags and when to present for emergency care or RTC including fever >101.46F, chest pain, shortness of breath, new/worsening/un-resolving symptoms, SI/HI reviewed with patient at time of visit. Follow up and care instructions discussed and provided in AVS. - Face-to-face time with patient was more than 25 minutes, >50% time spent counseling and coordination of care  2. Corneal perforation of left eye - Anxiety mostly secondary to emergent surgery and ongoing issues with her corneal perforation.  She has follow up with ophthalmology on Friday 10/17/17.

## 2017-10-15 NOTE — ED Notes (Signed)
Pt verbalized understanding of discharge instructions. NAD at this time. 

## 2017-10-16 ENCOUNTER — Other Ambulatory Visit: Payer: Self-pay | Admitting: Family Medicine

## 2017-10-16 DIAGNOSIS — E559 Vitamin D deficiency, unspecified: Secondary | ICD-10-CM

## 2017-10-16 LAB — LIPID PANEL
CHOL/HDL RATIO: 4.8 (calc) (ref <5.0)
CHOLESTEROL: 201 mg/dL — AB (ref <200)
HDL: 42 mg/dL — AB (ref >50)
LDL Cholesterol (Calc): 134 mg/dL (calc) — ABNORMAL HIGH
Non-HDL Cholesterol (Calc): 159 mg/dL (calc) — ABNORMAL HIGH (ref <130)
TRIGLYCERIDES: 141 mg/dL (ref <150)

## 2017-10-16 LAB — TSH: TSH: 0.01 mIU/L — ABNORMAL LOW (ref 0.40–4.50)

## 2017-10-16 LAB — VITAMIN D 25 HYDROXY (VIT D DEFICIENCY, FRACTURES): Vit D, 25-Hydroxy: 11 ng/mL — ABNORMAL LOW (ref 30–100)

## 2017-10-16 LAB — HEMOGLOBIN A1C
Hgb A1c MFr Bld: 4.7 % of total Hgb (ref <5.7)
MEAN PLASMA GLUCOSE: 88 (calc)
eAG (mmol/L): 4.9 (calc)

## 2017-10-16 MED ORDER — VITAMIN D (ERGOCALCIFEROL) 1.25 MG (50000 UNIT) PO CAPS: 50000.0000 [IU] | ORAL_CAPSULE | ORAL | 0 refills | Status: DC

## 2017-10-16 NOTE — Telephone Encounter (Signed)
Patient made appointment for office and can in

## 2017-10-20 ENCOUNTER — Other Ambulatory Visit: Payer: Self-pay | Admitting: *Deleted

## 2017-10-20 ENCOUNTER — Inpatient Hospital Stay: Payer: Medicaid Other | Attending: Oncology | Admitting: Oncology

## 2017-10-20 ENCOUNTER — Encounter: Payer: Self-pay | Admitting: General Surgery

## 2017-10-20 ENCOUNTER — Telehealth: Payer: Self-pay | Admitting: Family Medicine

## 2017-10-20 ENCOUNTER — Encounter: Payer: Self-pay | Admitting: Oncology

## 2017-10-20 ENCOUNTER — Inpatient Hospital Stay: Payer: Medicaid Other

## 2017-10-20 VITALS — BP 127/82 | HR 94 | Temp 98.2°F | Wt 213.0 lb

## 2017-10-20 DIAGNOSIS — Z79810 Long term (current) use of selective estrogen receptor modulators (SERMs): Secondary | ICD-10-CM | POA: Diagnosis not present

## 2017-10-20 DIAGNOSIS — Z79899 Other long term (current) drug therapy: Secondary | ICD-10-CM | POA: Diagnosis not present

## 2017-10-20 DIAGNOSIS — F411 Generalized anxiety disorder: Secondary | ICD-10-CM | POA: Diagnosis not present

## 2017-10-20 DIAGNOSIS — Z87891 Personal history of nicotine dependence: Secondary | ICD-10-CM | POA: Insufficient documentation

## 2017-10-20 DIAGNOSIS — H16002 Unspecified corneal ulcer, left eye: Secondary | ICD-10-CM

## 2017-10-20 DIAGNOSIS — Z170 Estrogen receptor positive status [ER+]: Secondary | ICD-10-CM | POA: Diagnosis not present

## 2017-10-20 DIAGNOSIS — C50212 Malignant neoplasm of upper-inner quadrant of left female breast: Secondary | ICD-10-CM | POA: Diagnosis present

## 2017-10-20 LAB — URINE DRUG SCREEN, QUALITATIVE (ARMC ONLY)
Amphetamines, Ur Screen: NOT DETECTED
Barbiturates, Ur Screen: NOT DETECTED
Benzodiazepine, Ur Scrn: POSITIVE — AB
CANNABINOID 50 NG, UR ~~LOC~~: NOT DETECTED
Cocaine Metabolite,Ur ~~LOC~~: NOT DETECTED
MDMA (ECSTASY) UR SCREEN: NOT DETECTED
Methadone Scn, Ur: NOT DETECTED
Opiate, Ur Screen: NOT DETECTED
PHENCYCLIDINE (PCP) UR S: NOT DETECTED
Tricyclic, Ur Screen: NOT DETECTED

## 2017-10-20 MED ORDER — LORAZEPAM 0.5 MG PO TABS: 0.5000 mg | ORAL_TABLET | Freq: Two times a day (BID) | ORAL | 0 refills | Status: AC

## 2017-10-20 NOTE — Addendum Note (Signed)
Addended by: Earlie Server on: 10/20/2017 03:45 PM   Modules accepted: Orders

## 2017-10-20 NOTE — Progress Notes (Addendum)
Hematology/Oncology Follow up note [facility] Telephone:([phone] [phone]) [phone]   Patient Care Team: Hubbard Hartshorn, FNP as PCP - General (Family Medicine) Rico Junker, RN as Registered Nurse Theodore Demark, RN as Registered Nurse  REFERRING PROVIDER: Hubbard Hartshorn, FNP REASON FOR VISIT Follow up for treatment of  Breast cancer  HISTORY OF PRESENTING ILLNESS:  Sheena Martinez is a  57 y.o.  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 Margin is negative, no LVI. Mammoprintr revealed 10 year recurrence rate at 29% high risk group. Patient has completed MammoSite RT. Denies any hormonal replacement therapy.  Adjuvant chemotherapy with TC, finished 3 cycles.  She is perimenopausal.   # His Sjogren disease for which she has had tear duct surgery. She also reports history of PCOS with elevated testosterone level.   Genetic testing:  Genetic testing result vis [facility] showed no pathogenic sequence appearance or deletions /duplications.  Current Treatment Adjuvant TC x3. Patient declined cycle 4.   INTERVAL HISTORY Patient  presented for follow up.  Unfortunately, she developed corneal ulcer for which she has to undergo emergency ophthalmology surgery.  This is considered to be related to Sjogren's syndrome.  Patient has been started on prednisone by ophthalmologist.  Her anxiety has not been well controlled lately due to her ophthalmology problems.  Patient has been on Paxil for a long time and recently switched to Lexapro as Paxil interfere with efficacy of tamoxifen.  She feels her anxiety is not well controlled.  She was emergency room due to atypical chest pain and he was discharged with Ativan 0.5 mg twice daily, she got 5-day supply.  She called her primary care physician/NP, and was told that PCP would not prescribe benzo medication for her. Patient feels extremely anxious, and has  almost used up her 5 days supply of Ativan.  She called her ophthalmologist for anxiety medication.  Patient's ophthalmologist called me and asked if I can see the patient this week and possibly refill her antianxiety medication. Patient reports that she has tried Xanax in the past when she was getting through chemotherapy and does not like the way she feels.  Ativan has been working better for controlling her anxiety.  She wonders if she can have a refill of her Ativan at a higher dose and more frequency.  She has had a tooth extraction 3 weeks ago and filled 4 cavities recently. She says her dentist will fax [place] clearance.   Review of Systems  Constitutional: Negative for chills, fever and malaise/fatigue.  HENT: Negative for congestion, ear pain, hearing loss and nosebleeds.   Eyes: Positive for redness. Negative for discharge.  Respiratory: Negative for cough and sputum production.   Cardiovascular: Negative for chest pain, palpitations and claudication.  Gastrointestinal: Negative for diarrhea, nausea and vomiting.  Genitourinary: Negative for dysuria and urgency.  Musculoskeletal: Negative for joint pain and myalgias.  Skin: Negative for rash.  Neurological: Negative for dizziness, tingling, sensory change, focal weakness and weakness.  Endo/Heme/Allergies: Negative for environmental allergies. Does not bruise/bleed easily.  Psychiatric/Behavioral: Negative for memory loss and suicidal ideas. The patient is nervous/anxious.    MEDICAL HISTORY:  Past Medical History:  Diagnosis Date  . Anxiety   . Arthritis   . Breast cancer ([place]) 05/12/2017   INVASIVE MAMMARY CARCINOMA ER/PR positive LEFT BREAST UPPER inner  QUAD  . GERD (gastroesophageal reflux disease)    OCC  . Hypothyroidism   .  Sjogren's syndrome ([place])     SURGICAL HISTORY: Past Surgical History:  Procedure Laterality Date  . BREAST BIOPSY Left 05/12/2017   [place] bx/ INVASIVE MAMMARY CARCINOMA  . BREAST LUMPECTOMY WITH  SENTINEL LYMPH NODE BIOPSY Left 06/10/2017   Procedure: BREAST LUMPECTOMY WITH SENTINEL LYMPH NODE BX AND NEEDLE LOCALIZATION;  Surgeon: Robert Bellow, MD;  Location: [facility] ORS;  Service: General;  Laterality: Left;  . BREAST MAMMOSITE Left 06/24/2017   Procedure: MAMMOSITE BREAST;  Surgeon: Robert Bellow, MD;  Location: [facility] ORS;  Service: General;  Laterality: Left;  . CESAREAN SECTION  1995  . COSMETIC SURGERY    . CYST EXCISION  2018   pilar cyst/ Dr Will Bonnet  . PORTACATH PLACEMENT Right 07/08/2017   Procedure: INSERTION PORT-A-CATH;  Surgeon: Robert Bellow, MD;  Location: [facility] ORS;  Service: General;  Laterality: Right;  . TONSILLECTOMY      SOCIAL HISTORY: Social History   Socioeconomic History  . Marital status: Married    Spouse name: Not on file  . Number of children: Not on file  . Years of education: Not on file  . Highest education level: Not on file  Social Needs  . Financial resource strain: Not on file  . Food insecurity - worry: Never true  . Food insecurity - inability: Never true  . Transportation needs - medical: No  . Transportation needs - non-medical: No  Occupational History  . Not on file  Tobacco Use  . Smoking status: Former Smoker    Years: 7.00    Types: Cigarettes    Last attempt to quit: 08/13/1979    Years since quitting: 38.2  . Smokeless tobacco: Never Used  . Tobacco comment: AGE [age]  Substance and Sexual Activity  . Alcohol use: No  . Drug use: No  . Sexual activity: Yes  Other Topics Concern  . Not on file  Social History Narrative  . Not on file    FAMILY HISTORY: Family History  Problem Relation Age of Onset  . Breast cancer Maternal Aunt 11       currently ~65  . Diabetes Mother   . Skin cancer Mother        currently 45; TAH/BSO (age?)  . Anemia Mother   . Liver disease Father        deceased / not much info about him / alcoholic  . Lung cancer Paternal Aunt        smoker / deceased 59s  . Stomach cancer  Paternal Uncle 74       deceased / deceased 66s  . Ovarian cancer Maternal Grandmother 65       deceased 76s  . Stomach cancer Paternal Grandfather 77       deceased 10s  . Cancer Maternal Uncle        unk. type; deceased 23s  . Leukemia Cousin 31       deceased 35    ALLERGIES:  is allergic to sulfamethoxazole-trimethoprim.  MEDICATIONS:  Current Outpatient Medications  Medication Sig Dispense Refill  . acetaminophen (TYLENOL) 500 MG tablet Take 1,000 mg by mouth every 8 (eight) hours as needed for mild pain or headache.    . busPIRone (BUSPAR) 7.5 MG tablet Start taking 1 tablet once daily for 5-7 days, then increase to 1 tablet twice daily as needed. 30 tablet 0  . Ca Carbonate-Mag Hydroxide (ROLAIDS PO) Take 1 tablet as needed by mouth (indigestion).     . carboxymethylcellulose 1 % ophthalmic  solution Place 1 drop into the left eye 4 (four) times daily.    Marland Kitchen escitalopram (LEXAPRO) 10 MG tablet Take 1/2 tablet x7 days, then increase to 1 tablet once daily. (Patient taking differently: Take 10 mg by mouth daily. ) 30 tablet 1  . hydrOXYzine (ATARAX/VISTARIL) 25 MG tablet Take 50 mg by mouth 3 (three) times daily as needed for anxiety.    Marland Kitchen ketoconazole (NIZORAL) 2 % shampoo Apply 1 application topically 2 (two) times a week.    . lidocaine-prilocaine (EMLA) cream Apply to affected area once 30 g 3  . LORazepam (ATIVAN) 0.5 MG tablet Take 1 tablet (0.5 mg total) by mouth 2 (two) times daily for 5 days. 10 tablet 0  . meloxicam (MOBIC) 15 MG tablet Take 1 tablet (15 mg total) by mouth at bedtime. 30 tablet 0  . moxifloxacin (VIGAMOX) 0.5 % ophthalmic solution Place 1 drop into the left eye 4 (four) times daily.    . predniSONE (DELTASONE) 10 MG tablet Take 40 mg by mouth daily.  0  . tamoxifen (NOLVADEX) 20 MG tablet Take 1 tablet (20 mg total) by mouth daily. 30 tablet 6  . Vitamin D, Ergocalciferol, (DRISDOL) 50000 units CAPS capsule Take 1 capsule (50,000 Units total) by mouth every  7 (seven) days. Once completed, take 1000IU daily OTC 8 capsule 0  . zolpidem (AMBIEN) 5 MG tablet Take 1 tablet (5 mg total) by mouth at bedtime as needed for sleep. 30 tablet 1   No current facility-administered medications for this visit.    Facility-Administered Medications Ordered in Other Visits  Medication Dose Route Frequency Provider Last Rate Last Dose  . sodium chloride flush (NS) 0.9 % injection 10 mL  10 mL Intravenous PRN Earlie Server, MD   10 mL at 10/01/17 0803      .  PHYSICAL EXAMINATION: ECOG PERFORMANCE STATUS: 0 - Asymptomatic Vitals:   10/20/17 1353  BP: 127/82  Pulse: 94  Temp: 98.2 F (36.8 C)   Physical Exam  Constitutional: She is oriented to person, place, and time and well-developed, well-nourished, and in no distress. No distress.  HENT:  Head: Normocephalic and atraumatic.  Eyes: EOM are normal. Pupils are equal, round, and reactive to light. No scleral icterus.  erythematous conjunctiva, bruising around left eye  Neck: Normal range of motion. Neck supple. No thyromegaly present.  Cardiovascular: Normal rate, regular rhythm and normal heart sounds.  No murmur heard. Pulmonary/Chest: Effort normal and breath sounds normal. No respiratory distress.  Abdominal: Soft. Bowel sounds are normal. She exhibits no distension.  Musculoskeletal: Normal range of motion. She exhibits no edema.  Lymphadenopathy:    She has no cervical adenopathy.  Neurological: She is alert and oriented to person, place, and time. No cranial nerve deficit.  Skin: Skin is dry. No erythema.  Psychiatric: Affect normal.   LABORATORY DATA:  I have reviewed the data as listed: no recent labs.  Lab Results  Component Value Date   WBC 7.1 10/13/2017   HGB 13.7 10/13/2017   HCT 40.5 10/13/2017   MCV 93.6 10/13/2017   PLT 359 10/13/2017   Recent Labs    09/10/17 0845 10/01/17 0803 10/13/17 1543  NA 139 138 135  K 3.7 3.6 3.6  CL 105 106 104  CO2 '23 27 23  '$ GLUCOSE 152* 140*  119*  BUN 14 17 24*  CREATININE 0.67 0.58 0.61  CALCIUM 8.7* 8.8* 9.1  GFRNONAA >60 >60 >60  GFRAA >60 >60 >60  PROT  6.2* 6.3* 7.1  ALBUMIN 3.1* 3.3* 3.8  AST 33 29 27  ALT '22 18 25  '$ ALKPHOS 89 82 83  BILITOT 0.4 0.7 1.0       ASSESSMENT & PLAN:  57 yo premenopausal female with Stage 1A breast cancer, high risk mammaprint index currently on adjuvant chemotherapy with TC. Cancer Staging Malignant neoplasm of upper-inner quadrant of left breast in female, estrogen receptor positive ([place]) Staging form: Breast, AJCC 8th Edition - Clinical stage from 05/22/2017: Stage IA (cT1, cN0, cM0, G1, ER: Positive, PR: Positive, HER2: Negative) - Signed by Earlie Server, MD on 05/22/2017 - Pathologic stage from 07/09/2017: Stage IA (pT1b, pN0, cM0, G2, ER: Positive, PR: Positive, HER2: Negative) - Signed by Earlie Server, MD on 07/30/2017  1. Anxiety state   2. Malignant neoplasm of upper-inner quadrant of left breast in female, estrogen receptor positive ([place])   3. Long-term current use of tamoxifen   4. Cornea ulcer, left     # Continue Tamoxifen '20mg'$  daily.   # Exacerbation of her generalized anxiety is likely due to recent ophthalomolgy problem and current use of Prednisone.   I have called and spoke to patient's NP Raquel Sarna from [facility] clinic and she will discuss with Dr.Sowles to see if they will be willing to prescribe her Ativan for a short period of time. She will update me later today.  I will refer patient to psychiatrist who can help to monitor and adjust her anxiety medication.   # Dental clearance was previously received.  Patient has ongoing dental work and was not cleared. During the interval she has had tooth extracted and cavities filled. We have not received an update from her dentist yet.  Due to her ophthalmology problem, patient has not followed up with her dentist lately and she continues to have tooth pain. Hold Zometa.   # Cornea ulcer:  Continue follow up with Ophthalmology.    #Patient's MediPort can be taken out or she can choose to keep it with port flush every 6-8 weeks.  Last flushed on 10/01/2017   Return of visit: 4 weeks Lab/MD  Total face to face encounter time for this patient visit was 40 min. >50% of the time was  spent in counseling and coordination of care.  Addendum: Patient's primary care NP Raquel Sarna called and due to their clinic policy, she can not prescribe benzodiazepines class medication. for patient.  Given that patient is very symptomatic and extremely anxious, will give her prescription of Ativan 0.'5mg'$  BID x 5 days. Patient understands that prescription is given to help her anxiety until she gets seen by psychiatrist. She is aware that she should not drive after taking Ativan. She says she can not drive now anyway due to her eye problems.    Earlie Server, MD, PhD Hematology Oncology [facility] Pager- [phone] 10/20/2017

## 2017-10-20 NOTE — Telephone Encounter (Signed)
Received call from Dr. Tasia Catchings, discussed patient's anxiety in detail.  During Ms. Riegler's visit with Dr. Tasia Catchings today, [name] agreed to have psychiatry referral which Dr. Tasia Catchings kindly placed.    I have personally reviewed the ER notes from 10/13/17 and 10/15/17 as well as Dr. Collie Siad notes from today 10/20/17.  Her anxiety has been ongoing since corneal rupture with emergent ophthalmology surgery, being placed on prednisone, and changing her Paxil to lexapro due to interaction with tamoxifen.  I placed patient on buspar at our 10/15/2017 visit, and explained our office policy of no longer prescribing benzodiazepines during that visit.  After discussion with attending physician Dr. Steele Sizer and practice manager Miel Mock regarding our policy, I will not be prescribing ativan to the patient. Dr. Tasia Catchings is updated on this plan of care.

## 2017-10-21 ENCOUNTER — Telehealth: Payer: Self-pay | Admitting: *Deleted

## 2017-10-21 ENCOUNTER — Encounter: Payer: Self-pay | Admitting: *Deleted

## 2017-10-21 DIAGNOSIS — F411 Generalized anxiety disorder: Secondary | ICD-10-CM

## 2017-10-21 NOTE — Telephone Encounter (Signed)
Spoke to Dr Waylan Boga office about psych referral, they do not accept pt's insurance, referral needs to be sent to Dr Shea Evans instead.

## 2017-10-22 ENCOUNTER — Ambulatory Visit: Payer: Medicaid Other | Admitting: Family Medicine

## 2017-10-23 ENCOUNTER — Encounter: Payer: Self-pay | Admitting: Family Medicine

## 2017-10-23 ENCOUNTER — Ambulatory Visit: Payer: Medicaid Other | Admitting: Family Medicine

## 2017-10-23 ENCOUNTER — Telehealth: Payer: Self-pay | Admitting: Family Medicine

## 2017-10-23 VITALS — BP 120/80 | HR 100 | Temp 98.3°F | Resp 16 | Ht 60.0 in | Wt 213.4 lb

## 2017-10-23 DIAGNOSIS — R946 Abnormal results of thyroid function studies: Secondary | ICD-10-CM | POA: Diagnosis not present

## 2017-10-23 DIAGNOSIS — E559 Vitamin D deficiency, unspecified: Secondary | ICD-10-CM

## 2017-10-23 DIAGNOSIS — H16002 Unspecified corneal ulcer, left eye: Secondary | ICD-10-CM

## 2017-10-23 DIAGNOSIS — H16072 Perforated corneal ulcer, left eye: Secondary | ICD-10-CM

## 2017-10-23 DIAGNOSIS — Z6841 Body Mass Index (BMI) 40.0 and over, adult: Secondary | ICD-10-CM

## 2017-10-23 DIAGNOSIS — Z170 Estrogen receptor positive status [ER+]: Secondary | ICD-10-CM

## 2017-10-23 DIAGNOSIS — F419 Anxiety disorder, unspecified: Secondary | ICD-10-CM | POA: Diagnosis not present

## 2017-10-23 DIAGNOSIS — E785 Hyperlipidemia, unspecified: Secondary | ICD-10-CM

## 2017-10-23 DIAGNOSIS — C50212 Malignant neoplasm of upper-inner quadrant of left female breast: Secondary | ICD-10-CM

## 2017-10-23 DIAGNOSIS — M170 Bilateral primary osteoarthritis of knee: Secondary | ICD-10-CM | POA: Diagnosis not present

## 2017-10-23 DIAGNOSIS — L680 Hirsutism: Secondary | ICD-10-CM | POA: Diagnosis not present

## 2017-10-23 NOTE — Progress Notes (Signed)
Name: Sheena Martinez   MRN: [id number]    DOB: 10-12-1960   Date:10/23/2017       Progress Note  Subjective  Chief Complaint  Chief Complaint  Patient presents with  . Follow-up    3 week recheck, seen in ER    HPI  Bilateral Osteoarthritis of the Knee:  She is seeing [facility] for her knee pain.  She had Toradol injection in the RIGHT knee on 09/12/17 - she states this did not work, so she is going to have to wait until March for a steroid injection - however, she has not followed up because she has not had pain in the last few weeks.  She denies weakness, numbness/tingling in BLE.  Pain occurs with overuse and certain movements.  Takes mobic daily - was prescribed by previous PCP.   Breast Cancer - LEFT BREAST:  Followed up with Dr. Tasia Catchings this week, doing well on Tamoxifen - has been compliant witih medication.  She had LEFT lumpectomy in October 2018, had radiation and chemo therapy.  Next follow up with Dr. Tasia Catchings is in one month 11/18/17; she is supposed to start zometa infusions soon, but is not sure when.  Anxiety:  She had been on Paxil for >20 years for anxiety, we had to switch to lexapro due to interference with Tamoxifen.  Issues with her left eye recently have increased her anxiety levels significantly - see below. Course of Care since last visit: - Went to ER 10/15/17 - received 5 days of BID Ativan - Saw Ophthalmology 10/16/17, who called Dr. Tasia Catchings (oncology) to request she write ativan for anxiety.  I received call from Dr. Tasia Catchings and we discussed care options - Dr. Tasia Catchings provided temporary supply of Ativan until pt was able to get in with Psychiatry.  - Pt called several locations without ability to schedule due to insurance, finally found [facility] in [place] that was able to do her intake yesterday. Martin Majestic to [facility] in [place] this morning to meet with a counselor and schedule with psychiatry; sees a psychiatrist at 1:15.  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 Taking 10mg  Oral prednisone, and vigamox drops; no longer using prednisolone drops.  Abnormal Thyroid Test: She reports abnormal thyroid testing in the past ; TSH on 10/15/17 was 0.01 - already has referral to endocrine for hirsutism, we will change referral to include hyperthyroid. She denies abnormal fatigue; she does have hairloss (is s/p chemo), denies palpitations, chest pain, shortness of breath, or diaphoresis; endorses ongoing anxiety as above.   Hirsutism: Has history elevated testosterone and PCOS in the past, used to receive care from endocrinology for this, however she lost follow up when she lost insurance several years ago.   Referral to endocrine is placed - she received call but this was while she was undergoing emergency eye surgery and told them to call back at a later date to schedule  Vit. D Deficiency: found on lab testing.  Currently taking once weekly Rx strength for 8weeks, then will switch to daily OTC supplementation.  Elevated LDL: ASCVD 2.9% risk; no need for statin at this time, lifestyle modifications discussed in detail.  Will continue to monitor periodically.  Obesity: She has lost about 13lbs since 10/03/17, says she has had a lack of appetite secondary to significant anxiety. She does state  that she wants to start eating healthier and try to exercise a bit once her anxiety improves and she is feeling up to it.  We discussed lifestyle modifications in detail.  Labs: Recent labs drawn on 10/15/17 are reviewed/discussed above if abnormal.  A1C was normal - no DM. CMP on 10/13/17 showed slightly elevated BUN and glucose, but was otherwise WNL.    Patient Active Problem List   Diagnosis Date Noted  . 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 Anxiety 10/03/2017  . Bilateral primary osteoarthritis of knee 10/03/2017  . Peripheral  ulcerative keratitis 10/01/2017  . Malignant neoplasm of upper-inner quadrant of left breast in female, estrogen receptor positive ([place]) 05/22/2017  . Abnormal finding on thyroid function test 11/24/2015  . Hirsutism 11/24/2015  . Hyperlipidemia 02/01/2014  . Impaired glucose tolerance 02/01/2014  . Vitamin D deficiency 02/01/2014  . Family history of diabetes mellitus 01/31/2014  . Adiposity 01/31/2014  . Sjogren's syndrome ([place]) 01/17/2014    Past Surgical History:  Procedure Laterality Date  . BREAST BIOPSY Left 05/12/2017   [place] bx/ INVASIVE MAMMARY CARCINOMA  . BREAST LUMPECTOMY WITH SENTINEL LYMPH NODE BIOPSY Left 06/10/2017   Procedure: BREAST LUMPECTOMY WITH SENTINEL LYMPH NODE BX AND NEEDLE LOCALIZATION;  Surgeon: Robert Bellow, MD;  Location: [facility] ORS;  Service: General;  Laterality: Left;  . BREAST MAMMOSITE Left 06/24/2017   Procedure: MAMMOSITE BREAST;  Surgeon: Robert Bellow, MD;  Location: [facility] ORS;  Service: General;  Laterality: Left;  . CESAREAN SECTION  1995  . COSMETIC SURGERY    . CYST EXCISION  2018   pilar cyst/ Dr Will Bonnet  . PORTACATH PLACEMENT Right 07/08/2017   Procedure: INSERTION PORT-A-CATH;  Surgeon: Robert Bellow, MD;  Location: [facility] ORS;  Service: General;  Laterality: Right;  . TONSILLECTOMY      Family History  Problem Relation Age of Onset  . Breast cancer Maternal Aunt 69       currently ~65  . Diabetes Mother   . Skin cancer Mother        currently 64; TAH/BSO (age?)  . Anemia Mother   . Liver disease Father        deceased / not much info about him / alcoholic  . Lung cancer Paternal Aunt        smoker / deceased 28s  . Stomach cancer Paternal Uncle 63       deceased / deceased 58s  . Ovarian cancer Maternal Grandmother 34       deceased 22s  . Stomach cancer Paternal Grandfather 92       deceased 23s  . Cancer Maternal Uncle        unk. type; deceased 10s  . Leukemia Cousin 2       deceased 74    Social History    Socioeconomic History  . Marital status: Married    Spouse name: Not on file  . Number of children: Not on file  . Years of education: Not on file  . Highest education level: Not on file  Social Needs  . Financial resource strain: Not on file  . Food insecurity - worry: Never true  . Food insecurity - inability: Never true  . Transportation needs - medical: No  . Transportation needs - non-medical: No  Occupational History  . Not on file  Tobacco Use  . Smoking status: Former Smoker    Years: 7.00    Types: Cigarettes    Last attempt to quit:  08/13/1979    Years since quitting: 38.2  . Smokeless tobacco: Never Used  . Tobacco comment: AGE 51-19  Substance and Sexual Activity  . Alcohol use: No  . Drug use: No  . Sexual activity: Yes  Other Topics Concern  . Not on file  Social History Narrative  . Not on file     Current Outpatient Medications:  .  acetaminophen (TYLENOL) 500 MG tablet, Take 1,000 mg by mouth every 8 (eight) hours as needed for mild pain or headache., Disp: , Rfl:  .  Ca Carbonate-Mag Hydroxide (ROLAIDS PO), Take 1 tablet as needed by mouth (indigestion). , Disp: , Rfl:  .  carboxymethylcellulose 1 % ophthalmic solution, Place 1 drop into the left eye 4 (four) times daily., Disp: , Rfl:  .  escitalopram (LEXAPRO) 10 MG tablet, Take 1/2 tablet x7 days, then increase to 1 tablet once daily. (Patient taking differently: Take 10 mg by mouth daily. ), Disp: 30 tablet, Rfl: 1 .  ketoconazole (NIZORAL) 2 % shampoo, Apply 1 application topically 2 (two) times a week., Disp: , Rfl:  .  lidocaine-prilocaine (EMLA) cream, Apply to affected area once, Disp: 30 g, Rfl: 3 .  LORazepam (ATIVAN) 0.5 MG tablet, Take 1 tablet (0.5 mg total) by mouth 2 (two) times daily for 5 days., Disp: 10 tablet, Rfl: 0 .  meloxicam (MOBIC) 15 MG tablet, Take 1 tablet (15 mg total) by mouth at bedtime., Disp: 30 tablet, Rfl: 0 .  moxifloxacin (VIGAMOX) 0.5 % ophthalmic solution, Place 1  drop into the left eye 4 (four) times daily., Disp: , Rfl:  .  predniSONE (DELTASONE) 10 MG tablet, Take 40 mg by mouth daily., Disp: , Rfl: 0 .  tamoxifen (NOLVADEX) 20 MG tablet, Take 1 tablet (20 mg total) by mouth daily., Disp: 30 tablet, Rfl: 6 .  Vitamin D, Ergocalciferol, (DRISDOL) 50000 units CAPS capsule, Take 1 capsule (50,000 Units total) by mouth every 7 (seven) days. Once completed, take 1000IU daily OTC, Disp: 8 capsule, Rfl: 0 .  zolpidem (AMBIEN) 5 MG tablet, Take 1 tablet (5 mg total) by mouth at bedtime as needed for sleep., Disp: 30 tablet, Rfl: 1 No current facility-administered medications for this visit.   Facility-Administered Medications Ordered in Other Visits:  .  sodium chloride flush (NS) 0.9 % injection 10 mL, 10 mL, Intravenous, PRN, Earlie Server, MD, 10 mL at 10/01/17 0803  Allergies  Allergen Reactions  . Sulfamethoxazole-Trimethoprim Rash    Chest tightness. Bactrim    ROS Constitutional: Negative for fever or weight change.  Respiratory: Negative for cough and shortness of breath.   Cardiovascular: Negative for chest pain or palpitations.  Gastrointestinal: Negative for abdominal pain, no bowel changes.  Musculoskeletal: Negative for gait problem or joint swelling.  Skin: Negative for rash.  Neurological: Negative for dizziness or headache.  No other specific complaints in a complete review of systems (except as listed in HPI above).  Objective  Vitals:   10/23/17 1050  BP: 120/80  Pulse: 100  Resp: 16  Temp: 98.3 F (36.8 C)  TempSrc: Oral  SpO2: 96%  Weight: 213 lb 6.4 oz (96.8 kg)  Height: 5' (1.524 m)   Body mass index is 41.68 kg/m.  Physical Exam Constitutional: Patient appears well-developed and well-nourished. No distress.  HENT: Head: Normocephalic and atraumatic. Ears: B TMs ok, no erythema or effusion; Nose: Nose normal. Mouth/Throat: Oropharynx is clear and moist. No oropharyngeal exudate.  Eyes: Conjunctivae and EOM are normal.  Pupils  are equal, round, and reactive to light. No scleral icterus.  Neck: Normal range of motion. Neck supple. No JVD present. No thyromegaly palpable, though adiposity may pose a hindrance during examination  Cardiovascular: Normal rate, regular rhythm and normal heart sounds.  No murmur heard. No BLE edema. Pulmonary/Chest: Effort normal and breath sounds normal. No respiratory distress. Abdominal: Soft. Bowel sounds are normal, no distension. There is no tenderness. no masses Musculoskeletal: Normal range of motion, no joint effusions. No gross deformities Neurological: she is alert and oriented to person, place, and time. No cranial nerve deficit. Coordination, balance, strength, speech and gait are normal.  Skin: Skin is warm and dry. No rash noted. No erythema. Ecchymotic area around LEFT eye s/p corneal rupture appears to be healing appropriately Psychiatric: Patient has a normal mood and affect - she appears significantly less anxious today during our conversation. behavior is normal. Judgment and thought content normal.   Results for orders placed or performed in visit on 10/20/17 (from the past 72 hour(s))  Urine Drug Screen, [facility])     Status: Abnormal   Collection Time: 10/20/17  1:45 PM  Result Value Ref Range   Tricyclic, Ur Screen NONE DETECTED NONE DETECTED   Amphetamines, Ur Screen NONE DETECTED NONE DETECTED   MDMA (Ecstasy)Ur Screen NONE DETECTED NONE DETECTED   Cocaine Metabolite,Ur [place] NONE DETECTED NONE DETECTED   Opiate, Ur Screen NONE DETECTED NONE DETECTED   Phencyclidine (PCP) Ur S NONE DETECTED NONE DETECTED   Cannabinoid 50 Ng, Ur [place] NONE DETECTED NONE DETECTED   Barbiturates, Ur Screen NONE DETECTED NONE DETECTED   Benzodiazepine, Ur Scrn POSITIVE (A) NONE DETECTED   Methadone Scn, Ur NONE DETECTED NONE DETECTED    Comment: (NOTE) Tricyclics + metabolites, urine    Cutoff 1000 ng/mL Amphetamines + metabolites, urine  Cutoff 1000 ng/mL MDMA  (Ecstasy), urine              Cutoff 500 ng/mL Cocaine Metabolite, urine          Cutoff 300 ng/mL Opiate + metabolites, urine        Cutoff 300 ng/mL Phencyclidine (PCP), urine         Cutoff 25 ng/mL Cannabinoid, urine                 Cutoff 50 ng/mL Barbiturates + metabolites, urine  Cutoff 200 ng/mL Benzodiazepine, urine              Cutoff 200 ng/mL Methadone, urine                   Cutoff 300 ng/mL The urine drug screen provides only a preliminary, unconfirmed analytical test result and should not be used for non-medical purposes. Clinical consideration and professional judgment should be applied to any positive drug screen result due to possible interfering substances. A more specific alternate chemical method must be used in order to obtain a confirmed analytical result. Gas chromatography / mass spectrometry (GC/MS) is the preferred confirmat ory method. Performed at [facility], [address] [place], [place] [address]     Assessment & Plan  1. Anxiety - She never started buspar.  Taking Ativan BID. Taking Lexapro daily.  - Follow up with [facility] psychiatry in [place], will defer to them for any further psychiatric care.  2. Bilateral primary osteoarthritis of knee - Stable, may continue meloxicam.  3. Malignant neoplasm of upper-inner quadrant of left breast in female, estrogen receptor positive ([place]) - Keep oncology  follow up appts with Dr. Tasia Catchings  4. Ulcer of left cornea - Continue ophthalmology follow-ups as scheduled.   5. Corneal perforation of left eye - Continue ophthalmology follow-ups as scheduled.   6. Abnormal finding on thyroid function test - Message sent to referrals coordinator to change endocrinology referral to add abnormal thyroid test.  7. Hirsutism - Awaiting endocrinology appt - she will call once above changes are made to schedule.  8. Hyperlipidemia, unspecified hyperlipidemia type - Lifestyle modifications discussed, ASCVD  risk is 2.9% at this time, and statin therapy is not indicated.  9. Vitamin D deficiency - Taking once weekly Vitamin D supplement, will complete 8 week course and start with daily OTC supplementation afterwards.  10. Morbid obesity with BMI of 40.0-44.9, adult ([place]) - Discussed lifestyle modifications - Discussed with adolescent  and caregiver the importance of limiting screen time to no more than 2 hours per day, exercise daily for at least 2 hours, eat 6 servings of fruit and vegetables daily, eat tree nuts ( pistachios, pecans , almonds...) one serving every other day, eat fish twice weekly. Read daily. Get involved in school. Have responsibilities  at home. To avoid STI's, practice abstinence, if unable use condoms and stick with one partner.  Discussed importance of contraception if sexually active to avoid unwanted pregnancy.    - Pt has been overwhelmed recently with ophthalmology and psychiatric issues, she also has frequent follow up with oncology and a new referral to endocrinology.  Due to this, we will schedule a CPE when pt feels it is more convenient for her in about 2-3 months.

## 2017-10-23 NOTE — Telephone Encounter (Signed)
Please add hyperthyroidism on to endocrinology referral. Pt had emergency eye surgery when they called to schedule and was unable to schedule at that time, please provide telephone number of endocrine office to patient so that she may call back to schedule. Thank you!

## 2017-10-23 NOTE — Telephone Encounter (Signed)
Referral has been resent to [facility] endocrinology.

## 2017-10-24 DIAGNOSIS — H16002 Unspecified corneal ulcer, left eye: Secondary | ICD-10-CM | POA: Diagnosis not present

## 2017-10-27 ENCOUNTER — Encounter: Payer: Self-pay | Admitting: Family Medicine

## 2017-10-27 ENCOUNTER — Telehealth: Payer: Self-pay | Admitting: Family Medicine

## 2017-10-27 DIAGNOSIS — M170 Bilateral primary osteoarthritis of knee: Secondary | ICD-10-CM

## 2017-10-27 NOTE — Telephone Encounter (Signed)
Copied from [place]. Topic: Quick Communication - Rx Refill/Question >> Oct 27, 2017  2:53 PM Oliver Pila B wrote: Reason for CRM:  escitalopram (LEXAPRO) 10 MG tablet [[id number]], meloxicam (MOBIC) 15 MG tablet [[id number]]  Pt called about these 2 medications but did not specify what she wanted to discuss, but pt would like to talk w/ Bonnita Nasuti about these medications or an available nurse who can help

## 2017-10-28 MED ORDER — MELOXICAM 15 MG PO TABS: 15.0000 mg | ORAL_TABLET | Freq: Every day | ORAL | 2 refills | Status: DC

## 2017-10-28 NOTE — Telephone Encounter (Signed)
Saw psychiatrist - pharmacy noted possible reaction between meloxicam and lexapro which the psychiatrist approved. She wanted to make me aware and to find out what the possible interaction is - I advised risk of GI bleed is slightly increased - she should take Meloxicam with food, monitor for blood in stool, abdominal pain, symptoms of reflux. Kidney function is reviewed and is adequate to continue meloxicam as this has been a long-term medication that she takes for her arthritis.

## 2017-10-28 NOTE — Telephone Encounter (Signed)
Patient called and she says she already talked to Dr. Uvaldo Rising this morning.

## 2017-10-29 ENCOUNTER — Other Ambulatory Visit: Payer: Medicaid Other

## 2017-10-29 ENCOUNTER — Ambulatory Visit: Payer: Medicaid Other | Admitting: Oncology

## 2017-10-29 ENCOUNTER — Ambulatory Visit: Payer: Medicaid Other

## 2017-11-10 HISTORY — PX: CORNEAL TRANSPLANT: SHX108

## 2017-11-12 ENCOUNTER — Other Ambulatory Visit: Payer: Self-pay

## 2017-11-12 ENCOUNTER — Ambulatory Visit: Payer: Self-pay | Admitting: Oncology

## 2017-11-14 ENCOUNTER — Telehealth: Payer: Self-pay | Admitting: Family Medicine

## 2017-11-14 DIAGNOSIS — R7989 Other specified abnormal findings of blood chemistry: Secondary | ICD-10-CM

## 2017-11-14 NOTE — Telephone Encounter (Signed)
Copied from [place]. Topic: Quick Communication - See Telephone Encounter >> Nov 14, 2017  9:01 AM Bea Graff, NT wrote: CRM for notification. See Telephone encounter for: 11/14/17. Pt requesting orders for labs to have her thyroid checked. Please call pt when the orders are in.

## 2017-11-17 ENCOUNTER — Other Ambulatory Visit: Payer: Self-pay | Admitting: Family Medicine

## 2017-11-17 ENCOUNTER — Other Ambulatory Visit: Payer: Self-pay | Admitting: Emergency Medicine

## 2017-11-17 NOTE — Telephone Encounter (Signed)
Please request name and fax of therapist.  Labs have been ordered. Once results are back you can fax it to them

## 2017-11-17 NOTE — Telephone Encounter (Signed)
Endocrinology cannot see her for appointment until June. Has a new Therapist and she would like labs for TSH, T4, T3 repeated.

## 2017-11-18 ENCOUNTER — Encounter: Payer: Self-pay | Admitting: Oncology

## 2017-11-18 ENCOUNTER — Inpatient Hospital Stay: Payer: Medicaid Other | Attending: Oncology

## 2017-11-18 ENCOUNTER — Other Ambulatory Visit: Payer: Self-pay | Admitting: Family Medicine

## 2017-11-18 ENCOUNTER — Inpatient Hospital Stay (HOSPITAL_BASED_OUTPATIENT_CLINIC_OR_DEPARTMENT_OTHER): Payer: Medicaid Other | Admitting: Oncology

## 2017-11-18 VITALS — BP 134/88 | HR 80 | Temp 97.3°F | Resp 18 | Wt 204.5 lb

## 2017-11-18 DIAGNOSIS — Z87891 Personal history of nicotine dependence: Secondary | ICD-10-CM | POA: Insufficient documentation

## 2017-11-18 DIAGNOSIS — C50212 Malignant neoplasm of upper-inner quadrant of left female breast: Secondary | ICD-10-CM

## 2017-11-18 DIAGNOSIS — Z170 Estrogen receptor positive status [ER+]: Secondary | ICD-10-CM

## 2017-11-18 DIAGNOSIS — H16002 Unspecified corneal ulcer, left eye: Secondary | ICD-10-CM | POA: Diagnosis not present

## 2017-11-18 DIAGNOSIS — R350 Frequency of micturition: Secondary | ICD-10-CM | POA: Diagnosis not present

## 2017-11-18 DIAGNOSIS — Z79810 Long term (current) use of selective estrogen receptor modulators (SERMs): Secondary | ICD-10-CM

## 2017-11-18 DIAGNOSIS — F411 Generalized anxiety disorder: Secondary | ICD-10-CM | POA: Diagnosis not present

## 2017-11-18 DIAGNOSIS — Z79899 Other long term (current) drug therapy: Secondary | ICD-10-CM

## 2017-11-18 DIAGNOSIS — F419 Anxiety disorder, unspecified: Secondary | ICD-10-CM | POA: Diagnosis not present

## 2017-11-18 LAB — COMPREHENSIVE METABOLIC PANEL
ALT: 21 U/L (ref 14–54)
AST: 24 U/L (ref 15–41)
Albumin: 3.3 g/dL — ABNORMAL LOW (ref 3.5–5.0)
Alkaline Phosphatase: 51 U/L (ref 38–126)
Anion gap: 9 (ref 5–15)
BUN: 16 mg/dL (ref 6–20)
CHLORIDE: 108 mmol/L (ref 101–111)
CO2: 23 mmol/L (ref 22–32)
CREATININE: 0.73 mg/dL (ref 0.44–1.00)
Calcium: 9.1 mg/dL (ref 8.9–10.3)
GFR calc Af Amer: >60 mL/min (ref >60)
GFR calc non Af Amer: >60 mL/min (ref >60)
Glucose, Bld: 113 mg/dL — ABNORMAL HIGH (ref 65–99)
Potassium: 3.5 mmol/L (ref 3.5–5.1)
SODIUM: 140 mmol/L (ref 135–145)
Total Bilirubin: 0.7 mg/dL (ref 0.3–1.2)
Total Protein: 6.1 g/dL — ABNORMAL LOW (ref 6.5–8.1)

## 2017-11-18 LAB — CBC WITH DIFFERENTIAL/PLATELET
Basophils Absolute: 0 10*3/uL (ref 0–0.1)
Basophils Relative: 1 %
EOS ABS: 0 10*3/uL (ref 0–0.7)
EOS PCT: 0 %
HCT: 38.5 % (ref 35.0–47.0)
Hemoglobin: 13.2 g/dL (ref 12.0–16.0)
LYMPHS ABS: 1.5 10*3/uL (ref 1.0–3.6)
Lymphocytes Relative: 38 %
MCH: 31.4 pg (ref 26.0–34.0)
MCHC: 34.2 g/dL (ref 32.0–36.0)
MCV: 91.8 fL (ref 80.0–100.0)
MONOS PCT: 8 %
Monocytes Absolute: 0.3 10*3/uL (ref 0.2–0.9)
Neutro Abs: 2.2 10*3/uL (ref 1.4–6.5)
Neutrophils Relative %: 53 %
PLATELETS: 238 10*3/uL (ref 150–440)
RBC: 4.19 MIL/uL (ref 3.80–5.20)
RDW: 13.5 % (ref 11.5–14.5)
WBC: 4.1 10*3/uL (ref 3.6–11.0)

## 2017-11-18 LAB — THYROID PANEL WITH TSH
Free Thyroxine Index: 5.5 — ABNORMAL HIGH (ref 1.4–3.8)
T3 UPTAKE: 37 % — AB (ref 22–35)
T4, Total: 14.9 ug/dL — ABNORMAL HIGH (ref 5.1–11.9)
TSH: <0.01 mIU/L — ABNORMAL LOW (ref 0.40–4.50)

## 2017-11-18 MED ORDER — SODIUM CHLORIDE 0.9% FLUSH
10.0000 mL | Freq: Once | INTRAVENOUS | Status: AC
  Administered 2017-11-18: 10 mL via INTRAVENOUS
  Filled 2017-11-18: qty 10

## 2017-11-18 MED ORDER — HEPARIN SOD (PORK) LOCK FLUSH 100 UNIT/ML IV SOLN
500.0000 [IU] | Freq: Once | INTRAVENOUS | Status: AC
  Administered 2017-11-18: 500 [IU] via INTRAVENOUS

## 2017-11-18 NOTE — Progress Notes (Addendum)
Hematology/Oncology Follow up note [facility] [phone] [phone]   Patient Care Team: Hubbard Hartshorn, FNP as PCP - General (Family Medicine) Rico Junker, RN as Registered Nurse Theodore Demark, RN as Registered Nurse  REFERRING PROVIDER: Hubbard Hartshorn, FNP REASON FOR VISIT Follow up for treatment of  Breast cancer  HISTORY OF PRESENTING ILLNESS:  Sheena Martinez is a  57 y.o.  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 Margin is negative, no LVI. Mammoprintr revealed 10 year recurrence rate at 29% high risk group. Patient has completed MammoSite RT. Denies any hormonal replacement therapy.  Adjuvant chemotherapy with TC, finished 3 cycles.  She is perimenopausal.   # His Sjogren disease for which she has had tear duct surgery. She also reports history of PCOS with elevated testosterone level.   Genetic testing:  Genetic testing result vis [facility] showed no pathogenic sequence appearance or deletions /duplications.  # she developed corneal ulcer for which she has to undergo emergency ophthalmology surgery.  This is considered to be related to Sjogren's syndrome.  Patient has been started on prednisone by ophthalmologist.  Her anxiety has not been well controlled lately due to her ophthalmology problems.  Patient has been on Paxil for a long time and recently switched to Lexapro as Paxil interfere with efficacy of tamoxifen.  She feels her anxiety is not well controlled.  Current Treatment Adjuvant TC x3. Patient declined cycle 4.  Started on Tamoxifen in March 2019.   INTERVAL HISTORY Patient  presented for follow up.  She continues to have left eye problem for which she is continued on steroids.  She has been seen and evaluated by [facility] psychiatrist Dr.Shelby Register ([phone]) and recommend patient to wean off Lexapro, and started on Zoloft. She also takes  buspirone 7.5 mg 3 times daily.  Patient still in the process of weaning off Lexapro and has not been started on Zoloft yet. She takes Ativan as needed for anxiety Patient reports having urgency and the frequency of urinations no dysuria.  She told me that she has called her primary care physician on Friday and was told that PCP cannot accommodate for her to have labs drawn on Friday.  She was offered a Tuesday appointment but since she has an appointment with me she canceled appointment with PCP. Denies any fever, chills, chest pain, shortness of breath, abdominal pain, back pain, lower extremity swelling..  She takes tamoxifen 20 mg daily, tolerates well.    Review of Systems  Constitutional: Negative for chills, fever and malaise/fatigue.  HENT: Negative for congestion, ear pain, hearing loss and nosebleeds.   Eyes: Positive for redness. Negative for discharge.  Respiratory: Negative for cough and sputum production.   Cardiovascular: Negative for chest pain, palpitations and claudication.  Gastrointestinal: Negative for diarrhea, nausea and vomiting.  Genitourinary: Negative for dysuria and urgency.  Musculoskeletal: Negative for joint pain and myalgias.  Skin: Negative for rash.  Neurological: Negative for dizziness, tingling, sensory change, focal weakness and weakness.  Endo/Heme/Allergies: Negative for environmental allergies. Does not bruise/bleed easily.  Psychiatric/Behavioral: Negative for memory loss and suicidal ideas. The patient is nervous/anxious.    MEDICAL HISTORY:  Past Medical History:  Diagnosis Date  . Anxiety   . Arthritis   . Breast cancer ([place]) 05/12/2017   INVASIVE MAMMARY CARCINOMA ER/PR positive LEFT BREAST UPPER inner  QUAD  . GERD (gastroesophageal reflux disease)    OCC  .  Hypothyroidism   . Sjogren's syndrome ([place])     SURGICAL HISTORY: Past Surgical History:  Procedure Laterality Date  . BREAST BIOPSY Left 05/12/2017   [place] bx/ INVASIVE MAMMARY  CARCINOMA  . BREAST LUMPECTOMY WITH SENTINEL LYMPH NODE BIOPSY Left 06/10/2017   Procedure: BREAST LUMPECTOMY WITH SENTINEL LYMPH NODE BX AND NEEDLE LOCALIZATION;  Surgeon: Robert Bellow, MD;  Location: [facility] ORS;  Service: General;  Laterality: Left;  . BREAST MAMMOSITE Left 06/24/2017   Procedure: MAMMOSITE BREAST;  Surgeon: Robert Bellow, MD;  Location: [facility] ORS;  Service: General;  Laterality: Left;  . CESAREAN SECTION  1995  . COSMETIC SURGERY    . CYST EXCISION  2018   pilar cyst/ Dr Will Bonnet  . PORTACATH PLACEMENT Right 07/08/2017   Procedure: INSERTION PORT-A-CATH;  Surgeon: Robert Bellow, MD;  Location: [facility] ORS;  Service: General;  Laterality: Right;  . TONSILLECTOMY      SOCIAL HISTORY: Social History   Socioeconomic History  . Marital status: Married    Spouse name: Not on file  . Number of children: Not on file  . Years of education: Not on file  . Highest education level: Not on file  Occupational History  . Not on file  Social Needs  . Financial resource strain: Not on file  . Food insecurity:    Worry: Never true    Inability: Never true  . Transportation needs:    Medical: No    Non-medical: No  Tobacco Use  . Smoking status: Former Smoker    Years: 7.00    Types: Cigarettes    Last attempt to quit: 08/13/1979    Years since quitting: 38.2  . Smokeless tobacco: Never Used  . Tobacco comment: AGE [age]  Substance and Sexual Activity  . Alcohol use: No  . Drug use: No  . Sexual activity: Yes  Lifestyle  . Physical activity:    Days per week: Not on file    Minutes per session: Not on file  . Stress: Not on file  Relationships  . Social connections:    Talks on phone: Not on file    Gets together: Not on file    Attends religious service: Not on file    Active member of club or organization: Not on file    Attends meetings of clubs or organizations: Not on file    Relationship status: Not on file  . Intimate partner violence:    Fear  of current or ex partner: Not on file    Emotionally abused: Not on file    Physically abused: Not on file    Forced sexual activity: Not on file  Other Topics Concern  . Not on file  Social History Narrative  . Not on file    FAMILY HISTORY: Family History  Problem Relation Age of Onset  . Breast cancer Maternal Aunt 60       currently ~65  . Diabetes Mother   . Skin cancer Mother        currently 60; TAH/BSO (age?)  . Anemia Mother   . Liver disease Father        deceased / not much info about him / alcoholic  . Lung cancer Paternal Aunt        smoker / deceased 92s  . Stomach cancer Paternal Uncle 86       deceased / deceased 84s  . Ovarian cancer Maternal Grandmother 25       deceased 10s  .  Stomach cancer Paternal Grandfather 49       deceased 71s  . Cancer Maternal Uncle        unk. type; deceased 52s  . Leukemia Cousin 68       deceased 25    ALLERGIES:  is allergic to sulfamethoxazole-trimethoprim.  MEDICATIONS:  Current Outpatient Medications  Medication Sig Dispense Refill  . acetaminophen (TYLENOL) 500 MG tablet Take 1,000 mg by mouth every 8 (eight) hours as needed for mild pain or headache.    . Ca Carbonate-Mag Hydroxide (ROLAIDS PO) Take 1 tablet as needed by mouth (indigestion).     . carboxymethylcellulose 1 % ophthalmic solution Place 1 drop into the left eye 4 (four) times daily.    Marland Kitchen escitalopram (LEXAPRO) 10 MG tablet Take 1/2 tablet x7 days, then increase to 1 tablet once daily. (Patient taking differently: Take 10 mg by mouth daily. ) 30 tablet 1  . ketoconazole (NIZORAL) 2 % shampoo Apply 1 application topically 2 (two) times a week.    . lidocaine-prilocaine (EMLA) cream Apply to affected area once 30 g 3  . meloxicam (MOBIC) 15 MG tablet Take 1 tablet (15 mg total) by mouth at bedtime. 30 tablet 2  . moxifloxacin (VIGAMOX) 0.5 % ophthalmic solution Place 1 drop into the left eye 4 (four) times daily.    . predniSONE (DELTASONE) 10 MG tablet  Take 40 mg by mouth daily.  0  . tamoxifen (NOLVADEX) 20 MG tablet Take 1 tablet (20 mg total) by mouth daily. 30 tablet 6  . Vitamin D, Ergocalciferol, (DRISDOL) 50000 units CAPS capsule Take 1 capsule (50,000 Units total) by mouth every 7 (seven) days. Once completed, take 1000IU daily OTC 8 capsule 0  . zolpidem (AMBIEN) 5 MG tablet Take 1 tablet (5 mg total) by mouth at bedtime as needed for sleep. 30 tablet 1   No current facility-administered medications for this visit.    Facility-Administered Medications Ordered in Other Visits  Medication Dose Route Frequency Provider Last Rate Last Dose  . sodium chloride flush (NS) 0.9 % injection 10 mL  10 mL Intravenous PRN Earlie Server, MD   10 mL at 10/01/17 0803      .  PHYSICAL EXAMINATION: ECOG PERFORMANCE STATUS: 0 - Asymptomatic Vitals:   11/18/17 1058  BP: 134/88  Pulse: 80  Resp: 18  Temp: (!) 97.3 F (36.3 C)   Physical Exam  Constitutional: She is oriented to person, place, and time and well-developed, well-nourished, and in no distress. No distress.  HENT:  Head: Normocephalic and atraumatic.  Mouth/Throat: No oropharyngeal exudate.  Eyes: Pupils are equal, round, and reactive to light. EOM are normal. No scleral icterus.  erythematous conjunctiva,   Neck: Normal range of motion. Neck supple. No thyromegaly present.  Cardiovascular: Normal rate, regular rhythm and normal heart sounds.  No murmur heard. Pulmonary/Chest: Effort normal and breath sounds normal. No respiratory distress. She has no wheezes.  Abdominal: Soft. Bowel sounds are normal. She exhibits no distension.  Musculoskeletal: Normal range of motion. She exhibits no edema.  Lymphadenopathy:    She has no cervical adenopathy.  Neurological: She is alert and oriented to person, place, and time. No cranial nerve deficit. Coordination normal.  Skin: Skin is dry. No rash noted. No erythema.  Psychiatric:  Anxious   LABORATORY DATA:  I have reviewed the data  as listed: no recent labs.  Lab Results  Component Value Date   WBC 4.1 11/18/2017   HGB 13.2 11/18/2017  HCT 38.5 11/18/2017   MCV 91.8 11/18/2017   PLT 238 11/18/2017   Recent Labs    09/10/17 0845 10/01/17 0803 10/13/17 1543  NA 139 138 135  K 3.7 3.6 3.6  CL 105 106 104  CO2 '23 27 23  '$ GLUCOSE 152* 140* 119*  BUN 14 17 24*  CREATININE 0.67 0.58 0.61  CALCIUM 8.7* 8.8* 9.1  GFRNONAA >60 >60 >60  GFRAA >60 >60 >60  PROT 6.2* 6.3* 7.1  ALBUMIN 3.1* 3.3* 3.8  AST 33 29 27  ALT '22 18 25  '$ ALKPHOS 89 82 83  BILITOT 0.4 0.7 1.0       ASSESSMENT & PLAN:  57 yo premenopausal female with Stage 1A breast cancer, high risk mammaprint index currently on adjuvant chemotherapy with TC. Cancer Staging Malignant neoplasm of upper-inner quadrant of left breast in female, estrogen receptor positive ([place]) Staging form: Breast, AJCC 8th Edition - Clinical stage from 05/22/2017: Stage IA (cT1, cN0, cM0, G1, ER: Positive, PR: Positive, HER2: Negative) - Signed by Earlie Server, MD on 05/22/2017 - Pathologic stage from 07/09/2017: Stage IA (pT1b, pN0, cM0, G2, ER: Positive, PR: Positive, HER2: Negative) - Signed by Earlie Server, MD on 07/30/2017  1. Long-term current use of tamoxifen   2. Malignant neoplasm of upper-inner quadrant of left breast in female, estrogen receptor positive ([place])   3. Generalized anxiety disorder   4. Increased urinary frequency   5. Ulcer of left cornea     # Continue Tamoxifen '20mg'$  daily.   # Exacerbation of her generalized anxiety is likely due to recent ophthalomolgy problem and current use of Prednisone.  Patient currently is being weaned off Lexapro due to lack of efficiency controlling her anxiety, and going to be started on Zoloft. She also takes Buspiron 7.5 mg 3 times daily Since patient is on tamoxifen, prefer patient to avoid SSRIs which are strong inhibitors of CYP2D6, which includes Paxil, Prozac, and Wellbutrin. Zoloft is considered to be moderate  inhibitor.  Would like to discuss with patient's psychiatrist. I called patient's [facility] psychiatrist Dr.Shelby Register and awaiting call back.  I will refer patient to psychiatrist who can help to monitor and adjust her anxiety medication.   # Dental clearance was previously received.  Patient has ongoing dental work and was not cleared. During the interval she has had tooth extracted and cavities filled. We have not received an update from her dentist yet.  Due to the ophthalmology problem, patient has not completed her dental work, therefore not cleared to be started on Zometa.  Discussed with patient that she is almost out of the window to receive adjuvant Zometa.  We will let her focus on her ophthalmology problems for now.     #Increase urinary frequency, with further questioning, patient also has brownish vaginal discharge. Planned to obtain a UA clean catch, and urine culture if indicated.  Patient cannot give a sample during the clinic.  Advised patient to follow-up with her primary care physician and have urine checked.  If UA is negative, recommend patient to obtain GYN evaluation to rule out vaginal infection.  # Cornea ulcer:  Continue follow up with Ophthalmology.  Currently on prednisone and doxycycline. #Patient's MediPort can be taken out or she can choose to keep it with port flush every 6-8 weeks.  Last flushed on 10/01/2017  Addendum: I discussed with patient's psychiatrist at [facility] whether other alternative such as Effexor or Celexa will be better options to be combined with Tamoxifen.  Dr. Wilburn Cornelia register thinks that Zoloft is a minor CYP2D6  inhibitor and is appropriate to be used in combination with tamoxifen. She has discussed with pharmacist there.  She will discuss with her attending Dr.Gala and if she decides to change to other psychiatry medication she will let patient know.  Return of visit: 3 months Total face to face encounter time for this patient visit  was 40 min. >50% of the time was  spent in counseling and coordination of care.    Earlie Server, MD, PhD Hematology Oncology [facility] at [facility] Pager- [phone] 11/18/2017

## 2017-11-18 NOTE — Progress Notes (Signed)
Pt in for follow up, reports having surgery for perforated cornea of left eye 2 weeks ago.  Pt reports some discomfort with it at times.  Pt will numerous med changes, allergy list and med list updated. Pt reports high level of anxiety as well as continued pressure feeling in bladder, request UA for lab testing.

## 2017-11-18 NOTE — Telephone Encounter (Signed)
Dr. Wilburn Cornelia Register at [place] [phone]. Patient came for labs today

## 2017-11-19 ENCOUNTER — Encounter: Payer: Self-pay | Admitting: *Deleted

## 2017-11-20 ENCOUNTER — Encounter: Payer: Self-pay | Admitting: Nurse Practitioner

## 2017-11-20 ENCOUNTER — Ambulatory Visit: Payer: Medicaid Other | Admitting: Nurse Practitioner

## 2017-11-20 VITALS — BP 136/72 | HR 100 | Temp 98.1°F | Resp 16 | Ht 60.0 in | Wt 204.6 lb

## 2017-11-20 DIAGNOSIS — R3989 Other symptoms and signs involving the genitourinary system: Secondary | ICD-10-CM

## 2017-11-20 DIAGNOSIS — Z1211 Encounter for screening for malignant neoplasm of colon: Secondary | ICD-10-CM

## 2017-11-20 DIAGNOSIS — R319 Hematuria, unspecified: Secondary | ICD-10-CM

## 2017-11-20 LAB — POCT URINALYSIS DIPSTICK
Bilirubin, UA: 4
Glucose, UA: NEGATIVE
Ketones, UA: NEGATIVE
Nitrite, UA: NEGATIVE
Protein, UA: NEGATIVE
Spec Grav, UA: 1.01 (ref 1.010–1.025)
Urobilinogen, UA: NEGATIVE E.U./dL — AB
pH, UA: 6 (ref 5.0–8.0)

## 2017-11-20 NOTE — Progress Notes (Signed)
Name: Sheena Martinez   MRN: [id number]    DOB: 1960-10-25   Date:11/20/2017       Progress Note  Subjective  Chief Complaint  Chief Complaint  Patient presents with  . Urinary Tract Infection    pressure for 6 days    HPI  UTI Symptoms Symptoms have been ongoing for 6 days. Patient endorses bladder pressure- states she urinates and still feels pressure afterwards. Noticed hematuria today- but maybe vaginal spotting.  Patient denies  dysuria, urinary frequency and urgency, suprapubic pain, fevers, chills, flank pain, CVA tenderness, n/v, fatigue, denies vaginal discharge, itching or pain.  Patient has tried drinking lots of water; one capsule of boric acid. Patient has not had any new sexual partners. Patient does not have has a history of UTIs. But has a history of vaginal infections used to take boric acid twice a week prior to chemo.   Patient was perimenopausal prior to chemo didn't have a period for about 6 months and then started having intermittent spotting since completing chemo jan.   Patient Active Problem List   Diagnosis Date Noted  . 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 Anxiety 10/03/2017  . Bilateral primary osteoarthritis of knee 10/03/2017  . 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 Malignant neoplasm of upper-inner quadrant of left breast in female, estrogen receptor positive ([place]) 05/22/2017  . Abnormal finding on thyroid function test 11/24/2015  . Hirsutism 11/24/2015  . Hyperlipidemia 02/01/2014  . Impaired glucose tolerance 02/01/2014  . Vitamin D deficiency 02/01/2014  . Family history of diabetes mellitus 01/31/2014  . Morbid obesity with BMI of 40.0-44.9, adult ([place]) 01/31/2014  . Sjogren's syndrome ([place]) 01/17/2014    Past Medical History:  Diagnosis Date  . Anxiety   . Arthritis   . Breast cancer ([place]) 05/12/2017   INVASIVE MAMMARY CARCINOMA ER/PR positive LEFT BREAST UPPER inner  QUAD  . GERD (gastroesophageal reflux disease)    OCC   . Hypothyroidism   . Sjogren's syndrome [facility])     Past Surgical History:  Procedure Laterality Date  . BREAST BIOPSY Left 05/12/2017   [place] bx/ INVASIVE MAMMARY CARCINOMA  . BREAST LUMPECTOMY WITH SENTINEL LYMPH NODE BIOPSY Left 06/10/2017   Procedure: BREAST LUMPECTOMY WITH SENTINEL LYMPH NODE BX AND NEEDLE LOCALIZATION;  Surgeon: Robert Bellow, MD;  Location: [facility] ORS;  Service: General;  Laterality: Left;  . BREAST MAMMOSITE Left 06/24/2017   Procedure: MAMMOSITE BREAST;  Surgeon: Robert Bellow, MD;  Location: [facility] ORS;  Service: General;  Laterality: Left;  . CESAREAN SECTION  1995  . COSMETIC SURGERY    . CYST EXCISION  2018   pilar cyst/ Dr Will Bonnet  . PORTACATH PLACEMENT Right 07/08/2017   Procedure: INSERTION PORT-A-CATH;  Surgeon: Robert Bellow, MD;  Location: [facility] ORS;  Service: General;  Laterality: Right;  . TONSILLECTOMY      Social History   Tobacco Use  . Smoking status: Former Smoker    Years: 7.00    Types: Cigarettes    Last attempt to quit: 08/13/1979    Years since quitting: 38.2  . Smokeless tobacco: Never Used  . Tobacco comment: AGE [age]  Substance Use Topics  . Alcohol use: No     Current Outpatient Medications:  .  acetaminophen (TYLENOL) 500 MG tablet, Take 1,000 mg by mouth every 8 (eight) hours as needed for mild pain or headache., Disp: , Rfl:  .  Ascorbic Acid (C-500) 500 MG CHEW, Chew by mouth., Disp: ,  Rfl:  .  busPIRone (BUSPAR) 7.5 MG tablet, Take by mouth., Disp: , Rfl:  .  Ca Carbonate-Mag Hydroxide (ROLAIDS PO), Take 1 tablet as needed by mouth (indigestion). , Disp: , Rfl:  .  carboxymethylcellulose 1 % ophthalmic solution, Place 1 drop into the left eye 4 (four) times daily., Disp: , Rfl:  .  cycloSPORINE (RESTASIS) 0.05 % ophthalmic emulsion, Administer 1 drop to both eyes Two (2) times a day., Disp: , Rfl:  .  doxycycline (VIBRAMYCIN) 50 MG capsule, Take by mouth., Disp: , Rfl:  .  escitalopram (LEXAPRO) 10 MG tablet,  Take 1/2 tablet x7 days, then increase to 1 tablet once daily. (Patient taking differently: Take 10 mg by mouth daily. ), Disp: 30 tablet, Rfl: 1 .  ketoconazole (NIZORAL) 2 % shampoo, Apply 1 application topically 2 (two) times a week., Disp: , Rfl:  .  lidocaine-prilocaine (EMLA) cream, Apply to affected area once, Disp: 30 g, Rfl: 3 .  LORazepam (ATIVAN) 0.5 MG tablet, , Disp: , Rfl: 0 .  meloxicam (MOBIC) 15 MG tablet, Take 1 tablet (15 mg total) by mouth at bedtime., Disp: 30 tablet, Rfl: 2 .  moxifloxacin (VIGAMOX) 0.5 % ophthalmic solution, Place 1 drop into the left eye 4 (four) times daily., Disp: , Rfl:  .  prednisoLONE acetate (PRED FORTE) 1 % ophthalmic suspension, , Disp: , Rfl: 1 .  predniSONE (DELTASONE) 10 MG tablet, Take 40 mg by mouth daily., Disp: , Rfl: 0 .  sertraline (ZOLOFT) 50 MG tablet, , Disp: , Rfl: 0 .  tamoxifen (NOLVADEX) 20 MG tablet, Take 1 tablet (20 mg total) by mouth daily., Disp: 30 tablet, Rfl: 6 .  Vitamin D, Ergocalciferol, (DRISDOL) 50000 units CAPS capsule, Take 1 capsule (50,000 Units total) by mouth every 7 (seven) days. Once completed, take 1000IU daily OTC, Disp: 8 capsule, Rfl: 0 .  Vitamin D, Ergocalciferol, (DRISDOL) 50000 units CAPS capsule, Take by mouth., Disp: , Rfl:  .  zolpidem (AMBIEN) 5 MG tablet, Take 1 tablet (5 mg total) by mouth at bedtime as needed for sleep., Disp: 30 tablet, Rfl: 1 No current facility-administered medications for this visit.   Facility-Administered Medications Ordered in Other Visits:  .  sodium chloride flush (NS) 0.9 % injection 10 mL, 10 mL, Intravenous, PRN, Earlie Server, MD, 10 mL at 10/01/17 0803  Allergies  Allergen Reactions  . Klonopin [Clonazepam]   . Sulfamethoxazole-Trimethoprim Rash    Chest tightness. Bactrim    ROS  Constitutional: Negative for fever or weight change.  Respiratory: Negative for cough and shortness of breath.   Cardiovascular: Negative for chest pain or palpitations.   Gastrointestinal: Negative for abdominal pain, no bowel changes.  Musculoskeletal: Negative for gait problem or joint swelling.  Skin: Negative for rash.  Neurological: Negative for dizziness or headache.  No other specific complaints in a complete review of systems (except as listed in HPI above).  Objective  Vitals:   11/20/17 1359  BP: 136/72  Pulse: 100  Resp: 16  Temp: 98.1 F (36.7 C)  TempSrc: Oral  SpO2: 94%  Weight: 204 lb 9.6 oz (92.8 kg)  Height: 5' (1.524 m)    Body mass index is 39.96 kg/m.  Nursing Note and Vital Signs reviewed.  Physical Exam  Constitutional: Patient appears well-developed and well-nourished. Obese  No distress.  Cardiovascular: Normal rate, regular rhythm, S1/S2 present.   Pulmonary/Chest: Effort normal and breath sounds clear.  Abdominal: Soft and non-tender, bowel sounds present, no CVA tenderness,  no suprapubic pain with pressure  Psychiatric: Patient has a normal mood and affect. behavior is normal. Judgment and thought content normal.  Results for orders placed or performed in visit on 11/20/17 (from the past 72 hour(s))  POCT urinalysis dipstick     Status: Abnormal   Collection Time: 11/20/17  2:03 PM  Result Value Ref Range   Color, UA gold    Clarity, UA clear    Glucose, UA negative    Bilirubin, UA 4    Ketones, UA negative    Spec Grav, UA 1.010 1.010 - 1.025   Blood, UA large    pH, UA 6.0 5.0 - 8.0   Protein, UA negative    Urobilinogen, UA negative (A) 0.2 or 1.0 E.U./dL   Nitrite, UA negative    Leukocytes, UA Trace (A) Negative   Appearance clear    Odor none     Assessment & Plan  Patient endorses sensation of bladder fullness despite emptying bladder and endorses vaginal spotting. Trace leuks noted in sample and large blood. Will culture and treat pending results. Will redraw UA sample to recheck for blood in urine in one week. Discussed sending to urologist patient requesting to wait for UA retest.  Discussed risk and benefits with patient.   1. Sensation of pressure in bladder area - POCT urinalysis dipstick - Urine Culture   -Red flags and when to present for emergency care or RTC including fever >101.29F, chest pain, shortness of breath, new/worsening/un-resolving symptoms,  reviewed with patient at time of visit. Follow up and care instructions discussed and provided in AVS. -Reviewed Health Maintenance: spoke to Dr. Sanda Klein patient can get cologuard will order.

## 2017-11-21 ENCOUNTER — Encounter: Payer: Medicaid Other | Admitting: Family Medicine

## 2017-11-21 LAB — URINE CULTURE
MICRO NUMBER:: 90449048
SPECIMEN QUALITY:: ADEQUATE

## 2017-11-25 ENCOUNTER — Telehealth: Payer: Self-pay | Admitting: Nurse Practitioner

## 2017-11-25 DIAGNOSIS — Z170 Estrogen receptor positive status [ER+]: Secondary | ICD-10-CM | POA: Diagnosis not present

## 2017-11-25 DIAGNOSIS — M3500 Sicca syndrome, unspecified: Secondary | ICD-10-CM | POA: Diagnosis not present

## 2017-11-25 DIAGNOSIS — C50212 Malignant neoplasm of upper-inner quadrant of left female breast: Secondary | ICD-10-CM | POA: Diagnosis not present

## 2017-11-25 DIAGNOSIS — E0590 Thyrotoxicosis, unspecified without thyrotoxic crisis or storm: Secondary | ICD-10-CM | POA: Diagnosis not present

## 2017-11-25 DIAGNOSIS — H16002 Unspecified corneal ulcer, left eye: Secondary | ICD-10-CM | POA: Diagnosis not present

## 2017-11-25 DIAGNOSIS — Z1211 Encounter for screening for malignant neoplasm of colon: Secondary | ICD-10-CM | POA: Diagnosis not present

## 2017-11-25 NOTE — Telephone Encounter (Signed)
Ok yes it is un front in the filing cabinet under miscellaneous. It was faxed and transmission was confirmed. Sorry did not recognize the name until you said this.

## 2017-11-25 NOTE — Telephone Encounter (Signed)
I believe this was one of the ones I asked you to fax for me yesterday, I believe it got a confirmation.

## 2017-11-25 NOTE — Telephone Encounter (Signed)
error 

## 2017-12-09 DIAGNOSIS — Z947 Corneal transplant status: Secondary | ICD-10-CM | POA: Insufficient documentation

## 2017-12-11 ENCOUNTER — Encounter: Payer: Self-pay | Admitting: Oncology

## 2017-12-11 ENCOUNTER — Telehealth: Payer: Self-pay | Admitting: *Deleted

## 2017-12-11 NOTE — Telephone Encounter (Signed)
I have not received any documentation regarding this.  I have called the office of Dr Greer Ee giving Dr Collie Siad cell phone number, requesting for her to call Dr Tasia Catchings.

## 2017-12-11 NOTE — Telephone Encounter (Signed)
Patient reports that she is seeing rheumatology for her eyes and they want her to get Rituxan treatments. She states something was faxed to Dr Tasia Catchings for clearance and they have not heard back from [place] about it. Please advise on this matter  Telephone Encounter - Greer Ee, MD - 12/11/2017 10:39 AM EDT I called the patient to discuss starting the medication rituximab for her eyes. I told her I called her oncologist and left a message so that they could approve this medication however I was not able to get in touch with them. Patient stated that she will reach out to them and took the information about the medication. I reviewed the risk and benefits with the medication and told her that in terms of steroid tapering her ophthalmologist would have to manage that since she directly looks at the eye to see if it has healed. Patient is understanding of this process.  Electronically signed by Greer Ee, MD at 12/11/2017 10:40 AM EDT

## 2017-12-11 NOTE — Telephone Encounter (Deleted)
They faxed it today at 12:30, I will give it to Dr Tasia Catchings to review.

## 2017-12-11 NOTE — Telephone Encounter (Signed)
Last thyroid panel is from 04/09 please fax as requested

## 2017-12-12 ENCOUNTER — Other Ambulatory Visit: Payer: Self-pay | Admitting: Nurse Practitioner

## 2017-12-12 DIAGNOSIS — R946 Abnormal results of thyroid function studies: Secondary | ICD-10-CM

## 2017-12-15 NOTE — Telephone Encounter (Signed)
Faxed 4/9 labs over. Spoke to [place] and the Dr.Dear in [place] has addressed labs. She will repeat labs on Thursday for her next appointment on May 16. She could not get in with [facility]

## 2017-12-17 ENCOUNTER — Telehealth: Payer: Self-pay | Admitting: Oncology

## 2017-12-17 NOTE — Telephone Encounter (Signed)
CMA called Dr.Jaleel's office and left my cell phone for Dr.Jaleel to call back. I have not received any calls so far.  Today I called [facility] rheumatology clinic and left my cell phone number. Awaiting Dr.Jaleel to call back.

## 2017-12-18 ENCOUNTER — Other Ambulatory Visit: Payer: Self-pay | Admitting: Emergency Medicine

## 2017-12-18 DIAGNOSIS — R7989 Other specified abnormal findings of blood chemistry: Secondary | ICD-10-CM

## 2017-12-18 DIAGNOSIS — R946 Abnormal results of thyroid function studies: Secondary | ICD-10-CM

## 2017-12-19 LAB — T4: T4 TOTAL: 13.3 ug/dL — AB (ref 5.1–11.9)

## 2017-12-19 LAB — T3: T3, Total: 127 ng/dL (ref 76–181)

## 2017-12-19 LAB — TSH

## 2017-12-24 LAB — COLOGUARD: COLOGUARD: NEGATIVE

## 2017-12-25 ENCOUNTER — Encounter: Payer: Medicaid Other | Admitting: Family Medicine

## 2018-01-04 ENCOUNTER — Encounter: Payer: Self-pay | Admitting: Oncology

## 2018-01-06 ENCOUNTER — Ambulatory Visit: Payer: Medicaid Other | Admitting: General Surgery

## 2018-01-06 ENCOUNTER — Encounter: Payer: Self-pay | Admitting: General Surgery

## 2018-01-06 VITALS — BP 130/80 | HR 91 | Resp 13 | Ht 60.0 in | Wt 197.0 lb

## 2018-01-06 DIAGNOSIS — Z170 Estrogen receptor positive status [ER+]: Secondary | ICD-10-CM

## 2018-01-06 DIAGNOSIS — C50212 Malignant neoplasm of upper-inner quadrant of left female breast: Secondary | ICD-10-CM

## 2018-01-06 NOTE — Progress Notes (Signed)
Patient ID: Sheena Martinez, female   DOB: 1961-04-29, 57 y.o.   MRN: [id number]  Chief Complaint  Patient presents with  . Follow-up    HPI Sheena Martinez is a 57 y.o. female here today for her follow up breast cancer check up. Patient states she is doing well. Her lst chemotherapy was January 9. Tolerating tamoxifen. She is currently on prednisone 40 mg. She had an infusion at [facility].  She is here with friend, Kizzie Fantasia.  HPI  Past Medical History:  Diagnosis Date  . Anxiety   . Arthritis   . 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 GERD (gastroesophageal reflux disease)    OCC  . Goiter   . Hyperthyroidism   . Sjogren's syndrome [facility])     Past Surgical History:  Procedure Laterality Date  . BREAST BIOPSY Left 05/12/2017   [place] bx/ INVASIVE MAMMARY CARCINOMA  . BREAST LUMPECTOMY WITH SENTINEL LYMPH NODE BIOPSY Left 06/10/2017   Procedure: BREAST LUMPECTOMY WITH SENTINEL LYMPH NODE BX AND NEEDLE LOCALIZATION;  Surgeon: Robert Bellow, MD;  Location: [facility] ORS;  Service: General;  Laterality: Left;  . BREAST MAMMOSITE Left 06/24/2017   Procedure: MAMMOSITE BREAST;  Surgeon: Robert Bellow, MD;  Location: [facility] ORS;  Service: General;  Laterality: Left;  . CESAREAN SECTION  1995  . COSMETIC SURGERY    . CYST EXCISION  2018   pilar cyst/ Dr Will Bonnet  . PORTACATH PLACEMENT Right 07/08/2017   Procedure: INSERTION PORT-A-CATH;  Surgeon: Robert Bellow, MD;  Location: [facility] ORS;  Service: General;  Laterality: Right;  . TONSILLECTOMY      Family History  Problem Relation Age of Onset  . Breast cancer Maternal Aunt 40       currently ~65  . Diabetes Mother   . Skin cancer Mother        currently 98; TAH/BSO (age?)  . Anemia Mother   . Liver disease Father        deceased / not much info about him / alcoholic  . Lung cancer Paternal Aunt        smoker / deceased 86s  . Stomach  cancer Paternal Uncle 43       deceased / deceased 91s  . Ovarian cancer Maternal Grandmother 68       deceased 34s  . Stomach cancer Paternal Grandfather 93       deceased 48s  . Cancer Maternal Uncle        unk. type; deceased 57s  . Leukemia Cousin 76       deceased 81    Social History Social History   Tobacco Use  . Smoking status: Former Smoker    Years: 7.00    Types: Cigarettes    Last attempt to quit: 08/13/1979    Years since quitting: 38.4  . Smokeless tobacco: Never Used  . Tobacco comment: AGE [age]  Substance Use Topics  . Alcohol use: No  . Drug use: No    Allergies  Allergen Reactions  . Klonopin [Clonazepam]   . Sulfamethoxazole-Trimethoprim Rash    Chest tightness. Bactrim    Current Outpatient Medications  Medication Sig Dispense Refill  . acetaminophen (TYLENOL) 500 MG tablet Take 1,000 mg by mouth every 8 (eight) hours as needed for mild pain or headache.    . busPIRone (BUSPAR) 7.5 MG tablet Take by mouth.    . Ca  Carbonate-Mag Hydroxide (ROLAIDS PO) Take 1 tablet as needed by mouth (indigestion).     . carboxymethylcellulose 1 % ophthalmic solution Place 1 drop into the left eye 4 (four) times daily.    . cycloSPORINE (RESTASIS) 0.05 % ophthalmic emulsion Administer 1 drop to both eyes Two (2) times a day.    . ketoconazole (NIZORAL) 2 % shampoo Apply 1 application topically 2 (two) times a week.    . lidocaine-prilocaine (EMLA) cream Apply to affected area once 30 g 3  . LORazepam (ATIVAN) 0.5 MG tablet   0  . meloxicam (MOBIC) 15 MG tablet Take 1 tablet (15 mg total) by mouth at bedtime. 30 tablet 2  . moxifloxacin (VIGAMOX) 0.5 % ophthalmic solution Place 1 drop into the left eye 4 (four) times daily.    . prednisoLONE acetate (PRED FORTE) 1 % ophthalmic suspension   1  . predniSONE (DELTASONE) 10 MG tablet Take 40 mg by mouth daily.  0  . sertraline (ZOLOFT) 50 MG tablet Take 50 mg by mouth daily.   0  . tamoxifen (NOLVADEX) 20 MG tablet Take  1 tablet (20 mg total) by mouth daily. 30 tablet 6  . Vitamin D, Ergocalciferol, (DRISDOL) 50000 units CAPS capsule Take 1 capsule (50,000 Units total) by mouth every 7 (seven) days. Once completed, take 1000IU daily OTC 8 capsule 0  . Vitamin D, Ergocalciferol, (DRISDOL) 50000 units CAPS capsule Take by mouth.     No current facility-administered medications for this visit.    Facility-Administered Medications Ordered in Other Visits  Medication Dose Route Frequency Provider Last Rate Last Dose  . sodium chloride flush (NS) 0.9 % injection 10 mL  10 mL Intravenous PRN Earlie Server, MD   10 mL at 10/01/17 0803    Review of Systems Review of Systems  Constitutional: Negative.   Respiratory: Negative.   Cardiovascular: Negative.     Blood pressure 130/80, pulse 91, resp. rate 13, height 5' (1.524 m), weight 197 lb (89.4 kg), last menstrual period 01/05/2018.  Physical Exam Physical Exam  Constitutional: She is oriented to person, place, and time. She appears well-developed and well-nourished.  Cardiovascular: Normal rate, regular rhythm and normal heart sounds.  Pulmonary/Chest: Effort normal and breath sounds normal. Right breast exhibits no inverted nipple, no mass, no nipple discharge, no skin change and no tenderness. Left breast exhibits no inverted nipple, no mass, no nipple discharge, no skin change and no tenderness.    Neurological: She is alert and oriented to person, place, and time.  Skin: Skin is dry.       Assessment    Doing well 6 months post left breast wide excision.    Plan  Patient to return in four months for bilateral diagnotic mammogram The patient is aware to call back for any questions or concerns.   HPI, Physical Exam, Assessment and Plan have been scribed under the direction and in the presence of Hervey Ard, MD.  Gaspar Cola, CMA  I have completed the exam and reviewed the above documentation for accuracy and completeness.  I agree with the  above.  Haematologist has been used and any errors in dictation or transcription are unintentional.  Hervey Ard, M.D., F.A.C.S.  Forest Gleason Elysha Daw 01/06/2018, 3:39 PM

## 2018-01-06 NOTE — Patient Instructions (Addendum)
Patient to return in four months for bilateral diagnotic mammogram The patient is aware to call back for any questions or concerns.

## 2018-01-14 ENCOUNTER — Other Ambulatory Visit: Payer: Self-pay

## 2018-01-14 ENCOUNTER — Inpatient Hospital Stay: Payer: Medicaid Other | Attending: Radiation Oncology

## 2018-01-14 ENCOUNTER — Encounter: Payer: Self-pay | Admitting: Radiation Oncology

## 2018-01-14 ENCOUNTER — Ambulatory Visit
Admission: RE | Admit: 2018-01-14 | Discharge: 2018-01-14 | Disposition: A | Discharge status: Home / Self Care | Payer: Medicaid Other | Source: Ambulatory Visit | Attending: Radiation Oncology | Admitting: Radiation Oncology

## 2018-01-14 VITALS — BP 132/86 | HR 81 | Temp 97.5°F | Resp 20 | Wt 198.3 lb

## 2018-01-14 DIAGNOSIS — Z79810 Long term (current) use of selective estrogen receptor modulators (SERMs): Secondary | ICD-10-CM | POA: Insufficient documentation

## 2018-01-14 DIAGNOSIS — C50212 Malignant neoplasm of upper-inner quadrant of left female breast: Secondary | ICD-10-CM | POA: Diagnosis present

## 2018-01-14 DIAGNOSIS — Z170 Estrogen receptor positive status [ER+]: Secondary | ICD-10-CM | POA: Diagnosis not present

## 2018-01-14 DIAGNOSIS — E079 Disorder of thyroid, unspecified: Secondary | ICD-10-CM | POA: Diagnosis not present

## 2018-01-14 DIAGNOSIS — F419 Anxiety disorder, unspecified: Secondary | ICD-10-CM | POA: Insufficient documentation

## 2018-01-14 DIAGNOSIS — Z923 Personal history of irradiation: Secondary | ICD-10-CM | POA: Insufficient documentation

## 2018-01-14 NOTE — Progress Notes (Unsigned)
Survivorship Care Plan visit completed.  Treatment summary reviewed and given to patient.  ASCO answers booklet reviewed and given to patient.  CARE program and Cancer Transitions discussed with patient along with other resources cancer center offers to patients and caregivers.  Patient verbalized understanding.    

## 2018-01-14 NOTE — Progress Notes (Signed)
Radiation Oncology Follow up Note  Name: Sheena Martinez   Date:   01/14/2018 MRN:  [id number] DOB: 1960-12-04    This 57 y.o. 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  COMPLICATIONS OF TREATMENT: none  FOLLOW UP COMPLIANCE: keeps appointments   PHYSICAL EXAM:  BP 132/86   Pulse 81   Temp (!) 97.5 F (36.4 C)   Resp 20   Wt 198 lb 4.9 oz (89.9 kg)   LMP 01/05/2018   BMI 38.73 kg/m  Lungs are clear to A&P cardiac examination essentially unremarkable with regular rate and rhythm. No dominant mass or nodularity is noted in either breast in 2 positions examined. Incision is well-healed. No axillary or supraclavicular adenopathy is appreciated. Cosmetic result is excellent. Well-developed well-nourished patient in NAD. HEENT reveals PERLA, EOMI, discs not visualized.  Oral cavity is clear. No oral mucosal lesions are identified. Neck is clear without evidence of cervical or supraclavicular adenopathy. Lungs are clear to A&P. Cardiac examination is essentially unremarkable with regular rate and rhythm without murmur rub or thrill. Abdomen is benign with no organomegaly or masses noted. Motor sensory and DTR levels are equal and symmetric in the upper and lower  extremities. Cranial nerves II through XII are grossly intact. Proprioception is intact. No peripheral adenopathy or edema is identified. No motor or sensory levels are noted. Crude visual fields are within normal range.  RADIOLOGY RESULTS: no current films for review  PLAN: present time she is doing well from a breast standpoint. She has no evidence of disease. She continues close follow-up care with surgeon and medical oncology. I have asked to see her back in 6 months and will go to once your follow-up visits. She or he has follow-up mammograms ordered by surgeon. Patient knows to call with any concerns.  I would like to take this opportunity to thank you for allowing me to participate in the care of your patient.Noreene Filbert, MD

## 2018-01-18 ENCOUNTER — Encounter: Payer: Self-pay | Admitting: Family Medicine

## 2018-01-22 ENCOUNTER — Encounter: Payer: Self-pay | Admitting: Nurse Practitioner

## 2018-01-22 ENCOUNTER — Ambulatory Visit: Payer: Medicaid Other | Admitting: Nurse Practitioner

## 2018-01-22 VITALS — BP 118/68 | HR 97 | Temp 99.1°F | Resp 14 | Ht 60.0 in | Wt 195.8 lb

## 2018-01-22 DIAGNOSIS — E559 Vitamin D deficiency, unspecified: Secondary | ICD-10-CM

## 2018-01-22 DIAGNOSIS — F419 Anxiety disorder, unspecified: Secondary | ICD-10-CM

## 2018-01-22 DIAGNOSIS — R946 Abnormal results of thyroid function studies: Secondary | ICD-10-CM | POA: Diagnosis not present

## 2018-01-22 DIAGNOSIS — M170 Bilateral primary osteoarthritis of knee: Secondary | ICD-10-CM

## 2018-01-22 DIAGNOSIS — R251 Tremor, unspecified: Secondary | ICD-10-CM

## 2018-01-22 NOTE — Progress Notes (Addendum)
Name: Sheena Martinez   MRN: [id number]    DOB: Mar 05, 1961   Date:01/22/2018       Progress Note  Subjective  Chief Complaint  Chief Complaint  Patient presents with  . Follow-up    HPI  Patient has second eye surgery for corneal perforation- new corneal replacement was thining so did second replacement mid-may 2019. Notes vision had gotten a little blurrier saw opthalmology yesterday and has been given eye drops. No pain in eye. 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 Endocrinologist and Opthalmologic are discussing surgical/radiation options.   Anxiety  Was doing really well until yesterday with eye blurriness.  States zoloft and buspar seems to be working well. Taking ativan 3 times a day- follows up with Dr. Elige Radon [facility] next appt is 4/2  GAD 7 : Generalized Anxiety Score 01/22/2018  Nervous, Anxious, on Edge 3  Control/stop worrying 1  Worry too much - different things 1  Trouble relaxing 1  Restless 0  Easily annoyed or irritable 0  Afraid - awful might happen 0  Total GAD 7 Score 6    Depression screen PHQ 2/9 01/22/2018  Decreased Interest 0  Down, Depressed, Hopeless 0  PHQ - 2 Score 0  Altered sleeping 0  Tired, decreased energy 1  Change in appetite 0  Feeling bad or failure about yourself  0  Trouble concentrating 0  Moving slowly or fidgety/restless 0  Suicidal thoughts 0  PHQ-9 Score 1  Difficult doing work/chores Very difficult   Patient states has been on mobic for years due to body aches and joint pain- for arthritis in bilateral knees. Stopped taking it a few times and cant walk by third day. Patient saw orthopedist for this in the past had cortisone shots in the knees sts is much improved.   Bilateral hand tremors- is not sure how long they've been going on but realized them on mothers day. sts endo had tried medication that lowered her heart rate but didn't like the feeling she had so stopped it. sts endo requesting [place]  to monitor thyroid levels. Will receive clarification from specialist    Patient Active Problem List   Diagnosis Date Noted  . 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 Anxiety 10/03/2017  . Bilateral primary osteoarthritis of knee 10/03/2017  . 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 Malignant neoplasm of upper-inner quadrant of left breast in female, estrogen receptor positive ([place]) 05/22/2017  . Abnormal finding on thyroid function test 11/24/2015  . Hirsutism 11/24/2015  . Hyperlipidemia 02/01/2014  . Impaired glucose tolerance 02/01/2014  . Vitamin D deficiency 02/01/2014  . Family history of diabetes mellitus 01/31/2014  . Morbid obesity with BMI of 40.0-44.9, adult ([place]) 01/31/2014  . Sjogren's syndrome ([place]) 01/17/2014    Past Medical History:  Diagnosis Date  . Anxiety   . Arthritis   . 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 GERD (gastroesophageal reflux disease)    OCC  . Goiter   . Hyperthyroidism   . Sjogren's syndrome [facility])     Past Surgical History:  Procedure Laterality Date  . BREAST BIOPSY Left 05/12/2017   [place] bx/ INVASIVE MAMMARY CARCINOMA  . BREAST LUMPECTOMY WITH SENTINEL LYMPH NODE BIOPSY Left 06/10/2017   Procedure: BREAST LUMPECTOMY WITH SENTINEL LYMPH NODE BX AND NEEDLE LOCALIZATION;  Surgeon: Robert Bellow, MD;  Location: [facility] ORS;  Service:  General;  Laterality: Left;  . BREAST MAMMOSITE Left 06/24/2017   Procedure: MAMMOSITE BREAST;  Surgeon: Robert Bellow, MD;  Location: [facility] ORS;  Service: General;  Laterality: Left;  . CESAREAN SECTION  1995  . COSMETIC SURGERY    . CYST EXCISION  2018   pilar cyst/ Dr Will Bonnet  . PORTACATH PLACEMENT Right 07/08/2017   Procedure: INSERTION PORT-A-CATH;  Surgeon: Robert Bellow, MD;  Location: [facility] ORS;  Service: General;  Laterality: Right;  . TONSILLECTOMY      Social History   Tobacco Use  . Smoking status:  Former Smoker    Years: 7.00    Types: Cigarettes    Last attempt to quit: 08/13/1979    Years since quitting: 38.4  . Smokeless tobacco: Never Used  . Tobacco comment: AGE [age]  Substance Use Topics  . Alcohol use: No     Current Outpatient Medications:  .  acetaminophen (TYLENOL) 500 MG tablet, Take 1,000 mg by mouth every 8 (eight) hours as needed for mild pain or headache., Disp: , Rfl:  .  busPIRone (BUSPAR) 7.5 MG tablet, Take by mouth., Disp: , Rfl:  .  Ca Carbonate-Mag Hydroxide (ROLAIDS PO), Take 1 tablet as needed by mouth (indigestion). , Disp: , Rfl:  .  carboxymethylcellulose 1 % ophthalmic solution, Place 1 drop into the left eye 4 (four) times daily., Disp: , Rfl:  .  cycloSPORINE (RESTASIS) 0.05 % ophthalmic emulsion, Administer 1 drop to both eyes Two (2) times a day., Disp: , Rfl:  .  ketoconazole (NIZORAL) 2 % shampoo, Apply 1 application topically 2 (two) times a week., Disp: , Rfl:  .  lidocaine-prilocaine (EMLA) cream, Apply to affected area once, Disp: 30 g, Rfl: 3 .  LORazepam (ATIVAN) 0.5 MG tablet, , Disp: , Rfl: 0 .  meloxicam (MOBIC) 15 MG tablet, Take 1 tablet (15 mg total) by mouth at bedtime., Disp: 30 tablet, Rfl: 2 .  methimazole (TAPAZOLE) 10 MG tablet, TAKE 1 TABLET BY MOUTH ONCE DAILY AT 6AM, Disp: , Rfl: 2 .  moxifloxacin (VIGAMOX) 0.5 % ophthalmic solution, Place 1 drop into the left eye 4 (four) times daily., Disp: , Rfl:  .  prednisoLONE acetate (PRED FORTE) 1 % ophthalmic suspension, , Disp: , Rfl: 1 .  predniSONE (DELTASONE) 10 MG tablet, Take 40 mg by mouth daily., Disp: , Rfl: 0 .  sertraline (ZOLOFT) 100 MG tablet, Take 100 mg by mouth daily., Disp: , Rfl: 1 .  sertraline (ZOLOFT) 50 MG tablet, Take 50 mg by mouth daily. , Disp: , Rfl: 0 .  tamoxifen (NOLVADEX) 20 MG tablet, Take 1 tablet (20 mg total) by mouth daily., Disp: 30 tablet, Rfl: 6 .  Vitamin D, Ergocalciferol, (DRISDOL) 50000 units CAPS capsule, Take 1 capsule (50,000 Units total)  by mouth every 7 (seven) days. Once completed, take 1000IU daily OTC, Disp: 8 capsule, Rfl: 0 .  Vitamin D, Ergocalciferol, (DRISDOL) 50000 units CAPS capsule, Take by mouth., Disp: , Rfl:  No current facility-administered medications for this visit.   Facility-Administered Medications Ordered in Other Visits:  .  sodium chloride flush (NS) 0.9 % injection 10 mL, 10 mL, Intravenous, PRN, Earlie Server, MD, 10 mL at 10/01/17 0803  Allergies  Allergen Reactions  . Klonopin [Clonazepam]   . Sulfamethoxazole-Trimethoprim Rash    Chest tightness. Bactrim    ROS  Constitutional: Negative for fever or weight change.  Respiratory: Negative for cough and shortness of breath.   Cardiovascular: Negative for chest  pain or palpitations.  Gastrointestinal: Negative for abdominal pain, no bowel changes.  Musculoskeletal: Negative for gait problem or joint swelling.  Skin: Negative for rash.  Neurological: Negative for dizziness or headache.  No other specific complaints in a complete review of systems (except as listed in HPI above).  Objective  Vitals:   01/22/18 1035  BP: 118/68  Pulse: 97  Resp: 14  Temp: 99.1 F (37.3 C)  TempSrc: Oral  SpO2: 95%  Weight: 195 lb 12.8 oz (88.8 kg)  Height: 5' (1.524 m)    Body mass index is 38.24 kg/m.  Nursing Note and Vital Signs reviewed.  Physical Exam  Constitutional: Patient appears well-developed and well-nourished. Obese  No distress.  Neck: no obvious thyromegaly.  Cardiovascular: mildly elevated rate, regular rhythm, S1/S2 present.   Pulmonary/Chest: Effort normal and breath sounds clear. No respiratory distress or retractions. Neuro: alert and oriented, mild hand tremor noted with arms extended.  Psychiatric: Patient has a normal mood and affect. behavior is normal. Judgment and thought content normal.  No results found for this or any previous visit (from the past 72 hour(s)).  Assessment & Plan  1. Abnormal finding on thyroid  function test - follow up with endorinology, put call out to endocrinologist for clarification of orders- pt std endocrinologist would like [place] to order thyroid panel and states  - TSH; Future  2. Bilateral primary osteoarthritis of knee -discussed reduction of mobic, add tylenol.  - meloxicam (MOBIC) 7.5 MG tablet; Take 1 tablet (7.5 mg total) by mouth at bedtime.  Dispense: 30 tablet; Refill: 2  3. Vitamin D deficiency  - Vitamin D (25 hydroxy); Future  4. Tremor of both hands - likely hyperthryoidism related, had tried unknown BB in the past, will consult with endo,  5. Anxiety - seems to be health related at this time discussed in detail health and specialty coordination   Face-to-face time with patient was more than 25 minutes, >50% time spent counseling and coordination of care -Red flags and when to present for emergency care or RTC including fever >101.15F, chest pain, shortness of breath, new/worsening/un-resolving symptoms,  reviewed with patient at time of visit. Follow up and care instructions discussed and provided in AVS.  -------------------------------------------------- I have reviewed this encounter including the documentation in this note and/or discussed this patient with the provider, Suezanne Cheshire DNP AGNP-C. I am certifying that I agree with the content of this note as supervising physician. Enid Derry, [facility] 01/23/2018, 5:34 PM

## 2018-01-22 NOTE — Patient Instructions (Addendum)
-   Cut meloxicam to 7.5mg  dose for arthritic pain, can supplement with tylenol for pain (no more than 3,000mg /day) and use ice and heat.  - Continue follow up's with specialists - Return for labs only appointment in one week.  - Return in one month for hand tremors or sooner with any new or concerning symptoms

## 2018-01-23 MED ORDER — MELOXICAM 7.5 MG PO TABS: 7.5000 mg | ORAL_TABLET | Freq: Every day | ORAL | 2 refills | Status: DC

## 2018-01-27 ENCOUNTER — Telehealth: Payer: Self-pay | Admitting: Nurse Practitioner

## 2018-01-27 NOTE — Telephone Encounter (Signed)
Spoke to [place] MA at Endocrinology office states gave patient lab order for TSH, T3, T4- can get it done anywhere. Was seen on 6/4 with plan to get labs that week. Next appointment with [facility] on 6/26 and with their office in august.   Please call patient and update her- tell her she should have lab orders from them and can get them done at any lab. If she has further questions about hand tremors or thyroid can call their office. Can come in here and get Vitamin D levels checked for lab only visit.

## 2018-01-28 NOTE — Telephone Encounter (Signed)
Left message for patient to check her information for lab slips so they can be done before the 26th of June

## 2018-01-29 ENCOUNTER — Telehealth: Payer: Self-pay | Admitting: Family Medicine

## 2018-01-29 ENCOUNTER — Telehealth: Payer: Self-pay | Admitting: Emergency Medicine

## 2018-01-29 NOTE — Telephone Encounter (Signed)
-----   Message from Fredderick Severance, NP sent at 01/23/2018  5:15 PM EDT ----- Please call endocrinologist on Monday for clarification- on hand tremors and lab orders

## 2018-01-29 NOTE — Telephone Encounter (Signed)
Spoke to [employer] That office stated that patient has lab slip. Patient notified to come by and do labs for appointment in 26 June

## 2018-01-29 NOTE — Telephone Encounter (Signed)
Patient came in wanting to have her labs done that [facility] and the Endocrinologist ordered.  Informed patient that Benjamine Mola would need to order the endo. Labs in order to have those done at our office.  Patient verbalized understanding and stated that she would come back on Tuesday to have all labs done here.

## 2018-02-02 ENCOUNTER — Encounter: Payer: Self-pay | Admitting: Family Medicine

## 2018-02-03 ENCOUNTER — Other Ambulatory Visit: Payer: Self-pay | Admitting: Nurse Practitioner

## 2018-02-03 ENCOUNTER — Other Ambulatory Visit: Payer: Self-pay

## 2018-02-03 DIAGNOSIS — R946 Abnormal results of thyroid function studies: Secondary | ICD-10-CM

## 2018-02-03 DIAGNOSIS — E559 Vitamin D deficiency, unspecified: Secondary | ICD-10-CM

## 2018-02-04 LAB — T4: T4, Total: 6.5 ug/dL (ref 5.1–11.9)

## 2018-02-04 LAB — TSH: TSH: 0.02 mIU/L — ABNORMAL LOW (ref 0.40–4.50)

## 2018-02-04 LAB — VITAMIN D 25 HYDROXY (VIT D DEFICIENCY, FRACTURES): Vit D, 25-Hydroxy: 22 ng/mL — ABNORMAL LOW (ref 30–100)

## 2018-02-04 LAB — T3: T3, Total: 110 ng/dL (ref 76–181)

## 2018-02-04 NOTE — Telephone Encounter (Signed)
Patient came in for labs. 

## 2018-02-10 DIAGNOSIS — G4700 Insomnia, unspecified: Secondary | ICD-10-CM | POA: Diagnosis not present

## 2018-02-10 DIAGNOSIS — F410 Panic disorder [episodic paroxysmal anxiety] without agoraphobia: Secondary | ICD-10-CM | POA: Diagnosis not present

## 2018-02-10 DIAGNOSIS — R251 Tremor, unspecified: Secondary | ICD-10-CM | POA: Diagnosis not present

## 2018-02-10 DIAGNOSIS — E0590 Thyrotoxicosis, unspecified without thyrotoxic crisis or storm: Secondary | ICD-10-CM | POA: Diagnosis not present

## 2018-02-10 DIAGNOSIS — F419 Anxiety disorder, unspecified: Secondary | ICD-10-CM | POA: Diagnosis not present

## 2018-02-17 ENCOUNTER — Other Ambulatory Visit: Payer: Self-pay

## 2018-02-17 ENCOUNTER — Encounter: Payer: Self-pay | Admitting: Oncology

## 2018-02-17 ENCOUNTER — Inpatient Hospital Stay: Payer: Medicaid Other

## 2018-02-17 ENCOUNTER — Inpatient Hospital Stay: Payer: Medicaid Other | Attending: Oncology | Admitting: Oncology

## 2018-02-17 VITALS — BP 117/75 | HR 62 | Temp 96.8°F | Resp 18 | Wt 203.3 lb

## 2018-02-17 DIAGNOSIS — C50212 Malignant neoplasm of upper-inner quadrant of left female breast: Secondary | ICD-10-CM | POA: Diagnosis not present

## 2018-02-17 DIAGNOSIS — Z79899 Other long term (current) drug therapy: Secondary | ICD-10-CM

## 2018-02-17 DIAGNOSIS — Z79811 Long term (current) use of aromatase inhibitors: Secondary | ICD-10-CM | POA: Diagnosis not present

## 2018-02-17 DIAGNOSIS — E0590 Thyrotoxicosis, unspecified without thyrotoxic crisis or storm: Secondary | ICD-10-CM | POA: Diagnosis not present

## 2018-02-17 DIAGNOSIS — Z170 Estrogen receptor positive status [ER+]: Secondary | ICD-10-CM

## 2018-02-17 DIAGNOSIS — F411 Generalized anxiety disorder: Secondary | ICD-10-CM | POA: Diagnosis not present

## 2018-02-17 DIAGNOSIS — Z95828 Presence of other vascular implants and grafts: Secondary | ICD-10-CM

## 2018-02-17 DIAGNOSIS — Z79810 Long term (current) use of selective estrogen receptor modulators (SERMs): Secondary | ICD-10-CM

## 2018-02-17 DIAGNOSIS — H16009 Unspecified corneal ulcer, unspecified eye: Secondary | ICD-10-CM | POA: Diagnosis not present

## 2018-02-17 DIAGNOSIS — Z87891 Personal history of nicotine dependence: Secondary | ICD-10-CM

## 2018-02-17 DIAGNOSIS — E039 Hypothyroidism, unspecified: Secondary | ICD-10-CM | POA: Diagnosis not present

## 2018-02-17 LAB — CBC WITH DIFFERENTIAL/PLATELET
Basophils Absolute: 0 10*3/uL (ref 0–0.1)
Basophils Relative: 0 %
EOS PCT: 0 %
Eosinophils Absolute: 0 10*3/uL (ref 0–0.7)
HCT: 36 % (ref 35.0–47.0)
Hemoglobin: 12.4 g/dL (ref 12.0–16.0)
LYMPHS ABS: 1.1 10*3/uL (ref 1.0–3.6)
LYMPHS PCT: 15 %
MCH: 32.8 pg (ref 26.0–34.0)
MCHC: 34.4 g/dL (ref 32.0–36.0)
MCV: 95.2 fL (ref 80.0–100.0)
MONO ABS: 0.4 10*3/uL (ref 0.2–0.9)
Monocytes Relative: 5 %
Neutro Abs: 5.9 10*3/uL (ref 1.4–6.5)
Neutrophils Relative %: 80 %
PLATELETS: 294 10*3/uL (ref 150–440)
RBC: 3.78 MIL/uL — AB (ref 3.80–5.20)
RDW: 17 % — AB (ref 11.5–14.5)
WBC: 7.4 10*3/uL (ref 3.6–11.0)

## 2018-02-17 LAB — COMPREHENSIVE METABOLIC PANEL
ALBUMIN: 3.6 g/dL (ref 3.5–5.0)
ALT: 14 U/L (ref 0–44)
AST: 18 U/L (ref 15–41)
Alkaline Phosphatase: 57 U/L (ref 38–126)
Anion gap: 7 (ref 5–15)
BUN: 18 mg/dL (ref 6–20)
CHLORIDE: 108 mmol/L (ref 98–111)
CO2: 25 mmol/L (ref 22–32)
Calcium: 9.1 mg/dL (ref 8.9–10.3)
Creatinine, Ser: 0.7 mg/dL (ref 0.44–1.00)
GFR calc Af Amer: >60 mL/min (ref >60)
GFR calc non Af Amer: >60 mL/min (ref >60)
GLUCOSE: 110 mg/dL — AB (ref 70–99)
Potassium: 4.2 mmol/L (ref 3.5–5.1)
Sodium: 140 mmol/L (ref 135–145)
Total Bilirubin: 0.5 mg/dL (ref 0.3–1.2)
Total Protein: 6.6 g/dL (ref 6.5–8.1)

## 2018-02-17 MED ORDER — HEPARIN SOD (PORK) LOCK FLUSH 100 UNIT/ML IV SOLN
500.0000 [IU] | Freq: Once | INTRAVENOUS | Status: AC
  Administered 2018-02-17: 500 [IU] via INTRAVENOUS
  Filled 2018-02-17: qty 5

## 2018-02-17 MED ORDER — SODIUM CHLORIDE 0.9% FLUSH
10.0000 mL | Freq: Once | INTRAVENOUS | Status: AC
  Administered 2018-02-17: 10 mL via INTRAVENOUS
  Filled 2018-02-17: qty 10

## 2018-02-17 MED ORDER — TAMOXIFEN CITRATE 20 MG PO TABS: 20.0000 mg | ORAL_TABLET | Freq: Every day | ORAL | 6 refills | Status: DC

## 2018-02-17 NOTE — Progress Notes (Signed)
Patient here for follow up. No concerns voiced.  °

## 2018-02-17 NOTE — Progress Notes (Signed)
Hematology/Oncology Follow up note [facility] Telephone:(336) [phone] Fax:(336) [phone]   Patient Care Team: Hubbard Hartshorn, FNP as PCP - General (Family Medicine) Rico Junker, RN as Registered Nurse Theodore Demark, RN as Registered Nurse Byrnett, Forest Gleason, MD (General Surgery) Noreene Filbert, MD as Referring Physician (Radiation Oncology) Earlie Server, MD as Consulting Physician (Oncology) Shon Hough, MD as Referring Physician (Endocrinology)  REFERRING PROVIDER: Hubbard Hartshorn, FNP REASON FOR VISIT Follow up for breast cancer  HISTORY OF PRESENTING ILLNESS:  Sheena Martinez is a  57 y.o.  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 Margin is negative, no LVI. Mammoprintr revealed 10 year recurrence rate at 29% high risk group. Patient has completed MammoSite RT. Denies any hormonal replacement therapy.  Adjuvant chemotherapy with TC, finished 3 cycles.  She is perimenopausal.   # His Sjogren disease for which she has had tear duct surgery. She also reports history of PCOS with elevated testosterone level.   Genetic testing:  Genetic testing result vis [facility] showed no pathogenic sequence appearance or deletions /duplications.  # she developed corneal ulcer for which she has to undergo emergency ophthalmology surgery.  This is considered to be related to Sjogren's syndrome.  Patient has been started on prednisone by ophthalmologist.  Her anxiety has not been well controlled lately due to her ophthalmology problems.  Patient has been on Paxil for a long time and recently switched to Lexapro as Paxil interfere with efficacy of tamoxifen.  She feels her anxiety is not well controlled.  Current Treatment Adjuvant TC x3. Patient declined cycle 4.  Started on Tamoxifen in March 2019.   INTERVAL HISTORY Patient  presented for follow up for breast cancer. Patient continues on tamoxifen,  denies any significant side effects. She does have many other problems.  received during interval, she received 2 rituximab treatments.  Continue to be on steroids, on a tapering course. For her anxiety, she takes BuSpar and Zoloft, Ativan, follows up with [facility] psychiatrist Dr. Wilburn Cornelia Register.  Feels that anxiety is in control. Patient was recently seen by surgery Dr. Bary Castilla and radiation oncology Dr. Baruch Gouty Denies any fever, chills, chest pain, shortness of breath, abdominal pain, back pain, lower extremity swelling..  She takes tamoxifen 20 mg daily, tolerates well. Hypothyroidism following up with [facility] endocrinologist.  Was recommended for  radioiodide ablation versus surgery.  Reports slight hand trauma   Review of Systems  Constitutional: Negative for chills, fever, malaise/fatigue and weight loss.  HENT: Negative for congestion, ear discharge, ear pain, hearing loss, nosebleeds, sinus pain and sore throat.   Eyes: Negative for double vision, photophobia, pain, discharge and redness.  Respiratory: Negative for cough, hemoptysis, sputum production, shortness of breath and wheezing.   Cardiovascular: Negative for chest pain, palpitations, orthopnea, claudication and leg swelling.  Gastrointestinal: Negative for abdominal pain, blood in stool, constipation, diarrhea, heartburn, melena, nausea and vomiting.  Genitourinary: Negative for dysuria, flank pain, frequency, hematuria and urgency.  Musculoskeletal: Negative for back pain, joint pain, myalgias and neck pain.  Skin: Negative for itching and rash.  Neurological: Positive for tremors. Negative for dizziness, tingling, sensory change, focal weakness, seizures, weakness and headaches.  Endo/Heme/Allergies: Negative for environmental allergies. Does not bruise/bleed easily.  Psychiatric/Behavioral: Negative for depression, hallucinations, memory loss and suicidal ideas. The patient is not nervous/anxious.    MEDICAL HISTORY:  Past  Medical History:  Diagnosis Date  . Anxiety   . Arthritis   .  Breast cancer ([place]) 05/12/2017   INVASIVE MAMMARY CARCINOMA ER/PR positive LEFT BREAST UPPER inner  QUAD  . GERD (gastroesophageal reflux disease)    OCC  . Goiter   . Hyperthyroidism   . Sjogren's syndrome ([place])     SURGICAL HISTORY: Past Surgical History:  Procedure Laterality Date  . BREAST BIOPSY Left 05/12/2017   [place] bx/ INVASIVE MAMMARY CARCINOMA  . BREAST LUMPECTOMY WITH SENTINEL LYMPH NODE BIOPSY Left 06/10/2017   Procedure: BREAST LUMPECTOMY WITH SENTINEL LYMPH NODE BX AND NEEDLE LOCALIZATION;  Surgeon: Robert Bellow, MD;  Location: [facility] ORS;  Service: General;  Laterality: Left;  . BREAST MAMMOSITE Left 06/24/2017   Procedure: MAMMOSITE BREAST;  Surgeon: Robert Bellow, MD;  Location: [facility] ORS;  Service: General;  Laterality: Left;  . CESAREAN SECTION  1995  . COSMETIC SURGERY    . CYST EXCISION  2018   pilar cyst/ Dr Will Bonnet  . PORTACATH PLACEMENT Right 07/08/2017   Procedure: INSERTION PORT-A-CATH;  Surgeon: Robert Bellow, MD;  Location: [facility] ORS;  Service: General;  Laterality: Right;  . TONSILLECTOMY      SOCIAL HISTORY: Social History   Socioeconomic History  . Marital status: Married    Spouse name: Not on file  . Number of children: Not on file  . Years of education: Not on file  . Highest education level: Not on file  Occupational History  . Not on file  Social Needs  . Financial resource strain: Not on file  . Food insecurity:    Worry: Never true    Inability: Never true  . Transportation needs:    Medical: No    Non-medical: No  Tobacco Use  . Smoking status: Former Smoker    Years: 7.00    Types: Cigarettes    Last attempt to quit: 08/13/1979    Years since quitting: 38.5  . Smokeless tobacco: Never Used  . Tobacco comment: AGE [age]  Substance and Sexual Activity  . Alcohol use: No  . Drug use: No  . Sexual activity: Yes  Lifestyle  . Physical activity:     Days per week: Not on file    Minutes per session: Not on file  . Stress: Not on file  Relationships  . Social connections:    Talks on phone: Not on file    Gets together: Not on file    Attends religious service: Not on file    Active member of club or organization: Not on file    Attends meetings of clubs or organizations: Not on file    Relationship status: Not on file  . Intimate partner violence:    Fear of current or ex partner: Not on file    Emotionally abused: Not on file    Physically abused: Not on file    Forced sexual activity: Not on file  Other Topics Concern  . Not on file  Social History Narrative  . Not on file    FAMILY HISTORY: Family History  Problem Relation Age of Onset  . Breast cancer Maternal Aunt 15       currently ~65  . Diabetes Mother   . Skin cancer Mother        currently 46; TAH/BSO (age?)  . Anemia Mother   . Liver disease Father        deceased / not much info about him / alcoholic  . Lung cancer Paternal Aunt        smoker / deceased 40s  .  Stomach cancer Paternal Uncle 9       deceased / deceased 59s  . Ovarian cancer Maternal Grandmother 37       deceased 67s  . Stomach cancer Paternal Grandfather 65       deceased 20s  . Cancer Maternal Uncle        unk. type; deceased 33s  . Leukemia Cousin 66       deceased 64    ALLERGIES:  is allergic to klonopin [clonazepam] and sulfamethoxazole-trimethoprim.  MEDICATIONS:  Current Outpatient Medications  Medication Sig Dispense Refill  . acetaminophen (TYLENOL) 500 MG tablet Take 1,000 mg by mouth every 8 (eight) hours as needed for mild pain or headache.    . busPIRone (BUSPAR) 7.5 MG tablet Take by mouth.    . Ca Carbonate-Mag Hydroxide (ROLAIDS PO) Take 1 tablet as needed by mouth (indigestion).     . carboxymethylcellulose 1 % ophthalmic solution Place 1 drop into the left eye 4 (four) times daily.    Marland Kitchen ketoconazole (NIZORAL) 2 % shampoo Apply 1 application topically 2 (two)  times a week.    . lidocaine-prilocaine (EMLA) cream Apply to affected area once 30 g 3  . LORazepam (ATIVAN) 0.5 MG tablet   0  . meloxicam (MOBIC) 7.5 MG tablet Take 1 tablet (7.5 mg total) by mouth at bedtime. 30 tablet 2  . methimazole (TAPAZOLE) 10 MG tablet TAKE 1 TABLET BY MOUTH ONCE DAILY AT 6AM  2  . moxifloxacin (VIGAMOX) 0.5 % ophthalmic solution Place 1 drop into the left eye 4 (four) times daily.    . prednisoLONE acetate (PRED FORTE) 1 % ophthalmic suspension   1  . predniSONE (DELTASONE) 10 MG tablet Take 40 mg by mouth daily.  0  . sertraline (ZOLOFT) 100 MG tablet Take 100 mg by mouth daily.  1  . sertraline (ZOLOFT) 50 MG tablet Take 50 mg by mouth daily.   0  . tamoxifen (NOLVADEX) 20 MG tablet Take 1 tablet (20 mg total) by mouth daily. 30 tablet 6  . Vitamin D, Ergocalciferol, (DRISDOL) 50000 units CAPS capsule Take 1 capsule (50,000 Units total) by mouth every 7 (seven) days. Once completed, take 1000IU daily OTC 8 capsule 0  . Vitamin D, Ergocalciferol, (DRISDOL) 50000 units CAPS capsule Take by mouth.     No current facility-administered medications for this visit.    Facility-Administered Medications Ordered in Other Visits  Medication Dose Route Frequency Provider Last Rate Last Dose  . heparin lock flush 100 unit/mL  500 Units Intravenous Once Earlie Server, MD      . sodium chloride flush (NS) 0.9 % injection 10 mL  10 mL Intravenous PRN Earlie Server, MD   10 mL at 10/01/17 0803  . sodium chloride flush (NS) 0.9 % injection 10 mL  10 mL Intravenous Once Earlie Server, MD          .  PHYSICAL EXAMINATION: ECOG PERFORMANCE STATUS: 0 - Asymptomatic Vitals:   02/17/18 1031  BP: 117/75  Pulse: 62  Resp: 18  Temp: (!) 96.8 F (36 C)   Physical Exam  Constitutional: She is oriented to person, place, and time and well-developed, well-nourished, and in no distress. No distress.  HENT:  Head: Normocephalic and atraumatic.  Mouth/Throat: No oropharyngeal exudate.  Eyes:  Pupils are equal, round, and reactive to light. Conjunctivae and EOM are normal. No scleral icterus.  Neck: Normal range of motion. Neck supple. No thyromegaly present.  Cardiovascular: Normal rate, regular rhythm  and normal heart sounds.  No murmur heard. Pulmonary/Chest: Effort normal and breath sounds normal. No respiratory distress. She has no wheezes.  Abdominal: Soft. Bowel sounds are normal. She exhibits no distension.  Musculoskeletal: Normal range of motion. She exhibits no edema.  Lymphadenopathy:    She has no cervical adenopathy.  Neurological: She is alert and oriented to person, place, and time. No cranial nerve deficit. Coordination normal.  Skin: Skin is dry. No rash noted. No erythema.  Psychiatric: Affect normal.   LABORATORY DATA:  I have reviewed the data as listed: no recent labs.  Lab Results  Component Value Date   WBC 4.1 11/18/2017   HGB 13.2 11/18/2017   HCT 38.5 11/18/2017   MCV 91.8 11/18/2017   PLT 238 11/18/2017   Recent Labs    10/01/17 0803 10/13/17 1543 11/18/17 1029  NA 138 135 140  K 3.6 3.6 3.5  CL 106 104 108  CO2 '27 23 23  '$ GLUCOSE 140* 119* 113*  BUN 17 24* 16  CREATININE 0.58 0.61 0.73  CALCIUM 8.8* 9.1 9.1  GFRNONAA >60 >60 >60  GFRAA >60 >60 >60  PROT 6.3* 7.1 6.1*  ALBUMIN 3.3* 3.8 3.3*  AST '29 27 24  '$ ALT '18 25 21  '$ ALKPHOS 82 83 51  BILITOT 0.7 1.0 0.7       ASSESSMENT & PLAN:  57 yo premenopausal female with Stage 1A breast cancer, high risk mammaprint index currently on adjuvant chemotherapy with TC. Cancer Staging Malignant neoplasm of upper-inner quadrant of left breast in female, estrogen receptor positive ([place]) Staging form: Breast, AJCC 8th Edition - Clinical stage from 05/22/2017: Stage IA (cT1, cN0, cM0, G1, ER: Positive, PR: Positive, HER2: Negative) - Signed by Earlie Server, MD on 05/22/2017 - Pathologic stage from 07/09/2017: Stage IA (pT1b, pN0, cM0, G2, ER: Positive, PR: Positive, HER2: Negative) - Signed by  Earlie Server, MD on 07/30/2017  1. Long-term current use of tamoxifen   2. Malignant neoplasm of upper-inner quadrant of left breast in female, estrogen receptor positive ([place])   3. Generalized anxiety disorder   4. Hyperthyroidism   5. Port-A-Cath in place     # Breast cancer, tolerating tamoxifen without significant side effects. She recently was seen by surgery and radiation oncology and has had breast examinations. She is due for annual mammogram in October 2019, will be scheduled through Dr. Dwyane Luo office Continue Tamoxifen '20mg'$  daily.    # Anxiety: continue follow up with Dr.Shelby Register at [facility]. Previously discussed concerns of Zoloft and she feels this is a mild inhibitor and minimally interact with Tamoxifen level.  # Hyperthyroidism: needs radio iodine ablation or surgery. Encourage patient to discuss with her endocrinologist.  # She needs ongoing dental work which was done due to multiple other heath issues. Dental clearance not received for starting Zometa.  I discussed with patient about the rationale and side effects of using Zometa Adjuvantly. She will consider and if decides to proceed with Zometa, she will obtain dental clearance and notify our office.   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 S/p 2 rituximab treatment, urrently on prednisone tapering course.   # Medi port: was flushed today.  Patient prefers to keep her port for another 6 months as she may use it for additional Rituximab.  Schedule patient to have port flush every 8 weeks till end of this year.  Will discuss again if she wants to take port out at that time.   #  Patient has many questions and I answered all to her satisfaction.  # check cbc, cmp today. Will eventually synchronize her appoints to be alternated between her surgeon and me.  Return of visit: 6 months  Total face to face encounter time for this patient visit was 40 min. >50% of the time was  spent in  counseling and coordination of care.    Earlie Server, MD, PhD Hematology Oncology [facility] at [facility] Pager- [phone] 02/17/2018

## 2018-03-02 ENCOUNTER — Encounter: Payer: Self-pay | Admitting: Obstetrics & Gynecology

## 2018-03-02 ENCOUNTER — Other Ambulatory Visit (HOSPITAL_COMMUNITY)
Admission: RE | Admit: 2018-03-02 | Discharge: 2018-03-02 | Disposition: A | Discharge status: Home / Self Care | Payer: Medicaid Other | Source: Ambulatory Visit | Attending: Obstetrics & Gynecology | Admitting: Obstetrics & Gynecology

## 2018-03-02 ENCOUNTER — Ambulatory Visit (INDEPENDENT_AMBULATORY_CARE_PROVIDER_SITE_OTHER): Payer: Medicaid Other | Admitting: Obstetrics & Gynecology

## 2018-03-02 VITALS — BP 130/80 | Ht 60.0 in | Wt 202.0 lb

## 2018-03-02 DIAGNOSIS — Z124 Encounter for screening for malignant neoplasm of cervix: Secondary | ICD-10-CM | POA: Insufficient documentation

## 2018-03-02 DIAGNOSIS — N926 Irregular menstruation, unspecified: Secondary | ICD-10-CM | POA: Insufficient documentation

## 2018-03-02 DIAGNOSIS — Z79810 Long term (current) use of selective estrogen receptor modulators (SERMs): Secondary | ICD-10-CM | POA: Insufficient documentation

## 2018-03-02 DIAGNOSIS — N858 Other specified noninflammatory disorders of uterus: Secondary | ICD-10-CM | POA: Diagnosis not present

## 2018-03-02 DIAGNOSIS — Z01419 Encounter for gynecological examination (general) (routine) without abnormal findings: Secondary | ICD-10-CM | POA: Diagnosis not present

## 2018-03-02 NOTE — Progress Notes (Signed)
HPI:      Sheena Martinez is a 57 y.o. [phone] who LMP was in the past, she presents today for consultation surrounding irreg menses and recent start of TAMOXIFEN. The patient has periods every 28-45 days, varying in length and amount.  Has hot flashes, mild.  Anxiety.  The patient is sexually active. Herlast pap: approximate date 3 years ago and was normal.  The patient does perform self breast exams.  There is notable family history of breast or ovarian cancer in her family. The patient is not taking hormone replacement therapy. Breast Cancer dx NOV 2018, s/p surgery and RT and chemo.   The patient has regular exercise: yes. The patient denies current symptoms of depression.  Also recent dx Hyperthy/ Goitre.  GYN Hx: Last Colonoscopy:never ago. Normal COLOGUARD 2019 Last DEXA: never ago.    PMHx: Past Medical History:  Diagnosis Date  . Anxiety   . Arthritis   . Breast cancer ([place]) 05/12/2017   INVASIVE MAMMARY CARCINOMA ER/PR positive LEFT BREAST UPPER inner  QUAD  . GERD (gastroesophageal reflux disease)    OCC  . Goiter   . Hyperthyroidism   . Sjogren's syndrome [facility])    Past Surgical History:  Procedure Laterality Date  . BREAST BIOPSY Left 05/12/2017   [place] bx/ INVASIVE MAMMARY CARCINOMA  . BREAST LUMPECTOMY WITH SENTINEL LYMPH NODE BIOPSY Left 06/10/2017   Procedure: BREAST LUMPECTOMY WITH SENTINEL LYMPH NODE BX AND NEEDLE LOCALIZATION;  Surgeon: Lean Jaeger Bellow, MD;  Location: [facility] ORS;  Service: General;  Laterality: Left;  . BREAST MAMMOSITE Left 06/24/2017   Procedure: MAMMOSITE BREAST;  Surgeon: Kinze Labo Bellow, MD;  Location: [facility] ORS;  Service: General;  Laterality: Left;  . CESAREAN SECTION  1995  . COSMETIC SURGERY    . CYST EXCISION  2018   pilar cyst/ Dr Will Bonnet  . EYE SURGERY    . PORTACATH PLACEMENT Right 07/08/2017   Procedure: INSERTION PORT-A-CATH;  Surgeon: Tawfiq Favila Bellow, MD;  Location: [facility] ORS;  Service: General;  Laterality: Right;  .  TONSILLECTOMY     Family History  Problem Relation Age of Onset  . Breast cancer Maternal Aunt 53       currently ~65  . Diabetes Mother   . Skin cancer Mother        currently 7; TAH/BSO (age?)  . Anemia Mother   . Liver disease Father        deceased / not much info about him / alcoholic  . Lung cancer Paternal Aunt        smoker / deceased 69s  . Stomach cancer Paternal Uncle 81       deceased / deceased 79s  . Ovarian cancer Maternal Grandmother 18       deceased 50s  . Stomach cancer Paternal Grandfather 42       deceased 28s  . Cancer Maternal Uncle        unk. type; deceased 59s  . Leukemia Cousin 2       deceased 71   Social History   Tobacco Use  . Smoking status: Former Smoker    Years: 7.00    Types: Cigarettes    Last attempt to quit: 08/13/1979    Years since quitting: 38.5  . Smokeless tobacco: Never Used  . Tobacco comment: AGE [age]  Substance Use Topics  . Alcohol use: No  . Drug use: No    Current Outpatient Medications:  .  acetaminophen (TYLENOL) 500 MG  tablet, Take 1,000 mg by mouth every 8 (eight) hours as needed for mild pain or headache., Disp: , Rfl:  .  busPIRone (BUSPAR) 7.5 MG tablet, Take by mouth., Disp: , Rfl:  .  Ca Carbonate-Mag Hydroxide (ROLAIDS PO), Take 1 tablet as needed by mouth (indigestion). , Disp: , Rfl:  .  carboxymethylcellulose 1 % ophthalmic solution, Place 1 drop into the left eye 4 (four) times daily., Disp: , Rfl:  .  cholecalciferol (VITAMIN D) 1000 units tablet, Take 2,000 Units by mouth daily., Disp: , Rfl:  .  ketoconazole (NIZORAL) 2 % shampoo, Apply 1 application topically 2 (two) times a week., Disp: , Rfl:  .  lidocaine-prilocaine (EMLA) cream, Apply to affected area once, Disp: 30 g, Rfl: 3 .  LORazepam (ATIVAN) 0.5 MG tablet, , Disp: , Rfl: 0 .  meloxicam (MOBIC) 7.5 MG tablet, Take 1 tablet (7.5 mg total) by mouth at bedtime., Disp: 30 tablet, Rfl: 2 .  methimazole (TAPAZOLE) 10 MG tablet, TAKE 1 TABLET BY  MOUTH ONCE DAILY AT 6AM, Disp: , Rfl: 2 .  moxifloxacin (VIGAMOX) 0.5 % ophthalmic solution, Place 1 drop into the left eye 4 (four) times daily., Disp: , Rfl:  .  prednisoLONE acetate (PRED FORTE) 1 % ophthalmic suspension, , Disp: , Rfl: 1 .  predniSONE (DELTASONE) 10 MG tablet, Take 40 mg by mouth daily., Disp: , Rfl: 0 .  sertraline (ZOLOFT) 100 MG tablet, Take 100 mg by mouth daily., Disp: , Rfl: 1 .  sertraline (ZOLOFT) 50 MG tablet, Take 50 mg by mouth daily. , Disp: , Rfl: 0 .  tamoxifen (NOLVADEX) 20 MG tablet, Take 1 tablet (20 mg total) by mouth daily., Disp: 30 tablet, Rfl: 6 .  traZODone (DESYREL) 50 MG tablet, Take 50 mg by mouth at bedtime., Disp: , Rfl: 1 .  vitamin C (ASCORBIC ACID) 500 MG tablet, Take 500 mg by mouth daily., Disp: , Rfl:  .  Vitamin D, Ergocalciferol, (DRISDOL) 50000 units CAPS capsule, Take 1 capsule (50,000 Units total) by mouth every 7 (seven) days. Once completed, take 1000IU daily OTC, Disp: 8 capsule, Rfl: 0 .  vitamin k 100 MCG tablet, Take 100 mcg by mouth 2 (two) times a week., Disp: , Rfl:  .  Vitamin D, Ergocalciferol, (DRISDOL) 50000 units CAPS capsule, Take by mouth., Disp: , Rfl:  No current facility-administered medications for this visit.   Facility-Administered Medications Ordered in Other Visits:  .  sodium chloride flush (NS) 0.9 % injection 10 mL, 10 mL, Intravenous, PRN, Earlie Server, MD, 10 mL at 10/01/17 0803 Allergies: Klonopin [clonazepam] and Sulfamethoxazole-trimethoprim  Review of Systems  Constitutional: Negative for chills, fever and malaise/fatigue.  HENT: Negative for congestion, sinus pain and sore throat.   Eyes: Negative for blurred vision and pain.  Respiratory: Negative for cough and wheezing.   Cardiovascular: Negative for chest pain and leg swelling.  Gastrointestinal: Negative for abdominal pain, constipation, diarrhea, heartburn, nausea and vomiting.  Genitourinary: Negative for dysuria, frequency, hematuria and urgency.   Musculoskeletal: Positive for joint pain. Negative for back pain, myalgias and neck pain.  Skin: Negative for itching and rash.  Neurological: Negative for dizziness, tremors and weakness.  Endo/Heme/Allergies: Does not bruise/bleed easily.  Psychiatric/Behavioral: Negative for depression. The patient is nervous/anxious. The patient does not have insomnia.     Objective: BP 130/80   Ht 5' (1.524 m)   Wt 202 lb (91.6 kg)   BMI 39.45 kg/m   [employer]   03/02/18  1403  Weight: 202 lb (91.6 kg)   Body mass index is 39.45 kg/m. Physical Exam  Constitutional: She is oriented to person, place, and time. She appears well-developed and well-nourished. No distress.  Genitourinary: Rectum normal, vagina normal and uterus normal. Pelvic exam was performed with patient supine. There is no rash or lesion on the right labia. There is no rash or lesion on the left labia. Vagina exhibits no lesion. No bleeding in the vagina. Right adnexum does not display mass and does not display tenderness. Left adnexum does not display mass and does not display tenderness. Cervix does not exhibit motion tenderness, lesion, friability or polyp.   Uterus is mobile and midaxial. Uterus is not enlarged or exhibiting a mass.  HENT:  Head: Normocephalic and atraumatic. Head is without laceration.  Right Ear: Hearing normal.  Left Ear: Hearing normal.  Nose: No epistaxis.  No foreign bodies.  Mouth/Throat: Uvula is midline, oropharynx is clear and moist and mucous membranes are normal.  Eyes: Pupils are equal, round, and reactive to light.  Neck: Normal range of motion. Neck supple. No thyromegaly present.  Cardiovascular: Normal rate and regular rhythm. Exam reveals no gallop and no friction rub.  No murmur heard. Pulmonary/Chest: Effort normal. Left breast exhibits no skin change.  Abdominal: Soft. Bowel sounds are normal. She exhibits no distension. There is no tenderness. There is no rebound.  Musculoskeletal:  Normal range of motion.  Neurological: She is alert and oriented to person, place, and time. No cranial nerve deficit.  Skin: Skin is warm and dry.  Psychiatric: She has a normal mood and affect. Judgment normal.  Vitals reviewed.  Endometrial Biopsy After discussion with the patient regarding her abnormal uterine bleeding I recommended that she proceed with an endometrial biopsy for further diagnosis. The risks, benefits, alternatives, and indications for an endometrial biopsy were discussed with the patient in detail. She understood the risks including infection, bleeding, cervical laceration and uterine perforation.  Verbal consent was obtained.   PROCEDURE NOTE:  [place] endometrial biopsy was performed using aseptic technique with iodine preparation.  The uterus was sounded to a length of 7 cm.  Adequate sampling was obtained with minimal blood loss.  The patient tolerated the procedure well.  Disposition will be pending pathology.  Assessment: Annual Exam 1. Long-term current use of tamoxifen   2. Irregular menstrual cycle   3. Screening for cervical cancer    Plan:            1.  Cervical Screening-  Pap smear done today  2. Breast screening- [place] f/u  3. Colonoscopy vs Cologuard discussed  4. Labs managed by PCP  5. Counseling for hormonal therapy: none, no change in therapy today and should never use hormones. Alternative tx for vasomotor sx's discussed as needed  6. Tamoxifen use, monitor bleeding and use EMB to help assess risks for endometrial cancer.  As she is still having cycles, difficult to tell so EMB should be considered.      F/U  Return in about 1 year (around 03/03/2019) for Annual.  Barnett Applebaum, MD, [facility], [facility] 03/02/2018  2:47 PM

## 2018-03-02 NOTE — Patient Instructions (Signed)

## 2018-03-03 LAB — CYTOLOGY - PAP
Diagnosis: NEGATIVE
HPV: NOT DETECTED

## 2018-03-15 DIAGNOSIS — H539 Unspecified visual disturbance: Secondary | ICD-10-CM | POA: Diagnosis not present

## 2018-03-17 ENCOUNTER — Other Ambulatory Visit: Payer: Self-pay

## 2018-03-17 DIAGNOSIS — Z170 Estrogen receptor positive status [ER+]: Secondary | ICD-10-CM

## 2018-03-17 DIAGNOSIS — C50212 Malignant neoplasm of upper-inner quadrant of left female breast: Secondary | ICD-10-CM

## 2018-03-21 ENCOUNTER — Encounter: Payer: Self-pay | Admitting: Family Medicine

## 2018-03-25 ENCOUNTER — Telehealth: Payer: Self-pay | Admitting: Emergency Medicine

## 2018-03-25 NOTE — Telephone Encounter (Signed)
Copied from [place] [phone]. Topic: General - Other >> Mar 25, 2018  9:05 AM Marin Olp L wrote: Reason for CRM: See mychart from 08/10 requesting lab orders for thyroid test. No orders placed yet. Please advise.

## 2018-03-25 NOTE — Telephone Encounter (Signed)
As per last visit with Sheena Mola NP, we will not be ordering her thyroid tests. She needs to obtain these from her endocrinologist, and if she would like our phlebotomist to draw her labs, she is more than welcome to bring the lab requisition forms in for lab draw.  Please explain that this allows for consistency in which provider is receiving her results and managing her conditions.

## 2018-03-25 NOTE — Telephone Encounter (Signed)
Please advise, Are you going to continue to give orders for Thyroid to be check .

## 2018-03-25 NOTE — Telephone Encounter (Signed)
Copied from [facility] [id number]. Topic: General - Other >> Mar 25, 2018  9:05 AM Sheena Martinez wrote: Reason for CRM: See mychart from 08/10 requesting lab orders for thyroid test. No orders placed yet. Please advise.

## 2018-03-26 DIAGNOSIS — E0590 Thyrotoxicosis, unspecified without thyrotoxic crisis or storm: Secondary | ICD-10-CM | POA: Diagnosis not present

## 2018-03-27 NOTE — Telephone Encounter (Signed)
The call center spoke with the patient yesterday afternoon

## 2018-03-31 DIAGNOSIS — E0520 Thyrotoxicosis with toxic multinodular goiter without thyrotoxic crisis or storm: Secondary | ICD-10-CM | POA: Diagnosis not present

## 2018-04-01 DIAGNOSIS — H5789 Other specified disorders of eye and adnexa: Secondary | ICD-10-CM | POA: Diagnosis not present

## 2018-04-01 DIAGNOSIS — Z947 Corneal transplant status: Secondary | ICD-10-CM | POA: Diagnosis not present

## 2018-04-01 DIAGNOSIS — H04202 Unspecified epiphora, left lacrimal gland: Secondary | ICD-10-CM | POA: Diagnosis not present

## 2018-04-10 DIAGNOSIS — H16002 Unspecified corneal ulcer, left eye: Secondary | ICD-10-CM | POA: Diagnosis not present

## 2018-04-10 DIAGNOSIS — Z947 Corneal transplant status: Secondary | ICD-10-CM | POA: Diagnosis not present

## 2018-04-14 ENCOUNTER — Inpatient Hospital Stay: Payer: Medicaid Other | Attending: Oncology

## 2018-04-14 DIAGNOSIS — Z95828 Presence of other vascular implants and grafts: Secondary | ICD-10-CM

## 2018-04-14 DIAGNOSIS — C50212 Malignant neoplasm of upper-inner quadrant of left female breast: Secondary | ICD-10-CM | POA: Insufficient documentation

## 2018-04-14 DIAGNOSIS — F419 Anxiety disorder, unspecified: Secondary | ICD-10-CM | POA: Diagnosis not present

## 2018-04-14 DIAGNOSIS — F410 Panic disorder [episodic paroxysmal anxiety] without agoraphobia: Secondary | ICD-10-CM | POA: Diagnosis not present

## 2018-04-14 DIAGNOSIS — R251 Tremor, unspecified: Secondary | ICD-10-CM | POA: Diagnosis not present

## 2018-04-14 DIAGNOSIS — G4700 Insomnia, unspecified: Secondary | ICD-10-CM | POA: Diagnosis not present

## 2018-04-14 MED ORDER — HEPARIN SOD (PORK) LOCK FLUSH 100 UNIT/ML IV SOLN
500.0000 [IU] | Freq: Once | INTRAVENOUS | Status: AC
  Administered 2018-04-14: 500 [IU] via INTRAVENOUS

## 2018-04-14 MED ORDER — SODIUM CHLORIDE 0.9% FLUSH
10.0000 mL | Freq: Once | INTRAVENOUS | Status: AC
  Administered 2018-04-14: 10 mL via INTRAVENOUS
  Filled 2018-04-14: qty 10

## 2018-04-17 DIAGNOSIS — H16002 Unspecified corneal ulcer, left eye: Secondary | ICD-10-CM | POA: Diagnosis not present

## 2018-04-17 DIAGNOSIS — Z947 Corneal transplant status: Secondary | ICD-10-CM | POA: Diagnosis not present

## 2018-04-20 DIAGNOSIS — M3501 Sicca syndrome with keratoconjunctivitis: Secondary | ICD-10-CM | POA: Diagnosis not present

## 2018-04-20 DIAGNOSIS — H16002 Unspecified corneal ulcer, left eye: Secondary | ICD-10-CM | POA: Diagnosis not present

## 2018-05-01 ENCOUNTER — Other Ambulatory Visit: Payer: Self-pay

## 2018-05-01 DIAGNOSIS — H16002 Unspecified corneal ulcer, left eye: Secondary | ICD-10-CM | POA: Diagnosis not present

## 2018-05-01 DIAGNOSIS — Z947 Corneal transplant status: Secondary | ICD-10-CM | POA: Diagnosis not present

## 2018-05-04 ENCOUNTER — Ambulatory Visit
Admission: RE | Admit: 2018-05-04 | Discharge: 2018-05-04 | Disposition: A | Discharge status: Home / Self Care | Payer: Medicaid Other | Source: Ambulatory Visit | Attending: General Surgery | Admitting: General Surgery

## 2018-05-04 DIAGNOSIS — Z170 Estrogen receptor positive status [ER+]: Secondary | ICD-10-CM

## 2018-05-04 DIAGNOSIS — C50212 Malignant neoplasm of upper-inner quadrant of left female breast: Secondary | ICD-10-CM

## 2018-05-04 DIAGNOSIS — R928 Other abnormal and inconclusive findings on diagnostic imaging of breast: Secondary | ICD-10-CM | POA: Diagnosis not present

## 2018-05-04 DIAGNOSIS — Z853 Personal history of malignant neoplasm of breast: Secondary | ICD-10-CM | POA: Diagnosis not present

## 2018-05-04 HISTORY — DX: Malignant neoplasm of colon, unspecified: C18.9

## 2018-05-04 HISTORY — DX: Personal history of irradiation: Z92.3

## 2018-05-08 ENCOUNTER — Other Ambulatory Visit: Payer: Self-pay | Admitting: Nurse Practitioner

## 2018-05-08 DIAGNOSIS — M170 Bilateral primary osteoarthritis of knee: Secondary | ICD-10-CM

## 2018-05-13 DIAGNOSIS — Z947 Corneal transplant status: Secondary | ICD-10-CM | POA: Diagnosis not present

## 2018-05-13 DIAGNOSIS — H16002 Unspecified corneal ulcer, left eye: Secondary | ICD-10-CM | POA: Diagnosis not present

## 2018-05-19 ENCOUNTER — Ambulatory Visit: Payer: Medicaid Other | Admitting: General Surgery

## 2018-05-19 ENCOUNTER — Encounter: Payer: Self-pay | Admitting: General Surgery

## 2018-05-19 VITALS — BP 122/74 | HR 68 | Resp 12 | Ht 60.0 in | Wt 206.0 lb

## 2018-05-19 DIAGNOSIS — Z170 Estrogen receptor positive status [ER+]: Secondary | ICD-10-CM | POA: Diagnosis not present

## 2018-05-19 DIAGNOSIS — C50212 Malignant neoplasm of upper-inner quadrant of left female breast: Secondary | ICD-10-CM

## 2018-05-19 NOTE — Patient Instructions (Addendum)
The patient is aware to call back for any questions or new concerns. The patient has been asked to return to the office in one year with a bilateral diagnostic mammogram.   Remove port when possible.

## 2018-05-19 NOTE — Progress Notes (Signed)
Patient ID: Sheena Martinez, female   DOB: 11-03-1960, 57 y.o.   MRN: [id number]  Chief Complaint  Patient presents with  . Follow-up    HPI Sheena Martinez is a 57 y.o. female.  who presents for her left breast cancer follow up and a breast evaluation. The most recent mammogram was done on 05-04-18.  Patient does not perform regular self breast checks and gets regular mammograms done.  Tolerating tamoxifen. No new breast issues. She is still having eye troubles and is on prednisone 10 mg taper.   HPI  Past Medical History:  Diagnosis Date  . Anxiety   . Arthritis   . 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 Colon cancer ([place])   . GERD (gastroesophageal reflux disease)    OCC  . Goiter   . Hyperthyroidism   . Personal history of radiation therapy   . Sjogren's syndrome [facility])     Past Surgical History:  Procedure Laterality Date  . BREAST BIOPSY Left 05/12/2017   [place] bx/ INVASIVE MAMMARY CARCINOMA  . BREAST LUMPECTOMY Left 05/2017   invasive mammary carcinoma, neg margins  . BREAST LUMPECTOMY WITH SENTINEL LYMPH NODE BIOPSY Left 06/10/2017   Procedure: BREAST LUMPECTOMY WITH SENTINEL LYMPH NODE BX AND NEEDLE LOCALIZATION;  Surgeon: Robert Bellow, MD;  Location: [facility] ORS;  Service: General;  Laterality: Left;  . BREAST MAMMOSITE Left 06/24/2017   Procedure: MAMMOSITE BREAST;  Surgeon: Robert Bellow, MD;  Location: [facility] ORS;  Service: General;  Laterality: Left;  . CESAREAN SECTION  1995  . 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 COSMETIC SURGERY    . CYST EXCISION  2018   pilar cyst/ Dr Will Bonnet  . EYE SURGERY    . PORTACATH PLACEMENT Right 07/08/2017   Procedure: INSERTION PORT-A-CATH;  Surgeon: Robert Bellow, MD;  Location: [facility] ORS;  Service: General;  Laterality: Right;  . TONSILLECTOMY      Family History  Problem Relation Age of Onset  . Breast cancer Maternal Aunt 57       currently ~65   . Diabetes Mother   . Skin cancer Mother        currently 70; TAH/BSO (age?)  . Anemia Mother   . Liver disease Father        deceased / not much info about him / alcoholic  . Lung cancer Paternal Aunt        smoker / deceased 41s  . Stomach cancer Paternal Uncle 68       deceased / deceased 55s  . Ovarian cancer Maternal Grandmother 35       deceased 34s  . Stomach cancer Paternal Grandfather 50       deceased 87s  . Cancer Maternal Uncle        unk. type; deceased 7s  . Leukemia Cousin 57       deceased 44    Social History Social History   Tobacco Use  . Smoking status: Former Smoker    Years: 7.00    Types: Cigarettes    Last attempt to quit: 08/13/1979    Years since quitting: 38.7  . Smokeless tobacco: Never Used  . Tobacco comment: AGE [age]  Substance Use Topics  . Alcohol use: No  . Drug use: No    Allergies  Allergen Reactions  . Klonopin [Clonazepam]   . Sulfamethoxazole-Trimethoprim Rash    Chest tightness. Bactrim  Current Outpatient Medications  Medication Sig Dispense Refill  . acetaminophen (TYLENOL) 500 MG tablet Take 1,000 mg by mouth every 8 (eight) hours as needed for mild pain or headache.    . busPIRone (BUSPAR) 7.5 MG tablet Take by mouth.    . Ca Carbonate-Mag Hydroxide (ROLAIDS PO) Take 1 tablet as needed by mouth (indigestion).     . carboxymethylcellulose 1 % ophthalmic solution Place 1 drop into the left eye 4 (four) times daily.    . cholecalciferol (VITAMIN D) 1000 units tablet Take 2,000 Units by mouth daily.    Marland Kitchen ketoconazole (NIZORAL) 2 % shampoo Apply 1 application topically 2 (two) times a week.    Marland Kitchen LORazepam (ATIVAN) 0.5 MG tablet   0  . meloxicam (MOBIC) 7.5 MG tablet TAKE 1 TABLET BY MOUTH AT BEDTIME 90 tablet 0  . methimazole (TAPAZOLE) 10 MG tablet TAKE 1 TABLET BY MOUTH ONCE DAILY AT 6AM  2  . prednisoLONE acetate (PRED FORTE) 1 % ophthalmic suspension   1  . predniSONE (DELTASONE) 10 MG tablet Take 10 mg by mouth  daily.   0  . sertraline (ZOLOFT) 100 MG tablet Take 100 mg by mouth daily.  1  . tamoxifen (NOLVADEX) 20 MG tablet Take 1 tablet (20 mg total) by mouth daily. 30 tablet 6  . vitamin C (ASCORBIC ACID) 500 MG tablet Take 500 mg by mouth daily.    . Vitamin D, Ergocalciferol, (DRISDOL) 50000 units CAPS capsule Take 1 capsule (50,000 Units total) by mouth every 7 (seven) days. Once completed, take 1000IU daily OTC 8 capsule 0  . Vitamin D, Ergocalciferol, (DRISDOL) 50000 units CAPS capsule Take by mouth.    . vitamin k 100 MCG tablet Take 100 mcg by mouth 2 (two) times a week.     No current facility-administered medications for this visit.    Facility-Administered Medications Ordered in Other Visits  Medication Dose Route Frequency Provider Last Rate Last Dose  . sodium chloride flush (NS) 0.9 % injection 10 mL  10 mL Intravenous PRN Earlie Server, MD   10 mL at 10/01/17 0803    Review of Systems Review of Systems  Constitutional: Negative.   Respiratory: Negative.   Cardiovascular: Negative.     Blood pressure 122/74, pulse 68, resp. rate 12, height 5' (1.524 m), weight 206 lb (93.4 kg), SpO2 97 %.  Physical Exam Physical Exam  Constitutional: She is oriented to person, place, and time. She appears well-developed and well-nourished.  HENT:  Mouth/Throat: Oropharynx is clear and moist.  Eyes: Conjunctivae are normal. No scleral icterus.  Neck: Neck supple.  Cardiovascular: Normal rate, regular rhythm and normal heart sounds.  Pulmonary/Chest: Effort normal and breath sounds normal. Right breast exhibits no inverted nipple, no mass, no nipple discharge, no skin change and no tenderness. Left breast exhibits no inverted nipple, no mass, no nipple discharge, no skin change and no tenderness.  Left lumpectomy site well healed.     Lymphadenopathy:    She has no cervical adenopathy.    She has no axillary adenopathy.  Neurological: She is alert and oriented to person, place, and time.   Skin: Skin is warm and dry.  Psychiatric: Her behavior is normal.    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  These are no longer being administered.  If she confirms with the treating ophthalmologist at [facility] that further infusions will not  be necessary, we will proceed with port removal.  Bilateral mammograms dated May 04, 2018 were reviewed.  Postsurgical changes.  BI-RADS-2.  Assessment    No evidence of recurrent breast cancer.  Status post partial corneal transplant.    Plan    Remove port when acceptable to the rheumatology and medical oncology department.  The patient has been asked to return to the office in one year with a bilateral diagnostic mammogram. The patient is aware to call back for any questions or new concerns.      HPI, Physical Exam, Assessment and Plan have been scribed under the direction and in the presence of Robert Bellow, MD. Karie Fetch, RN  I have completed the exam and reviewed the above documentation for accuracy and completeness.  I agree with the above.  Haematologist has been used and any errors in dictation or transcription are unintentional.  Hervey Ard, M.D., F.A.C.S.  Forest Gleason Byrnett 05/20/2018, 8:21 PM

## 2018-06-03 DIAGNOSIS — Z947 Corneal transplant status: Secondary | ICD-10-CM | POA: Diagnosis not present

## 2018-06-03 DIAGNOSIS — H11821 Conjunctivochalasis, right eye: Secondary | ICD-10-CM | POA: Diagnosis not present

## 2018-06-03 DIAGNOSIS — H16002 Unspecified corneal ulcer, left eye: Secondary | ICD-10-CM | POA: Diagnosis not present

## 2018-06-09 ENCOUNTER — Inpatient Hospital Stay: Payer: Medicaid Other | Attending: Oncology

## 2018-06-09 DIAGNOSIS — Z452 Encounter for adjustment and management of vascular access device: Secondary | ICD-10-CM | POA: Insufficient documentation

## 2018-06-09 DIAGNOSIS — Z170 Estrogen receptor positive status [ER+]: Secondary | ICD-10-CM | POA: Insufficient documentation

## 2018-06-09 DIAGNOSIS — C50212 Malignant neoplasm of upper-inner quadrant of left female breast: Secondary | ICD-10-CM | POA: Insufficient documentation

## 2018-06-09 DIAGNOSIS — Z95828 Presence of other vascular implants and grafts: Secondary | ICD-10-CM

## 2018-06-09 MED ORDER — SODIUM CHLORIDE 0.9% FLUSH
10.0000 mL | Freq: Once | INTRAVENOUS | Status: AC
  Administered 2018-06-09: 10 mL via INTRAVENOUS
  Filled 2018-06-09: qty 10

## 2018-06-09 MED ORDER — HEPARIN SOD (PORK) LOCK FLUSH 100 UNIT/ML IV SOLN
500.0000 [IU] | Freq: Once | INTRAVENOUS | Status: AC
  Administered 2018-06-09: 500 [IU] via INTRAVENOUS
  Filled 2018-06-09: qty 5

## 2018-06-24 DIAGNOSIS — Z947 Corneal transplant status: Secondary | ICD-10-CM | POA: Diagnosis not present

## 2018-06-24 DIAGNOSIS — H11821 Conjunctivochalasis, right eye: Secondary | ICD-10-CM | POA: Diagnosis not present

## 2018-06-29 ENCOUNTER — Ambulatory Visit: Payer: Medicaid Other | Admitting: Family Medicine

## 2018-06-29 ENCOUNTER — Encounter: Payer: Self-pay | Admitting: Family Medicine

## 2018-06-29 VITALS — BP 124/68 | HR 100 | Temp 98.2°F | Resp 16 | Ht 60.0 in | Wt 212.2 lb

## 2018-06-29 DIAGNOSIS — Z13820 Encounter for screening for osteoporosis: Secondary | ICD-10-CM | POA: Diagnosis not present

## 2018-06-29 DIAGNOSIS — E0520 Thyrotoxicosis with toxic multinodular goiter without thyrotoxic crisis or storm: Secondary | ICD-10-CM

## 2018-06-29 DIAGNOSIS — Z1159 Encounter for screening for other viral diseases: Secondary | ICD-10-CM

## 2018-06-29 DIAGNOSIS — R519 Headache, unspecified: Secondary | ICD-10-CM

## 2018-06-29 DIAGNOSIS — H9313 Tinnitus, bilateral: Secondary | ICD-10-CM | POA: Diagnosis not present

## 2018-06-29 DIAGNOSIS — E559 Vitamin D deficiency, unspecified: Secondary | ICD-10-CM | POA: Diagnosis not present

## 2018-06-29 DIAGNOSIS — Z114 Encounter for screening for human immunodeficiency virus [HIV]: Secondary | ICD-10-CM

## 2018-06-29 DIAGNOSIS — E785 Hyperlipidemia, unspecified: Secondary | ICD-10-CM | POA: Diagnosis not present

## 2018-06-29 DIAGNOSIS — R51 Headache: Secondary | ICD-10-CM | POA: Diagnosis not present

## 2018-06-29 NOTE — Progress Notes (Signed)
Name: Sheena Martinez   MRN: [id number]    DOB: 1960-09-26   Date:06/29/2018       Progress Note  Subjective  Chief Complaint  Chief Complaint  Patient presents with  . Tinnitus  . Cyst    sores in head  . Follow-up    recheck labs for Vitamin D  . Knee Pain    right knee for 3 weeks    HPI [name], presents with the following concerns:  Tinnitus: Bilateral tinnitus started 3 weeks ago.  She is wondering if the steroid may have caused this because it occurred after she finished the prednisone.  The ringing is constant throughout the day, occasional headache.  No family history of meniere's disease.  Has not had vertigo, no unsteady gait, recent falls, or feeling off balance, no decrease/changes in her hearing  Scalp tenderness:  She was rubbing her head 1 week ago because it was sore, and felt something move - has not had this sensation since that episode. She notes concern that she could have "bed sores".  She sits in her recliner and sleeps there too.  She states areas of soreness are where her recliner touch her head.  Discussed changing positions, sleeping in her bed, using a donut shaped pillow to help relieve some of the pressure.   Vitamin D: Taking 1000IU daily. Taking vitamin K twice weekly.  Discussed increased risk of osteoporosis based on cancer treatment and low vitamin D history - we will order DEXA today and vitamin D recheck.  Knee Pain: She noticed RIGHT knee pain flared back up after she stopped her prednisone. 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  She has had joint injection in the past and would like to pursue this at this time.  States she has seen [facility] in the past and did well with them - will go to [facility] clinic.   Patient Active Problem List   Diagnosis Date Noted  . Irregular menstrual cycle 03/02/2018  . Screening for cervical  cancer 03/02/2018  . Long-term current use of tamoxifen 02/17/2018  . 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 Anxiety 10/03/2017  . Bilateral primary osteoarthritis of knee 10/03/2017  . 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 Malignant neoplasm of upper-inner quadrant of left breast in female, estrogen receptor positive ([place]) 05/22/2017  . Abnormal finding on thyroid function test 11/24/2015  . Hirsutism 11/24/2015  . Hyperlipidemia 02/01/2014  . Impaired glucose tolerance 02/01/2014  . Vitamin D deficiency 02/01/2014  . Family history of diabetes mellitus 01/31/2014  . Morbid obesity with BMI of 40.0-44.9, adult ([place]) 01/31/2014  . Sjogren's syndrome ([place]) 01/17/2014    Past Surgical History:  Procedure Laterality Date  . BREAST BIOPSY Left 05/12/2017   [place] bx/ INVASIVE MAMMARY CARCINOMA  . BREAST LUMPECTOMY Left 05/2017   invasive mammary carcinoma, neg margins  . BREAST LUMPECTOMY WITH SENTINEL LYMPH NODE BIOPSY Left 06/10/2017   Procedure: BREAST LUMPECTOMY WITH SENTINEL LYMPH NODE BX AND NEEDLE LOCALIZATION;  Surgeon: Robert Bellow, MD;  Location: [facility] ORS;  Service: General;  Laterality: Left;  . BREAST MAMMOSITE Left 06/24/2017   Procedure: MAMMOSITE BREAST;  Surgeon: Robert Bellow, MD;  Location: [facility] ORS;  Service: General;  Laterality: Left;  . CESAREAN SECTION  1995  . 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 COSMETIC SURGERY    .  CYST EXCISION  2018   pilar cyst/ Dr Will Bonnet  . EYE SURGERY    . PORTACATH PLACEMENT Right 07/08/2017   Procedure: INSERTION PORT-A-CATH;  Surgeon: Robert Bellow, MD;  Location: [facility] ORS;  Service: General;  Laterality: Right;  . TONSILLECTOMY      Family History  Problem Relation Age of Onset  . Breast cancer Maternal Aunt 6       currently ~65  . Diabetes Mother   . Skin cancer Mother        currently 63; TAH/BSO (age?)  . Anemia Mother   . Liver disease Father        deceased / not much info about him /  alcoholic  . Lung cancer Paternal Aunt        smoker / deceased 82s  . Stomach cancer Paternal Uncle 52       deceased / deceased 3s  . Ovarian cancer Maternal Grandmother 11       deceased 25s  . Stomach cancer Paternal Grandfather 86       deceased 73s  . Cancer Maternal Uncle        unk. type; deceased 59s  . Leukemia Cousin 60       deceased 11    Social History   Socioeconomic History  . Marital status: Married    Spouse name: Not on file  . Number of children: Not on file  . Years of education: Not on file  . Highest education level: Not on file  Occupational History  . Not on file  Social Needs  . Financial resource strain: Not on file  . Food insecurity:    Worry: Never true    Inability: Never true  . Transportation needs:    Medical: No    Non-medical: No  Tobacco Use  . Smoking status: Former Smoker    Years: 7.00    Types: Cigarettes    Last attempt to quit: 08/13/1979    Years since quitting: 38.9  . Smokeless tobacco: Never Used  . Tobacco comment: AGE [age]  Substance and Sexual Activity  . Alcohol use: No  . Drug use: No  . Sexual activity: Yes  Lifestyle  . Physical activity:    Days per week: Not on file    Minutes per session: Not on file  . Stress: Not on file  Relationships  . Social connections:    Talks on phone: Not on file    Gets together: Not on file    Attends religious service: Not on file    Active member of club or organization: Not on file    Attends meetings of clubs or organizations: Not on file    Relationship status: Not on file  . Intimate partner violence:    Fear of current or ex partner: Not on file    Emotionally abused: Not on file    Physically abused: Not on file    Forced sexual activity: Not on file  Other Topics Concern  . Not on file  Social History Narrative  . Not on file     Current Outpatient Medications:  .  acetaminophen (TYLENOL) 500 MG tablet, Take 1,000 mg by mouth every 8 (eight) hours as  needed for mild pain or headache., Disp: , Rfl:  .  busPIRone (BUSPAR) 7.5 MG tablet, Take by mouth., Disp: , Rfl:  .  Ca Carbonate-Mag Hydroxide (ROLAIDS PO), Take 1 tablet as needed by mouth (indigestion). , Disp: , Rfl:  .  carboxymethylcellulose 1 % ophthalmic solution, Place 1 drop into the left eye 4 (four) times daily., Disp: , Rfl:  .  cholecalciferol (VITAMIN D) 1000 units tablet, Take 2,000 Units by mouth daily., Disp: , Rfl:  .  ketoconazole (NIZORAL) 2 % shampoo, Apply 1 application topically 2 (two) times a week., Disp: , Rfl:  .  LORazepam (ATIVAN) 0.5 MG tablet, , Disp: , Rfl: 0 .  meloxicam (MOBIC) 7.5 MG tablet, TAKE 1 TABLET BY MOUTH AT BEDTIME, Disp: 90 tablet, Rfl: 0 .  methimazole (TAPAZOLE) 10 MG tablet, TAKE 1 TABLET BY MOUTH ONCE DAILY AT 6AM, Disp: , Rfl: 2 .  prednisoLONE acetate (PRED FORTE) 1 % ophthalmic suspension, , Disp: , Rfl: 1 .  sertraline (ZOLOFT) 100 MG tablet, Take 100 mg by mouth daily., Disp: , Rfl: 1 .  tamoxifen (NOLVADEX) 20 MG tablet, Take 1 tablet (20 mg total) by mouth daily., Disp: 30 tablet, Rfl: 6 .  vitamin C (ASCORBIC ACID) 500 MG tablet, Take 500 mg by mouth daily., Disp: , Rfl:  .  vitamin k 100 MCG tablet, Take 100 mcg by mouth 2 (two) times a week., Disp: , Rfl:  .  brimonidine (ALPHAGAN) 0.2 % ophthalmic solution, INSTILL 1 DROP INTO LEFT EYE THREE TIMES DAILY, Disp: , Rfl: 12 .  predniSONE (DELTASONE) 10 MG tablet, Take 10 mg by mouth daily. , Disp: , Rfl: 0 .  Vitamin D, Ergocalciferol, (DRISDOL) 50000 units CAPS capsule, Take 1 capsule (50,000 Units total) by mouth every 7 (seven) days. Once completed, take 1000IU daily OTC (Patient not taking: Reported on 06/29/2018), Disp: 8 capsule, Rfl: 0 .  Vitamin D, Ergocalciferol, (DRISDOL) 50000 units CAPS capsule, Take by mouth., Disp: , Rfl:  No current facility-administered medications for this visit.   Facility-Administered Medications Ordered in Other Visits:  .  sodium chloride flush (NS)  0.9 % injection 10 mL, 10 mL, Intravenous, PRN, Earlie Server, MD, 10 mL at 10/01/17 0803  Allergies  Allergen Reactions  . Klonopin [Clonazepam]   . Sulfamethoxazole-Trimethoprim Rash    Chest tightness. Bactrim    I personally reviewed active problem list, medication list, allergies, health maintenance, lab results with the patient/caregiver today.   ROS Constitutional: Negative for fever or weight change.  Respiratory: Negative for cough and shortness of breath.   Cardiovascular: Negative for chest pain or palpitations.  Gastrointestinal: Negative for abdominal pain, no bowel changes.  Musculoskeletal: Negative for gait problem or joint swelling. See HPI Skin: Negative for rash.  Neurological: Negative for dizziness or headache.  No other specific complaints in a complete review of systems (except as listed in HPI above).  Objective  Vitals:   06/29/18 1121  BP: 124/68  Pulse: 100  Resp: 16  Temp: 98.2 F (36.8 C)  TempSrc: Oral  SpO2: 98%  Weight: 212 lb 3.2 oz (96.3 kg)  Height: 5' (1.524 m)   Body mass index is 41.44 kg/m.  Physical Exam  Constitutional: Patient appears well-developed and well-nourished. No distress.  HENT: Head: Normocephalic and atraumatic. Ears: bilateral TMs with no erythema or effusion, no cerumen impaction; Nose: Nose normal. Mouth/Throat: Oropharynx is clear and moist. No oropharyngeal exudate or tonsillar swelling.  Eyes: Conjunctivae and EOM are normal. No scleral icterus.  Neck: Normal range of motion. Neck supple. No JVD present. Goiter present (seeing endocrinology).  Cardiovascular: Normal rate, regular rhythm and normal heart sounds.  No murmur heard. No BLE edema. Pulmonary/Chest: Effort normal and breath sounds normal. No respiratory distress. Abdominal:  Soft. Bowel sounds are normal, no distension. There is no tenderness. No masses. Musculoskeletal: Normal range of motion, no joint effusions. No gross deformities. Bilateral knees  exhibit crepitus without laxity or tenderness. Normal popliteal fossa. Neurological: Pt is alert and oriented to person, place, and time. No cranial nerve deficit. Coordination, balance, strength, speech and gait are normal.  Skin: Skin is warm and dry. No rash noted. No erythema. Scalp has no erythema, no lesions, no louse/eggs visualized. Psychiatric: Patient has a normal mood and affect. behavior is normal. Judgment and thought content normal.  No results found for this or any previous visit (from the past 72 hour(s)).  PHQ2/9: Depression screen [facility] 2/9 06/29/2018 01/22/2018 01/14/2018 08/13/2017  Decreased Interest 0 0 0 0  Down, Depressed, Hopeless 0 0 0 0  PHQ - 2 Score 0 0 0 0  Altered sleeping 0 0 - -  Tired, decreased energy 0 1 - -  Change in appetite 0 0 - -  Feeling bad or failure about yourself  0 0 - -  Trouble concentrating 0 0 - -  Moving slowly or fidgety/restless 0 0 - -  Suicidal thoughts 0 0 - -  PHQ-9 Score 0 1 - -  Difficult doing work/chores Not difficult at all Very difficult - -   Fall Risk: Fall Risk  06/29/2018 01/14/2018 08/13/2017 07/30/2017  Falls in the past year? 0 No No No   Assessment & Plan  1. Scalp tenderness Advised possible SE of her cancer treatment, discussed removing pressure from scalp when in her recliner, monitoring for sores, abscess, and checking for bed bugs.  2. Osteoporosis screening - [facility] DEXA SCAN  3. Vitamin D deficiency - Vitamin D (25 hydroxy) - Continue OTC Vitamin D supplementation  4. Tinnitus of both ears - Ambulatory referral to ENT  5. Hyperlipidemia, unspecified hyperlipidemia type - Lipid panel  6. Need for hepatitis C screening test - Hepatitis C antibody  7. Encounter for screening for HIV - HIV Antibody (routine testing w rflx)

## 2018-06-29 NOTE — Patient Instructions (Signed)
[facility] - Mon-Fri 1pm-7pm for your knee.

## 2018-06-30 LAB — HIV ANTIBODY (ROUTINE TESTING W REFLEX): HIV 1&2 Ab, 4th Generation: NONREACTIVE

## 2018-06-30 LAB — LIPID PANEL
Cholesterol: 213 mg/dL — ABNORMAL HIGH (ref <200)
HDL: 55 mg/dL (ref >50)
LDL Cholesterol (Calc): 129 mg/dL (calc) — ABNORMAL HIGH
Non-HDL Cholesterol (Calc): 158 mg/dL (calc) — ABNORMAL HIGH (ref <130)
Total CHOL/HDL Ratio: 3.9 (calc) (ref <5.0)
Triglycerides: 171 mg/dL — ABNORMAL HIGH (ref <150)

## 2018-06-30 LAB — HEPATITIS C ANTIBODY
Hepatitis C Ab: NONREACTIVE
SIGNAL TO CUT-OFF: 0.03 (ref <1.00)

## 2018-06-30 LAB — VITAMIN D 25 HYDROXY (VIT D DEFICIENCY, FRACTURES): Vit D, 25-Hydroxy: 24 ng/mL — ABNORMAL LOW (ref 30–100)

## 2018-07-02 ENCOUNTER — Encounter: Payer: Self-pay | Admitting: Family Medicine

## 2018-07-13 ENCOUNTER — Other Ambulatory Visit: Payer: Self-pay

## 2018-07-13 ENCOUNTER — Ambulatory Visit
Admission: RE | Admit: 2018-07-13 | Discharge: 2018-07-13 | Disposition: A | Discharge status: Home / Self Care | Payer: Medicaid Other | Source: Ambulatory Visit | Attending: Radiation Oncology | Admitting: Radiation Oncology

## 2018-07-13 ENCOUNTER — Encounter: Payer: Self-pay | Admitting: Radiation Oncology

## 2018-07-13 VITALS — BP 123/81 | HR 82 | Temp 97.8°F | Resp 16 | Wt 215.6 lb

## 2018-07-13 DIAGNOSIS — R200 Anesthesia of skin: Secondary | ICD-10-CM | POA: Diagnosis not present

## 2018-07-13 DIAGNOSIS — C50212 Malignant neoplasm of upper-inner quadrant of left female breast: Secondary | ICD-10-CM | POA: Insufficient documentation

## 2018-07-13 DIAGNOSIS — Z923 Personal history of irradiation: Secondary | ICD-10-CM | POA: Insufficient documentation

## 2018-07-13 DIAGNOSIS — Z170 Estrogen receptor positive status [ER+]: Secondary | ICD-10-CM | POA: Insufficient documentation

## 2018-07-13 DIAGNOSIS — Z79810 Long term (current) use of selective estrogen receptor modulators (SERMs): Secondary | ICD-10-CM | POA: Diagnosis not present

## 2018-07-13 DIAGNOSIS — H9319 Tinnitus, unspecified ear: Secondary | ICD-10-CM | POA: Insufficient documentation

## 2018-07-13 NOTE — Progress Notes (Signed)
Radiation Oncology Follow up Note  Name: Sheena Martinez   Date:   07/13/2018 MRN:  [id number] DOB: 1960-09-06    This 57 y.o. female presents to the clinic today for 1 year follow-up status post accelerated partial breast irradiation to her left breast for stage I ER/PR positive invasive mammary carcinoma.  REFERRING PROVIDER: Hubbard Hartshorn, FNP  HPI: patient is a [age]-year-old female now seen out 1 year having completed accelerated partial breast radiation to her left breast for stage I ER/PR positive invasive mammary carcinoma. From a breast standpoint she is doing well. She specifically denies breast tenderness cough or bone pain. She has been having some unusual symptoms including numbness around her mouth tinnitus. She's currently on tamoxifen..last mammogram was back in September which I have reviewed was BI-RADS 2 benign.  COMPLICATIONS OF TREATMENT: none  FOLLOW UP COMPLIANCE: keeps appointments   PHYSICAL EXAM:  BP 123/81 (BP Location: Right Arm, Patient Position: Sitting)   Pulse 82   Temp 97.8 F (36.6 C) (Tympanic)   Resp 16   Wt 215 lb 9.8 oz (97.8 kg)   BMI 42.11 kg/m  Lungs are clear to A&P cardiac examination essentially unremarkable with regular rate and rhythm. No dominant mass or nodularity is noted in either breast in 2 positions examined. Incision is well-healed. No axillary or supraclavicular adenopathy is appreciated. Cosmetic result is excellent.Well-developed well-nourished patient in NAD. HEENT reveals PERLA, EOMI, discs not visualized.  Oral cavity is clear. No oral mucosal lesions are identified. Neck is clear without evidence of cervical or supraclavicular adenopathy. Lungs are clear to A&P. Cardiac examination is essentially unremarkable with regular rate and rhythm without murmur rub or thrill. Abdomen is benign with no organomegaly or masses noted. Motor sensory and DTR levels are equal and symmetric in the upper and lower extremities. Cranial nerves II  through XII are grossly intact. Proprioception is intact. No peripheral adenopathy or edema is identified. No motor or sensory levels are noted. Crude visual fields are within normal range.  RADIOLOGY RESULTS: mammograms reviewed and compatible with the above-stated findings  PLAN: at this time patient is doing well I've asked to see her back in 1 year for follow-up. I have discussed possibly stopping her tamoxifen for couple weeks to see denies any effect on some of these unusual symptoms she's having. She would like to get that cleared by Dr. Tasia Catchings. Patient knows to call with any concerns at any time.  I would like to take this opportunity to thank you for allowing me to participate in the care of your patient.Noreene Filbert, MD

## 2018-07-17 DIAGNOSIS — H16002 Unspecified corneal ulcer, left eye: Secondary | ICD-10-CM | POA: Diagnosis not present

## 2018-07-17 DIAGNOSIS — H11821 Conjunctivochalasis, right eye: Secondary | ICD-10-CM | POA: Diagnosis not present

## 2018-07-21 ENCOUNTER — Encounter: Payer: Self-pay | Admitting: Family Medicine

## 2018-07-21 ENCOUNTER — Other Ambulatory Visit: Payer: Self-pay | Admitting: Family Medicine

## 2018-07-21 ENCOUNTER — Ambulatory Visit: Payer: Medicaid Other | Admitting: Family Medicine

## 2018-07-21 VITALS — BP 124/70 | HR 85 | Temp 98.5°F | Resp 16 | Ht 60.0 in | Wt 213.0 lb

## 2018-07-21 DIAGNOSIS — L723 Sebaceous cyst: Secondary | ICD-10-CM | POA: Diagnosis not present

## 2018-07-21 DIAGNOSIS — F419 Anxiety disorder, unspecified: Secondary | ICD-10-CM | POA: Diagnosis not present

## 2018-07-21 DIAGNOSIS — Z13820 Encounter for screening for osteoporosis: Secondary | ICD-10-CM

## 2018-07-21 DIAGNOSIS — C50212 Malignant neoplasm of upper-inner quadrant of left female breast: Secondary | ICD-10-CM

## 2018-07-21 DIAGNOSIS — L089 Local infection of the skin and subcutaneous tissue, unspecified: Secondary | ICD-10-CM

## 2018-07-21 DIAGNOSIS — L680 Hirsutism: Secondary | ICD-10-CM | POA: Diagnosis not present

## 2018-07-21 DIAGNOSIS — E0520 Thyrotoxicosis with toxic multinodular goiter without thyrotoxic crisis or storm: Secondary | ICD-10-CM

## 2018-07-21 DIAGNOSIS — R519 Headache, unspecified: Secondary | ICD-10-CM

## 2018-07-21 DIAGNOSIS — M170 Bilateral primary osteoarthritis of knee: Secondary | ICD-10-CM | POA: Diagnosis not present

## 2018-07-21 DIAGNOSIS — H9313 Tinnitus, bilateral: Secondary | ICD-10-CM

## 2018-07-21 DIAGNOSIS — E559 Vitamin D deficiency, unspecified: Secondary | ICD-10-CM | POA: Diagnosis not present

## 2018-07-21 DIAGNOSIS — Z79810 Long term (current) use of selective estrogen receptor modulators (SERMs): Secondary | ICD-10-CM

## 2018-07-21 DIAGNOSIS — H16072 Perforated corneal ulcer, left eye: Secondary | ICD-10-CM | POA: Diagnosis not present

## 2018-07-21 DIAGNOSIS — E785 Hyperlipidemia, unspecified: Secondary | ICD-10-CM

## 2018-07-21 DIAGNOSIS — Z23 Encounter for immunization: Secondary | ICD-10-CM

## 2018-07-21 DIAGNOSIS — R51 Headache: Secondary | ICD-10-CM

## 2018-07-21 DIAGNOSIS — Z791 Long term (current) use of non-steroidal anti-inflammatories (NSAID): Secondary | ICD-10-CM

## 2018-07-21 DIAGNOSIS — Z170 Estrogen receptor positive status [ER+]: Secondary | ICD-10-CM

## 2018-07-21 DIAGNOSIS — Z6841 Body Mass Index (BMI) 40.0 and over, adult: Secondary | ICD-10-CM

## 2018-07-21 MED ORDER — DOXYCYCLINE HYCLATE 100 MG PO TABS: 100.0000 mg | ORAL_TABLET | Freq: Two times a day (BID) | ORAL | 0 refills | Status: DC

## 2018-07-21 MED ORDER — MELOXICAM 7.5 MG PO TABS: 7.5000 mg | ORAL_TABLET | Freq: Every day | ORAL | 1 refills | Status: DC

## 2018-07-21 NOTE — Progress Notes (Signed)
Name: Sheena Martinez   MRN: [id number]    DOB: 1961/06/28   Date:07/21/2018       Progress Note  Subjective  Chief Complaint  Chief Complaint  Patient presents with  . Follow-up    HPI  Sjogren's: Seeing rheumatology on routine basis; likely the cause of her corneal perforation.  She is not on DMARD at this time, though it has been discussed with rheumatology.  Tinnitus: Saw radiation oncologist and told him and they told her to stop tomoxifen x2 weeks to see if the ringing stops.  If this doesn't work, she will wean off of buspar.  Has ENT appointment tomorrow.  She has been off tomoxifen x7 days and still has ringing.   Scalp tenderness - Improving  - using a neck pillow to take the pressure off of her scalp. No sores to her scalp.   Vitamin D Deficiency: Is due for DEXA scan - will call soon to schedule. Vitamin D was low at 24 - taking 2000IU x90 days, then will go back down to 1000IU daily.  She is also taking vitamin K twice weekly.   Right Knee pain and Arthralgias: She noticed RIGHT knee pain flared back up after she stopped her prednisone. 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  She has had joint injection in the past and would like to pursue this at this time.  States she has [facility] in [facility] past and did well with them - will go [facility] when she is ready to do so.  RLE swelling: She notes has had RLE edema for many years, even before cancer treatment.  She states the right ankle aches quite a bit.  She will elevate at night and apply icy hot and this helps temporarily, swelling goes down, but the pain and swelling return the next day.   Anxiety: She is seeing psychiatry with [facility] - Dr. Register and is doing well on current medication regimen.  She was referred for counseling but does not have the time right now.   Hot flashes: Have been going on  for many months, she attributed them to the tamoxifen. Her last period was before starting Tapazole in May 2019.  No drenching sweats.  Discussed possibly menopausal symptoms vs medication SE.  She states she is able to tolerate at this time. She has talked to GYN about this in the past, but they were mild at that time.   Breast Cancer - LEFT BREAST: Following up with Dr. Tasia Catchings and Dr. Donella Stade, stopped  - has been of Tamoxifen as above. Recent mammogram in September 2019 with Dr. Bary Castilla was normal  She had LEFT lumpectomy in October 2018, had radiation and chemo therapy.  RIGHT chest cyst: Has had cyst just medial to the RIGHT breast for many years, it is a little inflamed today and tender. We will rx Doxy today and she will call her dermatologist for possible removal. No fevers, no red streaking, no exudate.  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 She is using prednisone drops and brimonidine drops, and has had sutures removed/have come out on their own.   Abnormal Thyroid Test: She reports abnormal thyroid testing in the past ; TSH on 10/15/17 was 0.01 - she is seeing endocrinology now for  management and taking Tapazole - they discussed radioactive medication for ablation, but they chose to wait until her cornea and ulcerative keratitis has healed.  She denies abnormal fatigue; she does have hairloss (is s/p chemo), denies palpitations, chest pain, shortness of breath; endorses ongoing anxiety as above. Hot flashes as above/  Hirsutism: Has history elevated testosterone and PCOS in the past, used to receive care from endocrinology for this, however she lost follow up when she lost insurance several years ago.  She was referred to endocrinology for this and is seeing them.  Elevated LDL: ASCVD 2.4% risk; no need for statin at this time, lifestyle modifications discussed in detail.  Will  continue to monitor periodically. Stable today. The 10-year ASCVD risk score Mikey Bussing DC Brooke Bonito., et al., 2013) is: 2.4%   Values used to calculate the score:     Age: 57 years     Sex: Female     Is Non-Hispanic African American: No     Diabetic: No     Tobacco smoker: No     Systolic Blood Pressure: 025 mmHg     Is BP treated: No     HDL Cholesterol: 55 mg/dL     Total Cholesterol: 213 mg/dL  Obesity: Her weight is stable., She is joining a women's only gym for seniors with her friend, Gerald Stabs.  This gym also offers nutrition services which she will be using.   We discussed lifestyle modifications in detail.   Patient Active Problem List   Diagnosis Date Noted  . 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 Irregular menstrual cycle 03/02/2018  . Screening for cervical cancer 03/02/2018  . Long-term current use of tamoxifen 02/17/2018  . 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 Anxiety 10/03/2017  . Bilateral primary osteoarthritis of knee 10/03/2017  . 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 Malignant neoplasm of upper-inner quadrant of left breast in female, estrogen receptor positive ([place]) 05/22/2017  . Abnormal finding on thyroid function test 11/24/2015  . Hirsutism 11/24/2015  . Hyperlipidemia 02/01/2014  . Impaired glucose tolerance 02/01/2014  . Vitamin D deficiency 02/01/2014  . Family history of diabetes mellitus 01/31/2014  . Morbid obesity with BMI of 40.0-44.9, adult ([place]) 01/31/2014  . Sjogren's syndrome ([place]) 01/17/2014    Past Surgical History:  Procedure Laterality Date  . BREAST BIOPSY Left 05/12/2017   [place] bx/ INVASIVE MAMMARY CARCINOMA  . BREAST LUMPECTOMY Left 05/2017   invasive mammary carcinoma, neg margins  . BREAST LUMPECTOMY WITH SENTINEL LYMPH NODE BIOPSY Left 06/10/2017   Procedure: BREAST LUMPECTOMY WITH SENTINEL LYMPH NODE BX AND NEEDLE LOCALIZATION;  Surgeon: Robert Bellow, MD;  Location: [facility]  ORS;  Service: General;  Laterality: Left;  . BREAST MAMMOSITE Left 06/24/2017   Procedure: MAMMOSITE BREAST;  Surgeon: Robert Bellow, MD;  Location: [facility] ORS;  Service: General;  Laterality: Left;  . CESAREAN SECTION  1995  . 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 COSMETIC SURGERY    . CYST EXCISION  2018   pilar cyst/ Dr Will Bonnet  . EYE SURGERY    . PORTACATH PLACEMENT Right 07/08/2017   Procedure: INSERTION PORT-A-CATH;  Surgeon: Robert Bellow, MD;  Location: [facility] ORS;  Service: General;  Laterality: Right;  . TONSILLECTOMY      Family History  Problem Relation Age of Onset  . Breast cancer Maternal Aunt 54       currently ~65  . Diabetes Mother   .  Skin cancer Mother        currently 33; TAH/BSO (age?)  . Anemia Mother   . Liver disease Father        deceased / not much info about him / alcoholic  . Lung cancer Paternal Aunt        smoker / deceased 69s  . Stomach cancer Paternal Uncle 43       deceased / deceased 31s  . Ovarian cancer Maternal Grandmother 45       deceased 33s  . Stomach cancer Paternal Grandfather 71       deceased 68s  . Cancer Maternal Uncle        unk. type; deceased 36s  . Leukemia Cousin 22       deceased 18    Social History   Socioeconomic History  . Marital status: Married    Spouse name: Not on file  . Number of children: Not on file  . Years of education: Not on file  . Highest education level: Not on file  Occupational History  . Not on file  Social Needs  . Financial resource strain: Not on file  . Food insecurity:    Worry: Never true    Inability: Never true  . Transportation needs:    Medical: No    Non-medical: No  Tobacco Use  . Smoking status: Former Smoker    Years: 7.00    Types: Cigarettes    Last attempt to quit: 08/13/1979    Years since quitting: 38.9  . Smokeless tobacco: Never Used  . Tobacco comment: AGE [age]  Substance and Sexual Activity  . Alcohol use: No  . Drug use: No  . Sexual activity:  Yes  Lifestyle  . Physical activity:    Days per week: Not on file    Minutes per session: Not on file  . Stress: Not on file  Relationships  . Social connections:    Talks on phone: Not on file    Gets together: Not on file    Attends religious service: Not on file    Active member of club or organization: Not on file    Attends meetings of clubs or organizations: Not on file    Relationship status: Not on file  . Intimate partner violence:    Fear of current or ex partner: Not on file    Emotionally abused: Not on file    Physically abused: Not on file    Forced sexual activity: Not on file  Other Topics Concern  . Not on file  Social History Narrative  . Not on file     Current Outpatient Medications:  .  acetaminophen (TYLENOL) 500 MG tablet, Take 1,000 mg by mouth every 8 (eight) hours as needed for mild pain or headache., Disp: , Rfl:  .  brimonidine (ALPHAGAN) 0.2 % ophthalmic solution, INSTILL 1 DROP INTO LEFT EYE THREE TIMES DAILY, Disp: , Rfl: 12 .  busPIRone (BUSPAR) 7.5 MG tablet, Take by mouth., Disp: , Rfl:  .  Ca Carbonate-Mag Hydroxide (ROLAIDS PO), Take 1 tablet as needed by mouth (indigestion). , Disp: , Rfl:  .  carboxymethylcellulose 1 % ophthalmic solution, Place 1 drop into the left eye 4 (four) times daily., Disp: , Rfl:  .  cholecalciferol (VITAMIN D) 1000 units tablet, Take 2,000 Units by mouth daily., Disp: , Rfl:  .  ketoconazole (NIZORAL) 2 % shampoo, Apply 1 application topically 2 (two) times a week., Disp: , Rfl:  .  LORazepam (ATIVAN) 0.5 MG tablet, , Disp: , Rfl: 0 .  meloxicam (MOBIC) 7.5 MG tablet, TAKE 1 TABLET BY MOUTH AT BEDTIME, Disp: 90 tablet, Rfl: 0 .  methimazole (TAPAZOLE) 10 MG tablet, TAKE 1 TABLET BY MOUTH ONCE DAILY AT 6AM, Disp: , Rfl: 2 .  prednisoLONE acetate (PRED FORTE) 1 % ophthalmic suspension, , Disp: , Rfl: 1 .  sertraline (ZOLOFT) 100 MG tablet, Take 100 mg by mouth daily., Disp: , Rfl: 1 .  tamoxifen (NOLVADEX) 20 MG  tablet, Take 1 tablet (20 mg total) by mouth daily., Disp: 30 tablet, Rfl: 6 .  vitamin C (ASCORBIC ACID) 500 MG tablet, Take 500 mg by mouth daily., Disp: , Rfl:  .  Vitamin D, Ergocalciferol, (DRISDOL) 50000 units CAPS capsule, Take by mouth., Disp: , Rfl:  .  vitamin k 100 MCG tablet, Take 100 mcg by mouth 2 (two) times a week., Disp: , Rfl:   Allergies  Allergen Reactions  . Klonopin [Clonazepam]   . Sulfamethoxazole-Trimethoprim Rash    Chest tightness. Bactrim    I personally reviewed active problem list, medication list, allergies, health maintenance, notes from last encounter, lab results with the patient/caregiver today.   ROS Ten systems reviewed and is negative except as mentioned in HPI.  Objective  Vitals:   07/21/18 1314  BP: 124/70  Pulse: 85  Resp: 16  Temp: 98.5 F (36.9 C)  TempSrc: Oral  SpO2: 98%  Weight: 213 lb (96.6 kg)  Height: 5' (1.524 m)   Body mass index is 41.6 kg/m.  Physical Exam Constitutional: Patient appears well-developed and well-nourished. No distress.  HENT: Head: Normocephalic and atraumatic. Ears: bilateral TMs with no erythema or effusion; Nose: Nose normal. Mouth/Throat: Oropharynx is clear and moist. No oropharyngeal exudate or tonsillar swelling.  Eyes: Conjunctivae and EOM are normal. No scleral icterus.  Pupils are equal, round, and reactive to light.  Neck: Normal range of motion. Neck supple. No JVD present. No thyromegaly present.  Cardiovascular: Normal rate, regular rhythm and normal heart sounds.  No murmur heard. No BLE edema. Pulmonary/Chest: Effort normal and breath sounds normal. No respiratory distress. Musculoskeletal: Normal range of motion, no joint effusions. No gross deformities Neurological: Pt is alert and oriented to person, place, and time. No cranial nerve deficit. Coordination, balance, strength, speech and gait are normal.  Skin: Skin is warm and dry. No rash noted. There is a small cystic lesion just  medially to the right breast that is firm, moderately erythematous, without exudate and with tenderness. Psychiatric: Patient has a normal mood and affect. behavior is normal. Judgment and thought content normal.  No results found for this or any previous visit (from the past 72 hour(s)).  PHQ2/9: Depression screen [facility] 2/9 07/21/2018 06/29/2018 01/22/2018 01/14/2018 08/13/2017  Decreased Interest 1 0 0 0 0  Down, Depressed, Hopeless 1 0 0 0 0  PHQ - 2 Score 2 0 0 0 0  Altered sleeping 0 0 0 - -  Tired, decreased energy 0 0 1 - -  Change in appetite 0 0 0 - -  Feeling bad or failure about yourself  0 0 0 - -  Trouble concentrating 0 0 0 - -  Moving slowly or fidgety/restless 0 0 0 - -  Suicidal thoughts 0 0 0 - -  PHQ-9 Score 2 0 1 - -  Difficult doing work/chores Somewhat difficult Not difficult at all Very difficult - -   Fall Risk: Fall Risk  07/21/2018 06/29/2018 01/14/2018 08/13/2017  07/30/2017  Falls in the past year? 0 0 No No No  Number falls in past yr: 0 - - - -  Injury with Fall? 0 - - - -   Assessment & Plan  1. Tinnitus of both ears - D/C'd tamoxifen x2 weeks, if this does not help, will restart this medication and trial off of Buspar.  2. Scalp tenderness Improving with removing pressure from head w/ pillow when sitting in recliner  3. Vitamin D deficiency Needs DEXA scan; taking supplement  4. Anxiety Keep psychiatry follow up  5. Infected sebaceous cyst of skin - doxycycline (VIBRA-TABS) 100 MG tablet; Take 1 tablet (100 mg total) by mouth 2 (two) times daily.  Dispense: 20 tablet; Refill: 0 - Will call her dermatologist to set up evaluation for possible removal.  6. Bilateral primary osteoarthritis of knee - Discussed long-term use of NSAIDS and the risk this carries, she verbalizes understanding - is on lower dose now. - meloxicam (MOBIC) 7.5 MG tablet; Take 1 tablet (7.5 mg total) by mouth at bedtime.  Dispense: 90 tablet; Refill: 1  7. Need for Tdap  vaccination - Tdap vaccine greater than or equal to 7yo IM  8. Hyperlipidemia, unspecified hyperlipidemia type Lifestyle discussed; no medication  9. Toxic multinodular goiter Continue tapazole and keep follow up with endocrinology  10. Malignant neoplasm of upper-inner quadrant of left breast in female, estrogen receptor positive ([place]) - Continue to follow up with Oncology and general surgeon.  11. Corneal perforation of left eye  - Continue to follow up with specialist in [facility]  12. Hirsutism - Continue follow up with endocrinology  13. Morbid obesity with BMI of 40.0-44.9, adult ([place]) - Discussed importance of 150 minutes of physical activity weekly, eat two servings of fish weekly, eat one serving of tree nuts ( cashews, pistachios, pecans, almonds.Marland Kitchen) every other day, eat 6 servings of fruit/vegetables daily and drink plenty of water and avoid sweet beverages.   10. Long term (current) use of non-steroidal anti-inflammatories ([place]) - Discussed long-term use of NSAIDS and the risk this carries, she verbalizes understanding - is on lower dose now.  15. Long-term current use of tamoxifen - Stopping temporarily as above.

## 2018-07-22 DIAGNOSIS — H903 Sensorineural hearing loss, bilateral: Secondary | ICD-10-CM | POA: Diagnosis not present

## 2018-07-22 DIAGNOSIS — H9313 Tinnitus, bilateral: Secondary | ICD-10-CM | POA: Diagnosis not present

## 2018-07-22 DIAGNOSIS — M170 Bilateral primary osteoarthritis of knee: Secondary | ICD-10-CM | POA: Diagnosis not present

## 2018-07-24 ENCOUNTER — Encounter: Payer: Self-pay | Admitting: Nurse Practitioner

## 2018-07-24 ENCOUNTER — Ambulatory Visit: Payer: Self-pay

## 2018-07-24 ENCOUNTER — Ambulatory Visit: Payer: Medicaid Other | Admitting: Nurse Practitioner

## 2018-07-24 VITALS — BP 128/78 | HR 96 | Temp 98.8°F | Resp 16 | Ht 60.0 in | Wt 210.4 lb

## 2018-07-24 DIAGNOSIS — L720 Epidermal cyst: Secondary | ICD-10-CM

## 2018-07-24 DIAGNOSIS — R232 Flushing: Secondary | ICD-10-CM

## 2018-07-24 NOTE — Patient Instructions (Signed)
-   Facial flushing is a common side effect to cortisone injections; you can take an antihistamine such as (Claritin, Zyrtec, Xyzal) take this once daily for the next week and longer if needed.  You can additionally take famotidine 20 mg daily to help decrease your allergic symptoms sooner. -Continue taking your antibiotic as prescribed until you have completed the course.  Follow-up with dermatology on Monday.  The cyst between your breast looks good and not infected at the time.

## 2018-07-24 NOTE — Progress Notes (Signed)
Name: Sheena Martinez   MRN: [id number]    DOB: 01/22/1961   Date:07/24/2018       Progress Note  Subjective  Chief Complaint  Chief Complaint  Patient presents with  . Allergic Reaction    patient got 2 cortisone injections and was placed on Doxy so she thinks it may be an interaction    HPI  Patient noted facial flushing after cortisone shot on Wednesday. Face is still flushed. Patient had infected breast cyst and is taking doxycycline still on this medication- no issues. Monday has appointment with derm to get cyst removed.    Patient Active Problem List   Diagnosis Date Noted  . 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 Irregular menstrual cycle 03/02/2018  . Screening for cervical cancer 03/02/2018  . Long-term current use of tamoxifen 02/17/2018  . 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 Anxiety 10/03/2017  . Bilateral primary osteoarthritis of knee 10/03/2017  . 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 Malignant neoplasm of upper-inner quadrant of left breast in female, estrogen receptor positive ([place]) 05/22/2017  . Abnormal finding on thyroid function test 11/24/2015  . Hirsutism 11/24/2015  . Hyperlipidemia 02/01/2014  . Impaired glucose tolerance 02/01/2014  . Vitamin D deficiency 02/01/2014  . Family history of diabetes mellitus 01/31/2014  . Morbid obesity with BMI of 40.0-44.9, adult ([place]) 01/31/2014  . Sjogren's syndrome ([place]) 01/17/2014    Past Medical History:  Diagnosis Date  . Anxiety   . Arthritis   . Breast cancer ([place]) 05/12/2017   INVASIVE MAMMARY CARCINOMA ER/PR positive LEFT BREAST UPPER inner  QUAD  . Colon cancer ([place])   . GERD (gastroesophageal reflux disease)    OCC  . Goiter   . Hyperthyroidism   . Personal history of radiation therapy   . Sjogren's syndrome [facility])     Past Surgical History:  Procedure Laterality Date  . BREAST BIOPSY Left 05/12/2017   [place] bx/ INVASIVE MAMMARY  CARCINOMA  . BREAST LUMPECTOMY Left 05/2017   invasive mammary carcinoma, neg margins  . BREAST LUMPECTOMY WITH SENTINEL LYMPH NODE BIOPSY Left 06/10/2017   Procedure: BREAST LUMPECTOMY WITH SENTINEL LYMPH NODE BX AND NEEDLE LOCALIZATION;  Surgeon: Robert Bellow, MD;  Location: [facility] ORS;  Service: General;  Laterality: Left;  . BREAST MAMMOSITE Left 06/24/2017   Procedure: MAMMOSITE BREAST;  Surgeon: Robert Bellow, MD;  Location: [facility] ORS;  Service: General;  Laterality: Left;  . CESAREAN SECTION  1995  . 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 COSMETIC SURGERY    . CYST EXCISION  2018   pilar cyst/ Dr Will Bonnet  . EYE SURGERY    . PORTACATH PLACEMENT Right 07/08/2017   Procedure: INSERTION PORT-A-CATH;  Surgeon: Robert Bellow, MD;  Location: [facility] ORS;  Service: General;  Laterality: Right;  . TONSILLECTOMY      Social History   Tobacco Use  . Smoking status: Former Smoker    Years: 7.00    Types: Cigarettes    Last attempt to quit: 08/13/1979    Years since quitting: 38.9  . Smokeless tobacco: Never Used  . Tobacco comment: AGE [age]  Substance Use Topics  . Alcohol use: No     Current Outpatient Medications:  .  acetaminophen (TYLENOL) 500 MG tablet, Take 1,000 mg by mouth every 8 (eight) hours as needed for mild pain or headache., Disp: , Rfl:  .  brimonidine (ALPHAGAN) 0.2 % ophthalmic solution, INSTILL  1 DROP INTO LEFT EYE THREE TIMES DAILY, Disp: , Rfl: 12 .  busPIRone (BUSPAR) 7.5 MG tablet, Take by mouth., Disp: , Rfl:  .  Ca Carbonate-Mag Hydroxide (ROLAIDS PO), Take 1 tablet as needed by mouth (indigestion). , Disp: , Rfl:  .  carboxymethylcellulose 1 % ophthalmic solution, Place 1 drop into the left eye 4 (four) times daily., Disp: , Rfl:  .  cholecalciferol (VITAMIN D) 1000 units tablet, Take 2,000 Units by mouth daily., Disp: , Rfl:  .  doxycycline (VIBRA-TABS) 100 MG tablet, Take 1 tablet (100 mg total) by mouth 2 (two) times daily., Disp: 20 tablet, Rfl:  0 .  ketoconazole (NIZORAL) 2 % shampoo, Apply 1 application topically 2 (two) times a week., Disp: , Rfl:  .  LORazepam (ATIVAN) 0.5 MG tablet, , Disp: , Rfl: 0 .  meloxicam (MOBIC) 7.5 MG tablet, Take 1 tablet (7.5 mg total) by mouth at bedtime., Disp: 90 tablet, Rfl: 1 .  methimazole (TAPAZOLE) 10 MG tablet, TAKE 1 TABLET BY MOUTH ONCE DAILY AT 6AM, Disp: , Rfl: 2 .  prednisoLONE acetate (PRED FORTE) 1 % ophthalmic suspension, , Disp: , Rfl: 1 .  sertraline (ZOLOFT) 100 MG tablet, Take 100 mg by mouth daily., Disp: , Rfl: 1 .  vitamin C (ASCORBIC ACID) 500 MG tablet, Take 500 mg by mouth daily., Disp: , Rfl:  .  vitamin k 100 MCG tablet, Take 100 mcg by mouth 2 (two) times a week., Disp: , Rfl:   Allergies  Allergen Reactions  . Klonopin [Clonazepam]   . Sulfamethoxazole-Trimethoprim Rash    Chest tightness. Bactrim    ROS   No other specific complaints in a complete review of systems (except as listed in HPI above).  Objective  Vitals:   07/24/18 1333  BP: 128/78  Pulse: 96  Resp: 16  Temp: 98.8 F (37.1 C)  TempSrc: Oral  SpO2: 96%  Weight: 210 lb 6.4 oz (95.4 kg)  Height: 5' (1.524 m)    Body mass index is 41.09 kg/m.  Nursing Note and Vital Signs reviewed.  Physical Exam Constitutional:      Appearance: Normal appearance.  HENT:     Head: Normocephalic and atraumatic.  Cardiovascular:     Rate and Rhythm: Normal rate and regular rhythm.  Pulmonary:     Effort: Pulmonary effort is normal.     Breath sounds: Normal breath sounds.  Skin:      Neurological:     General: No focal deficit present.     Mental Status: She is alert.  Psychiatric:        Mood and Affect: Mood normal.        Thought Content: Thought content normal.      No results found for this or any previous visit (from the past 48 hour(s)).  Assessment & Plan  1. Flushing reaction Facial flushing is a common side effect to cortisone injections; you can take an antihistamine such  as (Claritin, Zyrtec, Xyzal) take this once daily for the next week and longer if needed.  You can additionally take famotidine 20 mg daily to help decrease your allergic symptoms sooner.  2. Epidermoid cyst of skin of chest Continue taking your antibiotic as prescribed until you have completed the course.  Follow-up with dermatology on Monday.  The cyst between your breast looks good and not infected at the time.

## 2018-07-24 NOTE — Telephone Encounter (Signed)
Returned call to patient who is concerned because she has been taking doxycycline for an infected cyst between her breast. Wednesday she saw her orthopedic doctor and she had Cortizone injections to both knees.  Yesterday she has flushing.  She called her ortopedist office who informed her this could be a side effect of Cortizone.  Later last night she stared to feel chills and read that you should not take the Cortizone if you are having an infection. She is concerned that the chill are indicating infection. Pt states her Cyst looks better and is not painful anymore. Pt has appointment with dermatology on Monday to have cyst removed. Appointment scheduled per protocol.  Care advice read to patient.  Reason for Disposition . [1] Taking antibiotic > 72 hours (3 days) AND [2] symptoms (other than fever) not improved  Answer Assessment - Initial Assessment Questions 1. INFECTION: "What infection is the antibiotic being given for?"     Cyst infected between breast 2. ANTIBIOTIC: "What antibiotic are you taking" "How many times per day?"     Doxycycline 3. DURATION: "When was the antibiotic started?"     Monday 07/20/18 4. MAIN CONCERN OR SYMPTOM:  "What is your main concern right now?"    intererance with antibiotic from cortizone injection 5. BETTER-SAME-WORSE: "Are you getting better, staying the same, or getting worse compared to when you first started the antibiotics?" If getting worse, ask: "In what way?"      improving 6. FEVER: "Do you have a fever?" If so, ask: "What is your temperature, how was it measured, and when did it start?"     chills 7. SYMPTOMS: "Are there any other symptoms you're concerned about?" If so, ask: "When did it start?"     Last night after cortisone injection in both knees 8. FOLLOW-UP APPOINTMENT: "Do you have a follow-up appointment with your doctor?"     F/U dermatology monday  Protocols used: INFECTION ON ANTIBIOTIC FOLLOW-UP CALL-A-AH

## 2018-07-27 DIAGNOSIS — L720 Epidermal cyst: Secondary | ICD-10-CM | POA: Diagnosis not present

## 2018-07-27 DIAGNOSIS — L918 Other hypertrophic disorders of the skin: Secondary | ICD-10-CM | POA: Diagnosis not present

## 2018-07-27 NOTE — Telephone Encounter (Signed)
Documentation reviewed.  Pt saw Benjamine Mola NP-C same day 07/24/2018 for evaluation.

## 2018-08-14 ENCOUNTER — Other Ambulatory Visit: Payer: Self-pay

## 2018-08-14 DIAGNOSIS — H16002 Unspecified corneal ulcer, left eye: Secondary | ICD-10-CM | POA: Diagnosis not present

## 2018-08-14 DIAGNOSIS — Z947 Corneal transplant status: Secondary | ICD-10-CM | POA: Diagnosis not present

## 2018-08-14 DIAGNOSIS — H11821 Conjunctivochalasis, right eye: Secondary | ICD-10-CM | POA: Diagnosis not present

## 2018-08-17 ENCOUNTER — Other Ambulatory Visit: Payer: Self-pay | Admitting: *Deleted

## 2018-08-17 DIAGNOSIS — E0590 Thyrotoxicosis, unspecified without thyrotoxic crisis or storm: Secondary | ICD-10-CM | POA: Diagnosis not present

## 2018-08-17 DIAGNOSIS — C50212 Malignant neoplasm of upper-inner quadrant of left female breast: Secondary | ICD-10-CM | POA: Diagnosis not present

## 2018-08-17 DIAGNOSIS — H16002 Unspecified corneal ulcer, left eye: Secondary | ICD-10-CM | POA: Diagnosis not present

## 2018-08-17 DIAGNOSIS — Z947 Corneal transplant status: Secondary | ICD-10-CM | POA: Diagnosis not present

## 2018-08-17 DIAGNOSIS — E0520 Thyrotoxicosis with toxic multinodular goiter without thyrotoxic crisis or storm: Secondary | ICD-10-CM | POA: Diagnosis not present

## 2018-08-17 DIAGNOSIS — I10 Essential (primary) hypertension: Secondary | ICD-10-CM | POA: Diagnosis not present

## 2018-08-17 DIAGNOSIS — Z170 Estrogen receptor positive status [ER+]: Secondary | ICD-10-CM | POA: Diagnosis not present

## 2018-08-17 DIAGNOSIS — M3501 Sicca syndrome with keratoconjunctivitis: Secondary | ICD-10-CM | POA: Diagnosis not present

## 2018-08-19 ENCOUNTER — Other Ambulatory Visit: Payer: Self-pay

## 2018-08-19 ENCOUNTER — Inpatient Hospital Stay (HOSPITAL_BASED_OUTPATIENT_CLINIC_OR_DEPARTMENT_OTHER): Payer: Medicaid Other | Admitting: Oncology

## 2018-08-19 ENCOUNTER — Encounter: Payer: Self-pay | Admitting: Oncology

## 2018-08-19 ENCOUNTER — Inpatient Hospital Stay: Payer: Medicaid Other | Attending: Oncology

## 2018-08-19 VITALS — BP 113/80 | HR 81 | Temp 97.2°F | Resp 18 | Wt 213.0 lb

## 2018-08-19 DIAGNOSIS — E0590 Thyrotoxicosis, unspecified without thyrotoxic crisis or storm: Secondary | ICD-10-CM

## 2018-08-19 DIAGNOSIS — H5789 Other specified disorders of eye and adnexa: Secondary | ICD-10-CM | POA: Diagnosis not present

## 2018-08-19 DIAGNOSIS — Z87891 Personal history of nicotine dependence: Secondary | ICD-10-CM

## 2018-08-19 DIAGNOSIS — M3500 Sicca syndrome, unspecified: Secondary | ICD-10-CM | POA: Insufficient documentation

## 2018-08-19 DIAGNOSIS — Z79810 Long term (current) use of selective estrogen receptor modulators (SERMs): Secondary | ICD-10-CM

## 2018-08-19 DIAGNOSIS — Z79899 Other long term (current) drug therapy: Secondary | ICD-10-CM | POA: Diagnosis not present

## 2018-08-19 DIAGNOSIS — R232 Flushing: Secondary | ICD-10-CM | POA: Insufficient documentation

## 2018-08-19 DIAGNOSIS — Z170 Estrogen receptor positive status [ER+]: Secondary | ICD-10-CM | POA: Diagnosis not present

## 2018-08-19 DIAGNOSIS — Z95828 Presence of other vascular implants and grafts: Secondary | ICD-10-CM

## 2018-08-19 DIAGNOSIS — H16002 Unspecified corneal ulcer, left eye: Secondary | ICD-10-CM

## 2018-08-19 DIAGNOSIS — F411 Generalized anxiety disorder: Secondary | ICD-10-CM

## 2018-08-19 DIAGNOSIS — C50212 Malignant neoplasm of upper-inner quadrant of left female breast: Secondary | ICD-10-CM | POA: Insufficient documentation

## 2018-08-19 DIAGNOSIS — Z791 Long term (current) use of non-steroidal anti-inflammatories (NSAID): Secondary | ICD-10-CM

## 2018-08-19 LAB — CBC WITH DIFFERENTIAL/PLATELET
Abs Immature Granulocytes: 0.01 10*3/uL (ref 0.00–0.07)
BASOS ABS: 0 10*3/uL (ref 0.0–0.1)
BASOS PCT: 0 %
EOS ABS: 0.1 10*3/uL (ref 0.0–0.5)
EOS PCT: 2 %
HCT: 39 % (ref 36.0–46.0)
Hemoglobin: 12.4 g/dL (ref 12.0–15.0)
Immature Granulocytes: 0 %
LYMPHS PCT: 32 %
Lymphs Abs: 1.6 10*3/uL (ref 0.7–4.0)
MCH: 31 pg (ref 26.0–34.0)
MCHC: 31.8 g/dL (ref 30.0–36.0)
MCV: 97.5 fL (ref 80.0–100.0)
MONO ABS: 0.5 10*3/uL (ref 0.1–1.0)
Monocytes Relative: 11 %
NEUTROS ABS: 2.8 10*3/uL (ref 1.7–7.7)
Neutrophils Relative %: 55 %
PLATELETS: 295 10*3/uL (ref 150–400)
RBC: 4 MIL/uL (ref 3.87–5.11)
RDW: 13.1 % (ref 11.5–15.5)
WBC: 5.1 10*3/uL (ref 4.0–10.5)
nRBC: 0 % (ref 0.0–0.2)

## 2018-08-19 LAB — COMPREHENSIVE METABOLIC PANEL
ALBUMIN: 3.5 g/dL (ref 3.5–5.0)
ALT: 12 U/L (ref 0–44)
AST: 14 U/L — AB (ref 15–41)
Alkaline Phosphatase: 71 U/L (ref 38–126)
Anion gap: 6 (ref 5–15)
BUN: 16 mg/dL (ref 6–20)
CHLORIDE: 109 mmol/L (ref 98–111)
CO2: 27 mmol/L (ref 22–32)
Calcium: 8.9 mg/dL (ref 8.9–10.3)
Creatinine, Ser: 0.76 mg/dL (ref 0.44–1.00)
GFR calc Af Amer: >60 mL/min (ref >60)
Glucose, Bld: 112 mg/dL — ABNORMAL HIGH (ref 70–99)
POTASSIUM: 3.9 mmol/L (ref 3.5–5.1)
SODIUM: 142 mmol/L (ref 135–145)
Total Bilirubin: 0.6 mg/dL (ref 0.3–1.2)
Total Protein: 6.2 g/dL — ABNORMAL LOW (ref 6.5–8.1)

## 2018-08-19 MED ORDER — SODIUM CHLORIDE 0.9% FLUSH
10.0000 mL | Freq: Once | INTRAVENOUS | Status: AC
  Administered 2018-08-19: 10 mL via INTRAVENOUS
  Filled 2018-08-19: qty 10

## 2018-08-19 MED ORDER — HEPARIN SOD (PORK) LOCK FLUSH 100 UNIT/ML IV SOLN
500.0000 [IU] | Freq: Once | INTRAVENOUS | Status: AC
  Administered 2018-08-19: 500 [IU] via INTRAVENOUS

## 2018-08-19 NOTE — Progress Notes (Signed)
Patient here for follow up

## 2018-08-19 NOTE — Progress Notes (Signed)
Hematology/Oncology Follow up note [facility] [phone] [phone]   Patient Care Team: Hubbard Hartshorn, FNP as PCP - General (Family Medicine) Rico Junker, RN as Registered Nurse Theodore Demark, RN as Registered Nurse Byrnett, Forest Gleason, MD (General Surgery) Noreene Filbert, MD as Referring Physician (Radiation Oncology) Earlie Server, MD as Consulting Physician (Oncology) Shon Hough, MD as Referring Physician (Endocrinology)  REFERRING PROVIDER: Hubbard Hartshorn, FNP REASON FOR VISIT Follow up for breast cancer  HISTORY OF PRESENTING ILLNESS:  Sheena Martinez is a  58 y.o.  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 Margin is negative, no LVI. Mammoprintr revealed 10 year recurrence rate at 29% high risk group. Patient has completed MammoSite RT. Denies any hormonal replacement therapy.  Adjuvant chemotherapy with TC, finished 3 cycles.  She is perimenopausal.   # His Sjogren disease for which she has had tear duct surgery. She also reports history of PCOS with elevated testosterone level.   Genetic testing:  Genetic testing result vis [facility] showed no pathogenic sequence appearance or deletions /duplications.  # she developed corneal ulcer for which she has to undergo emergency ophthalmology surgery.  This is considered to be related to Sjogren's syndrome.  Patient has been started on prednisone by ophthalmologist.  Her anxiety has not been well controlled lately due to her ophthalmology problems.  Patient has been on Paxil for a long time and recently switched to Lexapro as Paxil interfere with efficacy of tamoxifen.  She feels her anxiety is not well controlled.  Current Treatment Adjuvant TC x3. Patient declined cycle 4.  Started on Tamoxifen in March 2019.   INTERVAL HISTORY Patient  presented for follow up for breast cancer. Patient currently takes tamoxifen 20  mg daily.  Has manageable hot flash.  Tolerating well. She continues to follow-up with ophthalmologist for her eye problems.  Per patient, recently her eye pressure has been high  Hyperthyroidism, on methimazole.  Follows up with endocrinology. Anxiety, BuSpar was stopped due to tinnitus.  Currently she is on Zoloft, Ativan, follows up with [facility] psychiatrist Dr. Maree Erie Register. She feels anxiety is well controlled. Denies any fever, chills, chest pain, shortness of breath, abdominal pain, back pain, lower extremity swelling. Denies any concern of her breast.  She has had bilateral diagnostic mammogram done on 05/04/2018 which did not show malignant findings.  Review of Systems  Constitutional: Negative for appetite change, chills, fatigue and fever.  HENT:   Negative for hearing loss and voice change.   Eyes: Negative for eye problems.  Respiratory: Negative for chest tightness and cough.   Cardiovascular: Negative for chest pain.  Gastrointestinal: Negative for abdominal distention, abdominal pain and blood in stool.  Endocrine: Positive for hot flashes.  Genitourinary: Negative for difficulty urinating and frequency.   Musculoskeletal: Positive for arthralgias.  Skin: Negative for itching and rash.  Neurological: Negative for extremity weakness.  Hematological: Negative for adenopathy.  Psychiatric/Behavioral: Negative for confusion. The patient is nervous/anxious.     MEDICAL HISTORY:  Past Medical History:  Diagnosis Date  . Anxiety   . Arthritis   . Breast cancer ([place]) 05/12/2017   INVASIVE MAMMARY CARCINOMA ER/PR positive LEFT BREAST UPPER inner  QUAD  . Colon cancer ([place])   . GERD (gastroesophageal reflux disease)    OCC  . Goiter   . Hyperthyroidism   . Personal history of radiation therapy   . Sjogren's syndrome ([place])  SURGICAL HISTORY: Past Surgical History:  Procedure Laterality Date  . BREAST BIOPSY Left 05/12/2017   [place] bx/ INVASIVE MAMMARY CARCINOMA  .  BREAST LUMPECTOMY Left 05/2017   invasive mammary carcinoma, neg margins  . BREAST LUMPECTOMY WITH SENTINEL LYMPH NODE BIOPSY Left 06/10/2017   Procedure: BREAST LUMPECTOMY WITH SENTINEL LYMPH NODE BX AND NEEDLE LOCALIZATION;  Surgeon: Robert Bellow, MD;  Location: [facility] ORS;  Service: General;  Laterality: Left;  . BREAST MAMMOSITE Left 06/24/2017   Procedure: MAMMOSITE BREAST;  Surgeon: Robert Bellow, MD;  Location: [facility] ORS;  Service: General;  Laterality: Left;  . CESAREAN SECTION  1995  . 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 COSMETIC SURGERY    . CYST EXCISION  2018   pilar cyst/ Dr Will Bonnet  . EYE SURGERY    . PORTACATH PLACEMENT Right 07/08/2017   Procedure: INSERTION PORT-A-CATH;  Surgeon: Robert Bellow, MD;  Location: [facility] ORS;  Service: General;  Laterality: Right;  . TONSILLECTOMY      SOCIAL HISTORY: Social History   Socioeconomic History  . Marital status: Married    Spouse name: Eulas Post  . Number of children: 3  . Years of education: Not on file  . Highest education level: GED or equivalent  Occupational History  . Occupation: unemployed  Social Needs  . Financial resource strain: Not on file  . Food insecurity:    Worry: Never true    Inability: Never true  . Transportation needs:    Medical: No    Non-medical: No  Tobacco Use  . Smoking status: Former Smoker    Years: 7.00    Types: Cigarettes    Last attempt to quit: 08/13/1979    Years since quitting: 39.0  . Smokeless tobacco: Never Used  . Tobacco comment: AGE [age]  Substance and Sexual Activity  . Alcohol use: No  . Drug use: No  . Sexual activity: Yes  Lifestyle  . Physical activity:    Days per week: 0 days    Minutes per session: 0 min  . Stress: Very much  Relationships  . Social connections:    Talks on phone: More than three times a week    Gets together: More than three times a week    Attends religious service: More than 4 times per year    Active member of club or  organization: Yes    Attends meetings of clubs or organizations: More than 4 times per year    Relationship status: Married  . Intimate partner violence:    Fear of current or ex partner: No    Emotionally abused: No    Physically abused: No    Forced sexual activity: No  Other Topics Concern  . Not on file  Social History Narrative  . Not on file    FAMILY HISTORY: Family History  Problem Relation Age of Onset  . Breast cancer Maternal Aunt 8       currently ~65  . Diabetes Mother   . Skin cancer Mother        currently 32; TAH/BSO (age?)  . Anemia Mother   . Liver disease Father        deceased / not much info about him / alcoholic  . Lung cancer Paternal Aunt        smoker / deceased 55s  . Stomach cancer Paternal Uncle 20       deceased / deceased 53s  . Ovarian cancer Maternal Grandmother 54  deceased 40s  . Stomach cancer Paternal Grandfather 37       deceased 39s  . Cancer Maternal Uncle        unk. type; deceased 41s  . Leukemia Cousin 85       deceased 46    ALLERGIES:  is allergic to benadryl [diphenhydramine]; klonopin [clonazepam]; and sulfamethoxazole-trimethoprim.  MEDICATIONS:  Current Outpatient Medications  Medication Sig Dispense Refill  . acetaminophen (TYLENOL) 500 MG tablet Take 1,000 mg by mouth every 8 (eight) hours as needed for mild pain or headache.    . brimonidine (ALPHAGAN) 0.2 % ophthalmic solution INSTILL 1 DROP INTO LEFT EYE THREE TIMES DAILY  12  . Ca Carbonate-Mag Hydroxide (ROLAIDS PO) Take 1 tablet as needed by mouth (indigestion).     . cholecalciferol (VITAMIN D) 1000 units tablet Take 2,000 Units by mouth daily.    . cycloSPORINE (RESTASIS) 0.05 % ophthalmic emulsion INSTILL 1 DROP INTO EACH EYE TWICE DAILY    . ketoconazole (NIZORAL) 2 % shampoo Apply 1 application topically 2 (two) times a week.    Marland Kitchen LORazepam (ATIVAN) 0.5 MG tablet   0  . meloxicam (MOBIC) 7.5 MG tablet Take 1 tablet (7.5 mg total) by mouth at bedtime.  90 tablet 1  . methimazole (TAPAZOLE) 10 MG tablet TAKE 1 TABLET BY MOUTH ONCE DAILY AT 6AM  2  . prednisoLONE acetate (PRED FORTE) 1 % ophthalmic suspension   1  . sertraline (ZOLOFT) 100 MG tablet Take 100 mg by mouth daily.  1  . tamoxifen (NOLVADEX) 20 MG tablet Take 20 mg by mouth daily.    . timolol (TIMOPTIC) 0.5 % ophthalmic solution Administer 1 drop into the left eye Two (2) times a day.    . vitamin C (ASCORBIC ACID) 500 MG tablet Take 500 mg by mouth daily.    . vitamin k 100 MCG tablet Take 100 mcg by mouth 2 (two) times a week.    . busPIRone (BUSPAR) 7.5 MG tablet Take by mouth.    . carboxymethylcellulose 1 % ophthalmic solution Place 1 drop into the left eye 4 (four) times daily.     No current facility-administered medications for this visit.       Marland Kitchen  PHYSICAL EXAMINATION: ECOG PERFORMANCE STATUS: 0 - Asymptomatic Vitals:   08/19/18 1351  BP: 113/80  Pulse: 81  Resp: 18  Temp: (!) 97.2 F (36.2 C)   Physical Exam  Constitutional: She is oriented to person, place, and time. No distress.  HENT:  Head: Normocephalic and atraumatic.  Nose: Nose normal.  Mouth/Throat: Oropharynx is clear and moist. No oropharyngeal exudate.  Eyes: Pupils are equal, round, and reactive to light. Conjunctivae and EOM are normal. No scleral icterus.  Neck: Normal range of motion. Neck supple. No thyromegaly present.  Cardiovascular: Normal rate, regular rhythm and normal heart sounds.  No murmur heard. Pulmonary/Chest: Effort normal and breath sounds normal. No respiratory distress. She has no wheezes. She has no rales. She exhibits no tenderness.  Abdominal: Soft. Bowel sounds are normal. She exhibits no distension. There is no abdominal tenderness.  Musculoskeletal: Normal range of motion.        General: No edema.  Lymphadenopathy:    She has no cervical adenopathy.  Neurological: She is alert and oriented to person, place, and time. No cranial nerve deficit. She exhibits  normal muscle tone. Coordination normal.  Skin: Skin is warm and dry. No rash noted. She is not diaphoretic. No erythema.  Psychiatric: Affect  normal.   LABORATORY DATA:  I have reviewed the data as listed: no recent labs.  Lab Results  Component Value Date   WBC 5.1 08/19/2018   HGB 12.4 08/19/2018   HCT 39.0 08/19/2018   MCV 97.5 08/19/2018   PLT 295 08/19/2018   Recent Labs    11/18/17 1029 02/17/18 1141 08/19/18 1326  NA 140 140 142  K 3.5 4.2 3.9  CL 108 108 109  CO2 '23 25 27  '$ GLUCOSE 113* 110* 112*  BUN '16 18 16  '$ CREATININE 0.73 0.70 0.76  CALCIUM 9.1 9.1 8.9  GFRNONAA >60 >60 >60  GFRAA >60 >60 >60  PROT 6.1* 6.6 6.2*  ALBUMIN 3.3* 3.6 3.5  AST 24 18 14*  ALT '21 14 12  '$ ALKPHOS 51 57 71  BILITOT 0.7 0.5 0.6    RADIOGRAPHIC STUDIES: I have personally reviewed the radiological images as listed and agreed with the findings in the report. 05/04/2018 bilateral diagnostic mammogram benign finding.  ASSESSMENT & PLAN:  58 yo premenopausal female with Stage 1A breast cancer, high risk mammaprint index currently on adjuvant chemotherapy with TC. Cancer Staging Malignant neoplasm of upper-inner quadrant of left breast in female, estrogen receptor positive ([place]) Staging form: Breast, AJCC 8th Edition - Clinical stage from 05/22/2017: Stage IA (cT1, cN0, cM0, G1, ER: Positive, PR: Positive, HER2: Negative) - Signed by Earlie Server, MD on 05/22/2017 - Pathologic stage from 07/09/2017: Stage IA (pT1b, pN0, cM0, G2, ER: Positive, PR: Positive, HER2: Negative) - Signed by Earlie Server, MD on 07/30/2017  1. Malignant neoplasm of upper-inner quadrant of left breast in female, estrogen receptor positive ([place])   2. Port-A-Cath in place   3. Long-term current use of tamoxifen   4. Hyperthyroidism   5. Generalized anxiety disorder   6. Cornea ulcer, left     # Breast cancer, tolerating tamoxifen without significant side effects. Declined option of ovarian suppression with aromatase  inhibitor as patient is concerned about having new side effects. Currently she feels that her chronic problem has been relatively well controlled.  Not willing to change. Continue tamoxifen 20 mg daily Annual diagnostic mammogram reviewed.  Benign findings. She has recently followed up with Dr. Donella Stade and Dr. Bary Castilla who both did breast exams. She also follows up with GYN annually.   # Anxiety: continue follow up with Dr.Shelby Register at [facility].  Currently on Zoloft and Ativan as needed.   # Hyperthyroidism: Per patient due to patient's ophthalmologic problems, plan of radioisotope ablation was put off.  She follows up with endocrinologist every 6 months.  On methimazole.  #She has not obtained dental clearance yet. 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 S/p 2 rituximab treatment, and a course of systemic and ophthalmology steroid.    # Medi port: was flushed today.  Continue Mediport flush every 6 weeks.  We discussed about keeping Mediport for another 6 months. We will discuss again if she wants to keep the Mediport at that time.   Return of visit: 4 months for reevaluation.  Check CBC CMP at that time.  Earlie Server, MD, PhD Hematology Oncology [facility] Pager- [phone] 08/19/2018

## 2018-08-21 DIAGNOSIS — L720 Epidermal cyst: Secondary | ICD-10-CM | POA: Diagnosis not present

## 2018-08-21 DIAGNOSIS — D485 Neoplasm of uncertain behavior of skin: Secondary | ICD-10-CM | POA: Diagnosis not present

## 2018-08-28 DIAGNOSIS — H04121 Dry eye syndrome of right lacrimal gland: Secondary | ICD-10-CM | POA: Diagnosis not present

## 2018-08-28 DIAGNOSIS — H11821 Conjunctivochalasis, right eye: Secondary | ICD-10-CM | POA: Diagnosis not present

## 2018-08-28 DIAGNOSIS — Z947 Corneal transplant status: Secondary | ICD-10-CM | POA: Diagnosis not present

## 2018-09-09 DIAGNOSIS — H40003 Preglaucoma, unspecified, bilateral: Secondary | ICD-10-CM | POA: Diagnosis not present

## 2018-09-16 ENCOUNTER — Ambulatory Visit
Admission: RE | Admit: 2018-09-16 | Discharge: 2018-09-16 | Disposition: A | Discharge status: Home / Self Care | Payer: Medicaid Other | Source: Ambulatory Visit | Attending: Family Medicine | Admitting: Family Medicine

## 2018-09-16 ENCOUNTER — Ambulatory Visit: Payer: Medicaid Other | Admitting: Family Medicine

## 2018-09-16 ENCOUNTER — Encounter: Payer: Self-pay | Admitting: Family Medicine

## 2018-09-16 ENCOUNTER — Other Ambulatory Visit: Payer: Self-pay

## 2018-09-16 VITALS — BP 126/84 | HR 88 | Temp 99.4°F | Resp 14 | Ht 60.0 in | Wt 214.1 lb

## 2018-09-16 DIAGNOSIS — M545 Low back pain, unspecified: Secondary | ICD-10-CM

## 2018-09-16 DIAGNOSIS — R319 Hematuria, unspecified: Secondary | ICD-10-CM | POA: Insufficient documentation

## 2018-09-16 DIAGNOSIS — N3001 Acute cystitis with hematuria: Secondary | ICD-10-CM

## 2018-09-16 LAB — POCT URINALYSIS DIPSTICK
Bilirubin, UA: NEGATIVE
Glucose, UA: NEGATIVE
Ketones, UA: NEGATIVE
Nitrite, UA: NEGATIVE
Protein, UA: POSITIVE — AB
SPEC GRAV UA: 1.015 (ref 1.010–1.025)
Urobilinogen, UA: NEGATIVE E.U./dL — AB
pH, UA: 5 (ref 5.0–8.0)

## 2018-09-16 MED ORDER — TIZANIDINE HCL 2 MG PO TABS: 2.0000 mg | ORAL_TABLET | Freq: Three times a day (TID) | ORAL | 0 refills | Status: DC | PRN

## 2018-09-16 MED ORDER — NITROFURANTOIN MONOHYD MACRO 100 MG PO CAPS: 100.0000 mg | ORAL_CAPSULE | Freq: Two times a day (BID) | ORAL | 0 refills | Status: AC

## 2018-09-16 NOTE — Progress Notes (Addendum)
Name: Sheena Martinez   MRN: [id number]    DOB: 09-03-1960   Date:09/16/2018       Progress Note  Subjective  Chief Complaint  Chief Complaint  Patient presents with  . Back Pain    low back pain for 1 week    HPI  Pt presents with concern for low back pain.  She did have an episode of LEFT sided back pain about 1 month ago that resolved on its own. Her current pain started about a week ago - had some lower abdominal pain, body aches, fatigue, and mild nausea, pressure with urination.  Denies vomiting or diarrhea, no dysuria, hematuria, or urinary frequency, no BLE weakness/numbness/tingling.   Patient Active Problem List   Diagnosis Date Noted  . 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 Irregular menstrual cycle 03/02/2018  . Screening for cervical cancer 03/02/2018  . Long-term current use of tamoxifen 02/17/2018  . 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 Anxiety 10/03/2017  . Bilateral primary osteoarthritis of knee 10/03/2017  . 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 Malignant neoplasm of upper-inner quadrant of left breast in female, estrogen receptor positive ([place]) 05/22/2017  . Abnormal finding on thyroid function test 11/24/2015  . Hirsutism 11/24/2015  . Hyperlipidemia 02/01/2014  . Impaired glucose tolerance 02/01/2014  . Vitamin D deficiency 02/01/2014  . Family history of diabetes mellitus 01/31/2014  . Morbid obesity with BMI of 40.0-44.9, adult ([place]) 01/31/2014  . Sjogren's syndrome ([place]) 01/17/2014    Social History   Tobacco Use  . Smoking status: Former Smoker    Years: 7.00    Types: Cigarettes    Last attempt to quit: 08/13/1979    Years since quitting: 39.1  . Smokeless tobacco: Never Used  . Tobacco comment: AGE [age]  Substance Use Topics  . Alcohol use: No     Current Outpatient Medications:  .  acetaminophen (TYLENOL) 500 MG tablet, Take 1,000 mg by mouth every 8 (eight) hours as needed  for mild pain or headache., Disp: , Rfl:  .  brimonidine (ALPHAGAN) 0.2 % ophthalmic solution, INSTILL 1 DROP INTO LEFT EYE THREE TIMES DAILY, Disp: , Rfl: 12 .  Ca Carbonate-Mag Hydroxide (ROLAIDS PO), Take 1 tablet as needed by mouth (indigestion). , Disp: , Rfl:  .  carboxymethylcellulose 1 % ophthalmic solution, Place 1 drop into the left eye 4 (four) times daily., Disp: , Rfl:  .  cholecalciferol (VITAMIN D) 1000 units tablet, Take 2,000 Units by mouth daily., Disp: , Rfl:  .  cycloSPORINE (RESTASIS) 0.05 % ophthalmic emulsion, INSTILL 1 DROP INTO EACH EYE TWICE DAILY, Disp: , Rfl:  .  ketoconazole (NIZORAL) 2 % shampoo, Apply 1 application topically 2 (two) times a week., Disp: , Rfl:  .  LORazepam (ATIVAN) 0.5 MG tablet, , Disp: , Rfl: 0 .  methimazole (TAPAZOLE) 10 MG tablet, TAKE 1 TABLET BY MOUTH ONCE DAILY AT 6AM, Disp: , Rfl: 2 .  prednisoLONE acetate (PRED FORTE) 1 % ophthalmic suspension, , Disp: , Rfl: 1 .  sertraline (ZOLOFT) 100 MG tablet, Take 100 mg by mouth daily., Disp: , Rfl: 1 .  tamoxifen (NOLVADEX) 20 MG tablet, Take 20 mg by mouth daily., Disp: , Rfl:  .  vitamin C (ASCORBIC ACID) 500 MG tablet, Take 500 mg by mouth daily., Disp: , Rfl:  .  vitamin k 100 MCG tablet, Take 100 mcg by mouth 2 (two) times a week., Disp: , Rfl:  .  nitrofurantoin, macrocrystal-monohydrate, (MACROBID) 100 MG capsule, Take 1 capsule (100 mg total) by mouth 2 (two) times daily for 5 days., Disp: 10 capsule, Rfl: 0 .  tiZANidine (ZANAFLEX) 2 MG tablet, Take 1-2 tablets (2-4 mg total) by mouth every 8 (eight) hours as needed for muscle spasms., Disp: 30 tablet, Rfl: 0  Allergies  Allergen Reactions  . Benadryl [Diphenhydramine] Other (See Comments)    Patient felt like she was going to "crawl out of her skin"  . Klonopin [Clonazepam]   . Sulfamethoxazole-Trimethoprim Rash    Chest tightness. Bactrim    I personally reviewed active problem list, medication list, allergies, notes from last  encounter, lab results with the patient/caregiver today.  ROS  Ten systems reviewed and is negative except as mentioned in HPI.  Objective  Vitals:   09/16/18 1325  BP: 126/84  Pulse: 88  Resp: 14  Temp: 99.4 F (37.4 C)  TempSrc: Oral  SpO2: 99%  Weight: 214 lb 1.6 oz (97.1 kg)  Height: 5' (1.524 m)   Body mass index is 41.81 kg/m.  Nursing Note and Vital Signs reviewed.  Physical Exam  Constitutional: Patient appears well-developed and well-nourished. Obese. No distress.  HEENT: head atraumatic, normocephalic. Cardiovascular: Normal rate, regular rhythm and normal heart sounds.  No murmur heard. No BLE edema. Pulmonary/Chest: Effort normal and breath sounds clear bilaterally. No respiratory distress. Abdominal: Soft, bowel sounds normal, there is no tenderness, no HSM. No CVA tenderness. MSK: No limb weakness, no gross deformities.  Tenderness to the lumbar musculature. Psychiatric: Patient has a normal mood and affect. behavior is normal. Judgment and thought content normal.  Results for orders placed or performed in visit on 09/16/18 (from the past 72 hour(s))  POCT urinalysis dipstick     Status: Abnormal   Collection Time: 09/16/18  2:04 PM  Result Value Ref Range   Color, UA gold    Clarity, UA clear    Glucose, UA Negative Negative   Bilirubin, UA negative    Ketones, UA negative    Spec Grav, UA 1.015 1.010 - 1.025   Blood, UA moderate    pH, UA 5.0 5.0 - 8.0   Protein, UA Positive (A) Negative   Urobilinogen, UA negative (A) 0.2 or 1.0 E.U./dL   Nitrite, UA negative    Leukocytes, UA Moderate (2+) (A) Negative   Appearance clear    Odor none     Assessment & Plan  1. Acute cystitis with hematuria - nitrofurantoin, macrocrystal-monohydrate, (MACROBID) 100 MG capsule; Take 1 capsule (100 mg total) by mouth 2 (two) times daily for 5 days.  Dispense: 10 capsule; Refill: 0  2. Hematuria, unspecified type - Urine Culture - DG Abd 1 View; Future  3.  Acute bilateral low back pain without sciatica - tiZANidine (ZANAFLEX) 2 MG tablet; Take 1-2 tablets (2-4 mg total) by mouth every 8 (eight) hours as needed for muscle spasms.  Dispense: 30 tablet; Refill: 0  -Red flags and when to present for emergency care or RTC including fever >101.77F, chest pain, shortness of breath, new/worsening/un-resolving symptoms, vomiting, flank pain, malaise, reviewed with patient at time of visit. Follow up and care instructions discussed and provided in AVS.

## 2018-09-17 LAB — URINE CULTURE
MICRO NUMBER:: 156138
SPECIMEN QUALITY:: ADEQUATE

## 2018-09-18 ENCOUNTER — Encounter: Payer: Self-pay | Admitting: Emergency Medicine

## 2018-09-18 ENCOUNTER — Telehealth: Payer: Self-pay | Admitting: Emergency Medicine

## 2018-09-18 NOTE — Telephone Encounter (Signed)
Copied from [place] [phone]. Topic: General - Other >> Sep 18, 2018  9:38 AM Yvette Rack wrote: Reason for CRM: Pt stated she reviewed her test results and Raelyn Ensign asked how she is feeling. Pt requests that Raelyn Ensign returns her call. Cb# [phone]

## 2018-09-18 NOTE — Telephone Encounter (Signed)
Spoke directly with the patient, she needs to schedule 2 week CMA visit for UA dip.

## 2018-09-18 NOTE — Telephone Encounter (Signed)
Would like a call from you. Do not understand how blood is in her urine and she has no infection.

## 2018-09-18 NOTE — Telephone Encounter (Signed)
Patient scheduled to come on 09/28/2018 @ 2pm.

## 2018-09-23 DIAGNOSIS — Z947 Corneal transplant status: Secondary | ICD-10-CM | POA: Diagnosis not present

## 2018-09-23 DIAGNOSIS — H40052 Ocular hypertension, left eye: Secondary | ICD-10-CM | POA: Diagnosis not present

## 2018-09-23 DIAGNOSIS — H11821 Conjunctivochalasis, right eye: Secondary | ICD-10-CM | POA: Diagnosis not present

## 2018-09-23 DIAGNOSIS — H40003 Preglaucoma, unspecified, bilateral: Secondary | ICD-10-CM | POA: Diagnosis not present

## 2018-09-28 ENCOUNTER — Ambulatory Visit: Payer: Medicaid Other

## 2018-10-05 ENCOUNTER — Telehealth: Payer: Self-pay | Admitting: Family Medicine

## 2018-10-05 ENCOUNTER — Ambulatory Visit (INDEPENDENT_AMBULATORY_CARE_PROVIDER_SITE_OTHER): Payer: Medicaid Other | Admitting: Emergency Medicine

## 2018-10-05 ENCOUNTER — Other Ambulatory Visit: Payer: Self-pay | Admitting: Family Medicine

## 2018-10-05 DIAGNOSIS — M1711 Unilateral primary osteoarthritis, right knee: Secondary | ICD-10-CM

## 2018-10-05 DIAGNOSIS — N3001 Acute cystitis with hematuria: Secondary | ICD-10-CM

## 2018-10-05 DIAGNOSIS — R319 Hematuria, unspecified: Secondary | ICD-10-CM

## 2018-10-05 LAB — POCT URINALYSIS DIPSTICK
Bilirubin, UA: NEGATIVE
GLUCOSE UA: NEGATIVE
Ketones, UA: NEGATIVE
LEUKOCYTES UA: NEGATIVE
Nitrite, UA: NEGATIVE
Protein, UA: POSITIVE — AB
SPEC GRAV UA: 1.015 (ref 1.010–1.025)
Urobilinogen, UA: NEGATIVE E.U./dL — AB
pH, UA: 6 (ref 5.0–8.0)

## 2018-10-05 NOTE — Telephone Encounter (Signed)
Copied from [place] [phone]. Topic: Referral - Request for Referral >> Oct 05, 2018  4:10 PM Blase Mess A wrote: Has patient seen PCP for this complaint? Yes  *If NO, is insurance requiring patient see PCP for this issue before PCP can refer them? No Referral for which specialty: Orthopedic Specialist Preferred provider/office: Dr. Kyla Balzarine [place] [id number] [phone] Reason for referral: Right Knee pain

## 2018-10-06 ENCOUNTER — Encounter: Payer: Self-pay | Admitting: Family Medicine

## 2018-10-06 NOTE — Telephone Encounter (Signed)
Will check with Sheena Sarna do not seen in chart where she was seen for knee pain

## 2018-10-06 NOTE — Telephone Encounter (Signed)
Referral is placed.

## 2018-10-07 DIAGNOSIS — N390 Urinary tract infection, site not specified: Secondary | ICD-10-CM | POA: Diagnosis not present

## 2018-10-07 DIAGNOSIS — R399 Unspecified symptoms and signs involving the genitourinary system: Secondary | ICD-10-CM | POA: Diagnosis not present

## 2018-10-07 NOTE — Telephone Encounter (Signed)
Copied from [place] [phone]. Topic: Referral - Question >> Oct 07, 2018 10:57 AM Ahmed Prima L wrote: Reason for CRM: Patient states that [facility] can not see her until March 30th. She would like that to be cancelled and try to get her in through [facility] system. She would need a new referral placed. Thanks

## 2018-10-12 ENCOUNTER — Other Ambulatory Visit: Payer: Self-pay | Admitting: *Deleted

## 2018-10-12 MED ORDER — TAMOXIFEN CITRATE 20 MG PO TABS: 20.0000 mg | ORAL_TABLET | Freq: Every day | ORAL | 0 refills | Status: DC

## 2018-10-13 DIAGNOSIS — E0590 Thyrotoxicosis, unspecified without thyrotoxic crisis or storm: Secondary | ICD-10-CM | POA: Diagnosis not present

## 2018-10-13 DIAGNOSIS — F419 Anxiety disorder, unspecified: Secondary | ICD-10-CM | POA: Diagnosis not present

## 2018-10-13 DIAGNOSIS — F410 Panic disorder [episodic paroxysmal anxiety] without agoraphobia: Secondary | ICD-10-CM | POA: Diagnosis not present

## 2018-10-13 DIAGNOSIS — H9319 Tinnitus, unspecified ear: Secondary | ICD-10-CM | POA: Diagnosis not present

## 2018-10-13 DIAGNOSIS — G4700 Insomnia, unspecified: Secondary | ICD-10-CM | POA: Diagnosis not present

## 2018-10-16 DIAGNOSIS — H16002 Unspecified corneal ulcer, left eye: Secondary | ICD-10-CM | POA: Diagnosis not present

## 2018-10-19 DIAGNOSIS — M1711 Unilateral primary osteoarthritis, right knee: Secondary | ICD-10-CM | POA: Diagnosis not present

## 2018-10-19 DIAGNOSIS — M25561 Pain in right knee: Secondary | ICD-10-CM | POA: Diagnosis not present

## 2018-10-20 ENCOUNTER — Encounter: Payer: Self-pay | Admitting: Family Medicine

## 2018-10-20 ENCOUNTER — Ambulatory Visit: Payer: Medicaid Other | Admitting: Family Medicine

## 2018-10-20 VITALS — BP 110/72 | HR 85 | Temp 98.6°F | Resp 16 | Ht 60.0 in | Wt 220.4 lb

## 2018-10-20 DIAGNOSIS — H16002 Unspecified corneal ulcer, left eye: Secondary | ICD-10-CM

## 2018-10-20 DIAGNOSIS — L680 Hirsutism: Secondary | ICD-10-CM

## 2018-10-20 DIAGNOSIS — H9313 Tinnitus, bilateral: Secondary | ICD-10-CM

## 2018-10-20 DIAGNOSIS — M3500 Sicca syndrome, unspecified: Secondary | ICD-10-CM

## 2018-10-20 DIAGNOSIS — Z170 Estrogen receptor positive status [ER+]: Secondary | ICD-10-CM

## 2018-10-20 DIAGNOSIS — F15982 Other stimulant use, unspecified with stimulant-induced sleep disorder: Secondary | ICD-10-CM | POA: Insufficient documentation

## 2018-10-20 DIAGNOSIS — C50212 Malignant neoplasm of upper-inner quadrant of left female breast: Secondary | ICD-10-CM

## 2018-10-20 DIAGNOSIS — H16072 Perforated corneal ulcer, left eye: Secondary | ICD-10-CM

## 2018-10-20 DIAGNOSIS — F419 Anxiety disorder, unspecified: Secondary | ICD-10-CM | POA: Diagnosis not present

## 2018-10-20 DIAGNOSIS — E559 Vitamin D deficiency, unspecified: Secondary | ICD-10-CM

## 2018-10-20 DIAGNOSIS — M170 Bilateral primary osteoarthritis of knee: Secondary | ICD-10-CM

## 2018-10-20 DIAGNOSIS — Z947 Corneal transplant status: Secondary | ICD-10-CM | POA: Diagnosis not present

## 2018-10-20 DIAGNOSIS — E785 Hyperlipidemia, unspecified: Secondary | ICD-10-CM | POA: Diagnosis not present

## 2018-10-20 DIAGNOSIS — Z79810 Long term (current) use of selective estrogen receptor modulators (SERMs): Secondary | ICD-10-CM | POA: Diagnosis not present

## 2018-10-20 DIAGNOSIS — R319 Hematuria, unspecified: Secondary | ICD-10-CM

## 2018-10-20 DIAGNOSIS — Z6841 Body Mass Index (BMI) 40.0 and over, adult: Secondary | ICD-10-CM

## 2018-10-20 DIAGNOSIS — R7302 Impaired glucose tolerance (oral): Secondary | ICD-10-CM

## 2018-10-20 DIAGNOSIS — D75839 Thrombocytosis, unspecified: Secondary | ICD-10-CM | POA: Insufficient documentation

## 2018-10-20 DIAGNOSIS — D702 Other drug-induced agranulocytosis: Secondary | ICD-10-CM | POA: Insufficient documentation

## 2018-10-20 DIAGNOSIS — D473 Essential (hemorrhagic) thrombocythemia: Secondary | ICD-10-CM | POA: Insufficient documentation

## 2018-10-20 DIAGNOSIS — E0520 Thyrotoxicosis with toxic multinodular goiter without thyrotoxic crisis or storm: Secondary | ICD-10-CM | POA: Diagnosis not present

## 2018-10-20 DIAGNOSIS — R232 Flushing: Secondary | ICD-10-CM

## 2018-10-20 LAB — POCT URINALYSIS DIPSTICK
APPEARANCE: NORMAL
Bilirubin, UA: NEGATIVE
GLUCOSE UA: NEGATIVE
Ketones, UA: NEGATIVE
LEUKOCYTES UA: NEGATIVE
Nitrite, UA: NEGATIVE
Odor: NORMAL
Protein, UA: NEGATIVE
RBC UA: NEGATIVE
Spec Grav, UA: 1.01 (ref 1.010–1.025)
Urobilinogen, UA: 0.2 E.U./dL
pH, UA: 8 (ref 5.0–8.0)

## 2018-10-20 NOTE — Progress Notes (Signed)
Established Patient Office Visit  Subjective:  Patient ID: Sheena Martinez, female    DOB: 1960/12/18  Age: 58 y.o. MRN: [id number]  CC: No chief complaint on file.   HPI Sheena Martinez presents for follow up:  Flushing: Saw Elizabeth NP-C for facial flushing after cortisone injection on 07/24/2018.  She also had allergic reaction to Retuxan on 10/16/2018 and woke the next day with facial flushing that lasted most of the day.  It has since resolved. Advised to RTC if worsening.  Sjogren's: Seeing rheumatology on routine basis - next appt is late March 2020; unclear if this was the cause of her corneal perforation.  She is not on DMARD at this time, though it has been discussed with rheumatology.  When she came off of Prednisone, she started having tinnitus and joint pain that has been ongoing.  She will talk with her Rheumatologist in her upcoming appt about arthralgias.  Tinnitus: Saw radiation oncologist and told him and they told her to stop tomoxifen x2 weeks to see if the ringing stops - went back on because it didn't help.  Also tried off of Buspar and this didn't help. Has saw ENT and was told there was nothing she could do about it. No S/I, but does feel like it affects her mood at times.  Vitamin D Deficiency: Is due for DEXA scan - will call soon to schedule. Vitamin D was low at 24 - taking 2000IU x90 days, then will go back down to 1000IU daily (due to decrease in the next 1-2 weeks).  She is also taking vitamin K twice weekly.  Unchanged  Right Knee pain and Arthralgias: She noticed RIGHT knee pain flared back up after she stopped her prednisone. 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  RLE swelling: She notes  has had RLE edema for many years, even before cancer treatment.  She states the right ankle aches quite a bit.  She will elevate at night and apply icy hot and this helps temporarily, swelling goes down, but the pain and swelling return the next day.   Anxiety: She is seeing psychiatry with Sheena Martinez - Sheena Martinez and is doing well on current medication regimen.  She was referred for counseling but does not have the time right now. Unchanged/stable at this time.  Hematuria: We will recheck UA, and check on Urology referral.  She paused meloxicam at last visit, but restarted due to pain. Still having some urinary urgency and pressure.  Hot flashes: Have been going on for many months, she attributed them to the tamoxifen, still ongoing nightly. Her last period was before starting Tapazole in May 2019.  No drenching sweats.  Discussed possibly menopausal symptoms vs medication SE.  She states she is able to tolerate at this time. She has talked to Sheena Martinez about this in the past, but they were mild at that time. Unchanged  Breast Cancer - LEFT BREAST:Following up with Dr. Tasia Martinez and Dr. Donella Martinez, stopped  - has been of Tamoxifen as above. Recent mammogram in September 2019 with Dr. Bary Martinez was normal She had LEFT lumpectomy in October 2018, had radiation and chemo therapy.  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 She is using prednisone drops and brimonidine drops, and has had sutures removed/have come out on their own. Stable/Unchanged  Abnormal Thyroid Test: She reports abnormal thyroid testing in the past ; TSH on 10/15/17 was 0.01- she is seeing endocrinology now for management and taking Tapazole - they discussed radioactive medication for ablation, but they chose to wait until her cornea and ulcerative keratitis has healed and her infusions are completed for her cancer treatment.  She denies  abnormal fatigue; she does have hairloss (is s/p chemo), denies palpitations, chest pain, shortness of breath; endorses ongoing anxiety as above. Hot flashes as above. Unchanged.  Hirsutism: Has history elevated testosterone and PCOS in the past, used to receive care from endocrinology for this, however she lost follow up when she lost insurance several years ago.  She was referred to endocrinology for this and is seeing them routinely. Unchanged  Elevated LDL: ASCVD 2.4% risk; no need for statin at this time, lifestyle modifications discussed in detail. Will continue to monitor periodically. Stable today.  Obesity: Her weight is stable., She is joining a women's only gym for seniors with her friend, Sheena Martinez.  This gym also offers nutrition services which she will be using.  We discussed lifestyle modifications in detail. Unchanged from last visit.  Past Medical History:  Diagnosis Date  . Anxiety   . Arthritis   . 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 Colon cancer ([place])   . Corneal perforation of left eye 10/09/2017   Overview:  Added automatically from request for surgery [id number]  . GERD (gastroesophageal reflux disease)    OCC  . Goiter   . Hematuria 10/21/2018  . Hyperthyroidism   . Personal history of radiation therapy   . Sjogren's syndrome [facility])     Past Surgical History:  Procedure Laterality Date  . BREAST BIOPSY Left 05/12/2017   [place] bx/ INVASIVE MAMMARY CARCINOMA  . BREAST LUMPECTOMY Left 05/2017   invasive mammary carcinoma, neg margins  . BREAST LUMPECTOMY WITH SENTINEL LYMPH NODE BIOPSY Left 06/10/2017   Procedure: BREAST LUMPECTOMY WITH SENTINEL LYMPH NODE BX AND NEEDLE LOCALIZATION;  Surgeon: Robert Bellow, MD;  Location: [facility] ORS;  Service: General;  Laterality: Left;  . BREAST MAMMOSITE Left 06/24/2017   Procedure: MAMMOSITE BREAST;  Surgeon: Robert Bellow, MD;  Location: [facility] ORS;   Service: General;  Laterality: Left;  . CESAREAN SECTION  1995  . 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 COSMETIC SURGERY    . CYST EXCISION  2018   pilar cyst/ Dr Will Bonnet  . EYE SURGERY    . PORTACATH PLACEMENT Right 07/08/2017   Procedure: INSERTION PORT-A-CATH;  Surgeon: Robert Bellow, MD;  Location: [facility] ORS;  Service: General;  Laterality: Right;  . TONSILLECTOMY      Family History  Problem Relation Age of Onset  . Breast cancer Maternal Aunt 40       currently ~65  . Diabetes Mother   . Skin cancer Mother        currently 53; TAH/BSO (age?)  . Anemia Mother   . Liver disease Father        deceased / not much info about him / alcoholic  . Lung cancer Paternal Aunt        smoker / deceased 59s  . Stomach cancer Paternal Uncle 42  deceased / deceased 61s  . Ovarian cancer Maternal Grandmother 32       deceased 69s  . Stomach cancer Paternal Grandfather 54       deceased 21s  . Cancer Maternal Uncle        unk. type; deceased 66s  . Leukemia Cousin 66       deceased 10    Social History   Socioeconomic History  . Marital status: Married    Spouse name: Eulas Post  . Number of children: 3  . Years of education: Not on file  . Highest education level: GED or equivalent  Occupational History  . Occupation: unemployed  Social Needs  . Financial resource strain: Not on file  . Food insecurity:    Worry: Never true    Inability: Never true  . Transportation needs:    Medical: No    Non-medical: No  Tobacco Use  . Smoking status: Former Smoker    Years: 7.00    Types: Cigarettes    Last attempt to quit: 08/13/1979    Years since quitting: 39.2  . Smokeless tobacco: Never Used  . Tobacco comment: AGE [age]  Substance and Sexual Activity  . Alcohol use: No  . Drug use: No  . Sexual activity: Yes  Lifestyle  . Physical activity:    Days per week: 0 days    Minutes per session: 0 min  . Stress: Very much  Relationships  . Social connections:    Talks  on phone: More than three times a week    Gets together: More than three times a week    Attends religious service: More than 4 times per year    Active member of club or organization: Yes    Attends meetings of clubs or organizations: More than 4 times per year    Relationship status: Married  . Intimate partner violence:    Fear of current or ex partner: No    Emotionally abused: No    Physically abused: No    Forced sexual activity: No  Other Topics Concern  . Not on file  Social History Narrative  . Not on file    Outpatient Medications Prior to Visit  Medication Sig Dispense Refill  . acetaminophen (TYLENOL) 500 MG tablet Take 1,000 mg by mouth every 8 (eight) hours as needed for mild pain or headache.    . brimonidine (ALPHAGAN) 0.2 % ophthalmic solution INSTILL 1 DROP INTO LEFT EYE THREE TIMES DAILY  12  . Ca Carbonate-Mag Hydroxide (ROLAIDS PO) Take 1 tablet as needed by mouth (indigestion).     . carboxymethylcellulose 1 % ophthalmic solution Place 1 drop into the left eye 4 (four) times daily.    . cholecalciferol (VITAMIN D) 1000 units tablet Take 2,000 Units by mouth daily.    . cycloSPORINE (RESTASIS) 0.05 % ophthalmic emulsion INSTILL 1 DROP INTO EACH EYE TWICE DAILY    . ketoconazole (NIZORAL) 2 % shampoo Apply 1 application topically 2 (two) times a week.    Marland Kitchen LORazepam (ATIVAN) 0.5 MG tablet   0  . methimazole (TAPAZOLE) 10 MG tablet TAKE 1 TABLET BY MOUTH ONCE DAILY AT 6AM  2  . prednisoLONE acetate (PRED FORTE) 1 % ophthalmic suspension   1  . sertraline (ZOLOFT) 100 MG tablet Take 150 mg by mouth daily.  1  . tamoxifen (NOLVADEX) 20 MG tablet Take 1 tablet (20 mg total) by mouth daily. 30 tablet 0  . vitamin C (ASCORBIC ACID) 500  MG tablet Take 500 mg by mouth daily.    . vitamin k 100 MCG tablet Take 100 mcg by mouth 2 (two) times a week.    . cyclobenzaprine (FLEXERIL) 5 MG tablet TAKE 1 TABLET BY MOUTH THREE TIMES DAILY AS NEEDED FOR MUSCLE SPASM TAKE AT  BEDTIME IF IT CAUSES DROWSINESS FOR UP TO 7 DAYS    . meloxicam (MOBIC) 15 MG tablet Take 7.5 mg by mouth daily.    . Timolol Maleate 0.5 % (DAILY) SOLN Place 1 drop into the left eye 2 (two) times daily.    Marland Kitchen tiZANidine (ZANAFLEX) 2 MG tablet Take 1-2 tablets (2-4 mg total) by mouth every 8 (eight) hours as needed for muscle spasms. (Patient not taking: Reported on 10/20/2018) 30 tablet 0   No facility-administered medications prior to visit.     Allergies  Allergen Reactions  . Rituximab Anaphylaxis  . Benadryl [Diphenhydramine] Other (See Comments)    Patient felt like she was going to "crawl out of her skin"  . Diphenhydramine Hcl   . Clonazepam Anxiety  . Sulfamethoxazole-Trimethoprim Rash    Chest tightness. Bactrim    ROS Review of Systems  Ten systems reviewed and is negative except as mentioned in HPI   Objective:    Physical Exam  Constitutional: Patient appears well-developed and well-nourished. No distress.  HENT: Head: Normocephalic and atraumatic. Eyes: Conjunctivae and EOM are normal.  Neck: Normal range of motion. Neck supple. No JVD present. No thyromegaly present.  Cardiovascular: Normal rate, regular rhythm and normal heart sounds.  No murmur heard. No BLE edema. Pulmonary/Chest: Effort normal and breath sounds normal. No respiratory distress. Musculoskeletal: Normal range of motion, no joint effusions. No gross deformities Neurological: Pt is alert and oriented to person, place, and time. No cranial nerve deficit. Coordination, balance, strength, speech and gait are normal.  Skin: Skin is warm and dry. No rash noted. No erythema.  Psychiatric: Patient has a normal mood and affect. behavior is normal. Judgment and thought content normal.  BP 110/72 (BP Location: Left Arm, Patient Position: Sitting, Cuff Size: Normal)   Pulse 85   Temp 98.6 F (37 C) (Oral)   Resp 16   Ht 5' (1.524 m)   Wt 220 lb 6.4 oz (100 kg)   SpO2 96%   BMI 43.04 kg/m  Wt Readings  from Last 3 Encounters:  10/20/18 220 lb 6.4 oz (100 kg)  09/16/18 214 lb 1.6 oz (97.1 kg)  08/19/18 213 lb (96.6 kg)   Health Maintenance Due  Topic Date Due  . INFLUENZA VACCINE  03/12/2018    There are no preventive care reminders to display for this patient.  Lab Results  Component Value Date   TSH 0.02 (L) 02/03/2018   Lab Results  Component Value Date   WBC 5.1 08/19/2018   HGB 12.4 08/19/2018   HCT 39.0 08/19/2018   MCV 97.5 08/19/2018   PLT 295 08/19/2018   Lab Results  Component Value Date   NA 142 08/19/2018   K 3.9 08/19/2018   CO2 27 08/19/2018   GLUCOSE 112 (H) 08/19/2018   BUN 16 08/19/2018   CREATININE 0.76 08/19/2018   BILITOT 0.6 08/19/2018   ALKPHOS 71 08/19/2018   AST 14 (L) 08/19/2018   ALT 12 08/19/2018   PROT 6.2 (L) 08/19/2018   ALBUMIN 3.5 08/19/2018   CALCIUM 8.9 08/19/2018   ANIONGAP 6 08/19/2018   Lab Results  Component Value Date   CHOL 213 (H) 06/29/2018  Lab Results  Component Value Date   HDL 55 06/29/2018   Lab Results  Component Value Date   LDLCALC 129 (H) 06/29/2018   Lab Results  Component Value Date   TRIG 171 (H) 06/29/2018   Lab Results  Component Value Date   CHOLHDL 3.9 06/29/2018   Lab Results  Component Value Date   HGBA1C 4.7 10/15/2017      Assessment & Plan:   Problem List Items Addressed This Visit      Digestive   Sjogren's syndrome ([place])    Keep follow up with Endocrinology        Endocrine   Impaired glucose tolerance   Toxic multinodular goiter    Keep follow up with Endocrinology        Musculoskeletal and Integument   Hirsutism    Keep follow up with Endocrinology        Other   Malignant neoplasm of upper-inner quadrant of left breast in female, estrogen receptor positive ([place])    Keep follow up with Dr. Tasia Martinez      Peripheral ulcerative keratitis    Continue follow up with Specialty.      Anxiety    Continue with psychiatry      Hyperlipidemia   Vitamin D  deficiency    Completed 2000IU daily, then decrease to 1000IU daily      Morbid obesity with BMI of 40.0-44.9, adult ([place]) - Primary    Discussed importance of 150 minutes of physical activity weekly, eat two servings of fish weekly, eat one serving of tree nuts ( cashews, pistachios, pecans, almonds.Marland Kitchen) every other day, eat 6 servings of fruit/vegetables daily and drink plenty of water and avoid sweet beverages.        Bilateral primary osteoarthritis of knee    May resume low dose Meloxicam      Long-term current use of tamoxifen    Continue follow up with Dr. Tasia Martinez      History of cornea transplant   Facial flushing    Contact for evaluation if recurring      Tinnitus of both ears    Stable and unchanged      RESOLVED: Corneal perforation of left eye   RESOLVED: Hematuria    Resolved      Relevant Orders   POCT urinalysis dipstick (Completed)   Urine Culture      No orders of the defined types were placed in this encounter.   Follow-up: No follow-ups on file.    Sheena Hartshorn, FNP

## 2018-10-21 ENCOUNTER — Encounter: Payer: Self-pay | Admitting: Family Medicine

## 2018-10-21 DIAGNOSIS — R319 Hematuria, unspecified: Secondary | ICD-10-CM

## 2018-10-21 DIAGNOSIS — Z947 Corneal transplant status: Secondary | ICD-10-CM | POA: Diagnosis not present

## 2018-10-21 DIAGNOSIS — M3501 Sicca syndrome with keratoconjunctivitis: Secondary | ICD-10-CM | POA: Diagnosis not present

## 2018-10-21 DIAGNOSIS — H9313 Tinnitus, bilateral: Secondary | ICD-10-CM | POA: Insufficient documentation

## 2018-10-21 DIAGNOSIS — H40052 Ocular hypertension, left eye: Secondary | ICD-10-CM | POA: Diagnosis not present

## 2018-10-21 DIAGNOSIS — R232 Flushing: Secondary | ICD-10-CM | POA: Insufficient documentation

## 2018-10-21 DIAGNOSIS — H40003 Preglaucoma, unspecified, bilateral: Secondary | ICD-10-CM | POA: Diagnosis not present

## 2018-10-21 DIAGNOSIS — H11823 Conjunctivochalasis, bilateral: Secondary | ICD-10-CM | POA: Diagnosis not present

## 2018-10-21 HISTORY — DX: Hematuria, unspecified: R31.9

## 2018-10-21 NOTE — Assessment & Plan Note (Signed)
Completed 2000IU daily, then decrease to 1000IU daily

## 2018-10-21 NOTE — Assessment & Plan Note (Signed)
Discussed importance of 150 minutes of physical activity weekly, eat two servings of fish weekly, eat one serving of tree nuts ( cashews, pistachios, pecans, almonds..) every other day, eat 6 servings of fruit/vegetables daily and drink plenty of water and avoid sweet beverages. 

## 2018-10-21 NOTE — Assessment & Plan Note (Signed)
Contact for evaluation if recurring

## 2018-10-21 NOTE — Assessment & Plan Note (Signed)
May resume low dose Meloxicam

## 2018-10-21 NOTE — Assessment & Plan Note (Signed)
Stable and unchanged

## 2018-10-21 NOTE — Assessment & Plan Note (Signed)
Keep follow up with Dr. Tasia Catchings

## 2018-10-21 NOTE — Assessment & Plan Note (Signed)
Keep follow up with Endocrinology

## 2018-10-21 NOTE — Assessment & Plan Note (Signed)
Resolved

## 2018-10-21 NOTE — Assessment & Plan Note (Signed)
Continue follow up with Dr. Tasia Catchings

## 2018-10-21 NOTE — Assessment & Plan Note (Addendum)
Continue follow up with Specialty.

## 2018-10-21 NOTE — Assessment & Plan Note (Signed)
Continue with psychiatry

## 2018-10-22 LAB — URINE CULTURE
MICRO NUMBER: 301067
SPECIMEN QUALITY:: ADEQUATE

## 2018-10-23 ENCOUNTER — Emergency Department: Payer: Medicaid Other

## 2018-10-23 ENCOUNTER — Encounter: Payer: Self-pay | Admitting: Emergency Medicine

## 2018-10-23 ENCOUNTER — Other Ambulatory Visit: Payer: Self-pay

## 2018-10-23 ENCOUNTER — Emergency Department
Admission: EM | Admit: 2018-10-23 | Discharge: 2018-10-24 | Disposition: A | Discharge status: Home / Self Care | Payer: Medicaid Other | Attending: Emergency Medicine | Admitting: Emergency Medicine

## 2018-10-23 DIAGNOSIS — Z79899 Other long term (current) drug therapy: Secondary | ICD-10-CM | POA: Insufficient documentation

## 2018-10-23 DIAGNOSIS — N200 Calculus of kidney: Secondary | ICD-10-CM | POA: Diagnosis not present

## 2018-10-23 DIAGNOSIS — N201 Calculus of ureter: Secondary | ICD-10-CM | POA: Diagnosis not present

## 2018-10-23 DIAGNOSIS — R109 Unspecified abdominal pain: Secondary | ICD-10-CM | POA: Diagnosis present

## 2018-10-23 DIAGNOSIS — Z87891 Personal history of nicotine dependence: Secondary | ICD-10-CM | POA: Insufficient documentation

## 2018-10-23 DIAGNOSIS — Z853 Personal history of malignant neoplasm of breast: Secondary | ICD-10-CM | POA: Insufficient documentation

## 2018-10-23 DIAGNOSIS — Z85038 Personal history of other malignant neoplasm of large intestine: Secondary | ICD-10-CM | POA: Diagnosis not present

## 2018-10-23 LAB — URINALYSIS, COMPLETE (UACMP) WITH MICROSCOPIC
BILIRUBIN URINE: NEGATIVE
Glucose, UA: NEGATIVE mg/dL
Ketones, ur: NEGATIVE mg/dL
LEUKOCYTE UA: NEGATIVE
Nitrite: NEGATIVE
Protein, ur: NEGATIVE mg/dL
RBC / HPF: >50 RBC/hpf — ABNORMAL HIGH (ref 0–5)
SPECIFIC GRAVITY, URINE: 1.01 (ref 1.005–1.030)
pH: 7 (ref 5.0–8.0)

## 2018-10-23 LAB — CBC WITH DIFFERENTIAL/PLATELET
Abs Immature Granulocytes: 0.02 10*3/uL (ref 0.00–0.07)
BASOS PCT: 0 %
Basophils Absolute: 0 10*3/uL (ref 0.0–0.1)
EOS ABS: 0.1 10*3/uL (ref 0.0–0.5)
EOS PCT: 2 %
HCT: 39.9 % (ref 36.0–46.0)
Hemoglobin: 13.4 g/dL (ref 12.0–15.0)
Immature Granulocytes: 0 %
Lymphocytes Relative: 33 %
Lymphs Abs: 2.2 10*3/uL (ref 0.7–4.0)
MCH: 31.4 pg (ref 26.0–34.0)
MCHC: 33.6 g/dL (ref 30.0–36.0)
MCV: 93.4 fL (ref 80.0–100.0)
Monocytes Absolute: 0.7 10*3/uL (ref 0.1–1.0)
Monocytes Relative: 10 %
NEUTROS PCT: 55 %
NRBC: 0 % (ref 0.0–0.2)
Neutro Abs: 3.7 10*3/uL (ref 1.7–7.7)
Platelets: 301 10*3/uL (ref 150–400)
RBC: 4.27 MIL/uL (ref 3.87–5.11)
RDW: 13.3 % (ref 11.5–15.5)
WBC: 6.8 10*3/uL (ref 4.0–10.5)

## 2018-10-23 LAB — COMPREHENSIVE METABOLIC PANEL
ALT: 16 U/L (ref 0–44)
AST: 19 U/L (ref 15–41)
Albumin: 3.9 g/dL (ref 3.5–5.0)
Alkaline Phosphatase: 69 U/L (ref 38–126)
Anion gap: 9 (ref 5–15)
BUN: 14 mg/dL (ref 6–20)
CO2: 23 mmol/L (ref 22–32)
CREATININE: 0.78 mg/dL (ref 0.44–1.00)
Calcium: 9 mg/dL (ref 8.9–10.3)
Chloride: 106 mmol/L (ref 98–111)
GFR calc Af Amer: >60 mL/min (ref >60)
GFR calc non Af Amer: >60 mL/min (ref >60)
Glucose, Bld: 102 mg/dL — ABNORMAL HIGH (ref 70–99)
Potassium: 3.6 mmol/L (ref 3.5–5.1)
SODIUM: 138 mmol/L (ref 135–145)
Total Bilirubin: 0.7 mg/dL (ref 0.3–1.2)
Total Protein: 6.9 g/dL (ref 6.5–8.1)

## 2018-10-23 NOTE — ED Triage Notes (Signed)
PT arrives with bilateral flank pain that started "a month prior." Pt states she was treated for UTI and referred to urologist. PT states inability to receive follow up appt thus far. Pt arrives with concerns that her pain is still present.

## 2018-10-23 NOTE — ED Notes (Signed)
Assessment: pt states onset of right sided flank pain today that radiates "down there". Pt states "it feels like a urinary catheter being yanked out". Pt appears in no acute distress. Pt states pain is mostly radiating to pelvic area.

## 2018-10-23 NOTE — ED Notes (Signed)
Pt has port and request blood be done when her port is accessed.

## 2018-10-24 MED ORDER — KETOROLAC TROMETHAMINE 30 MG/ML IJ SOLN
30.0000 mg | Freq: Once | INTRAMUSCULAR | Status: AC
  Administered 2018-10-24: 30 mg via INTRAVENOUS
  Filled 2018-10-24: qty 1

## 2018-10-24 MED ORDER — SODIUM CHLORIDE 0.9 % IV BOLUS
1000.0000 mL | Freq: Once | INTRAVENOUS | Status: AC
  Administered 2018-10-24: 1000 mL via INTRAVENOUS

## 2018-10-24 MED ORDER — ONDANSETRON HCL 4 MG PO TABS: 4.0000 mg | ORAL_TABLET | Freq: Three times a day (TID) | ORAL | 0 refills | Status: DC | PRN

## 2018-10-24 MED ORDER — KETOROLAC TROMETHAMINE 10 MG PO TABS: 10.0000 mg | ORAL_TABLET | Freq: Three times a day (TID) | ORAL | 0 refills | Status: DC | PRN

## 2018-10-24 NOTE — ED Provider Notes (Signed)
[facility] Emergency Department Provider Note    ____________________________________________   I have reviewed the triage vital signs and the nursing notes.   HISTORY  Chief Complaint Flank Pain   History limited by: Not Limited   HPI Sheena Martinez is a 58 y.o. female who presents to the emergency department today because of concern for left groin pain. The patient states that she has been working with her primary care because of bilateral flank pain which has been ongoing for roughly 1 month. During this time she has had intermittent episodes of bloody urination. Today however the pain changed. It became more severe and was located more in the left groin and genital area. The patient describes it as feeling like a catheter was being placed.   Per medical record review patient has a history of hematuria.   Past Medical History:  Diagnosis Date  . Anxiety   . Arthritis   . 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 Colon cancer ([place])   . Corneal perforation of left eye 10/09/2017   Overview:  Added automatically from request for surgery [id number]  . GERD (gastroesophageal reflux disease)    OCC  . Goiter   . Hematuria 10/21/2018  . Hyperthyroidism   . Personal history of radiation therapy   . Sjogren's syndrome [facility])     Patient Active Problem List   Diagnosis Date Noted  . Facial flushing 10/21/2018  . Tinnitus of both ears 10/21/2018  . Drug-induced neutropenia ([place]) 10/20/2018  . 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 Thrombocytosis ([place]) 10/20/2018  . 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 Irregular menstrual cycle 03/02/2018  . Screening for cervical cancer 03/02/2018  . Long-term current use of tamoxifen 02/17/2018  . 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 Anxiety 10/03/2017  . Bilateral primary osteoarthritis of  knee 10/03/2017  . 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 Malignant neoplasm of upper-inner quadrant of left breast in female, estrogen receptor positive ([place]) 05/22/2017  . Hirsutism 11/24/2015  . Hyperlipidemia 02/01/2014  . Impaired glucose tolerance 02/01/2014  . Vitamin D deficiency 02/01/2014  . Family history of diabetes mellitus 01/31/2014  . Morbid obesity with BMI of 40.0-44.9, adult ([place]) 01/31/2014  . Sjogren's syndrome ([place]) 01/17/2014    Past Surgical History:  Procedure Laterality Date  . BREAST BIOPSY Left 05/12/2017   [place] bx/ INVASIVE MAMMARY CARCINOMA  . BREAST LUMPECTOMY Left 05/2017   invasive mammary carcinoma, neg margins  . BREAST LUMPECTOMY WITH SENTINEL LYMPH NODE BIOPSY Left 06/10/2017   Procedure: BREAST LUMPECTOMY WITH SENTINEL LYMPH NODE BX AND NEEDLE LOCALIZATION;  Surgeon: Robert Bellow, MD;  Location: [facility] ORS;  Service: General;  Laterality: Left;  . BREAST MAMMOSITE Left 06/24/2017   Procedure: MAMMOSITE BREAST;  Surgeon: Robert Bellow, MD;  Location: [facility] ORS;  Service: General;  Laterality: Left;  . CESAREAN SECTION  1995  . 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 COSMETIC SURGERY    . CYST EXCISION  2018   pilar cyst/ Dr Will Bonnet  . EYE SURGERY    . PORTACATH PLACEMENT Right 07/08/2017   Procedure: INSERTION PORT-A-CATH;  Surgeon: Robert Bellow, MD;  Location: [facility] ORS;  Service: General;  Laterality: Right;  . TONSILLECTOMY      Prior to Admission medications   Medication Sig Start Date End Date Taking? Authorizing Provider  acetaminophen (TYLENOL) 500 MG  tablet Take 1,000 mg by mouth every 8 (eight) hours as needed for mild pain or headache.    [provider]  brimonidine (ALPHAGAN) 0.2 % ophthalmic solution INSTILL 1 DROP INTO LEFT EYE THREE TIMES DAILY 06/25/18   [provider]  Ca Carbonate-Mag Hydroxide (ROLAIDS PO) Take 1 tablet as needed by mouth (indigestion).     [provider]   carboxymethylcellulose 1 % ophthalmic solution Place 1 drop into the left eye 4 (four) times daily.    [provider]  cholecalciferol (VITAMIN D) 1000 units tablet Take 2,000 Units by mouth daily.    [provider]  cycloSPORINE (RESTASIS) 0.05 % ophthalmic emulsion INSTILL 1 DROP INTO EACH EYE TWICE DAILY 06/04/18   [provider]  ketoconazole (NIZORAL) 2 % shampoo Apply 1 application topically 2 (two) times a week.    [provider]  LORazepam (ATIVAN) 0.5 MG tablet  11/14/17   [provider]  meloxicam (MOBIC) 15 MG tablet Take 7.5 mg by mouth daily.    [provider]  methimazole (TAPAZOLE) 10 MG tablet TAKE 1 TABLET BY MOUTH ONCE DAILY AT 6AM 01/04/18   [provider]  prednisoLONE acetate (PRED FORTE) 1 % ophthalmic suspension  11/07/17   [provider]  sertraline (ZOLOFT) 100 MG tablet Take 150 mg by mouth daily. 01/07/18   [provider]  tamoxifen (NOLVADEX) 20 MG tablet Take 1 tablet (20 mg total) by mouth daily. 10/12/18   Earlie Server, MD  Timolol Maleate 0.5 % (DAILY) SOLN Place 1 drop into the left eye 2 (two) times daily.    [provider]  vitamin C (ASCORBIC ACID) 500 MG tablet Take 500 mg by mouth daily.    [provider]  vitamin k 100 MCG tablet Take 100 mcg by mouth 2 (two) times a week.    Hubbard Hartshorn, FNP    Allergies Rituximab; Benadryl [diphenhydramine]; Diphenhydramine hcl; Clonazepam; and Sulfamethoxazole-trimethoprim  Family History  Problem Relation Age of Onset  . Breast cancer Maternal Aunt 35       currently ~65  . Diabetes Mother   . Skin cancer Mother        currently 23; TAH/BSO (age?)  . Anemia Mother   . Liver disease Father        deceased / not much info about him / alcoholic  . Lung cancer Paternal Aunt        smoker / deceased 15s  . Stomach cancer Paternal Uncle 76       deceased / deceased 56s  . Ovarian cancer Maternal Grandmother 54        deceased 83s  . Stomach cancer Paternal Grandfather 63       deceased 60s  . Cancer Maternal Uncle        unk. type; deceased 51s  . Leukemia Cousin 32       deceased 59    Social History Social History   Tobacco Use  . Smoking status: Former Smoker    Years: 7.00    Types: Cigarettes    Last attempt to quit: 08/13/1979    Years since quitting: 39.2  . Smokeless tobacco: Never Used  . Tobacco comment: AGE [age]  Substance Use Topics  . Alcohol use: No  . Drug use: No    Review of Systems Constitutional: No fever/chills Eyes: No visual changes. ENT: No sore throat. Cardiovascular: Denies chest pain. Respiratory: Denies shortness of breath. Gastrointestinal: Positive for left  groin pain.  Genitourinary: Positive for hematuria.  Musculoskeletal: Negative for back pain. Skin: Negative for rash. Neurological: Negative for headaches, focal weakness or numbness.  ____________________________________________   PHYSICAL EXAM:  VITAL SIGNS: ED Triage Vitals  Enc Vitals Group     BP 10/23/18 1731 (!) 145/71     Pulse Rate 10/23/18 1731 77     Resp 10/23/18 1731 18     Temp 10/23/18 1731 98.9 F (37.2 C)     Temp Source 10/23/18 1731 Oral     SpO2 10/23/18 1731 97 %     Weight 10/23/18 1731 220 lb (99.8 kg)     Height 10/23/18 1731 5' (1.524 m)     Head Circumference --      Peak Flow --      Pain Score 10/23/18 1737 10   Constitutional: Alert and oriented.  Eyes: Conjunctivae are normal.  ENT      Head: Normocephalic and atraumatic.      Nose: No congestion/rhinnorhea.      Mouth/Throat: Mucous membranes are moist.      Neck: No stridor. Hematological/Lymphatic/Immunilogical: No cervical lymphadenopathy. Cardiovascular: Normal rate, regular rhythm.  No murmurs, rubs, or gallops.  Respiratory: Normal respiratory effort without tachypnea nor retractions. Breath sounds are clear and equal bilaterally. No wheezes/rales/rhonchi. Gastrointestinal: Soft and non  tender. No rebound. No guarding.  Genitourinary: Deferred Musculoskeletal: Normal range of motion in all extremities. No lower extremity edema. Neurologic:  Normal speech and language. No gross focal neurologic deficits are appreciated.  Skin:  Skin is warm, dry and intact. No rash noted. Psychiatric: Mood and affect are normal. Speech and behavior are normal. Patient exhibits appropriate insight and judgment.  ____________________________________________    LABS (pertinent positives/negatives)  CBC wbc 6.8, hgb 13.4, plt 301 CMP wnl except glu 102 UA clear, large hgb dipstick, >50 rbc, rare bacteria  ____________________________________________   EKG  None  ____________________________________________    RADIOLOGY  CT renal Left 5 mm UVJ stone   ____________________________________________   PROCEDURES  Procedures  ____________________________________________   INITIAL IMPRESSION / ASSESSMENT AND PLAN / ED COURSE  Pertinent labs & imaging results that were available during my care of the patient were reviewed by me and considered in my medical decision making (see chart for details).   Patient presented to the emergency department today because of concerns for left flank/groin pain as well as some blood in her urine.  Urine did have a significant amount of blood and CT renal which was ordered from triage consistent with a left 5 mm UVJ stone.  I discussed this finding with patient.  Discussed expectant management.  Patient did feel better after IV fluids and Toradol here.  She was to see urology on the 30th.  Discussed with patient she should call to see if they can move up the appointment if the pain continues.  Did discuss infection return precautions.  ____________________________________________   FINAL CLINICAL IMPRESSION(S) / ED DIAGNOSES  Final diagnoses:  Kidney stone     Note: This dictation was prepared with Dragon dictation. Any transcriptional  errors that result from this process are unintentional     Nance Pear, MD 10/24/18 [phone]

## 2018-10-24 NOTE — ED Notes (Signed)
Ice chips and icee provided. Call bell at right side, bed adjusted for comfort.

## 2018-10-24 NOTE — Discharge Instructions (Addendum)
Please seek medical attention for any high fevers, chest pain, shortness of breath, change in behavior, persistent vomiting, bloody stool or any other new or concerning symptoms.  

## 2018-10-24 NOTE — ED Notes (Signed)
Discussed with option of iv start of port access. Offered another RN to access pt's port due to this RN's comfort level with pt's port location and access vs. Iv start. Pt opts for iv start.

## 2018-10-26 DIAGNOSIS — M545 Low back pain: Secondary | ICD-10-CM | POA: Diagnosis not present

## 2018-10-29 DIAGNOSIS — S0500XA Injury of conjunctiva and corneal abrasion without foreign body, unspecified eye, initial encounter: Secondary | ICD-10-CM | POA: Diagnosis not present

## 2018-11-03 DIAGNOSIS — R8271 Bacteriuria: Secondary | ICD-10-CM | POA: Diagnosis not present

## 2018-11-06 DIAGNOSIS — C50212 Malignant neoplasm of upper-inner quadrant of left female breast: Secondary | ICD-10-CM | POA: Diagnosis not present

## 2018-11-06 DIAGNOSIS — F419 Anxiety disorder, unspecified: Secondary | ICD-10-CM | POA: Diagnosis not present

## 2018-11-06 DIAGNOSIS — N200 Calculus of kidney: Secondary | ICD-10-CM | POA: Diagnosis not present

## 2018-11-06 DIAGNOSIS — E785 Hyperlipidemia, unspecified: Secondary | ICD-10-CM | POA: Diagnosis not present

## 2018-11-09 ENCOUNTER — Ambulatory Visit: Payer: Medicaid Other | Admitting: Urology

## 2018-11-20 DIAGNOSIS — H16002 Unspecified corneal ulcer, left eye: Secondary | ICD-10-CM | POA: Diagnosis not present

## 2018-11-20 DIAGNOSIS — H11821 Conjunctivochalasis, right eye: Secondary | ICD-10-CM | POA: Diagnosis not present

## 2018-11-20 DIAGNOSIS — H04211 Epiphora due to excess lacrimation, right lacrimal gland: Secondary | ICD-10-CM | POA: Diagnosis not present

## 2018-11-27 DIAGNOSIS — H40052 Ocular hypertension, left eye: Secondary | ICD-10-CM | POA: Diagnosis not present

## 2018-11-27 DIAGNOSIS — H40001 Preglaucoma, unspecified, right eye: Secondary | ICD-10-CM | POA: Diagnosis not present

## 2018-11-27 DIAGNOSIS — H18893 Other specified disorders of cornea, bilateral: Secondary | ICD-10-CM | POA: Diagnosis not present

## 2018-11-27 DIAGNOSIS — H11821 Conjunctivochalasis, right eye: Secondary | ICD-10-CM | POA: Diagnosis not present

## 2018-11-30 ENCOUNTER — Encounter: Payer: Self-pay | Admitting: Family Medicine

## 2018-11-30 ENCOUNTER — Ambulatory Visit (INDEPENDENT_AMBULATORY_CARE_PROVIDER_SITE_OTHER): Payer: Medicaid Other | Admitting: Family Medicine

## 2018-11-30 ENCOUNTER — Other Ambulatory Visit: Payer: Self-pay

## 2018-11-30 VITALS — Temp 98.4°F | Ht 60.0 in | Wt 225.6 lb

## 2018-11-30 DIAGNOSIS — N3000 Acute cystitis without hematuria: Secondary | ICD-10-CM | POA: Diagnosis not present

## 2018-11-30 DIAGNOSIS — M545 Low back pain, unspecified: Secondary | ICD-10-CM

## 2018-11-30 DIAGNOSIS — R1030 Lower abdominal pain, unspecified: Secondary | ICD-10-CM | POA: Diagnosis not present

## 2018-11-30 DIAGNOSIS — R519 Headache, unspecified: Secondary | ICD-10-CM

## 2018-11-30 DIAGNOSIS — R51 Headache: Secondary | ICD-10-CM | POA: Diagnosis not present

## 2018-11-30 DIAGNOSIS — G8929 Other chronic pain: Secondary | ICD-10-CM | POA: Diagnosis not present

## 2018-11-30 DIAGNOSIS — Z87442 Personal history of urinary calculi: Secondary | ICD-10-CM | POA: Diagnosis not present

## 2018-11-30 LAB — POCT URINALYSIS DIPSTICK
Bilirubin, UA: NEGATIVE
Blood, UA: NEGATIVE
Glucose, UA: NEGATIVE
Ketones, UA: NEGATIVE
Nitrite, UA: NEGATIVE
Protein, UA: NEGATIVE
Spec Grav, UA: 1.01 (ref 1.010–1.025)
Urobilinogen, UA: 0.2 E.U./dL
pH, UA: 5 (ref 5.0–8.0)

## 2018-11-30 MED ORDER — KETOCONAZOLE 2 % EX SHAM: 1.0000 "application " | MEDICATED_SHAMPOO | CUTANEOUS | 3 refills | Status: DC

## 2018-11-30 MED ORDER — TIZANIDINE HCL 2 MG PO CAPS: 2.0000 mg | ORAL_CAPSULE | Freq: Three times a day (TID) | ORAL | 1 refills | Status: DC

## 2018-11-30 NOTE — Progress Notes (Signed)
Name: Sheena Martinez   MRN: [id number]    DOB: 04/08/1961   Date:11/30/2018       Progress Note  Subjective  Chief Complaint  Chief Complaint  Patient presents with  . Urinary Tract Infection    sx: lower back pain, pressure in private area - started about 3 months ago (on/off) has recently passed a kidney stone.     I connected with  [name]  on 11/30/18 at 12:40 PM EDT by a video enabled telemedicine application and verified that I am speaking with the correct person using two identifiers.  I discussed the limitations of evaluation and management by telemedicine and the availability of in person appointments. The patient expressed understanding and agreed to proceed. Staff also discussed with the patient that there may be a patient responsible charge related to this service. Patient Location: Home Provider Location: Home Additional Individuals present: None  HPI  Pt had recent kidney stone (2/20-3/20), was still having pain, so she went to her urologist, was placed on 3rd round of abx, and her symptoms are returning.  She has been seeing [facility] in [place]. She has been experiencing exacerbation in symptoms for about 7 days - lower abdominal pain, low back pain; no dysuria, no frank hematuria, nausea, or vomiting, no flank pain.  In February she was on Macrobid, then Cipro, then a 3rd that she is unsure of the name(rx'd by [facility], and I am unable to view records).  Taking tizanidine PRN - did discuss potential risk of tizanidine and ativan and she verbalizes understanding and will space these medications.  She did see Dr. Edwina Barth with internal medicine at [facility], but states is not switching to him at this time.  She has urology appt Friday.  We will check UA and culture today.  Patient Active Problem List   Diagnosis Date Noted  . Facial flushing 10/21/2018  . Tinnitus of both ears 10/21/2018  . Drug-induced neutropenia ([place]) 10/20/2018  . Other stimulant use,  unspecified with stimulant-induced sleep disorder ([place]) 10/20/2018  . Thrombocytosis ([place]) 10/20/2018  . 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 Irregular menstrual cycle 03/02/2018  . Screening for cervical cancer 03/02/2018  . Long-term current use of tamoxifen 02/17/2018  . 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 Anxiety 10/03/2017  . Bilateral primary osteoarthritis of knee 10/03/2017  . 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 Malignant neoplasm of upper-inner quadrant of left breast in female, estrogen receptor positive ([place]) 05/22/2017  . Hirsutism 11/24/2015  . Hyperlipidemia 02/01/2014  . Impaired glucose tolerance 02/01/2014  . Vitamin D deficiency 02/01/2014  . Family history of diabetes mellitus 01/31/2014  . Morbid obesity with BMI of 40.0-44.9, adult ([place]) 01/31/2014  . Sjogren's syndrome ([place]) 01/17/2014    Social History   Tobacco Use  . Smoking status: Former Smoker    Years: 7.00    Types: Cigarettes    Last attempt to quit: 08/13/1979    Years since quitting: 39.3  . Smokeless tobacco: Never Used  . Tobacco comment: AGE [age]  Substance Use Topics  . Alcohol use: No     Current Outpatient Medications:  .  acetaminophen (TYLENOL) 500 MG tablet, Take 1,000 mg by mouth every 8 (eight) hours as needed for mild pain or headache., Disp: , Rfl:  .  brimonidine (ALPHAGAN) 0.2 % ophthalmic solution, INSTILL 1 DROP INTO LEFT EYE THREE TIMES DAILY, Disp: , Rfl: 12 .  Ca Carbonate-Mag Hydroxide (ROLAIDS PO), Take 1 tablet as needed  by mouth (indigestion). , Disp: , Rfl:  .  carboxymethylcellulose 1 % ophthalmic solution, Place 1 drop into the left eye 4 (four) times daily., Disp: , Rfl:  .  cholecalciferol (VITAMIN D) 1000 units tablet, Take 2,000 Units by mouth daily., Disp: , Rfl:  .  ketoconazole (NIZORAL) 2 % shampoo, Apply 1 application topically 2 (two) times a week., Disp: , Rfl:  .  ketorolac (TORADOL) 10 MG tablet, Take 1 tablet (10 mg total) by mouth  every 8 (eight) hours as needed for severe pain., Disp: 20 tablet, Rfl: 0 .  LORazepam (ATIVAN) 0.5 MG tablet, , Disp: , Rfl: 0 .  meloxicam (MOBIC) 15 MG tablet, Take 7.5 mg by mouth daily., Disp: , Rfl:  .  methimazole (TAPAZOLE) 10 MG tablet, TAKE 1 TABLET BY MOUTH ONCE DAILY AT 6AM, Disp: , Rfl: 2 .  prednisoLONE acetate (PRED FORTE) 1 % ophthalmic suspension, , Disp: , Rfl: 1 .  sertraline (ZOLOFT) 100 MG tablet, Take 150 mg by mouth daily., Disp: , Rfl: 1 .  tamoxifen (NOLVADEX) 20 MG tablet, Take 1 tablet (20 mg total) by mouth daily., Disp: 30 tablet, Rfl: 0 .  Timolol Maleate 0.5 % (DAILY) SOLN, Place 1 drop into the left eye 2 (two) times daily., Disp: , Rfl:  .  vitamin k 100 MCG tablet, Take 100 mcg by mouth 2 (two) times a week., Disp: , Rfl:  .  cycloSPORINE (RESTASIS) 0.05 % ophthalmic emulsion, INSTILL 1 DROP INTO EACH EYE TWICE DAILY, Disp: , Rfl:  .  ondansetron (ZOFRAN) 4 MG tablet, Take 1 tablet (4 mg total) by mouth every 8 (eight) hours as needed. (Patient not taking: Reported on 11/30/2018), Disp: 20 tablet, Rfl: 0 .  vitamin C (ASCORBIC ACID) 500 MG tablet, Take 500 mg by mouth daily., Disp: , Rfl:   Allergies  Allergen Reactions  . Rituximab Anaphylaxis  . Benadryl [Diphenhydramine] Other (See Comments)    Patient felt like she was going to "crawl out of her skin"  . Diphenhydramine Hcl   . Clonazepam Anxiety  . Sulfamethoxazole-Trimethoprim Rash    Chest tightness. Bactrim    I personally reviewed active problem list, medication list, allergies, notes from last encounter, lab results with the patient/caregiver today.  ROS  Ten systems reviewed and is negative except as mentioned in HPI.  Objective  Virtual encounter, vitals not obtained.  Body mass index is 44.06 kg/m.  Nursing Note and Vital Signs reviewed.  Physical Exam  Constitutional: Patient appears well-developed and well-nourished. No distress.  HENT: Head: Normocephalic and atraumatic.   Neck: Normal range of motion. Pulmonary/Chest: Effort normal. No respiratory distress. Speaking in complete sentences Neurological: Pt is alert and oriented to person, place, and time. Coordination, speech is normal.  Psychiatric: Patient has a normal mood and affect. behavior is normal. Judgment and thought content normal.  No results found for this or any previous visit (from the past 72 hour(s)).  Assessment & Plan  1. Lower Abdominal Pain - POCT urinalysis dipstick - Urine Culture  2. Chronic bilateral low back pain without sciatica - tizanidine (ZANAFLEX) 2 MG capsule; Take 1 capsule (2 mg total) by mouth 3 (three) times daily.  Dispense: 30 capsule; Refill: 1  3. Scalp tenderness - ketoconazole (NIZORAL) 2 % shampoo; Apply 1 application topically 2 (two) times a week.  Dispense: 120 mL; Refill: 3  -Red flags and when to present for emergency care or RTC including fever >101.16F, chest pain, shortness of breath, new/worsening/un-resolving  symptoms, severe pain, N/V reviewed with patient at time of visit. Follow up and care instructions discussed and provided in AVS. - I discussed the assessment and treatment plan with the patient. The patient was provided an opportunity to ask questions and all were answered. The patient agreed with the plan and demonstrated an understanding of the instructions.  I provided 19 minutes of non-face-to-face time during this encounter.  Hubbard Hartshorn, FNP

## 2018-11-30 NOTE — Addendum Note (Signed)
Addended by: Inda Coke on: 11/30/2018 02:44 PM   Modules accepted: Orders

## 2018-12-01 LAB — URINE CULTURE
MICRO NUMBER:: 408196
SPECIMEN QUALITY:: ADEQUATE

## 2018-12-02 DIAGNOSIS — N393 Stress incontinence (female) (male): Secondary | ICD-10-CM | POA: Diagnosis not present

## 2018-12-02 DIAGNOSIS — M545 Low back pain: Secondary | ICD-10-CM | POA: Diagnosis not present

## 2018-12-02 DIAGNOSIS — R102 Pelvic and perineal pain: Secondary | ICD-10-CM | POA: Diagnosis not present

## 2018-12-02 DIAGNOSIS — R8271 Bacteriuria: Secondary | ICD-10-CM | POA: Diagnosis not present

## 2018-12-02 DIAGNOSIS — R3915 Urgency of urination: Secondary | ICD-10-CM | POA: Diagnosis not present

## 2018-12-10 ENCOUNTER — Telehealth: Payer: Self-pay | Admitting: *Deleted

## 2018-12-10 ENCOUNTER — Ambulatory Visit: Payer: Medicaid Other | Attending: Urology | Admitting: Physical Therapy

## 2018-12-10 ENCOUNTER — Other Ambulatory Visit: Payer: Self-pay

## 2018-12-10 ENCOUNTER — Encounter: Payer: Self-pay | Admitting: Physical Therapy

## 2018-12-10 ENCOUNTER — Other Ambulatory Visit: Payer: Self-pay | Admitting: *Deleted

## 2018-12-10 DIAGNOSIS — M6281 Muscle weakness (generalized): Secondary | ICD-10-CM | POA: Diagnosis not present

## 2018-12-10 DIAGNOSIS — M545 Low back pain, unspecified: Secondary | ICD-10-CM

## 2018-12-10 DIAGNOSIS — R1030 Lower abdominal pain, unspecified: Secondary | ICD-10-CM

## 2018-12-10 DIAGNOSIS — N393 Stress incontinence (female) (male): Secondary | ICD-10-CM

## 2018-12-10 MED ORDER — TAMOXIFEN CITRATE 20 MG PO TABS: 20.0000 mg | ORAL_TABLET | Freq: Every day | ORAL | 1 refills | Status: DC

## 2018-12-10 NOTE — Telephone Encounter (Signed)
Patient called reporting she has lost her new prescription of Tamoxifen and wants to know what can be done about it. Per pharmacy and our records this was last sent 10/12/18. Will send new prescription in.

## 2018-12-10 NOTE — Telephone Encounter (Signed)
Ok. Thanks!

## 2018-12-10 NOTE — Therapy (Signed)
[facility] [address], [place], [place], [address] Phone: [phone]   Fax:  [phone]  Physical Therapy Evaluation  Patient Details  Name: Sheena Martinez MRN: [id number] Date of Birth: 02/09/1961 Referring Provider (PT): Dr. Jed Limerick, NP   Encounter Date: 12/10/2018  PT End of Session - 12/10/18 1355    Visit Number  1    Date for PT Re-Evaluation  04/11/19    Authorization Type  Medicaid    PT Start Time  1300    PT Stop Time  1346    PT Time Calculation (min)  46 min    Activity Tolerance  Patient tolerated treatment well;No increased pain    Behavior During Therapy  WFL for tasks assessed/performed       Past Medical History:  Diagnosis Date  . Anxiety   . Arthritis   . 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 Corneal perforation of left eye 10/09/2017   Overview:  Added automatically from request for surgery [id number]  . GERD (gastroesophageal reflux disease)    OCC  . Goiter   . Hematuria 10/21/2018  . Hyperthyroidism   . Personal history of radiation therapy   . Sjogren's syndrome [facility])     Past Surgical History:  Procedure Laterality Date  . BREAST BIOPSY Left 05/12/2017   [place] bx/ INVASIVE MAMMARY CARCINOMA  . BREAST LUMPECTOMY Left 05/2017   invasive mammary carcinoma, neg margins  . BREAST LUMPECTOMY WITH SENTINEL LYMPH NODE BIOPSY Left 06/10/2017   Procedure: BREAST LUMPECTOMY WITH SENTINEL LYMPH NODE BX AND NEEDLE LOCALIZATION;  Surgeon: Robert Bellow, MD;  Location: [facility] ORS;  Service: General;  Laterality: Left;  . BREAST MAMMOSITE Left 06/24/2017   Procedure: MAMMOSITE BREAST;  Surgeon: Robert Bellow, MD;  Location: [facility] ORS;  Service: General;  Laterality: Left;  . CESAREAN SECTION  1995  . 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 COSMETIC SURGERY    . CYST EXCISION  2018   pilar cyst/ Dr Will Bonnet  .  EYE SURGERY    . PORTACATH PLACEMENT Right 07/08/2017   Procedure: INSERTION PORT-A-CATH;  Surgeon: Robert Bellow, MD;  Location: [facility] ORS;  Service: General;  Laterality: Right;  . TONSILLECTOMY      There were no vitals filed for this visit.   Subjective Assessment - 12/10/18 1309    Subjective  Patient reported in February started to have back pain and abdomen pain. She had blood in her urine. Patient went on antibiotic 3 times. Patient had a kidney stone that came out. Last Monday woke up in pain and no blood in urine. MD wants her to try pelvic floor therapy. Side effect of the present medication couses blurry vision. Patient Tamoxifen. Sometimes leaks urine. Paitent sits all day long.     Patient Stated Goals  reduce pain in back, pelvic pain and redue urinary leakage    Currently in Pain?  Yes    Pain Score  2     Pain Location  Back    Pain Orientation  Lower    Pain Descriptors / Indicators  Dull   pulling   Pain Type  Acute pain    Pain Onset  1 to 4 weeks ago    Pain Frequency  Intermittent    Aggravating Factors   bend forward, sitting,     Pain Relieving Factors  medication  Multiple Pain Sites  Yes    Pain Score  2    Pain Location  Abdomen    Pain Orientation  Lower    Pain Descriptors / Indicators  Aching    Pain Type  Acute pain    Pain Onset  More than a month ago    Pain Frequency  Intermittent    Aggravating Factors   sitting    Pain Relieving Factors  moving around         [facility] PT Assessment - 12/10/18 0001      Assessment   Medical Diagnosis  R10.2 Pelvic pain; S9373 Stress Incontinence; S2876 urinary urgency    Referring Provider (PT)  Dr. Jed Limerick, NP    Onset Date/Surgical Date  09/12/18    Prior Therapy  none      Precautions   Precautions  Other (comment)    Precaution Comments  Breast cancer      Restrictions   Weight Bearing Restrictions  No      Balance Screen   Has the patient fallen in the past 6 months  No    Has  the patient had a decrease in activity level because of a fear of falling?   No    Is the patient reluctant to leave their home because of a fear of falling?   No      Home Film/video editor residence      Prior Function   Level of Independence  Independent      Cognition   Overall Cognitive Status  Within Functional Limits for tasks assessed      Observation/Other Assessments   Skin Integrity  c-section scar is tight and decreased mobility      Posture/Postural Control   Posture/Postural Control  Postural limitations    Postural Limitations  Rounded Shoulders;Forward head;Decreased lumbar lordosis      ROM / Strength   AROM / PROM / Strength  AROM;PROM;Strength      AROM   Overall AROM Comments  lumbar ROM decreased by 50%, tightness of the lumbar skin and fascia      Strength   Right Hip ABduction  3+/5    Right Hip ADduction  3+/5    Left Hip ABduction  3+/5    Left Hip ADduction  4/5      Palpation   Spinal mobility  decreased mobility of the lower rib cage    Palpation comment  tenderness located throughout the abdomen; tenderness located in bilateral gluteal                Objective measurements completed on examination: See above findings.    Pelvic Floor Special Questions - 12/10/18 0001    Prior Pregnancies  Yes    Number of Pregnancies  3    Number of C-Sections  1    Number of Vaginal Deliveries  2    Currently Sexually Active  No    Urinary Leakage  Yes    Pad use  1 panty liner per day that is damp    Activities that cause leaking  Coughing;Sneezing;Laughing    Urinary urgency  Yes    Urinary frequency  goes every 3 hours    Fecal incontinence  No    Falling out feeling (prolapse)  No    Skin Integrity  Intact    Prolapse  None    Pelvic Floor Internal Exam  Patient confirms identification and approves PT to  assess pelvic floor muscles and treatment    Exam Type  Vaginal    Palpation  tenderness located on the  ischiocavernosus, levator ani, left urethra sphincter, obturator internist,     Strength  weak squeeze, no lift                 PT Short Term Goals - 12/10/18 1651      PT SHORT TERM GOAL #1   Title  independent with initial HEP for stretches and diaphragmatic breathing    Baseline  not educated yet    Time  4    Period  Weeks    Status  New    Target Date  01/07/19      PT SHORT TERM GOAL #2   Title  lumbar ROM improved by 25% due to increased tissue and spine mobility    Time  4    Period  Weeks    Status  New    Target Date  01/07/19      PT SHORT TERM GOAL #3   Title  pelvic floor contraction with a lift so strength increases to >/= 3/5    Baseline  strength is 2/5 with no lift    Time  4    Period  Weeks    Status  New    Target Date  01/07/19        PT Long Term Goals - 12/10/18 1654      PT LONG TERM GOAL #1   Title  independent with HEP and understand how to progress herself    Baseline  not educated yet    Time  4    Period  Months    Status  New    Target Date  04/11/19      PT LONG TERM GOAL #2   Title  ability to expand the abdomen due to improve lower rib mobility and tissue mobility to improve continence    Baseline  not able to expand abdomen and many trigger points in the abdomen    Time  4    Period  Months    Status  New    Target Date  04/11/19      PT LONG TERM GOAL #3   Title  pelvic floor strength >/= 3/5 holding for 10 seconds due to endurance and improved lengthening of the pelvic floor    Time  4    Period  Months    Status  New    Target Date  04/11/19      PT LONG TERM GOAL #4   Title  lumbar and lower abdominal pain decreased </= 1/10 50% of the time due to increased activity     Baseline  patient sits all day due to pain, pain level 2/10    Time  4    Period  Months    Status  New    Target Date  04/11/19             Plan - 12/10/18 1641    Clinical Impression Statement  Patient is a 58 year old female  with pelvic pain, uriary urgency and stress incontinence since 09/12/2018. Patient had a kidney stone and infection then. Patient has a history of breast cancer and Sjorgens syndrome. Patient reports her back and lower abdomen pain 2/10. Patient lumbar ROM is limited by 50%. Lower rib cage and abdomen has decreased mobility. Tenderness located in abdomen, gluteals and pelvic floor muscles. Bilateral hip abduction  and adduction is 3+/5. Patient has uriinary leakage with coughing, sneezing, and laughing. Patient has urinary urgency. Pelvic floor strength is 2/5. Patient sits all day and has trouble exercising due to pain in right knee and all over body aches. Patient would benefit from skilled therapy to work on returning to her to exercise with correct guidance, reduce pain and improve her overall mobility.     Personal Factors and Comorbidities  Comorbidity 1;Comorbidity 2    Comorbidities  breast cancer with radiation, chemotherapy and presently taking Tomaxifin; Sjorgens syndrome    Examination-Activity Limitations  Locomotion Level;Continence    Examination-Participation Restrictions  Community Activity;Driving;Cleaning;Shop    Stability/Clinical Decision Making  Evolving/Moderate complexity    Clinical Decision Making  Moderate    Rehab Potential  Excellent    PT Frequency  1x / week    PT Duration  Other (comment)   4 months   PT Treatment/Interventions  Biofeedback;Functional mobility training;Neuromuscular re-education;Therapeutic exercise;Therapeutic activities;Patient/family education;Manual techniques;Dry needling;Scar mobilization    PT Next Visit Plan  c-section scar massage; abdominal breathing; rib cage mobilization, lumbar stretches, soft tissue work on back toward the abdomen    Consulted and Agree with Plan of Care  Patient       Patient will benefit from skilled therapeutic intervention in order to improve the following deficits and impairments:  Increased fascial restricitons, Pain,  Decreased coordination, Decreased mobility, Decreased scar mobility, Increased muscle spasms, Decreased activity tolerance, Decreased range of motion, Decreased strength  Visit Diagnosis: Acute bilateral low back pain without sciatica - Plan: PT plan of care cert/re-cert  Muscle weakness (generalized) - Plan: PT plan of care cert/re-cert  Urinary, incontinence, stress female - Plan: PT plan of care cert/re-cert  Lower abdominal pain - Plan: PT plan of care cert/re-cert     Problem List Patient Active Problem List   Diagnosis Date Noted  . Facial flushing 10/21/2018  . Tinnitus of both ears 10/21/2018  . Drug-induced neutropenia ([place]) 10/20/2018  . 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 Thrombocytosis ([place]) 10/20/2018  . 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 Irregular menstrual cycle 03/02/2018  . Screening for cervical cancer 03/02/2018  . Long-term current use of tamoxifen 02/17/2018  . 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 Anxiety 10/03/2017  . Bilateral primary osteoarthritis of knee 10/03/2017  . 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 Malignant neoplasm of upper-inner quadrant of left breast in female, estrogen receptor positive ([place]) 05/22/2017  . Hirsutism 11/24/2015  . Hyperlipidemia 02/01/2014  . Impaired glucose tolerance 02/01/2014  . Vitamin D deficiency 02/01/2014  . Family history of diabetes mellitus 01/31/2014  . Morbid obesity with BMI of 40.0-44.9, adult ([place]) 01/31/2014  . Sjogren's syndrome ([place]) 01/17/2014    Earlie Counts, PT 12/10/18 5:01 PM   [facility] [address], [place], [place], [address] Phone: [phone]   Fax:  [phone]  Name: Sheena Martinez MRN: [id number] Date of Birth: 01/11/1961

## 2018-12-11 DIAGNOSIS — H40003 Preglaucoma, unspecified, bilateral: Secondary | ICD-10-CM | POA: Diagnosis not present

## 2018-12-11 DIAGNOSIS — H16002 Unspecified corneal ulcer, left eye: Secondary | ICD-10-CM | POA: Diagnosis not present

## 2018-12-11 DIAGNOSIS — Z947 Corneal transplant status: Secondary | ICD-10-CM | POA: Diagnosis not present

## 2018-12-15 ENCOUNTER — Ambulatory Visit: Payer: Medicaid Other | Attending: Urology | Admitting: Physical Therapy

## 2018-12-15 ENCOUNTER — Other Ambulatory Visit: Payer: Self-pay

## 2018-12-15 ENCOUNTER — Encounter: Payer: Self-pay | Admitting: Physical Therapy

## 2018-12-15 DIAGNOSIS — R1030 Lower abdominal pain, unspecified: Secondary | ICD-10-CM | POA: Diagnosis not present

## 2018-12-15 DIAGNOSIS — M545 Low back pain, unspecified: Secondary | ICD-10-CM

## 2018-12-15 DIAGNOSIS — N393 Stress incontinence (female) (male): Secondary | ICD-10-CM | POA: Diagnosis not present

## 2018-12-15 DIAGNOSIS — M6281 Muscle weakness (generalized): Secondary | ICD-10-CM

## 2018-12-15 NOTE — Patient Instructions (Signed)
Access Code: [email or web address]  URL: [email or web address]  Date: 12/15/2018  Prepared by: Earlie Counts   Exercises  Supine Pelvic Floor Contract and Release - 5 reps - 1 sets - 5 sec hold - 3x daily - 7x weekly  Hooklying Single Knee to Chest - 2 reps - 1 sets - 15 sec hold - 1x daily - 7x weekly  Supine Lower Trunk Rotation - 2 reps - 1 sets - 15 sec hold - 1x daily - 7x weekly  Modified Thomas Stretch - 1 reps - 1 sets - 30 sec hold - 1x daily - 7x weekly  Seated Hamstring Stretch - 1 reps - 1 sets - 30 sec hold - 1x daily - 7x weekly  Seated Piriformis Stretch with Trunk Bend - 1 reps - 1 sets - 30 sec hold - 1x daily - 7x weekly  Patient Education  [facility] [address], [place], [place] [address] Phone # [phone] Fax [phone]

## 2018-12-15 NOTE — Therapy (Signed)
[facility] [address] [place], [place], [address] Phone: [phone]   Fax:  [phone]  Physical Therapy Treatment  Patient Details  Name: Sheena Martinez MRN: [id number] Date of Birth: Sep 01, 1960 Referring Provider (PT): Dr. Jed Limerick, NP   Encounter Date: 12/15/2018  PT End of Session - 12/15/18 1404    Visit Number  2    Date for PT Re-Evaluation  04/11/19    Authorization Type  Medicaid    Authorization Time Period  12/14/2018 - 01/03/2019    Authorization - Visit Number  2    Authorization - Number of Visits  4    PT Start Time  1400    PT Stop Time  7858    PT Time Calculation (min)  45 min    Activity Tolerance  Patient tolerated treatment well;No increased pain    Behavior During Therapy  WFL for tasks assessed/performed       Past Medical History:  Diagnosis Date  . Anxiety   . Arthritis   . 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 Corneal perforation of left eye 10/09/2017   Overview:  Added automatically from request for surgery [id number]  . GERD (gastroesophageal reflux disease)    OCC  . Goiter   . Hematuria 10/21/2018  . Hyperthyroidism   . Personal history of radiation therapy   . Sjogren's syndrome [facility])     Past Surgical History:  Procedure Laterality Date  . BREAST BIOPSY Left 05/12/2017   [place] bx/ INVASIVE MAMMARY CARCINOMA  . BREAST LUMPECTOMY Left 05/2017   invasive mammary carcinoma, neg margins  . BREAST LUMPECTOMY WITH SENTINEL LYMPH NODE BIOPSY Left 06/10/2017   Procedure: BREAST LUMPECTOMY WITH SENTINEL LYMPH NODE BX AND NEEDLE LOCALIZATION;  Surgeon: Robert Bellow, MD;  Location: [facility] ORS;  Service: General;  Laterality: Left;  . BREAST MAMMOSITE Left 06/24/2017   Procedure: MAMMOSITE BREAST;  Surgeon: Robert Bellow, MD;  Location: [facility] ORS;  Service: General;  Laterality: Left;  . CESAREAN  SECTION  1995  . 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 COSMETIC SURGERY    . CYST EXCISION  2018   pilar cyst/ Dr Will Bonnet  . EYE SURGERY    . PORTACATH PLACEMENT Right 07/08/2017   Procedure: INSERTION PORT-A-CATH;  Surgeon: Robert Bellow, MD;  Location: [facility] ORS;  Service: General;  Laterality: Right;  . TONSILLECTOMY      There were no vitals filed for this visit.  Subjective Assessment - 12/15/18 1401    Subjective  they changed my medication but I still had blurry vision. Now my pains are back. The low back and lower abdomen pain is worse with being off the medication. Increased discomfort in the vagina.     Patient Stated Goals  reduce pain in back, pelvic pain and redue urinary leakage    Currently in Pain?  Yes    Pain Score  2     Pain Location  Back    Pain Orientation  Lower    Pain Descriptors / Indicators  Dull    Pain Type  Acute pain    Pain Onset  1 to 4 weeks ago    Pain Frequency  Intermittent    Aggravating Factors   bend forward, sitting    Pain Relieving Factors  heat    Multiple Pain Sites  Yes    Pain  Score  2    Pain Location  Abdomen    Pain Orientation  Lower    Pain Descriptors / Indicators  Aching    Pain Type  Acute pain    Pain Onset  More than a month ago    Pain Frequency  Intermittent    Aggravating Factors   sitting    Pain Relieving Factors  moving around                       [facility] Adult PT Treatment/Exercise - 12/15/18 0001      Therapeutic Activites    Therapeutic Activities  ADL's    ADL's  as she get up and down from the bed with breathing outward to decrease strain on the pelvic floor      Lumbar Exercises: Stretches   Active Hamstring Stretch  Right;Left;1 rep;30 seconds   sitting   Single Knee to Chest Stretch  Right;Left;2 reps;10 seconds    Lower Trunk Rotation  4 reps;10 seconds   supine   Hip Flexor Stretch  Right;Left;1 rep;30 seconds   supine   Piriformis Stretch  Right;Left;1 rep;30 seconds    sitting     Manual Therapy   Manual Therapy  Myofascial release;Soft tissue mobilization;Joint mobilization    Manual therapy comments  used an assistive device to pull up the facial and increase mobility    Joint Mobilization  bilateral rib mobilization to open up for bucket handle movement; L1-L5 grade 3 for P-A mobilization    Soft tissue mobilization  lumbar paraspinals, gluteals, abdomen and diaphgram    Myofascial Release  lower abdomen where her c-section scar is being careful of the skin; fascial release of the lumbar tissue in prone and quadruped pulling anteriorly        Trigger Point Dry Needling - 12/15/18 0001    Consent Given?  Yes    Education Handout Provided  Yes    Muscles Treated Back/Hip  Gluteus medius;Piriformis;Lumbar multifidi    Gluteus Medius Response  Twitch response elicited;Palpable increased muscle length    Piriformis Response  Twitch response elicited;Palpable increased muscle length    Lumbar multifidi Response  Twitch response elicited;Palpable increased muscle length           PT Education - 12/15/18 1444    Education Details  Access Code: [email or web address]    Person(s) Educated  Patient    Methods  Explanation;Demonstration;Verbal cues;Handout    Comprehension  Returned demonstration;Verbalized understanding       PT Short Term Goals - 12/15/18 1500      PT SHORT TERM GOAL #1   Title  independent with initial HEP for stretches and diaphragmatic breathing    Baseline  just got her first set of exercises    Time  4    Period  Weeks    Status  On-going      PT SHORT TERM GOAL #2   Title  lumbar ROM improved by 25% due to increased tissue and spine mobility    Time  4    Period  Weeks    Status  On-going      PT SHORT TERM GOAL #3   Title  pelvic floor contraction with a lift so strength increases to >/= 3/5    Time  4    Period  Weeks    Status  New        PT Long Term Goals - 12/10/18 1654  PT LONG TERM GOAL #1   Title   independent with HEP and understand how to progress herself    Baseline  not educated yet    Time  4    Period  Months    Status  New    Target Date  04/11/19      PT LONG TERM GOAL #2   Title  ability to expand the abdomen due to improve lower rib mobility and tissue mobility to improve continence    Baseline  not able to expand abdomen and many trigger points in the abdomen    Time  4    Period  Months    Status  New    Target Date  04/11/19      PT LONG TERM GOAL #3   Title  pelvic floor strength >/= 3/5 holding for 10 seconds due to endurance and improved lengthening of the pelvic floor    Time  4    Period  Months    Status  New    Target Date  04/11/19      PT LONG TERM GOAL #4   Title  lumbar and lower abdominal pain decreased </= 1/10 50% of the time due to increased activity     Baseline  patient sits all day due to pain, pain level 2/10    Time  4    Period  Months    Status  New    Target Date  04/11/19            Plan - 12/15/18 1405    Clinical Impression Statement  Patient has trigger points in the diaphragm and abdomen. Patient had increased mobility of the trunk after joint mobilization and fascial work. Patient is leaning how to expand the diaphgram to move the pelvic floor. Patient is learning how to breath out instead of putting pressure on the pelvic floor. Patient will continue to benefit from skilled therapy to improve muscle coordination and reduce leakage.     Personal Factors and Comorbidities  Comorbidity 1;Comorbidity 2    Comorbidities  breast cancer with radiation, chemotherapy and presently taking Tomaxifin; Sjorgens syndrome    Examination-Activity Limitations  Locomotion Level;Continence    Examination-Participation Restrictions  Community Activity;Driving;Cleaning;Shop    Stability/Clinical Decision Making  Evolving/Moderate complexity    Rehab Potential  Excellent    PT Frequency  1x / week    PT Treatment/Interventions   Biofeedback;Functional mobility training;Neuromuscular re-education;Therapeutic exercise;Therapeutic activities;Patient/family education;Manual techniques;Dry needling;Scar mobilization    PT Next Visit Plan  telehealth treatment; pelvic floor contraction in sitting with feeling lift from chair; self mobilization of diaphgram; diaphragmatic breathing    PT Home Exercise Plan  Access Code: [email or web address]    Recommended Other Services  MD signed initial eval    Consulted and Agree with Plan of Care  Patient       Patient will benefit from skilled therapeutic intervention in order to improve the following deficits and impairments:  Increased fascial restricitons, Pain, Decreased coordination, Decreased mobility, Decreased scar mobility, Increased muscle spasms, Decreased activity tolerance, Decreased range of motion, Decreased strength  Visit Diagnosis: Acute bilateral low back pain without sciatica  Muscle weakness (generalized)  Urinary, incontinence, stress female  Lower abdominal pain     Problem List Patient Active Problem List   Diagnosis Date Noted  . Facial flushing 10/21/2018  . Tinnitus of both ears 10/21/2018  . Drug-induced neutropenia ([place]) 10/20/2018  . 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 Thrombocytosis ([place]) 10/20/2018  . 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 Irregular menstrual cycle 03/02/2018  . Screening for cervical cancer 03/02/2018  . Long-term current use of tamoxifen 02/17/2018  . 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 Anxiety 10/03/2017  . Bilateral primary osteoarthritis of knee 10/03/2017  . 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 Malignant neoplasm of upper-inner quadrant of left breast in female, estrogen receptor positive ([place]) 05/22/2017  . Hirsutism 11/24/2015  . Hyperlipidemia 02/01/2014  . Impaired glucose tolerance 02/01/2014  . Vitamin D deficiency 02/01/2014  . Family history of  diabetes mellitus 01/31/2014  . Morbid obesity with BMI of 40.0-44.9, adult ([place]) 01/31/2014  . Sjogren's syndrome ([place]) 01/17/2014    Earlie Counts, PT 12/15/18 3:03 PM   [facility] [address] [place], [place], [address] Phone: [phone]   Fax:  [phone]  Name: PATRISHA HAUSMANN MRN: [id number] Date of Birth: 1960/12/02

## 2018-12-21 ENCOUNTER — Other Ambulatory Visit: Payer: Self-pay

## 2018-12-22 ENCOUNTER — Other Ambulatory Visit: Payer: Self-pay

## 2018-12-22 ENCOUNTER — Inpatient Hospital Stay: Payer: Medicaid Other | Attending: Oncology

## 2018-12-22 VITALS — BP 133/82 | HR 80 | Temp 98.0°F | Resp 20

## 2018-12-22 DIAGNOSIS — C50212 Malignant neoplasm of upper-inner quadrant of left female breast: Secondary | ICD-10-CM | POA: Diagnosis not present

## 2018-12-22 DIAGNOSIS — Z452 Encounter for adjustment and management of vascular access device: Secondary | ICD-10-CM | POA: Insufficient documentation

## 2018-12-22 DIAGNOSIS — Z95828 Presence of other vascular implants and grafts: Secondary | ICD-10-CM

## 2018-12-22 DIAGNOSIS — Z170 Estrogen receptor positive status [ER+]: Secondary | ICD-10-CM

## 2018-12-22 LAB — COMPREHENSIVE METABOLIC PANEL
ALT: 15 U/L (ref 0–44)
AST: 19 U/L (ref 15–41)
Albumin: 3.5 g/dL (ref 3.5–5.0)
Alkaline Phosphatase: 81 U/L (ref 38–126)
Anion gap: 5 (ref 5–15)
BUN: 17 mg/dL (ref 6–20)
CO2: 28 mmol/L (ref 22–32)
Calcium: 8.8 mg/dL — ABNORMAL LOW (ref 8.9–10.3)
Chloride: 108 mmol/L (ref 98–111)
Creatinine, Ser: 0.71 mg/dL (ref 0.44–1.00)
GFR calc Af Amer: >60 mL/min (ref >60)
GFR calc non Af Amer: >60 mL/min (ref >60)
Glucose, Bld: 98 mg/dL (ref 70–99)
Potassium: 4 mmol/L (ref 3.5–5.1)
Sodium: 141 mmol/L (ref 135–145)
Total Bilirubin: 0.3 mg/dL (ref 0.3–1.2)
Total Protein: 6.5 g/dL (ref 6.5–8.1)

## 2018-12-22 LAB — CBC WITH DIFFERENTIAL/PLATELET
Abs Immature Granulocytes: 0.01 10*3/uL (ref 0.00–0.07)
Basophils Absolute: 0 10*3/uL (ref 0.0–0.1)
Basophils Relative: 0 %
Eosinophils Absolute: 0.1 10*3/uL (ref 0.0–0.5)
Eosinophils Relative: 2 %
HCT: 37.9 % (ref 36.0–46.0)
Hemoglobin: 12.7 g/dL (ref 12.0–15.0)
Immature Granulocytes: 0 %
Lymphocytes Relative: 34 %
Lymphs Abs: 1.9 10*3/uL (ref 0.7–4.0)
MCH: 31.9 pg (ref 26.0–34.0)
MCHC: 33.5 g/dL (ref 30.0–36.0)
MCV: 95.2 fL (ref 80.0–100.0)
Monocytes Absolute: 0.7 10*3/uL (ref 0.1–1.0)
Monocytes Relative: 13 %
Neutro Abs: 2.8 10*3/uL (ref 1.7–7.7)
Neutrophils Relative %: 51 %
Platelets: 270 10*3/uL (ref 150–400)
RBC: 3.98 MIL/uL (ref 3.87–5.11)
RDW: 13.3 % (ref 11.5–15.5)
WBC: 5.5 10*3/uL (ref 4.0–10.5)
nRBC: 0 % (ref 0.0–0.2)

## 2018-12-22 MED ORDER — SODIUM CHLORIDE 0.9% FLUSH
10.0000 mL | Freq: Once | INTRAVENOUS | Status: AC
  Administered 2018-12-22: 14:00:00 10 mL via INTRAVENOUS
  Filled 2018-12-22: qty 10

## 2018-12-22 MED ORDER — HEPARIN SOD (PORK) LOCK FLUSH 100 UNIT/ML IV SOLN
500.0000 [IU] | Freq: Once | INTRAVENOUS | Status: AC
  Administered 2018-12-22: 14:00:00 500 [IU] via INTRAVENOUS

## 2018-12-23 ENCOUNTER — Inpatient Hospital Stay (HOSPITAL_BASED_OUTPATIENT_CLINIC_OR_DEPARTMENT_OTHER): Payer: Medicaid Other | Admitting: Oncology

## 2018-12-23 ENCOUNTER — Encounter: Payer: Self-pay | Admitting: Oncology

## 2018-12-23 DIAGNOSIS — E0590 Thyrotoxicosis, unspecified without thyrotoxic crisis or storm: Secondary | ICD-10-CM

## 2018-12-23 DIAGNOSIS — F411 Generalized anxiety disorder: Secondary | ICD-10-CM

## 2018-12-23 DIAGNOSIS — H16002 Unspecified corneal ulcer, left eye: Secondary | ICD-10-CM | POA: Diagnosis not present

## 2018-12-23 DIAGNOSIS — Z170 Estrogen receptor positive status [ER+]: Secondary | ICD-10-CM | POA: Diagnosis not present

## 2018-12-23 DIAGNOSIS — Z79810 Long term (current) use of selective estrogen receptor modulators (SERMs): Secondary | ICD-10-CM

## 2018-12-23 DIAGNOSIS — Z95828 Presence of other vascular implants and grafts: Secondary | ICD-10-CM | POA: Diagnosis not present

## 2018-12-23 DIAGNOSIS — C50212 Malignant neoplasm of upper-inner quadrant of left female breast: Secondary | ICD-10-CM

## 2018-12-23 NOTE — Progress Notes (Signed)
Patient contacted for telehealth visit. Pt states she had a kidney stone that passed. She also had a UTI that took 3 rounds of abt, which left her with  back pain and pressure in pelvic area, she is currently doing pelvic floor physical therapy.

## 2018-12-23 NOTE — Progress Notes (Signed)
HEMATOLOGY-ONCOLOGY TeleHEALTH VISIT PROGRESS NOTE  I connected with [name] on 12/23/18 at  1:15 PM EDT by video enabled telemedicine visit and verified that I am speaking with the correct person using two identifiers. I discussed the limitations, risks, security and privacy concerns of performing an evaluation and management service by telemedicine and the availability of in-person appointments. I also discussed with the patient that there may be a patient responsible charge related to this service. The patient expressed understanding and agreed to proceed.   Other persons participating in the visit and their role in the encounter:  Janeann Merl, RN, check in patient.   Patient's location: Home  Provider's location: Home office Chief Complaint: 4 months follow-up for stage IA, ER PR positive, HER-2 negative left breast cancer.   INTERVAL HISTORY Sheena Martinez is a 58 y.o. female who has above history reviewed by me today presents for follow up visit for management of stage IA left breast cancer-[id number] Problems and complaints are listed below:  Patient is currently on tamoxifen 20 mg daily.  Overall she tolerates well with mild to moderate side effects, mainly vasomotor symptoms including hot flash.  Manageable.  Patient follows up with ophthalmologist for corneal ulceration in the context of Sjogren syndrome. Patient reports receiving rituximab treatments.  She had developed infusion reaction during her recent rituximab treatments.  Hypothyroidism, on methimazole, During the interval, patient has one ED visit on 10/24/2018 due to bilateral flank pain, hematuria.  Work-up in ED found left 5 mm UVJ kidney stone.  Patient was given supportive care and pain medication.  [facility]abl[facility] [place].  Also had UTI and was treated with 3 rounds of antibiotics. She feels symptoms have improved.  Continue to have back pain in the pressure in the pelvic area.  She is  currently doing pelvic floor physical therapy.  She denies any concerns for her breast today.  Denies any fever, chills, nausea, vomiting, chest pain or shortness of breath.  Depression/anxiety currently on Zoloft and Ativan. Feels anxious due to COVID pandemic.  Review of Systems  Constitutional: Positive for fatigue. Negative for appetite change, chills and fever.  HENT:   Negative for hearing loss and voice change.   Eyes: Negative for eye problems.  Respiratory: Negative for chest tightness and cough.   Cardiovascular: Negative for chest pain.  Gastrointestinal: Negative for abdominal distention, abdominal pain and blood in stool.  Endocrine: Negative for hot flashes.  Genitourinary: Negative for difficulty urinating and frequency.   Musculoskeletal: Negative for arthralgias.  Skin: Negative for itching and rash.  Neurological: Negative for extremity weakness.  Hematological: Negative for adenopathy.  Psychiatric/Behavioral: Negative for confusion. The patient is nervous/anxious.     Past Medical History:  Diagnosis Date  . Anxiety   . Arthritis   . Breast cancer ([place]) 05/12/2017   INVASIVE MAMMARY CARCINOMA ER/PR positive LEFT BREAST UPPER inner  QUAD  . Corneal perforation of left eye 10/09/2017   Overview:  Added automatically from request for surgery [id number]  . GERD (gastroesophageal reflux disease)    OCC  . Goiter   . Hematuria 10/21/2018  . Hyperthyroidism   . Personal history of radiation therapy   . Sjogren's syndrome [facility])    Past Surgical History:  Procedure Laterality Date  . BREAST BIOPSY Left 05/12/2017   [place] bx/ INVASIVE MAMMARY CARCINOMA  . BREAST LUMPECTOMY Left 05/2017   invasive mammary carcinoma, neg margins  . BREAST LUMPECTOMY WITH SENTINEL LYMPH NODE BIOPSY  Left 06/10/2017   Procedure: BREAST LUMPECTOMY WITH SENTINEL LYMPH NODE BX AND NEEDLE LOCALIZATION;  Surgeon: Robert Bellow, MD;  Location: [facility] ORS;  Service: General;  Laterality: Left;  .  BREAST MAMMOSITE Left 06/24/2017   Procedure: MAMMOSITE BREAST;  Surgeon: Robert Bellow, MD;  Location: [facility] ORS;  Service: General;  Laterality: Left;  . CESAREAN SECTION  1995  . 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 COSMETIC SURGERY    . CYST EXCISION  2018   pilar cyst/ Dr Will Bonnet  . EYE SURGERY    . PORTACATH PLACEMENT Right 07/08/2017   Procedure: INSERTION PORT-A-CATH;  Surgeon: Robert Bellow, MD;  Location: [facility] ORS;  Service: General;  Laterality: Right;  . TONSILLECTOMY      Family History  Problem Relation Age of Onset  . Breast cancer Maternal Aunt 47       currently ~65  . Diabetes Mother   . Skin cancer Mother        currently 37; TAH/BSO (age?)  . Anemia Mother   . Liver disease Father        deceased / not much info about him / alcoholic  . Lung cancer Paternal Aunt        smoker / deceased 37s  . Stomach cancer Paternal Uncle 59       deceased / deceased 75s  . Ovarian cancer Maternal Grandmother 37       deceased 36s  . Stomach cancer Paternal Grandfather 68       deceased 40s  . Cancer Maternal Uncle        unk. type; deceased 36s  . Leukemia Cousin 33       deceased 59    Social History   Socioeconomic History  . Marital status: Married    Spouse name: Eulas Post  . Number of children: 3  . Years of education: Not on file  . Highest education level: GED or equivalent  Occupational History  . Occupation: unemployed  Social Needs  . Financial resource strain: Not on file  . Food insecurity:    Worry: Never true    Inability: Never true  . Transportation needs:    Medical: No    Non-medical: No  Tobacco Use  . Smoking status: Former Smoker    Years: 7.00    Types: Cigarettes    Last attempt to quit: 08/13/1979    Years since quitting: 39.3  . Smokeless tobacco: Never Used  . Tobacco comment: AGE [age]  Substance and Sexual Activity  . Alcohol use: No  . Drug use: No  . Sexual activity: Yes    Partners: Male  Lifestyle  . Physical  activity:    Days per week: 0 days    Minutes per session: 0 min  . Stress: Very much  Relationships  . Social connections:    Talks on phone: More than three times a week    Gets together: More than three times a week    Attends religious service: More than 4 times per year    Active member of club or organization: Yes    Attends meetings of clubs or organizations: More than 4 times per year    Relationship status: Married  . Intimate partner violence:    Fear of current or ex partner: No    Emotionally abused: No    Physically abused: No    Forced sexual activity: No  Other Topics Concern  . Not on file  Social History Narrative  . Not on file    Current Outpatient Medications on File Prior to Visit  Medication Sig Dispense Refill  . acetaminophen (TYLENOL) 500 MG tablet Take 1,000 mg by mouth every 8 (eight) hours as needed for mild pain or headache.    . brimonidine (ALPHAGAN) 0.2 % ophthalmic solution INSTILL 1 DROP INTO LEFT EYE THREE TIMES DAILY  12  . Ca Carbonate-Mag Hydroxide (ROLAIDS PO) Take 1 tablet as needed by mouth (indigestion).     . carboxymethylcellulose 1 % ophthalmic solution Place 1 drop into the left eye 4 (four) times daily.    . cholecalciferol (VITAMIN D) 1000 units tablet Take 2,000 Units by mouth daily.    Marland Kitchen ketoconazole (NIZORAL) 2 % shampoo Apply 1 application topically 2 (two) times a week. 120 mL 3  . LORazepam (ATIVAN) 0.5 MG tablet   0  . meloxicam (MOBIC) 15 MG tablet Take 7.5 mg by mouth daily.    . methimazole (TAPAZOLE) 10 MG tablet TAKE 1 TABLET BY MOUTH ONCE DAILY AT 6AM  2  . prednisoLONE acetate (PRED FORTE) 1 % ophthalmic suspension   1  . sertraline (ZOLOFT) 100 MG tablet Take 150 mg by mouth daily.  1  . tamoxifen (NOLVADEX) 20 MG tablet Take 1 tablet (20 mg total) by mouth daily. 30 tablet 1  . Timolol Maleate 0.5 % (DAILY) SOLN Place 1 drop into the left eye 2 (two) times daily.    . tizanidine (ZANAFLEX) 2 MG capsule Take 1 capsule  (2 mg total) by mouth 3 (three) times daily. 30 capsule 1  . VESICARE 5 MG tablet Take 5 mg by mouth daily.    . vitamin k 100 MCG tablet Take 100 mcg by mouth 2 (two) times a week.    . cycloSPORINE (RESTASIS) 0.05 % ophthalmic emulsion INSTILL 1 DROP INTO EACH EYE TWICE DAILY    . ketorolac (TORADOL) 10 MG tablet Take 1 tablet (10 mg total) by mouth every 8 (eight) hours as needed for severe pain. (Patient not taking: Reported on 12/23/2018) 20 tablet 0  . ondansetron (ZOFRAN) 4 MG tablet Take 1 tablet (4 mg total) by mouth every 8 (eight) hours as needed. (Patient not taking: Reported on 12/23/2018) 20 tablet 0  . oxybutynin (DITROPAN XL) 10 MG 24 hr tablet Take 10 mg by mouth at bedtime.    . vitamin C (ASCORBIC ACID) 500 MG tablet Take 500 mg by mouth daily.     No current facility-administered medications on file prior to visit.     Allergies  Allergen Reactions  . Rituximab Anaphylaxis  . Benadryl [Diphenhydramine] Other (See Comments)    Patient felt like she was going to "crawl out of her skin"  . Diphenhydramine Hcl   . Clonazepam Anxiety  . Sulfamethoxazole-Trimethoprim Rash    Chest tightness. Bactrim       Observations/Objective: Today's Vitals   12/23/18 1228  PainSc: 0-No pain   There is no height or weight on file to calculate BMI.  Physical Exam  Constitutional: She is oriented to person, place, and time and well-developed, well-nourished, and in no distress. No distress.  HENT:  Head: Atraumatic.  Pulmonary/Chest: Effort normal.  Neurological: She is alert and oriented to person, place, and time.  Psychiatric:  Emotional     CBC    Component Value Date/Time   WBC 5.5 12/22/2018 1357   RBC 3.98 12/22/2018 1357   HGB 12.7 12/22/2018 1357   HCT 37.9 12/22/2018  1357   PLT 270 12/22/2018 1357   MCV 95.2 12/22/2018 1357   MCH 31.9 12/22/2018 1357   MCHC 33.5 12/22/2018 1357   RDW 13.3 12/22/2018 1357   LYMPHSABS 1.9 12/22/2018 1357   MONOABS 0.7  12/22/2018 1357   EOSABS 0.1 12/22/2018 1357   BASOSABS 0.0 12/22/2018 1357    CMP     Component Value Date/Time   NA 141 12/22/2018 1357   K 4.0 12/22/2018 1357   CL 108 12/22/2018 1357   CO2 28 12/22/2018 1357   GLUCOSE 98 12/22/2018 1357   BUN 17 12/22/2018 1357   CREATININE 0.71 12/22/2018 1357   CALCIUM 8.8 (L) 12/22/2018 1357   PROT 6.5 12/22/2018 1357   ALBUMIN 3.5 12/22/2018 1357   AST 19 12/22/2018 1357   ALT 15 12/22/2018 1357   ALKPHOS 81 12/22/2018 1357   BILITOT 0.3 12/22/2018 1357   GFRNONAA >60 12/22/2018 1357   GFRAA >60 12/22/2018 1357     RADIOGRAPHIC STUDIES: I have personally reviewed the radiological images as listed and agreed with the findings in the report. 10/23/2018 CT renal stone study 5 mm obstructive stone at the left UVJ with secondary mild distal left hydroureter.  No other radiopaque calculi are identified within either kidney along the course of either renal collecting system.  Mild sigmoid diverticulosis without evidence for acute appendectomy colitis.  No other acute intra-abdominal or pelvic process.   Assessment and Plan: 1. Malignant neoplasm of upper-inner quadrant of left breast in female, estrogen receptor positive ([place])   2. Long-term current use of tamoxifen   3. Port-A-Cath in place   4. Hyperthyroidism   5. Cornea ulcer, left   6. Generalized anxiety disorder     #History of stage IA ER PR positive HER-2 negative left breast cancer Currently on adjuvant tamoxifen 20 mg daily. Tolerates well with mild to moderate vasovagal symptoms including hot flash.  Manageable. Continue tamoxifen She will need bilateral diagnostic mammogram in September 2020.  We will arrange.   #Port-A-Cath in place, we had a discussion about the timing of removing Mediport.  Patient feels that having the Mediport does not bother her, and she does not mind coming to do port maintenance.  Therefore I recommend patient to keep Mediport for 2 years -[October  2020] after surgery and then we can arrange the Mediport to be discontinued.  She voices understanding and agree with the plan. We will schedule port flush every 6 weeks x 6  #Left corneal ulcer secondary to autoimmune disorder/Sjogren's syndrome.  Continue follow-up with ophthalmology for management.    #Generalized anxiety disorder/depression currently on Zoloft and Ativan.  Previously discussed with her [facility] psychiatrist  Dr.Shelby Register who feels Zoloft is acceptable option to be combined with use of tamoxifen. Hypothyroidism, recommend patient continue follow-up with endocrinology.  On methimazole.    Follow Up Instructions: Mammogram in September 2020. Lab and MD assessment after mammogram.  Orders Placed This Encounter  Procedures  . MM DIAG BREAST TOMO BILATERAL    Standing Status:   Future    Standing Expiration Date:   12/23/2019    Order Specific Question:   Reason for Exam (SYMPTOM  OR DIAGNOSIS REQUIRED)    Answer:   history of left breast cancer status post    Order Specific Question:   Is the patient pregnant?    Answer:   No    Order Specific Question:   Preferred imaging location?    Answer:   [facility]  .  US Breast Limited [facility] Left [facility] [place]    Standing Status:   Future    Standing Expiration Date:   02/22/2020    Order Specific Question:   Reason for Exam (SYMPTOM  OR DIAGNOSIS REQUIRED)    Answer:   Personal history of left breast cancer status post    Order Specific Question:   Preferred imaging location?    Answer:   [facility]  . US Breast Limited [facility] Right [facility] [place]    Standing Status:   Future    Standing Expiration Date:   02/22/2020    Order Specific Question:   Reason for Exam (SYMPTOM  OR DIAGNOSIS REQUIRED)    Answer:   Personal history of left breast cancer status post    Order Specific Question:   Preferred imaging location?    Answer:   [facility]    I discussed the assessment and treatment plan with the patient. The  patient was provided an opportunity to ask questions and all were answered. The patient agreed with the plan and demonstrated an understanding of the instructions.  The patient was advised to call back or seek an in-person evaluation if the symptoms worsen or if the condition fails to improve as anticipated.   Earlie Server, MD 12/23/2018 11:04 PM

## 2018-12-24 ENCOUNTER — Other Ambulatory Visit: Payer: Self-pay

## 2018-12-24 ENCOUNTER — Ambulatory Visit: Payer: Self-pay | Admitting: Oncology

## 2018-12-25 ENCOUNTER — Other Ambulatory Visit: Payer: Self-pay

## 2018-12-25 ENCOUNTER — Ambulatory Visit: Payer: Medicaid Other | Admitting: Physical Therapy

## 2018-12-25 ENCOUNTER — Other Ambulatory Visit: Payer: Medicaid Other

## 2018-12-25 ENCOUNTER — Ambulatory Visit: Payer: Self-pay | Admitting: Oncology

## 2018-12-25 ENCOUNTER — Encounter: Payer: Self-pay | Admitting: Physical Therapy

## 2018-12-25 DIAGNOSIS — M6281 Muscle weakness (generalized): Secondary | ICD-10-CM

## 2018-12-25 DIAGNOSIS — M545 Low back pain, unspecified: Secondary | ICD-10-CM

## 2018-12-25 DIAGNOSIS — R1030 Lower abdominal pain, unspecified: Secondary | ICD-10-CM

## 2018-12-25 DIAGNOSIS — N393 Stress incontinence (female) (male): Secondary | ICD-10-CM | POA: Diagnosis not present

## 2018-12-25 NOTE — Therapy (Signed)
[facility] [address] [place], [place], [address] Phone: [phone]   Fax:  [phone]  Physical Therapy Treatment  Patient Details  Name: Sheena Martinez MRN: [id number] Date of Birth: 02-06-1961 Referring Provider (PT): Dr. Jed Limerick, NP   Encounter Date: 12/25/2018  PT End of Session - 12/25/18 1157    Visit Number  3    Date for PT Re-Evaluation  04/11/19    Authorization Type  Medicaid    Authorization Time Period  12/14/2018 - 01/03/2019    Authorization - Visit Number  3    Authorization - Number of Visits  3    PT Start Time  1115    PT Stop Time  1150    PT Time Calculation (min)  35 min    Activity Tolerance  Patient tolerated treatment well;No increased pain    Behavior During Therapy  WFL for tasks assessed/performed       Past Medical History:  Diagnosis Date  . Anxiety   . Arthritis   . Breast cancer ([place]) 05/12/2017   INVASIVE MAMMARY CARCINOMA ER/PR positive LEFT BREAST UPPER inner  QUAD  . Corneal perforation of left eye 10/09/2017   Overview:  Added automatically from request for surgery [id number]  . GERD (gastroesophageal reflux disease)    OCC  . Goiter   . Hematuria 10/21/2018  . Hyperthyroidism   . Personal history of radiation therapy   . Sjogren's syndrome [facility])     Past Surgical History:  Procedure Laterality Date  . BREAST BIOPSY Left 05/12/2017   [place] bx/ INVASIVE MAMMARY CARCINOMA  . BREAST LUMPECTOMY Left 05/2017   invasive mammary carcinoma, neg margins  . BREAST LUMPECTOMY WITH SENTINEL LYMPH NODE BIOPSY Left 06/10/2017   Procedure: BREAST LUMPECTOMY WITH SENTINEL LYMPH NODE BX AND NEEDLE LOCALIZATION;  Surgeon: Robert Bellow, MD;  Location: [facility] ORS;  Service: General;  Laterality: Left;  . BREAST MAMMOSITE Left 06/24/2017   Procedure: MAMMOSITE BREAST;  Surgeon: Robert Bellow, MD;  Location: [facility] ORS;  Service: General;  Laterality: Left;  . CESAREAN  SECTION  1995  . 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 COSMETIC SURGERY    . CYST EXCISION  2018   pilar cyst/ Dr Will Bonnet  . EYE SURGERY    . PORTACATH PLACEMENT Right 07/08/2017   Procedure: INSERTION PORT-A-CATH;  Surgeon: Robert Bellow, MD;  Location: [facility] ORS;  Service: General;  Laterality: Right;  . TONSILLECTOMY      There were no vitals filed for this visit.  Subjective Assessment - 12/25/18 1150    Subjective  Patient reports she is leaking less. She only leaks with sneezing. Patient reports she is having less pain since last visit. She is using a Actor that is helping.     Patient Stated Goals  reduce pain in back, pelvic pain and redue urinary leakage    Currently in Pain?  Yes    Pain Score  2     Pain Location  Back    Pain Orientation  Lower    Pain Descriptors / Indicators  Dull    Pain Type  Acute pain    Pain Onset  1 to 4 weeks ago    Pain Frequency  Intermittent    Aggravating Factors   bend forward, sitting    Pain Relieving Factors  heat    Multiple Pain Sites  Yes    Pain Score  2  Pain Location  Abdomen    Pain Orientation  Lower    Pain Descriptors / Indicators  Aching    Pain Type  Acute pain    Pain Onset  More than a month ago    Pain Frequency  Intermittent    Aggravating Factors   sitting    Pain Relieving Factors  moving around                       [facility] Adult PT Treatment/Exercise - 12/25/18 0001      Neuro Re-ed    Neuro Re-ed Details   diaphgramatic breathing in sitting with using her hands to feel the expansion of her stomach expanding; patient needed verbal and visual cues to expand her stomach at the same time when she breaths inward, patient needed to self mobilize the diaphgram in sitting to improve the expansion; sitting pelvic floor contraction with self soft tissue work to pelvic floor prior to the contraction, pelvic floor contraction in sitting with sneeze and lean forward, pelvic floor contraction with  hip abduction in sidely             PT Education - 12/25/18 1156    Education Details  Access Code: [email or web address] ; reviewed HEP while performing telehealth and looking at her [place] account    Person(s) Educated  Patient    Methods  Explanation;Demonstration;Verbal cues;Handout    Comprehension  Returned demonstration;Verbalized understanding       PT Short Term Goals - 12/25/18 1201      PT SHORT TERM GOAL #1   Title  independent with initial HEP for stretches and diaphragmatic breathing    Time  4    Period  Weeks    Status  On-going    Target Date  01/07/19      PT SHORT TERM GOAL #2   Title  lumbar ROM improved by 25% due to increased tissue and spine mobility    Time  4    Period  Weeks    Status  On-going    Target Date  01/07/19      PT SHORT TERM GOAL #3   Title  pelvic floor contraction with a lift so strength increases to >/= 3/5    Baseline  strength is 2/5 with no lift    Time  4    Period  Weeks    Status  On-going        PT Long Term Goals - 12/10/18 1654      PT LONG TERM GOAL #1   Title  independent with HEP and understand how to progress herself    Baseline  not educated yet    Time  4    Period  Months    Status  New    Target Date  04/11/19      PT LONG TERM GOAL #2   Title  ability to expand the abdomen due to improve lower rib mobility and tissue mobility to improve continence    Baseline  not able to expand abdomen and many trigger points in the abdomen    Time  4    Period  Months    Status  New    Target Date  04/11/19      PT LONG TERM GOAL #3   Title  pelvic floor strength >/= 3/5 holding for 10 seconds due to endurance and improved lengthening of the pelvic floor    Time  4  Period  Months    Status  New    Target Date  04/11/19      PT LONG TERM GOAL #4   Title  lumbar and lower abdominal pain decreased </= 1/10 50% of the time due to increased activity     Baseline  patient sits all day due to pain, pain level 2/10     Time  4    Period  Months    Status  New    Target Date  04/11/19            Plan - 12/25/18 1157    Clinical Impression Statement  Patient treatment was performed by Telehealth. Patient exercises were updated on [facility] while therapist was sharing her screen. Patient reports less pain and urinary leakage since last visit. Patient had difficulty with diaphgramtic breathing and needed many verbal and visual cues. Patient did not leakage with the pelvic floor contraction and sneeze while leaning forward during treatment. Patient will benefit from skilled therapy to improve muscle coordination and reduce leakage.     Personal Factors and Comorbidities  Comorbidity 1;Comorbidity 2    Comorbidities  breast cancer with radiation, chemotherapy and presently taking Tomaxifin; Sjorgens syndrome    Examination-Activity Limitations  Locomotion Level;Continence    Examination-Participation Restrictions  Community Activity;Driving;Cleaning;Shop    Stability/Clinical Decision Making  Evolving/Moderate complexity    Rehab Potential  Excellent    PT Frequency  1x / week    PT Duration  Other (comment)   4 months   PT Treatment/Interventions  Biofeedback;Functional mobility training;Neuromuscular re-education;Therapeutic exercise;Therapeutic activities;Patient/family education;Manual techniques;Dry needling;Scar mobilization    PT Next Visit Plan  see patient in person, perform soft tissue work to the pelvic floor and abdomen; joint mobilization to spine    PT Home Exercise Plan  Access Code: [email or web address]    Consulted and Agree with Plan of Care  Patient       Patient will benefit from skilled therapeutic intervention in order to improve the following deficits and impairments:  Increased fascial restricitons, Pain, Decreased coordination, Decreased mobility, Decreased scar mobility, Increased muscle spasms, Decreased activity tolerance, Decreased range of motion, Decreased strength  Visit  Diagnosis: Acute bilateral low back pain without sciatica  Muscle weakness (generalized)  Urinary, incontinence, stress female  Lower abdominal pain     Problem List Patient Active Problem List   Diagnosis Date Noted  . Facial flushing 10/21/2018  . Tinnitus of both ears 10/21/2018  . Drug-induced neutropenia ([place]) 10/20/2018  . 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 Thrombocytosis ([place]) 10/20/2018  . 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 Irregular menstrual cycle 03/02/2018  . Screening for cervical cancer 03/02/2018  . Long-term current use of tamoxifen 02/17/2018  . 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 Anxiety 10/03/2017  . Bilateral primary osteoarthritis of knee 10/03/2017  . 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 Malignant neoplasm of upper-inner quadrant of left breast in female, estrogen receptor positive ([place]) 05/22/2017  . Hirsutism 11/24/2015  . Hyperlipidemia 02/01/2014  . Impaired glucose tolerance 02/01/2014  . Vitamin D deficiency 02/01/2014  . Family history of diabetes mellitus 01/31/2014  . Morbid obesity with BMI of 40.0-44.9, adult ([place]) 01/31/2014  . Sjogren's syndrome ([place]) 01/17/2014    Earlie Counts, PT 12/25/18 12:03 PM   [facility] [address] [place], [place], [address] Phone: [phone]   Fax:  [phone]  Name: Sheena Martinez MRN: [id number] Date  of Birth: [birth year] Physical Therapy Telehealth Visit:  I connected with Dimple Casey  today at 11:15 by Webex video conference and verified that I am speaking with the correct person using two identifiers.  I discussed the limitations, risks, security and privacy concerns of performing an evaluation and management service by Webex and the availability of in person appointments.  I also discussed with the patient that there may be a patient responsible charge  related to this service. The patient expressed understanding and agreed to proceed.    The patient's address was confirmed.  Identified to the patient that therapist is a licensed Physical therapist in the state of [place].  Verified phone # as [phone] to call in case of technical difficulties. Earlie Counts, PT 12/25/18 12:05 PM

## 2018-12-25 NOTE — Patient Instructions (Signed)
Access Code: [email or web address]  URL: [email or web address]  Date: 12/25/2018  Prepared by: Earlie Counts   Exercises  Hooklying Single Knee to Chest - 2 reps - 1 sets - 15 sec hold - 1x daily - 7x weekly  Supine Lower Trunk Rotation - 2 reps - 1 sets - 15 sec hold - 1x daily - 7x weekly  Modified Thomas Stretch - 1 reps - 1 sets - 30 sec hold - 1x daily - 7x weekly  Seated Hamstring Stretch - 1 reps - 1 sets - 30 sec hold - 1x daily - 7x weekly  Seated Piriformis Stretch with Trunk Bend - 1 reps - 1 sets - 30 sec hold - 1x daily - 7x weekly  Seated Diaphragmatic Breathing - 4 reps - 1 sets - 2x daily - 7x weekly  Seated Cough with Pelvic Floor Contraction and Hand to Mouth - 5 reps - 1 sets - 1x daily - 7x weekly  Sidelying Pelvic Floor Contraction with Hip Abduction - 10 reps - 1 sets - 1x daily - 7x weekly  Seated Pelvic Floor Contraction - 5 reps - 1 sets - 10 sec hold - 3x daily - 7x weekly  Patient Education  [facility] [address] [place], [place] [address] Phone # [phone] Fax [phone]

## 2018-12-30 DIAGNOSIS — Z947 Corneal transplant status: Secondary | ICD-10-CM | POA: Diagnosis not present

## 2018-12-30 DIAGNOSIS — H02056 Trichiasis without entropian left eye, unspecified eyelid: Secondary | ICD-10-CM | POA: Diagnosis not present

## 2018-12-30 DIAGNOSIS — H16002 Unspecified corneal ulcer, left eye: Secondary | ICD-10-CM | POA: Diagnosis not present

## 2018-12-31 ENCOUNTER — Other Ambulatory Visit: Payer: Self-pay

## 2018-12-31 ENCOUNTER — Ambulatory Visit: Payer: Medicaid Other | Admitting: Physical Therapy

## 2018-12-31 ENCOUNTER — Encounter: Payer: Self-pay | Admitting: Physical Therapy

## 2018-12-31 DIAGNOSIS — R1030 Lower abdominal pain, unspecified: Secondary | ICD-10-CM | POA: Diagnosis not present

## 2018-12-31 DIAGNOSIS — M545 Low back pain, unspecified: Secondary | ICD-10-CM

## 2018-12-31 DIAGNOSIS — M6281 Muscle weakness (generalized): Secondary | ICD-10-CM | POA: Diagnosis not present

## 2018-12-31 DIAGNOSIS — N393 Stress incontinence (female) (male): Secondary | ICD-10-CM

## 2018-12-31 NOTE — Therapy (Signed)
[facility] [address] [place], [place], [address] Phone: [phone]   Fax:  [phone]  Physical Therapy Treatment  Patient Details  Name: Sheena Martinez MRN: [id number] Date of Birth: [birth year] Referring Provider (PT): Dr. Jed Limerick, NP   Encounter Date: 12/31/2018  PT End of Session - 12/31/18 1452    Visit Number  4    Date for PT Re-Evaluation  04/11/19    Authorization Type  Medicaid    Authorization Time Period  12/14/2018 - 01/03/2019    Authorization - Visit Number  3    Authorization - Number of Visits  3    PT Start Time  3151   late   PT Stop Time  7616    PT Time Calculation (min)  38 min    Activity Tolerance  Patient tolerated treatment well;No increased pain    Behavior During Therapy  WFL for tasks assessed/performed       Past Medical History:  Diagnosis Date  . Anxiety   . Arthritis   . 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 Corneal perforation of left eye 10/09/2017   Overview:  Added automatically from request for surgery [id number]  . GERD (gastroesophageal reflux disease)    OCC  . Goiter   . Hematuria 10/21/2018  . Hyperthyroidism   . Personal history of radiation therapy   . Sjogren's syndrome [facility])     Past Surgical History:  Procedure Laterality Date  . BREAST BIOPSY Left 05/12/2017   [place] bx/ INVASIVE MAMMARY CARCINOMA  . BREAST LUMPECTOMY Left 05/2017   invasive mammary carcinoma, neg margins  . BREAST LUMPECTOMY WITH SENTINEL LYMPH NODE BIOPSY Left 06/10/2017   Procedure: BREAST LUMPECTOMY WITH SENTINEL LYMPH NODE BX AND NEEDLE LOCALIZATION;  Surgeon: Robert Bellow, MD;  Location: [facility] ORS;  Service: General;  Laterality: Left;  . BREAST MAMMOSITE Left 06/24/2017   Procedure: MAMMOSITE BREAST;  Surgeon: Robert Bellow, MD;  Location: [facility] ORS;  Service: General;  Laterality: Left;  .  CESAREAN SECTION  1995  . 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 COSMETIC SURGERY    . CYST EXCISION  2018   pilar cyst/ Dr Will Bonnet  . EYE SURGERY    . PORTACATH PLACEMENT Right 07/08/2017   Procedure: INSERTION PORT-A-CATH;  Surgeon: Robert Bellow, MD;  Location: [facility] ORS;  Service: General;  Laterality: Right;  . TONSILLECTOMY      There were no vitals filed for this visit.  Subjective Assessment - 12/31/18 1453    Subjective   I have not been able to do my exercises due to being busy. Urnary leakage is 95% better. Patient reports her pain is 1-2/10.     Patient Stated Goals  reduce pain in back, pelvic pain and redue urinary leakage    Currently in Pain?  Yes    Pain Score  2     Pain Location  Back    Pain Orientation  Lower    Pain Descriptors / Indicators  Dull    Pain Type  Acute pain    Pain Onset  1 to 4 weeks ago    Pain Frequency  Intermittent    Aggravating Factors   bending forward, sitting    Pain Relieving Factors  heat    Multiple Pain Sites  No         [facility] PT Assessment -  12/31/18 0001      Assessment   Medical Diagnosis  R10.2 Pelvic pain; N3933 Stress Incontinence; N2778 urinary urgency    Referring Provider (PT)  Dr. Jed Limerick, NP    Onset Date/Surgical Date  09/12/18    Prior Therapy  none      Precautions   Precautions  Other (comment)    Precaution Comments  Breast cancer      Restrictions   Weight Bearing Restrictions  No      [place] residence      Prior Function   Level of Independence  Independent      Cognition   Overall Cognitive Status  Within Functional Limits for tasks assessed      Observation/Other Assessments   Skin Integrity  c-section scar is tight and decreased mobility      Posture/Postural Control   Posture/Postural Control  Postural limitations    Postural Limitations  Rounded Shoulders;Forward head;Decreased lumbar lordosis      AROM   Overall AROM Comments   lumbar ROM decreased by 25%, tightness of the lumbar skin and fascia      Strength   Right Hip ABduction  4/5    Right Hip ADduction  5/5    Left Hip ABduction  3+/5    Left Hip ADduction  5/5                Pelvic Floor Special Questions - 12/31/18 0001    Urinary Leakage  Yes   95% better   Pad use  1 panty liner per day that is damp    Pelvic Floor Internal Exam  Patient confirms identification and approves PT to assess pelvic floor muscles and treatment    Exam Type  Vaginal    Palpation  improve mobility of the urethra sphincter, less tenderness than when the initial eval was done in the pelvic floor muscles    Strength  weak squeeze, no lift        [facility] Adult PT Treatment/Exercise - 12/31/18 0001      Neuro Re-ed    Neuro Re-ed Details   pelvic floor contraction with tactile cues to improve circular contraction and add lift      Manual Therapy   Manual Therapy  Soft tissue mobilization;Myofascial release;Internal Pelvic Floor    Soft tissue mobilization  abdominal massage to improve tissue mobility   caused patient to have the urge to have an urge for BM   Myofascial Release  to c-section scar to release the fascia and scar tissue    Internal Pelvic Floor  introitus, urethra sphincter, bulbocavernosus, ischicavernosus, obturator internist               PT Short Term Goals - 12/31/18 1543      PT SHORT TERM GOAL #2   Title  lumbar ROM improved by 25% due to increased tissue and spine mobility    Time  4    Period  Weeks    Status  Achieved        PT Long Term Goals - 12/10/18 1654      PT LONG TERM GOAL #1   Title  independent with HEP and understand how to progress herself    Baseline  not educated yet    Time  4    Period  Months    Status  New    Target Date  04/11/19      PT LONG  TERM GOAL #2   Title  ability to expand the abdomen due to improve lower rib mobility and tissue mobility to improve continence    Baseline  not able to  expand abdomen and many trigger points in the abdomen    Time  4    Period  Months    Status  New    Target Date  04/11/19      PT LONG TERM GOAL #3   Title  pelvic floor strength >/= 3/5 holding for 10 seconds due to endurance and improved lengthening of the pelvic floor    Time  4    Period  Months    Status  New    Target Date  04/11/19      PT LONG TERM GOAL #4   Title  lumbar and lower abdominal pain decreased </= 1/10 50% of the time due to increased activity     Baseline  patient sits all day due to pain, pain level 2/10    Time  4    Period  Months    Status  New    Target Date  04/11/19            Plan - 12/31/18 1539    Clinical Impression Statement  Patient understands her HEP but has not had time to perform them. Patient lumbar ROM has improved by 25%. Pelvic floor strength is 2/5 with no lift but increased circular contraction. patient reports her urinary leakage has improved by 95%.  Patient reports no abdominal pain just back pain that averages 1/10. Patient still has tightness in the abdominal tissue. Patient still needs tactile cues for pelvic floor contraction. Patient has increased mobility of the c-section scar. Patient will benefit from skilled therapy to improve muscle coordination and reduce leakage.     Personal Factors and Comorbidities  Comorbidity 1;Comorbidity 2    Comorbidities  breast cancer with radiation, chemotherapy and presently taking Tomaxifin; Sjorgens syndrome    Examination-Activity Limitations  Locomotion Level;Continence    Examination-Participation Restrictions  Community Activity;Driving;Cleaning;Shop    Stability/Clinical Decision Making  Evolving/Moderate complexity    Rehab Potential  Excellent    PT Frequency  1x / week    PT Duration  Other (comment)   4 months   PT Treatment/Interventions  Biofeedback;Functional mobility training;Neuromuscular re-education;Therapeutic exercise;Therapeutic activities;Patient/family  education;Manual techniques;Dry needling;Scar mobilization    PT Next Visit Plan  transfer to [place] PT pelvic floor due to closer to home, work on pelvic floor contraction, abdominal and c-section scar massage; abdominal strength    PT Home Exercise Plan  Access Code: [email or web address]    Consulted and Agree with Plan of Care  Patient       Patient will benefit from skilled therapeutic intervention in order to improve the following deficits and impairments:  Increased fascial restricitons, Pain, Decreased coordination, Decreased mobility, Decreased scar mobility, Increased muscle spasms, Decreased activity tolerance, Decreased range of motion, Decreased strength  Visit Diagnosis: Acute bilateral low back pain without sciatica  Muscle weakness (generalized)  Urinary, incontinence, stress female     Problem List Patient Active Problem List   Diagnosis Date Noted  . Facial flushing 10/21/2018  . Tinnitus of both ears 10/21/2018  . Drug-induced neutropenia ([place]) 10/20/2018  . 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 Thrombocytosis ([place]) 10/20/2018  . 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 Irregular menstrual cycle 03/02/2018  . Screening for cervical cancer 03/02/2018  . Long-term current use of tamoxifen  02/17/2018  . 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 Anxiety 10/03/2017  . Bilateral primary osteoarthritis of knee 10/03/2017  . 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 Malignant neoplasm of upper-inner quadrant of left breast in female, estrogen receptor positive ([place]) 05/22/2017  . Hirsutism 11/24/2015  . Hyperlipidemia 02/01/2014  . Impaired glucose tolerance 02/01/2014  . Vitamin D deficiency 02/01/2014  . Family history of diabetes mellitus 01/31/2014  . Morbid obesity with BMI of 40.0-44.9, adult ([place]) 01/31/2014  . Sjogren's syndrome ([place]) 01/17/2014    Earlie Counts, PT 12/31/18 3:46 PM   [facility] [address], [place], [place], [address] Phone: [phone]   Fax:  [phone]  Name: DAYSHIA BALLINAS MRN: [id number] Date of Birth: 1960/10/03

## 2019-01-05 DIAGNOSIS — H9319 Tinnitus, unspecified ear: Secondary | ICD-10-CM | POA: Diagnosis not present

## 2019-01-05 DIAGNOSIS — F410 Panic disorder [episodic paroxysmal anxiety] without agoraphobia: Secondary | ICD-10-CM | POA: Diagnosis not present

## 2019-01-05 DIAGNOSIS — F419 Anxiety disorder, unspecified: Secondary | ICD-10-CM | POA: Diagnosis not present

## 2019-01-11 DIAGNOSIS — M545 Low back pain: Secondary | ICD-10-CM | POA: Diagnosis not present

## 2019-01-11 DIAGNOSIS — R3915 Urgency of urination: Secondary | ICD-10-CM | POA: Diagnosis not present

## 2019-01-11 DIAGNOSIS — N393 Stress incontinence (female) (male): Secondary | ICD-10-CM | POA: Diagnosis not present

## 2019-01-11 DIAGNOSIS — R102 Pelvic and perineal pain: Secondary | ICD-10-CM | POA: Diagnosis not present

## 2019-01-12 ENCOUNTER — Other Ambulatory Visit: Payer: Self-pay

## 2019-01-12 ENCOUNTER — Ambulatory Visit: Payer: Medicaid Other | Attending: Family Medicine | Admitting: Physical Therapy

## 2019-01-12 DIAGNOSIS — R1030 Lower abdominal pain, unspecified: Secondary | ICD-10-CM

## 2019-01-12 DIAGNOSIS — M6281 Muscle weakness (generalized): Secondary | ICD-10-CM | POA: Insufficient documentation

## 2019-01-12 DIAGNOSIS — N393 Stress incontinence (female) (male): Secondary | ICD-10-CM | POA: Insufficient documentation

## 2019-01-12 DIAGNOSIS — M545 Low back pain, unspecified: Secondary | ICD-10-CM

## 2019-01-12 NOTE — Therapy (Signed)
[place] [facility]IN [facility] SERVICES [address] [place], [place], [address] Phone: [phone]   Fax:  [phone]  Physical Therapy Treatment  Patient Details  Name: Sheena Martinez MRN: [id number] Date of Birth: 06-Jun-1961 Referring Provider (PT): Dr. Jed Limerick, NP   Encounter Date: 01/12/2019  PT End of Session - 01/12/19 1502    Visit Number  5    Date for PT Re-Evaluation  04/11/19    Authorization Type  Medicaid 04/07/19    Authorization - Visit Number  1    PT Start Time  1602    PT Stop Time  1702    PT Time Calculation (min)  60 min    Activity Tolerance  Patient tolerated treatment well;No increased pain    Behavior During Therapy  WFL for tasks assessed/performed       Past Medical History:  Diagnosis Date  . Anxiety   . Arthritis   . 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 Corneal perforation of left eye 10/09/2017   Overview:  Added automatically from request for surgery [id number]  . GERD (gastroesophageal reflux disease)    OCC  . Goiter   . Hematuria 10/21/2018  . Hyperthyroidism   . Personal history of radiation therapy   . Sjogren's syndrome [facility])     Past Surgical History:  Procedure Laterality Date  . BREAST BIOPSY Left 05/12/2017   [place] bx/ INVASIVE MAMMARY CARCINOMA  . BREAST LUMPECTOMY Left 05/2017   invasive mammary carcinoma, neg margins  . BREAST LUMPECTOMY WITH SENTINEL LYMPH NODE BIOPSY Left 06/10/2017   Procedure: BREAST LUMPECTOMY WITH SENTINEL LYMPH NODE BX AND NEEDLE LOCALIZATION;  Surgeon: Robert Bellow, MD;  Location: [facility] ORS;  Service: General;  Laterality: Left;  . BREAST MAMMOSITE Left 06/24/2017   Procedure: MAMMOSITE BREAST;  Surgeon: Robert Bellow, MD;  Location: [facility] ORS;  Service: General;  Laterality: Left;  . CESAREAN SECTION  1995  . 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 COSMETIC SURGERY    . CYST EXCISION   2018   pilar cyst/ Dr Will Bonnet  . EYE SURGERY    . PORTACATH PLACEMENT Right 07/08/2017   Procedure: INSERTION PORT-A-CATH;  Surgeon: Robert Bellow, MD;  Location: [facility] ORS;  Service: General;  Laterality: Right;  . TONSILLECTOMY      There were no vitals filed for this visit.  Subjective Assessment - 01/12/19 1407    Subjective  Pt reports she noticed her anxiety started with heart races and pelvic pain.  Pt has been dealing some many physical things over a year. "I wish I could wake up with a different mindset." " I dont feel normal".   Pt's grandkids give her joy. Pt sees her 58 yr old alot and hangs out her house.  Pt reports R arm pain.       Patient Stated Goals  reduce pain in back, pelvic pain and redue urinary leakage    Pain Onset  1 to 4 weeks ago         [facility] PT Assessment - 01/12/19 1457      Observation/Other Assessments   Observations  R scapular delayed ( in shoulder abduction)        Posture/Postural Control   Posture Comments  ankles crossed in seating, narrow BOS with standing       Palpation   Spinal mobility  Significant tightness R  paraspinal/ medial posteror attachments to R scapula,     SI assessment   R SIJ posteriorly rotated, Tenderness at PSIS  and tight.        Ambulation/Gait   Gait Comments  narrow BOS, cued for wider BOS, no knee pain                    [facility] Adult PT Treatment/Exercise - 01/12/19 1457      Neuro Re-ed    Neuro Re-ed Details   excessive cues for feet on ground in seating, propioception of feet under hips       Modalities   Modalities  Moist Heat      Moist Heat Therapy   Number Minutes Moist Heat  5 Minutes    Moist Heat Location  Cervical;Lumbar Spine      Manual Therapy   Manual therapy comments  long distraction on R, AP mob R SIJ ( FADDIR, FADDER) ,  GRade III mob at spinal, STM/ MWM at problem areas noted in assessment                PT Short Term Goals - 12/31/18 1543      PT SHORT TERM  GOAL #2   Title  lumbar ROM improved by 25% due to increased tissue and spine mobility    Time  4    Period  Weeks    Status  Achieved        PT Long Term Goals - 12/10/18 1654      PT LONG TERM GOAL #1   Title  independent with HEP and understand how to progress herself    Baseline  not educated yet    Time  4    Period  Months    Status  New    Target Date  04/11/19      PT LONG TERM GOAL #2   Title  ability to expand the abdomen due to improve lower rib mobility and tissue mobility to improve continence    Baseline  not able to expand abdomen and many trigger points in the abdomen    Time  4    Period  Months    Status  New    Target Date  04/11/19      PT LONG TERM GOAL #3   Title  pelvic floor strength >/= 3/5 holding for 10 seconds due to endurance and improved lengthening of the pelvic floor    Time  4    Period  Months    Status  New    Target Date  04/11/19      PT LONG TERM GOAL #4   Title  lumbar and lower abdominal pain decreased </= 1/10 50% of the time due to increased activity     Baseline  patient sits all day due to pain, pain level 2/10    Time  4    Period  Months    Status  New    Target Date  04/11/19            Plan - 01/12/19 1503    Clinical Impression Statement Post Tx, Pt demo'd increased R SIJ mobility and had no more tenderness at R PSIS. Pt showed increased thoracic mobility for optimal deep core coordination. Provided cues for wider feet placement in gait to minimize knee pain. Provided biopsychosocial strategies tob etter manage mm tightness in body when stressed. Pt responded well to mindfulness body scan technique. Pt continues to  benefit from skilled PT.    Personal Factors and Comorbidities  Comorbidity 1;Comorbidity 2    Comorbidities  breast cancer with radiation, chemotherapy and presently taking Tomaxifin; Sjorgens syndrome    Examination-Activity Limitations  Locomotion Level;Continence    Examination-Participation  Restrictions  Community Activity;Driving;Cleaning;Shop    Stability/Clinical Decision Making  Evolving/Moderate complexity    Rehab Potential  Excellent    PT Frequency  1x / week    PT Duration  Other (comment)   4 months   PT Treatment/Interventions  Biofeedback;Functional mobility training;Neuromuscular re-education;Therapeutic exercise;Therapeutic activities;Patient/family education;Manual techniques;Dry needling;Scar mobilization    PT Next Visit Plan  transfer to [place] PT pelvic floor due to closer to home, work on pelvic floor contraction, abdominal and c-section scar massage; abdominal strength    PT Home Exercise Plan  Access Code: [email or web address]    Consulted and Agree with Plan of Care  Patient       Patient will benefit from skilled therapeutic intervention in order to improve the following deficits and impairments:  Increased fascial restricitons, Pain, Decreased coordination, Decreased mobility, Decreased scar mobility, Increased muscle spasms, Decreased activity tolerance, Decreased range of motion, Decreased strength  Visit Diagnosis: Acute bilateral low back pain without sciatica  Muscle weakness (generalized)  Urinary, incontinence, stress female  Lower abdominal pain     Problem List Patient Active Problem List   Diagnosis Date Noted  . Facial flushing 10/21/2018  . Tinnitus of both ears 10/21/2018  . Drug-induced neutropenia ([place]) 10/20/2018  . 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 Thrombocytosis ([place]) 10/20/2018  . 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 Irregular menstrual cycle 03/02/2018  . Screening for cervical cancer 03/02/2018  . Long-term current use of tamoxifen 02/17/2018  . 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 Anxiety 10/03/2017  . Bilateral primary osteoarthritis of knee 10/03/2017  . 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 Malignant neoplasm of upper-inner quadrant of left breast  in female, estrogen receptor positive ([place]) 05/22/2017  . Hirsutism 11/24/2015  . Hyperlipidemia 02/01/2014  . Impaired glucose tolerance 02/01/2014  . Vitamin D deficiency 02/01/2014  . Family history of diabetes mellitus 01/31/2014  . Morbid obesity with BMI of 40.0-44.9, adult ([place]) 01/31/2014  . Sjogren's syndrome ([place]) 01/17/2014    Jerl Mina ,PT, DPT, E-RYT  01/12/2019, 3:04 PM  [place] MAIN [facility] SERVICES [address] [place], [place], [address] Phone: [phone]   Fax:  [phone]  Name: Sheena Martinez MRN: [id number] Date of Birth: 03/04/1961

## 2019-01-12 NOTE — Patient Instructions (Addendum)
Loosen up midback   Open book ( handout) 15 reps    Loosen up hips  Standing:  10 reps on both sides x 3 x day     3 point tap   Feet are hip width Tap forward, center under hip not feet next to each other  Tap middle\, center  Tap back    Body scan 3 cycles  ( audio file)     ___   Place feet on ground when sitting   Walk 5 min around the house in yard flat ground no holes in grass  Wide feet under hips  Stretches after walking ____  Gratitude / accomplish documentation in your calendar

## 2019-01-18 DIAGNOSIS — H9319 Tinnitus, unspecified ear: Secondary | ICD-10-CM | POA: Diagnosis not present

## 2019-01-20 ENCOUNTER — Other Ambulatory Visit: Payer: Self-pay

## 2019-01-20 ENCOUNTER — Ambulatory Visit: Payer: Medicaid Other | Admitting: Physical Therapy

## 2019-01-20 DIAGNOSIS — R1030 Lower abdominal pain, unspecified: Secondary | ICD-10-CM

## 2019-01-20 DIAGNOSIS — M6281 Muscle weakness (generalized): Secondary | ICD-10-CM | POA: Diagnosis not present

## 2019-01-20 DIAGNOSIS — M545 Low back pain, unspecified: Secondary | ICD-10-CM

## 2019-01-20 DIAGNOSIS — N393 Stress incontinence (female) (male): Secondary | ICD-10-CM | POA: Diagnosis not present

## 2019-01-20 NOTE — Therapy (Signed)
[place] MAIN [facility] SERVICES [address] [place], [place], [address] Phone: [phone]   Fax:  [phone]  Physical Therapy Treatment  Patient Details  Name: Sheena Martinez MRN: [id number] Date of Birth: 01/21/1961 Referring Provider (PT): Dr. Jed Limerick, NP   Encounter Date: 01/20/2019  PT End of Session - 01/20/19 1146    Visit Number  6    Date for PT Re-Evaluation  04/11/19    Authorization Type  Medicaid 04/07/19    Authorization - Visit Number  2    PT Start Time  1030    PT Stop Time  7048    PT Time Calculation (min)  75 min    Activity Tolerance  Patient tolerated treatment well;No increased pain    Behavior During Therapy  WFL for tasks assessed/performed       Past Medical History:  Diagnosis Date  . Anxiety   . Arthritis   . 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 Corneal perforation of left eye 10/09/2017   Overview:  Added automatically from request for surgery [id number]  . GERD (gastroesophageal reflux disease)    OCC  . Goiter   . Hematuria 10/21/2018  . Hyperthyroidism   . Personal history of radiation therapy   . Sjogren's syndrome [facility])     Past Surgical History:  Procedure Laterality Date  . BREAST BIOPSY Left 05/12/2017   [place] bx/ INVASIVE MAMMARY CARCINOMA  . BREAST LUMPECTOMY Left 05/2017   invasive mammary carcinoma, neg margins  . BREAST LUMPECTOMY WITH SENTINEL LYMPH NODE BIOPSY Left 06/10/2017   Procedure: BREAST LUMPECTOMY WITH SENTINEL LYMPH NODE BX AND NEEDLE LOCALIZATION;  Surgeon: Robert Bellow, MD;  Location: [facility] ORS;  Service: General;  Laterality: Left;  . BREAST MAMMOSITE Left 06/24/2017   Procedure: MAMMOSITE BREAST;  Surgeon: Robert Bellow, MD;  Location: [facility] ORS;  Service: General;  Laterality: Left;  . CESAREAN SECTION  1995  . 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 COSMETIC SURGERY    . CYST EXCISION   2018   pilar cyst/ Dr Will Bonnet  . EYE SURGERY    . PORTACATH PLACEMENT Right 07/08/2017   Procedure: INSERTION PORT-A-CATH;  Surgeon: Robert Bellow, MD;  Location: [facility] ORS;  Service: General;  Laterality: Right;  . TONSILLECTOMY      There were no vitals filed for this visit.  Subjective Assessment - 01/20/19 1033    Subjective  Pt was in so much pain after last session that lasted 2 days. It was on the other side ( L) that did not get worked on. Pt is back at her baseline pain 2/10 for the past 5 days. Pt sleeps on her R side and experiences mnumbness upon waking      Patient Stated Goals  reduce pain in back, pelvic pain and redue urinary leakage    Pain Onset  1 to 4 weeks ago         [facility] PT Assessment - 01/20/19 1121      Observation/Other Assessments   Observations  simulated sleeping position on R ( shoulder IR)        AROM   Overall AROM Comments  WFL BUE       Strength   Overall Strength Comments  shoulder ext R 3+/5, L 4+/5. hands behind back R -), L no pain,  reaching behind neck  [place] Adult PT Treatment/Exercise - 01/20/19 1132      Therapeutic Activites    Therapeutic Activities  --   sleeping position to minimzie R arm pain   ADL's  education on nervous system, changes post breast CA to pelvic floor tissue, observe for swelling changes in arms and seek lympheedmea therapy , anatomy / physiology withi mages provided in education       Neuro Re-ed    Neuro Re-ed Details   reviewed relaxation       Manual Therapy   Manual therapy comments  Gentle light fascial releases 2 inch distal from suprapubic scar, with MWM ( lower trunk rotation)                PT Short Term Goals - 12/31/18 1543      PT SHORT TERM GOAL #2   Title  lumbar ROM improved by 25% due to increased tissue and spine mobility    Time  4    Period  Weeks    Status  Achieved        PT Long Term Goals - 12/10/18 1654      PT LONG TERM GOAL #1    Title  independent with HEP and understand how to progress herself    Baseline  not educated yet    Time  4    Period  Months    Status  New    Target Date  04/11/19      PT LONG TERM GOAL #2   Title  ability to expand the abdomen due to improve lower rib mobility and tissue mobility to improve continence    Baseline  not able to expand abdomen and many trigger points in the abdomen    Time  4    Period  Months    Status  New    Target Date  04/11/19      PT LONG TERM GOAL #3   Title  pelvic floor strength >/= 3/5 holding for 10 seconds due to endurance and improved lengthening of the pelvic floor    Time  4    Period  Months    Status  New    Target Date  04/11/19      PT LONG TERM GOAL #4   Title  lumbar and lower abdominal pain decreased </= 1/10 50% of the time due to increased activity     Baseline  patient sits all day due to pain, pain level 2/10    Time  4    Period  Months    Status  New    Target Date  04/11/19            Plan - 01/20/19 1146    Clinical Impression Statement  Pt demo'd increased spinal mobility, more relaxed demeanor, less mm tensions in body which signifies good carry over from last session.  Performed gentle fascial release over suprapubic scar, 2 in distal to scar with movement with mobilization. Plan to progress with scar releases and initiate deep core strengthening at next session. Addressed pt's report of R shoulder pain with quick assessment and pt demo'd simulated sleeping position which showed shoulder internal rotation in R sidelying. Provided alternatives to sleeping opsition to minimzie pressure/ strain over R shoulder.  Musculoskeletal assessment did not reveal positive findings. Continue to consider lymphedema related changes to Tx 2/2/ Breast CA and educated pt to continue monitoring changes to arms.   Continued to review mindfulness training ( body scanning)  and biopsychosocial / evidence-based approaches to better management of  pain.   Pt continues to benefit from skilled PT    Personal Factors and Comorbidities  Comorbidity 1;Comorbidity 2    Comorbidities  breast cancer with radiation, chemotherapy and presently taking Tomaxifin; Sjorgens syndrome    Examination-Activity Limitations  Locomotion Level;Continence    Examination-Participation Restrictions  Community Activity;Driving;Cleaning;Shop    Stability/Clinical Decision Making  Evolving/Moderate complexity    Rehab Potential  Excellent    PT Frequency  1x / week    PT Duration  Other (comment)   4 months   PT Treatment/Interventions  Biofeedback;Functional mobility training;Neuromuscular re-education;Therapeutic exercise;Therapeutic activities;Patient/family education;Manual techniques;Dry needling;Scar mobilization    PT Next Visit Plan  transfer to [place] [facility] pelvic floor due to closer to home, work on pelvic floor contraction, abdominal and c-section scar massage; abdominal strength    PT Home Exercise Plan  Access Code: [email or web address]    Consulted and Agree with Plan of Care  Patient       Patient will benefit from skilled therapeutic intervention in order to improve the following deficits and impairments:  Increased fascial restricitons, Pain, Decreased coordination, Decreased mobility, Decreased scar mobility, Increased muscle spasms, Decreased activity tolerance, Decreased range of motion, Decreased strength  Visit Diagnosis: Acute bilateral low back pain without sciatica  Muscle weakness (generalized)  Urinary, incontinence, stress female  Lower abdominal pain     Problem List Patient Active Problem List   Diagnosis Date Noted  . Facial flushing 10/21/2018  . Tinnitus of both ears 10/21/2018  . Drug-induced neutropenia ([place]) 10/20/2018  . 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 Thrombocytosis ([place]) 10/20/2018  . 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 Irregular menstrual cycle 03/02/2018   . Screening for cervical cancer 03/02/2018  . Long-term current use of tamoxifen 02/17/2018  . 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 Anxiety 10/03/2017  . Bilateral primary osteoarthritis of knee 10/03/2017  . 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 Malignant neoplasm of upper-inner quadrant of left breast in female, estrogen receptor positive ([place]) 05/22/2017  . Hirsutism 11/24/2015  . Hyperlipidemia 02/01/2014  . Impaired glucose tolerance 02/01/2014  . Vitamin D deficiency 02/01/2014  . Family history of diabetes mellitus 01/31/2014  . Morbid obesity with BMI of 40.0-44.9, adult ([place]) 01/31/2014  . Sjogren's syndrome ([place]) 01/17/2014    Jerl Mina ,PT, DPT, E-RYT  01/20/2019, 11:54 AM  [place] MAIN [facility] SERVICES [address] [place], [place], [address] Phone: [phone]   Fax:  [phone]  Name: Sheena Martinez MRN: [id number] Date of Birth: 12/24/1960

## 2019-01-20 NOTE — Patient Instructions (Signed)
Sleeping on side with shoulder back more and nor rolled ontop of it Sleeping with slight 3/4 turn onto pillow behind back and pillow between knees and beneath bottom knee   __  Continues with open book and 3- point tap and body scan   __ Look over the education materials

## 2019-01-27 ENCOUNTER — Encounter: Payer: Medicaid Other | Admitting: Physical Therapy

## 2019-01-27 DIAGNOSIS — H40052 Ocular hypertension, left eye: Secondary | ICD-10-CM | POA: Diagnosis not present

## 2019-01-27 DIAGNOSIS — Z947 Corneal transplant status: Secondary | ICD-10-CM | POA: Diagnosis not present

## 2019-01-27 DIAGNOSIS — H40003 Preglaucoma, unspecified, bilateral: Secondary | ICD-10-CM | POA: Diagnosis not present

## 2019-01-28 ENCOUNTER — Ambulatory Visit: Payer: Medicaid Other | Admitting: Physical Therapy

## 2019-01-28 ENCOUNTER — Encounter: Payer: Self-pay | Admitting: Family Medicine

## 2019-01-28 ENCOUNTER — Other Ambulatory Visit: Payer: Self-pay

## 2019-01-28 ENCOUNTER — Ambulatory Visit (INDEPENDENT_AMBULATORY_CARE_PROVIDER_SITE_OTHER): Payer: Medicaid Other | Admitting: Family Medicine

## 2019-01-28 DIAGNOSIS — M170 Bilateral primary osteoarthritis of knee: Secondary | ICD-10-CM | POA: Diagnosis not present

## 2019-01-28 DIAGNOSIS — L680 Hirsutism: Secondary | ICD-10-CM

## 2019-01-28 DIAGNOSIS — E785 Hyperlipidemia, unspecified: Secondary | ICD-10-CM | POA: Diagnosis not present

## 2019-01-28 DIAGNOSIS — H9313 Tinnitus, bilateral: Secondary | ICD-10-CM | POA: Diagnosis not present

## 2019-01-28 DIAGNOSIS — Z79810 Long term (current) use of selective estrogen receptor modulators (SERMs): Secondary | ICD-10-CM

## 2019-01-28 DIAGNOSIS — Z170 Estrogen receptor positive status [ER+]: Secondary | ICD-10-CM

## 2019-01-28 DIAGNOSIS — M545 Low back pain, unspecified: Secondary | ICD-10-CM

## 2019-01-28 DIAGNOSIS — M3500 Sicca syndrome, unspecified: Secondary | ICD-10-CM

## 2019-01-28 DIAGNOSIS — R232 Flushing: Secondary | ICD-10-CM

## 2019-01-28 DIAGNOSIS — C50212 Malignant neoplasm of upper-inner quadrant of left female breast: Secondary | ICD-10-CM | POA: Diagnosis not present

## 2019-01-28 DIAGNOSIS — F419 Anxiety disorder, unspecified: Secondary | ICD-10-CM

## 2019-01-28 DIAGNOSIS — Z947 Corneal transplant status: Secondary | ICD-10-CM

## 2019-01-28 DIAGNOSIS — Z6841 Body Mass Index (BMI) 40.0 and over, adult: Secondary | ICD-10-CM

## 2019-01-28 DIAGNOSIS — E0520 Thyrotoxicosis with toxic multinodular goiter without thyrotoxic crisis or storm: Secondary | ICD-10-CM

## 2019-01-28 DIAGNOSIS — R1030 Lower abdominal pain, unspecified: Secondary | ICD-10-CM | POA: Diagnosis not present

## 2019-01-28 DIAGNOSIS — M6281 Muscle weakness (generalized): Secondary | ICD-10-CM | POA: Diagnosis not present

## 2019-01-28 DIAGNOSIS — N393 Stress incontinence (female) (male): Secondary | ICD-10-CM | POA: Diagnosis not present

## 2019-01-28 DIAGNOSIS — E559 Vitamin D deficiency, unspecified: Secondary | ICD-10-CM

## 2019-01-28 NOTE — Therapy (Signed)
[place] MAIN [facility] SERVICES [address] [place], [place], [address] Phone: [phone]   Fax:  [phone]  Physical Therapy Treatment  Patient Details  Name: Sheena Martinez MRN: [id number] Date of Birth: 1960-11-18 Referring Provider (PT): Dr. Jed Limerick, NP   Encounter Date: 01/28/2019  PT End of Session - 01/28/19 2123    Visit Number  7    Date for PT Re-Evaluation  04/11/19    Authorization Type  Medicaid 04/07/19    Authorization - Visit Number  3    PT Start Time  1150    PT Stop Time  1250    PT Time Calculation (min)  60 min    Activity Tolerance  Patient tolerated treatment well;No increased pain    Behavior During Therapy  WFL for tasks assessed/performed       Past Medical History:  Diagnosis Date  . Anxiety   . Arthritis   . 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 Corneal perforation of left eye 10/09/2017   Overview:  Added automatically from request for surgery [id number]  . GERD (gastroesophageal reflux disease)    OCC  . Goiter   . Hematuria 10/21/2018  . Hyperthyroidism   . Personal history of radiation therapy   . Sjogren's syndrome [facility])     Past Surgical History:  Procedure Laterality Date  . BREAST BIOPSY Left 05/12/2017   [place] bx/ INVASIVE MAMMARY CARCINOMA  . BREAST LUMPECTOMY Left 05/2017   invasive mammary carcinoma, neg margins  . BREAST LUMPECTOMY WITH SENTINEL LYMPH NODE BIOPSY Left 06/10/2017   Procedure: BREAST LUMPECTOMY WITH SENTINEL LYMPH NODE BX AND NEEDLE LOCALIZATION;  Surgeon: Robert Bellow, MD;  Location: [facility] ORS;  Service: General;  Laterality: Left;  . BREAST MAMMOSITE Left 06/24/2017   Procedure: MAMMOSITE BREAST;  Surgeon: Robert Bellow, MD;  Location: [facility] ORS;  Service: General;  Laterality: Left;  . CESAREAN SECTION  1995  . 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 COSMETIC SURGERY    . CYST EXCISION   2018   pilar cyst/ Dr Will Bonnet  . EYE SURGERY    . PORTACATH PLACEMENT Right 07/08/2017   Procedure: INSERTION PORT-A-CATH;  Surgeon: Robert Bellow, MD;  Location: [facility] ORS;  Service: General;  Laterality: Right;  . TONSILLECTOMY      There were no vitals filed for this visit.  Subjective Assessment - 01/28/19 1150    Subjective  Pt felt last session's manual Tx helped to decrease the pelvic pain and was relieved for one week until she slept in the bed which she typically does not sleep in. Pt is planning to go to the beach in one week and she tends to feel sore after PT sessions and woudl like to not be sore. Pt will be sleeping in a bed at the beach. She had been sleeping in her recliner while waiting for her pillow to arrive. When she tries to sleep in a bed, she feels back and pelvic pain.  The last 3 nights at 3am , her faces gets flushed and is an anxiety Sx. This Sx is new and she is communicated with her provider. Pt has not been tested for OSA, and she states she snores. Pt has chosen recliner for comfort to decrease pain and to not be woken up by husband's snoring. .    Patient Stated Goals  reduce pain in  back, pelvic pain and redue urinary leakage    Pain Onset  1 to 4 weeks ago         [facility] PT Assessment - 01/28/19 2125      Coordination   Gross Motor Movements are Fluid and Coordinated  --   improved diaphragmatic excursion   Fine Motor Movements are Fluid and Coordinated  --   tactile/ verbal cues for pelvic tilts                Pelvic Floor Special Questions - 01/28/19 2126    External Palpation  through clothing: atnerior triangle mm tightness/ tenderness B         [facility] Adult PT Treatment/Exercise - 01/28/19 2116      Therapeutic Activites    ADL's  discussed pillow positioning, sleep hygiene, motivational interviewing to create strategies for compliance to HEP , co-created strategies, discussed referral for sleep study to rule in/out OSA        Neuro Re-ed    Neuro Re-ed Details   cued for pelvic tilts ( tectile/ visual/verbal)       Manual Therapy   Manual therapy comments  inferior mob at scap, STM/MWM upper trap/scalenes/levator scapulae B , distraction at occiput , lower cerivcal to decrease forward head posture                PT Short Term Goals - 12/31/18 1543      PT SHORT TERM GOAL #2   Title  lumbar ROM improved by 25% due to increased tissue and spine mobility    Time  4    Period  Weeks    Status  Achieved        PT Long Term Goals - 12/10/18 1654      PT LONG TERM GOAL #1   Title  independent with HEP and understand how to progress herself    Baseline  not educated yet    Time  4    Period  Months    Status  New    Target Date  04/11/19      PT LONG TERM GOAL #2   Title  ability to expand the abdomen due to improve lower rib mobility and tissue mobility to improve continence    Baseline  not able to expand abdomen and many trigger points in the abdomen    Time  4    Period  Months    Status  New    Target Date  04/11/19      PT LONG TERM GOAL #3   Title  pelvic floor strength >/= 3/5 holding for 10 seconds due to endurance and improved lengthening of the pelvic floor    Time  4    Period  Months    Status  New    Target Date  04/11/19      PT LONG TERM GOAL #4   Title  lumbar and lower abdominal pain decreased </= 1/10 50% of the time due to increased activity     Baseline  patient sits all day due to pain, pain level 2/10    Time  4    Period  Months    Status  New    Target Date  04/11/19            Plan - 01/28/19 2123    Clinical Impression Statement  Pt is progressing well towards her goals as she showed good carry over with diaphragmatic excursion. However, pt required  external manual Tx to release anterior mm of pelvic floor and neuromuscular reeducation for pelvic tilts to optimize pelvic floor lengthening. Also required manual Tx to minimize forward head posture by  decreasing upper trap/ scalenes/levator scapulae mm B. Discussed sleep hygiene, pillow placement to minimize forward head. Discussed referral to PCP for sleep study 2/2 report of snoring and difficulty sleeping. Husband also snores which keeps her from sleeping. Currently pt sleeps in her recliner for less pain and quietness.  Pt is going to the beach next week and will have to sleep in a bed. Provided strategies to help pt adjust to sleeping on her bed and to be comfortable on her back with propped pillows behind back. Educated in discontinuing use of neck pillow which places her in forward head posture. Educated the importance of sidelying/ on back and avoiding belly sleeping. Pt continues to benefit from skilled PT.   Plan to also refer pt to lymphedema therapist due to her report of leg swelling.      Personal Factors and Comorbidities  Comorbidity 1;Comorbidity 2    Comorbidities  breast cancer with radiation, chemotherapy and presently taking Tomaxifin; Sjorgens syndrome    Examination-Activity Limitations  Locomotion Level;Continence    Examination-Participation Restrictions  Community Activity;Driving;Cleaning;Shop    Stability/Clinical Decision Making  Evolving/Moderate complexity    Rehab Potential  Excellent    PT Frequency  1x / week    PT Duration  Other (comment)   4 months   PT Treatment/Interventions  Biofeedback;Functional mobility training;Neuromuscular re-education;Therapeutic exercise;Therapeutic activities;Patient/family education;Manual techniques;Dry needling;Scar mobilization    PT Next Visit Plan  transfer to [place] PT pelvic floor due to closer to home, work on pelvic floor contraction, abdominal and c-section scar massage; abdominal strength    PT Home Exercise Plan  Access Code: [email or web address]    Consulted and Agree with Plan of Care  Patient       Patient will benefit from skilled therapeutic intervention in order to improve the following deficits and impairments:   Increased fascial restricitons, Pain, Decreased coordination, Decreased mobility, Decreased scar mobility, Increased muscle spasms, Decreased activity tolerance, Decreased range of motion, Decreased strength  Visit Diagnosis: 1. Acute bilateral low back pain without sciatica   2. Muscle weakness (generalized)   3. Urinary, incontinence, stress female   4. Lower abdominal pain        Problem List Patient Active Problem List   Diagnosis Date Noted  . Facial flushing 10/21/2018  . Tinnitus of both ears 10/21/2018  . Drug-induced neutropenia ([place]) 10/20/2018  . 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 Thrombocytosis ([place]) 10/20/2018  . 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 Irregular menstrual cycle 03/02/2018  . Screening for cervical cancer 03/02/2018  . Long-term current use of tamoxifen 02/17/2018  . 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 Anxiety 10/03/2017  . Bilateral primary osteoarthritis of knee 10/03/2017  . 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 Malignant neoplasm of upper-inner quadrant of left breast in female, estrogen receptor positive ([place]) 05/22/2017  . Hirsutism 11/24/2015  . Hyperlipidemia 02/01/2014  . Impaired glucose tolerance 02/01/2014  . Vitamin D deficiency 02/01/2014  . Family history of diabetes mellitus 01/31/2014  . Morbid obesity with BMI of 40.0-44.9, adult ([place]) 01/31/2014  . Sjogren's syndrome ([place]) 01/17/2014    Jerl Mina ,PT, DPT, E-RYT  01/28/2019, 9:29 PM  [place] MAIN [facility] SERVICES [address] [place], [place], [address] Phone: [phone]   Fax:  [phone]  Name: Sheena Martinez MRN: [id number] Date of Birth: 02/18/1961

## 2019-01-28 NOTE — Patient Instructions (Addendum)
Sleep hygiene:  ( emailed presentation)   _________  Sheena Martinez from TV and phone screen 2 hours before bed.  Body scan and relaxing music   Lying on your back, pillow under the knees, top pillow lengthwise above two pillow placed horizontally  Create signs of comfort to avoid belly sleeping, small throw square pillows and hug under both arms. Pressure of them against front body is way to train the nervous system to relax and still feel the front body is comforted.    ________  Routine:  Upon waking, empty bladder Recliner exercises with "sushi roll" of towel under neck ( discontinue using neck pillow):   Loosening joints , open book, angel wings    Later in the morning when your energy is up: 3-point tap  Then midafternoon /  Before bed: relaxing and stretches in bed to get acclimated to bed to prepare you to sleep in a bed on your beach trip    __________

## 2019-01-28 NOTE — Progress Notes (Signed)
Name: Sheena Martinez   MRN: [id number]    DOB: 1960-11-09   Date:01/28/2019       Progress Note  Subjective  Chief Complaint  Chief Complaint  Patient presents with  . Follow-up    I connected with  Sheena Martinez  on 01/28/19 at  1:40 PM EDT by a video enabled telemedicine application and verified that I am speaking with the correct person using two identifiers.  I discussed the limitations of evaluation and management by telemedicine and the availability of in person appointments. The patient expressed understanding and agreed to proceed. Staff also discussed with the patient that there may be a patient responsible charge related to this service. Patient Location: Home Provider Location: Office Additional Individuals present: None  HPI  Flushing: Saw Sheena Martinez for facial flushing after cortisone injection on 07/24/2018.  She also had allergic reaction to Retuxan on 10/16/2018 and woke the next day with facial flushing that lasted most of the day.  She notes that it is occurring occasionally and was told by her psychiatrist that this is an anxiety manifestation.  The patient agrees that this is likely secondary to anxiety - recommend counseling.   Sjogren's Syndrome: Seeing rheumatologyon routine basis - last appt is late March 2020; unclear if this was the cause of her corneal perforation. She is not on DMARD at this time, though it has been discussed with rheumatology.  When she came off of Prednisone, she started having tinnitus and joint pain that has been ongoing.  She will talk with her Rheumatologist in her upcoming appt about arthralgias.  Tinnitus: Saw radiation oncologist and told him and they told her to stop tomoxifen x2 weeks to see if the ringing stops - went back on because it didn't help.  Also tried off of Buspar and this didn't help. Saw Martinez and was told there was nothing she could do about it; had recent second opinion from Sheena Martinez who gave similar answers. No S/I,  but does feel like it affects her mood at times.  She is learning techniques to mask.  Vitamin D Deficiency: Is due for DEXA scan - will call soon to reschedule. Vitamin D was low at 24 - taking 2000IU x90 days, then will go back down to 1000IU daily (due to decrease in the next 1-2 weeks). She is also taking vitamin K twice weekly.  Stable and unchanged  Right Knee pain and Arthralgias:She noticed RIGHT knee pain flared back up after she stopped her prednisone. 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  Also found to have bilateral hip and lower back degenerative changes on xray in February 2020.  She is seeing a physical therapist.Saw orthopedist yesterday - Dr. Rudene Christians - had Xray of her right knee and has a veryabnormal Xray, was told she will have to follow up when she is ready for surgery.  RLE swelling: She notes has had RLE edema for many years, even before cancer treatment. She states the right ankle aches quite a bit. She will elevate at night and apply icy hot and this helps temporarily, swelling goes down, but the pain and swelling return the next day. She is seeing PT and has lymphedema specialist referral in place.   Anxiety: She is seeing psychiatry with [facility] - Dr. Register and is doing well on current medication regimen. She was referred for counseling but does not have  the time right now. Unchanged/stable.  Breast Cancer - LEFT BREAST:Followingup with Dr. Tasia Martinez and Dr. Donella Martinez - has been of Tamoxifen as above. Recent mammogram in September 2019 with Dr. Bary Castilla was normalShe had LEFT lumpectomy in October 2018, had radiation and chemo therapy. Unchanged, follow up in September 2020.  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 She is using prednisone  drops and brimonidine drops, and has had sutures removed/have come out on their own.She had refraction performed yesterday with her eye specialist and was told that her vision is likely to not change.  She was also found to have cataracts yesterday - cannot have these removed yet as this risks inflammation - she is having to switch ophthalmologists due to her current one leaving.  Abnormal Thyroid Test: She has hxabnormal thyroid testing in the past ; TSH on 10/15/17 was 0.01-she is seeing endocrinology now for management and taking Tapazole - they discussed radioactive medication for ablation, but they chose to wait until her cornea and ulcerative keratitis has healed and her infusions are completed for her cancer treatment.She denies abnormal fatigue; she does have hairloss (is s/p chemo), denies palpitations, chest pain, shortness of breath; endorses ongoing anxiety as above.Hot flashes as above.   Hirsutism: Has history elevated testosterone and PCOS in the past, used to receive care from endocrinology for this, however she lost follow up when she lost insurance several years ago.She was referred to endocrinology for this and is seeing them routinely. Unchaged, stable.  Elevated LDL: ASCVD 2.4% risk; no need for statin at this time, lifestyle modifications discussed in detail. Will continue to monitor periodically.  No chest pain or shortness of breath, no palpitations.  Obesity:Her weight is stable., She is joining a women's only gym for seniors with her friend, Sheena Martinez. This gym also offers nutrition services which she will be using. We discussed lifestyle modifications in detail.She did speak with a GYN regarding phentermine, but decided to not take the medication which I do agree with at this time.  Encouraged intense dietary changes.  Patient Active Problem List   Diagnosis Date Noted  . Facial flushing 10/21/2018  . Tinnitus of both ears 10/21/2018  . Drug-induced neutropenia  ([place]) 10/20/2018  . 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 Thrombocytosis ([place]) 10/20/2018  . 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 Irregular menstrual cycle 03/02/2018  . Screening for cervical cancer 03/02/2018  . Long-term current use of tamoxifen 02/17/2018  . 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 Anxiety 10/03/2017  . Bilateral primary osteoarthritis of knee 10/03/2017  . 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 Malignant neoplasm of upper-inner quadrant of left breast in female, estrogen receptor positive ([place]) 05/22/2017  . Hirsutism 11/24/2015  . Hyperlipidemia 02/01/2014  . Impaired glucose tolerance 02/01/2014  . Vitamin D deficiency 02/01/2014  . Family history of diabetes mellitus 01/31/2014  . Morbid obesity with BMI of 40.0-44.9, adult ([place]) 01/31/2014  . Sjogren's syndrome ([place]) 01/17/2014    Past Surgical History:  Procedure Laterality Date  . BREAST BIOPSY Left 05/12/2017   [place] bx/ INVASIVE MAMMARY CARCINOMA  . BREAST LUMPECTOMY Left 05/2017   invasive mammary carcinoma, neg margins  . BREAST LUMPECTOMY WITH SENTINEL LYMPH NODE BIOPSY Left 06/10/2017   Procedure: BREAST LUMPECTOMY WITH SENTINEL LYMPH NODE BX AND NEEDLE LOCALIZATION;  Surgeon: Robert Bellow, MD;  Location: [facility] ORS;  Service: General;  Laterality: Left;  . BREAST MAMMOSITE Left 06/24/2017   Procedure: MAMMOSITE BREAST;  Surgeon: Robert Bellow, MD;  Location: [facility] ORS;  Service: General;  Laterality: Left;  . CESAREAN SECTION  1995  . 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 COSMETIC SURGERY    . CYST EXCISION  2018   pilar cyst/ Dr Will Bonnet  . EYE SURGERY    . PORTACATH PLACEMENT Right 07/08/2017   Procedure: INSERTION PORT-A-CATH;  Surgeon: Robert Bellow, MD;  Location: [facility] ORS;  Service: General;  Laterality: Right;  . TONSILLECTOMY      Family History  Problem Relation Age of Onset  . Breast cancer Maternal Aunt 87        currently ~65  . Diabetes Mother   . Skin cancer Mother        currently 30; TAH/BSO (age?)  . Anemia Mother   . Liver disease Father        deceased / not much info about him / alcoholic  . Lung cancer Paternal Aunt        smoker / deceased 3s  . Stomach cancer Paternal Uncle 29       deceased / deceased 14s  . Ovarian cancer Maternal Grandmother 33       deceased 30s  . Stomach cancer Paternal Grandfather 32       deceased 33s  . Cancer Maternal Uncle        unk. type; deceased 45s  . Leukemia Cousin 53       deceased 39    Social History   Socioeconomic History  . Marital status: Married    Spouse name: Eulas Post  . Number of children: 3  . Years of education: Not on file  . Highest education level: GED or equivalent  Occupational History  . Occupation: unemployed  Social Needs  . Financial resource strain: Not on file  . Food insecurity    Worry: Never true    Inability: Never true  . Transportation needs    Medical: No    Non-medical: No  Tobacco Use  . Smoking status: Former Smoker    Years: 7.00    Types: Cigarettes    Quit date: 08/13/1979    Years since quitting: 39.4  . Smokeless tobacco: Never Used  . Tobacco comment: AGE [age]  Substance and Sexual Activity  . Alcohol use: No  . Drug use: No  . Sexual activity: Yes    Partners: Male  Lifestyle  . Physical activity    Days per week: 0 days    Minutes per session: 0 min  . Stress: Very much  Relationships  . Social connections    Talks on phone: More than three times a week    Gets together: More than three times a week    Attends religious service: More than 4 times per year    Active member of club or organization: Yes    Attends meetings of clubs or organizations: More than 4 times per year    Relationship status: Married  . Intimate partner violence    Fear of current or ex partner: No    Emotionally abused: No    Physically abused: No    Forced sexual activity: No  Other Topics  Concern  . Not on file  Social History Narrative  . Not on file     Current Outpatient Medications:  .  acetaminophen (TYLENOL) 500 MG tablet, Take 1,000 mg by mouth every 8 (  eight) hours as needed for mild pain or headache., Disp: , Rfl:  .  brimonidine (ALPHAGAN) 0.2 % ophthalmic solution, INSTILL 1 DROP INTO LEFT EYE THREE TIMES DAILY, Disp: , Rfl: 12 .  Ca Carbonate-Mag Hydroxide (ROLAIDS PO), Take 1 tablet as needed by mouth (indigestion). , Disp: , Rfl:  .  cholecalciferol (VITAMIN D) 1000 units tablet, Take 2,000 Units by mouth daily., Disp: , Rfl:  .  ketoconazole (NIZORAL) 2 % shampoo, Apply 1 application topically 2 (two) times a week., Disp: 120 mL, Rfl: 3 .  LORazepam (ATIVAN) 0.5 MG tablet, , Disp: , Rfl: 0 .  meloxicam (MOBIC) 15 MG tablet, Take 7.5 mg by mouth daily., Disp: , Rfl:  .  methimazole (TAPAZOLE) 10 MG tablet, TAKE 1 TABLET BY MOUTH ONCE DAILY AT 6AM, Disp: , Rfl: 2 .  prednisoLONE acetate (PRED FORTE) 1 % ophthalmic suspension, , Disp: , Rfl: 1 .  sertraline (ZOLOFT) 100 MG tablet, Take 150 mg by mouth daily., Disp: , Rfl: 1 .  tamoxifen (NOLVADEX) 20 MG tablet, Take 1 tablet (20 mg total) by mouth daily., Disp: 30 tablet, Rfl: 1 .  Timolol Maleate 0.5 % (DAILY) SOLN, Place 1 drop into the left eye 2 (two) times daily., Disp: , Rfl:  .  tizanidine (ZANAFLEX) 2 MG capsule, Take 1 capsule (2 mg total) by mouth 3 (three) times daily., Disp: 30 capsule, Rfl: 1 .  vitamin k 100 MCG tablet, Take 100 mcg by mouth 2 (two) times a week., Disp: , Rfl:  .  carboxymethylcellulose 1 % ophthalmic solution, Place 1 drop into the left eye 4 (four) times daily., Disp: , Rfl:  .  cycloSPORINE (RESTASIS) 0.05 % ophthalmic emulsion, INSTILL 1 DROP INTO EACH EYE TWICE DAILY, Disp: , Rfl:  .  ketorolac (TORADOL) 10 MG tablet, Take 1 tablet (10 mg total) by mouth every 8 (eight) hours as needed for severe pain. (Patient not taking: Reported on 12/23/2018), Disp: 20 tablet, Rfl: 0 .   ondansetron (ZOFRAN) 4 MG tablet, Take 1 tablet (4 mg total) by mouth every 8 (eight) hours as needed. (Patient not taking: Reported on 12/23/2018), Disp: 20 tablet, Rfl: 0 .  oxybutynin (DITROPAN XL) 10 MG 24 hr tablet, Take 10 mg by mouth at bedtime., Disp: , Rfl:  .  VESICARE 5 MG tablet, Take 5 mg by mouth daily., Disp: , Rfl:  .  vitamin C (ASCORBIC ACID) 500 MG tablet, Take 500 mg by mouth daily., Disp: , Rfl:   Allergies  Allergen Reactions  . Rituximab Anaphylaxis  . Benadryl [Diphenhydramine] Other (See Comments)    Patient felt like she was going to "crawl out of her skin"  . Diphenhydramine Hcl   . Clonazepam Anxiety  . Sulfamethoxazole-Trimethoprim Rash    Chest tightness. Bactrim    I personally reviewed active problem list, medication list, allergies, notes from last encounter, lab results with the patient/caregiver today.   ROS  Constitutional: Negative for fever or weight change.  Respiratory: Negative for cough and shortness of breath.   Cardiovascular: Negative for chest pain or palpitations.  Gastrointestinal: Negative for abdominal pain, no bowel changes.  Musculoskeletal: Negative for gait problem or joint swelling.  Skin: Negative for rash.  Neurological: Negative for dizziness or headache.  No other specific complaints in a complete review of systems (except as listed in HPI above).  Objective  Virtual encounter, vitals not obtained.  There is no height or weight on file to calculate BMI.  Physical Exam  Pulmonary/Chest: Effort normal. No respiratory distress. Speaking in complete sentences Neurological: Pt is alert and oriented to person, place, and time. Coordination, speech and gait are normal.  Psychiatric: Patient has a normal mood and affect. behavior is normal. Judgment and thought content normal.  No results found for this or any previous visit (from the past 72 hour(s)).  PHQ2/9: Depression screen [facility] 2/9 01/28/2019 11/30/2018 10/20/2018  09/16/2018 07/24/2018  Decreased Interest 1 0 1 1 0  Down, Depressed, Hopeless 1 1 1 1 1   PHQ - 2 Score 2 1 2 2 1   Altered sleeping 0 0 0 1 0  Tired, decreased energy 1 0 3 3 3   Change in appetite 0 0 3 0 3  Feeling bad or failure about yourself  0 0 0 0 0  Trouble concentrating 0 0 0 1 0  Moving slowly or fidgety/restless 0 0 0 0 0  Suicidal thoughts 0 0 0 0 0  PHQ-9 Score 3 1 8 7 7   Difficult doing work/chores Not difficult at all - Somewhat difficult Somewhat difficult Not difficult at all  Some recent data might be hidden   PHQ-2/9 Result is negative.    Fall Risk: Fall Risk  01/28/2019 11/30/2018 10/20/2018 09/16/2018 07/24/2018  Falls in the past year? 0 0 0 0 0  Number falls in past yr: 0 0 0 0 0  Injury with Fall? 0 0 0 0 0  Follow up - - - Falls evaluation completed -    Assessment & Plan  1. Sjogren's syndrome, with unspecified organ involvement ([place]) Continue with Rheumatology  2. Flushing reaction Will start with counseling for anxiety, and continue with psychiatrist, however I did advise she let me know if things are worsening or do not improve.  3. Tinnitus of both ears - Unfortunately, this is unchanged.  4. Vitamin D deficiency - Taking supplementation  5. Bilateral primary osteoarthritis of knee - Doing well on NSAID.  6. Anxiety - Seeing psychiatry, needs to follow up on counseling  7. Malignant neoplasm of upper-inner quadrant of left breast in female, estrogen receptor positive ([place]) Taking tomoxifen, following up with [place]  8. Long-term current use of tamoxifen - Tolerating well.  9. History of cornea transplant - Seeing specialty, is continuing drops and ongoing follow up.    10. Hyperlipidemia, unspecified hyperlipidemia type - Lipid panel  11. Toxic multinodular goiter - Ambulatory referral to Endocrinology  12. Hirsutism - Doing well  13. Morbid obesity with BMI of 40.0-44.9, adult ([place]) - Discussed importance of 150 minutes  of physical activity weekly, eat two servings of fish weekly, eat one serving of tree nuts ( cashews, pistachios, pecans, almonds.Marland Kitchen) every other day, eat 6 servings of fruit/vegetables daily and drink plenty of water and avoid sweet beverages.    I discussed the assessment and treatment plan with the patient. The patient was provided an opportunity to ask questions and all were answered. The patient agreed with the plan and demonstrated an understanding of the instructions.  The patient was advised to call back or seek an in-person evaluation if the symptoms worsen or if the condition fails to improve as anticipated.  I provided 31 minutes of non-face-to-face time during this encounter.

## 2019-01-30 ENCOUNTER — Other Ambulatory Visit: Payer: Self-pay | Admitting: Family Medicine

## 2019-01-30 DIAGNOSIS — M170 Bilateral primary osteoarthritis of knee: Secondary | ICD-10-CM

## 2019-02-01 ENCOUNTER — Other Ambulatory Visit: Payer: Self-pay

## 2019-02-01 ENCOUNTER — Inpatient Hospital Stay: Payer: Medicaid Other | Attending: Oncology

## 2019-02-01 DIAGNOSIS — C50212 Malignant neoplasm of upper-inner quadrant of left female breast: Secondary | ICD-10-CM | POA: Insufficient documentation

## 2019-02-01 DIAGNOSIS — Z170 Estrogen receptor positive status [ER+]: Secondary | ICD-10-CM | POA: Insufficient documentation

## 2019-02-01 DIAGNOSIS — Z79810 Long term (current) use of selective estrogen receptor modulators (SERMs): Secondary | ICD-10-CM | POA: Insufficient documentation

## 2019-02-01 DIAGNOSIS — Z452 Encounter for adjustment and management of vascular access device: Secondary | ICD-10-CM | POA: Diagnosis not present

## 2019-02-01 DIAGNOSIS — Z95828 Presence of other vascular implants and grafts: Secondary | ICD-10-CM

## 2019-02-01 MED ORDER — HEPARIN SOD (PORK) LOCK FLUSH 100 UNIT/ML IV SOLN
500.0000 [IU] | Freq: Once | INTRAVENOUS | Status: AC
  Administered 2019-02-01: 500 [IU] via INTRAVENOUS

## 2019-02-01 MED ORDER — SODIUM CHLORIDE 0.9% FLUSH
10.0000 mL | Freq: Once | INTRAVENOUS | Status: AC
  Administered 2019-02-01: 15:00:00 10 mL via INTRAVENOUS
  Filled 2019-02-01: qty 10

## 2019-02-02 ENCOUNTER — Ambulatory Visit: Payer: Medicaid Other | Admitting: Physical Therapy

## 2019-02-02 DIAGNOSIS — M545 Low back pain, unspecified: Secondary | ICD-10-CM

## 2019-02-02 DIAGNOSIS — M6281 Muscle weakness (generalized): Secondary | ICD-10-CM | POA: Diagnosis not present

## 2019-02-02 DIAGNOSIS — N393 Stress incontinence (female) (male): Secondary | ICD-10-CM | POA: Diagnosis not present

## 2019-02-02 DIAGNOSIS — R1030 Lower abdominal pain, unspecified: Secondary | ICD-10-CM

## 2019-02-02 NOTE — Patient Instructions (Signed)
Deep core level 1 and 2 ( handout) 

## 2019-02-02 NOTE — Therapy (Addendum)
[place] MAIN [facility] SERVICES [address] [place], [place], [address] Phone: [phone]   Fax:  [phone]  Physical Therapy Treatment  Patient Details  Name: Sheena Martinez MRN: [id number] Date of Birth: 05/28/1961 Referring Provider (PT): Dr. Jed Limerick, NP   Encounter Date: 02/02/2019  PT End of Session - 02/02/19 1718    Visit Number  8    Date for PT Re-Evaluation  04/11/19    Authorization Type  Medicaid 04/07/19    Authorization - Visit Number  4    PT Start Time  8413    PT Stop Time  1610    PT Time Calculation (min)  55 min    Activity Tolerance  Patient tolerated treatment well;No increased pain    Behavior During Therapy  WFL for tasks assessed/performed       Past Medical History:  Diagnosis Date  . Anxiety   . Arthritis   . 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 Corneal perforation of left eye 10/09/2017   Overview:  Added automatically from request for surgery [id number]  . GERD (gastroesophageal reflux disease)    OCC  . Goiter   . Hematuria 10/21/2018  . Hyperthyroidism   . Personal history of radiation therapy   . Sjogren's syndrome [facility])     Past Surgical History:  Procedure Laterality Date  . BREAST BIOPSY Left 05/12/2017   [place] bx/ INVASIVE MAMMARY CARCINOMA  . BREAST LUMPECTOMY Left 05/2017   invasive mammary carcinoma, neg margins  . BREAST LUMPECTOMY WITH SENTINEL LYMPH NODE BIOPSY Left 06/10/2017   Procedure: BREAST LUMPECTOMY WITH SENTINEL LYMPH NODE BX AND NEEDLE LOCALIZATION;  Surgeon: Robert Bellow, MD;  Location: [facility] ORS;  Service: General;  Laterality: Left;  . BREAST MAMMOSITE Left 06/24/2017   Procedure: MAMMOSITE BREAST;  Surgeon: Robert Bellow, MD;  Location: [facility] ORS;  Service: General;  Laterality: Left;  . CESAREAN SECTION  1995  . 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 COSMETIC SURGERY    . CYST EXCISION   2018   pilar cyst/ Dr Will Bonnet  . EYE SURGERY    . PORTACATH PLACEMENT Right 07/08/2017   Procedure: INSERTION PORT-A-CATH;  Surgeon: Robert Bellow, MD;  Location: [facility] ORS;  Service: General;  Laterality: Right;  . TONSILLECTOMY      There were no vitals filed for this visit.  Subjective Assessment - 02/02/19 1513    Subjective  Pt has changed her family plans to not go to the beach due to COVID measures. Pt felt great after last sesion with the treatment at her neck. Pt slept in her bed for one night instead of her recliner without interruption of pain.  She can tell she gets back pain when something causes her anxiety. Pt 's urinary urge and leakage is better. Her abdominal pain is 80% better.    Patient Stated Goals  reduce pain in back, pelvic pain and redue urinary leakage    Pain Onset  1 to 4 weeks ago         [facility] PT Assessment - 02/02/19 1559      Coordination   Gross Motor Movements are Fluid and Coordinated  --  (Pended)    minor cues for deep core level 2      Palpation   Palpation comment  significantly decreased back and shoulder./neck mm tightness, improved diaphramgatic excursion and less  upper trap overuse  (Pended)                    [facility] Adult PT Treatment/Exercise - 02/02/19 1557      Neuro Re-ed    Neuro Re-ed Details   cued for deep core strengthening level 1 and 2 to minimize pelvic floor and low back dysfunctions      Moist Heat Therapy   Number Minutes Moist Heat  5 Minutes    Moist Heat Location  --   cervical, not billed      Manual Therapy   Manual therapy comments  distraction at occiput, STM/MWM to release levator scapular R to minimize overuse of upper trap, decrease forward posture and optimal postural alignment for pelvic floor and low back function                PT Short Term Goals - 12/31/18 1543      PT SHORT TERM GOAL #2   Title  lumbar ROM improved by 25% due to increased tissue and spine mobility    Time   4    Period  Weeks    Status  Achieved        PT Long Term Goals - 12/10/18 1654      PT LONG TERM GOAL #1   Title  independent with HEP and understand how to progress herself    Baseline  not educated yet    Time  4    Period  Months    Status  New    Target Date  04/11/19      PT LONG TERM GOAL #2   Title  ability to expand the abdomen due to improve lower rib mobility and tissue mobility to improve continence    Baseline  not able to expand abdomen and many trigger points in the abdomen    Time  4    Period  Months    Status  New    Target Date  04/11/19      PT LONG TERM GOAL #3   Title  pelvic floor strength >/= 3/5 holding for 10 seconds due to endurance and improved lengthening of the pelvic floor    Time  4    Period  Months    Status  New    Target Date  04/11/19      PT LONG TERM GOAL #4   Title  lumbar and lower abdominal pain decreased </= 1/10 50% of the time due to increased activity     Baseline  patient sits all day due to pain, pain level 2/10    Time  4    Period  Months    Status  New    Target Date  04/11/19            Plan - 02/02/19 1719    Clinical Impression Statement  Pt 's urnary irge and leakage is better. Her abdominal pain is 80% better.  She can tell she gets back pain when something causes her anxiety but pt is continuing to be compliant with relaxation practices she is learning. Demonstrated today significantly decreased mm tensions along spine but required more manual Tx to decrease levator scapular tightness on R. Pt tolerated without pain. Anticipate as pt demonstrates less overuse of shoulder /neck mm , pt will achieve more upright posture, less forward head, optimal deep core coordination, and increased ability to sleep on her back in her bed instead of her recliner. Progressed  her today with deep core level 1 and 2 which pt demo'd correct technique with minor cues. Pt continues to benefit from skilled PT.  DPT faxed a note to pt's   PCP re: recommendation for sleep study and referral for lymphedema eval and pt has not heard back from their office yet.     Personal Factors and Comorbidities  Comorbidity 1;Comorbidity 2    Comorbidities  breast cancer with radiation, chemotherapy and presently taking Tomaxifin; Sjorgens syndrome    Examination-Activity Limitations  Locomotion Level;Continence    Examination-Participation Restrictions  Community Activity;Driving;Cleaning;Shop    Stability/Clinical Decision Making  Evolving/Moderate complexity    Rehab Potential  Excellent    PT Frequency  1x / week    PT Duration  Other (comment)   4 months   PT Treatment/Interventions  Biofeedback;Functional mobility training;Neuromuscular re-education;Therapeutic exercise;Therapeutic activities;Patient/family education;Manual techniques;Dry needling;Scar mobilization    PT Next Visit Plan  transfer to [place] PT pelvic floor due to closer to home, work on pelvic floor contraction, abdominal and c-section scar massage; abdominal strength    PT Home Exercise Plan  Access Code: [email or web address]    Consulted and Agree with Plan of Care  Patient       Patient will benefit from skilled therapeutic intervention in order to improve the following deficits and impairments:  Increased fascial restricitons, Pain, Decreased coordination, Decreased mobility, Decreased scar mobility, Increased muscle spasms, Decreased activity tolerance, Decreased range of motion, Decreased strength  Visit Diagnosis: 1. Acute bilateral low back pain without sciatica   2. Muscle weakness (generalized)   3. Urinary, incontinence, stress female   4. Lower abdominal pain        Problem List Patient Active Problem List   Diagnosis Date Noted  . Facial flushing 10/21/2018  . Tinnitus of both ears 10/21/2018  . Drug-induced neutropenia ([place]) 10/20/2018  . 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 Thrombocytosis ([place])  10/20/2018  . 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 Irregular menstrual cycle 03/02/2018  . Screening for cervical cancer 03/02/2018  . Long-term current use of tamoxifen 02/17/2018  . 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 Anxiety 10/03/2017  . Bilateral primary osteoarthritis of knee 10/03/2017  . 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 Malignant neoplasm of upper-inner quadrant of left breast in female, estrogen receptor positive ([place]) 05/22/2017  . Hirsutism 11/24/2015  . Hyperlipidemia 02/01/2014  . Impaired glucose tolerance 02/01/2014  . Vitamin D deficiency 02/01/2014  . Family history of diabetes mellitus 01/31/2014  . Morbid obesity with BMI of 40.0-44.9, adult ([place]) 01/31/2014  . Sjogren's syndrome ([place]) 01/17/2014    Jerl Mina ,PT, DPT, E-RYT  02/02/2019, 5:23 PM  [place] MAIN [facility] SERVICES [address] [place], [place], [address] Phone: [phone]   Fax:  [phone]  Name: Sheena Martinez MRN: [id number] Date of Birth: 1960-08-14

## 2019-02-03 ENCOUNTER — Inpatient Hospital Stay: Payer: Medicaid Other

## 2019-02-03 ENCOUNTER — Other Ambulatory Visit: Payer: Self-pay | Admitting: Family Medicine

## 2019-02-03 DIAGNOSIS — M170 Bilateral primary osteoarthritis of knee: Secondary | ICD-10-CM

## 2019-02-04 DIAGNOSIS — E785 Hyperlipidemia, unspecified: Secondary | ICD-10-CM | POA: Diagnosis not present

## 2019-02-05 ENCOUNTER — Other Ambulatory Visit: Payer: Self-pay | Admitting: Family Medicine

## 2019-02-05 DIAGNOSIS — R5383 Other fatigue: Secondary | ICD-10-CM

## 2019-02-05 DIAGNOSIS — G478 Other sleep disorders: Secondary | ICD-10-CM

## 2019-02-05 DIAGNOSIS — R351 Nocturia: Secondary | ICD-10-CM

## 2019-02-05 DIAGNOSIS — I890 Lymphedema, not elsewhere classified: Secondary | ICD-10-CM

## 2019-02-05 LAB — LIPID PANEL
Cholesterol: 201 mg/dL — ABNORMAL HIGH (ref <200)
HDL: 50 mg/dL (ref >=50)
LDL Cholesterol (Calc): 125 mg/dL (calc) — ABNORMAL HIGH
Non-HDL Cholesterol (Calc): 151 mg/dL (calc) — ABNORMAL HIGH (ref <130)
Total CHOL/HDL Ratio: 4 (calc) (ref <5.0)
Triglycerides: 143 mg/dL (ref <150)

## 2019-02-05 NOTE — Progress Notes (Signed)
Received note from Jerl Mina PT, DPT with [facility] PT department.  She requests OSA eval for [name].  Also requests signature for lymphedema specialist in OT department for patient's leg swelling - both of these requests are completed.

## 2019-02-10 ENCOUNTER — Ambulatory Visit: Payer: Medicaid Other | Attending: Family Medicine | Admitting: Physical Therapy

## 2019-02-10 ENCOUNTER — Other Ambulatory Visit: Payer: Self-pay

## 2019-02-10 DIAGNOSIS — M545 Low back pain, unspecified: Secondary | ICD-10-CM

## 2019-02-10 DIAGNOSIS — N393 Stress incontinence (female) (male): Secondary | ICD-10-CM | POA: Insufficient documentation

## 2019-02-10 DIAGNOSIS — I890 Lymphedema, not elsewhere classified: Secondary | ICD-10-CM | POA: Diagnosis present

## 2019-02-10 DIAGNOSIS — R1030 Lower abdominal pain, unspecified: Secondary | ICD-10-CM | POA: Diagnosis present

## 2019-02-10 DIAGNOSIS — M6281 Muscle weakness (generalized): Secondary | ICD-10-CM | POA: Diagnosis present

## 2019-02-10 NOTE — Therapy (Signed)
[place] MAIN [facility] SERVICES [address] [place], [place], [address] Phone: [phone]   Fax:  [phone]  Physical Therapy Treatment  Patient Details  Name: Sheena Martinez MRN: [id number] Date of Birth: 15-Feb-1961 Referring Provider (PT): Dr. Jed Limerick, NP   Encounter Date: 02/10/2019  PT End of Session - 02/10/19 1310    Visit Number  9    Date for PT Re-Evaluation  04/11/19    Authorization Type  Medicaid 04/07/19    PT Start Time  1304    PT Stop Time  1357    PT Time Calculation (min)  53 min    Activity Tolerance  Patient tolerated treatment well;No increased pain    Behavior During Therapy  WFL for tasks assessed/performed       Past Medical History:  Diagnosis Date  . Anxiety   . Arthritis   . 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 Corneal perforation of left eye 10/09/2017   Overview:  Added automatically from request for surgery [id number]  . GERD (gastroesophageal reflux disease)    OCC  . Goiter   . Hematuria 10/21/2018  . Hyperthyroidism   . Personal history of radiation therapy   . Sjogren's syndrome [facility])     Past Surgical History:  Procedure Laterality Date  . BREAST BIOPSY Left 05/12/2017   [place] bx/ INVASIVE MAMMARY CARCINOMA  . BREAST LUMPECTOMY Left 05/2017   invasive mammary carcinoma, neg margins  . BREAST LUMPECTOMY WITH SENTINEL LYMPH NODE BIOPSY Left 06/10/2017   Procedure: BREAST LUMPECTOMY WITH SENTINEL LYMPH NODE BX AND NEEDLE LOCALIZATION;  Surgeon: Robert Bellow, MD;  Location: [facility] ORS;  Service: General;  Laterality: Left;  . BREAST MAMMOSITE Left 06/24/2017   Procedure: MAMMOSITE BREAST;  Surgeon: Robert Bellow, MD;  Location: [facility] ORS;  Service: General;  Laterality: Left;  . CESAREAN SECTION  1995  . 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 COSMETIC SURGERY    . CYST EXCISION  2018   pilar cyst/ Dr Will Bonnet  .  EYE SURGERY    . PORTACATH PLACEMENT Right 07/08/2017   Procedure: INSERTION PORT-A-CATH;  Surgeon: Robert Bellow, MD;  Location: [facility] ORS;  Service: General;  Laterality: Right;  . TONSILLECTOMY      There were no vitals filed for this visit.  Subjective Assessment - 02/10/19 1309    Subjective  Pt felt tender to the touch over her shoulders and neck. Her R shoulder pain is doing better. Pt has a sleep doctor appt scheduled. She also is scheduled the lymphedema therapist  Pt hasb een doing body scan consistently and it really has helped her to calm down.  Pt tried her other HEP exercises once the past week .    Patient Stated Goals  reduce pain in back, pelvic pain and redue urinary leakage    Pain Onset  1 to 4 weeks ago         [facility] PT Assessment - 02/10/19 1355      Coordination   Gross Motor Movements are Fluid and Coordinated  --   poor scap control for depression and retraction     Palpation   Spinal mobility  signficantly decreased upper trap/ neck mm tightness. Increased tightness over thoracic posterior region/ lats  R > L  ( decreased post Tx)  [facility] Adult PT Treatment/Exercise - 02/10/19 1355      Neuro Re-ed    Neuro Re-ed Details   cued for scap depression and retraction      Moist Heat Therapy   Number Minutes Moist Heat  7 Minutes    Moist Heat Location  Other (comment)   cervical and thoracic      Manual Therapy   Manual therapy comments  STM/ MWM at throacic segments and paraspinal mm/ lats B                PT Short Term Goals - 12/31/18 1543      PT SHORT TERM GOAL #2   Title  lumbar ROM improved by 25% due to increased tissue and spine mobility    Time  4    Period  Weeks    Status  Achieved        PT Long Term Goals - 12/10/18 1654      PT LONG TERM GOAL #1   Title  independent with HEP and understand how to progress herself    Baseline  not educated yet    Time  4    Period  Months    Status   New    Target Date  04/11/19      PT LONG TERM GOAL #2   Title  ability to expand the abdomen due to improve lower rib mobility and tissue mobility to improve continence    Baseline  not able to expand abdomen and many trigger points in the abdomen    Time  4    Period  Months    Status  New    Target Date  04/11/19      PT LONG TERM GOAL #3   Title  pelvic floor strength >/= 3/5 holding for 10 seconds due to endurance and improved lengthening of the pelvic floor    Time  4    Period  Months    Status  New    Target Date  04/11/19      PT LONG TERM GOAL #4   Title  lumbar and lower abdominal pain decreased </= 1/10 50% of the time due to increased activity     Baseline  patient sits all day due to pain, pain level 2/10    Time  4    Period  Months    Status  New    Target Date  04/11/19            Plan - 02/10/19 1401    Clinical Impression Statement  Pt remains compliant with relaxation practices and is building up her compliance with stretching HEP . Pt showed significantly decreased body tensions which will help her minimize overactivity of her pelvic floor and urinary Sx relapse. Pt continues to benefit from skilled PT. Pt will be seeing a lymphedema therapist next week for evaluation of the swelling in her legs to rule in whether it is related to breast cancer Tx ( radiation and chemo).  Pt will resume PT after completing lymphedema therapist. Pt continues to benefit from skilled PT to achieve her goals.    Personal Factors and Comorbidities  Comorbidity 1;Comorbidity 2    Comorbidities  breast cancer with radiation, chemotherapy and presently taking Tomaxifin; Sjorgens syndrome    Examination-Activity Limitations  Locomotion Level;Continence    Examination-Participation Restrictions  Community Activity;Driving;Cleaning;Shop    Stability/Clinical Decision Making  Evolving/Moderate complexity    Rehab Potential  Excellent  PT Frequency  1x / week    PT Duration  Other  (comment)   4 months   PT Treatment/Interventions  Biofeedback;Functional mobility training;Neuromuscular re-education;Therapeutic exercise;Therapeutic activities;Patient/family education;Manual techniques;Dry needling;Scar mobilization    PT Next Visit Plan  transfer to [place] PT pelvic floor due to closer to home, work on pelvic floor contraction, abdominal and c-section scar massage; abdominal strength    PT Home Exercise Plan  Access Code: [email or web address]    Consulted and Agree with Plan of Care  Patient       Patient will benefit from skilled therapeutic intervention in order to improve the following deficits and impairments:  Increased fascial restricitons, Pain, Decreased coordination, Decreased mobility, Decreased scar mobility, Increased muscle spasms, Decreased activity tolerance, Decreased range of motion, Decreased strength  Visit Diagnosis: 1. Acute bilateral low back pain without sciatica   2. Muscle weakness (generalized)   3. Urinary, incontinence, stress female   4. Lower abdominal pain        Problem List Patient Active Problem List   Diagnosis Date Noted  . Facial flushing 10/21/2018  . Tinnitus of both ears 10/21/2018  . Drug-induced neutropenia ([place]) 10/20/2018  . 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 Thrombocytosis ([place]) 10/20/2018  . 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 Irregular menstrual cycle 03/02/2018  . Screening for cervical cancer 03/02/2018  . Long-term current use of tamoxifen 02/17/2018  . 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 Anxiety 10/03/2017  . Bilateral primary osteoarthritis of knee 10/03/2017  . 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 Malignant neoplasm of upper-inner quadrant of left breast in female, estrogen receptor positive ([place]) 05/22/2017  . Hirsutism 11/24/2015  . Hyperlipidemia 02/01/2014  . Impaired glucose tolerance 02/01/2014  . Vitamin D deficiency 02/01/2014   . Family history of diabetes mellitus 01/31/2014  . Morbid obesity with BMI of 40.0-44.9, adult ([place]) 01/31/2014  . Sjogren's syndrome ([place]) 01/17/2014    Jerl Mina ,PT, DPT, E-RYT  02/10/2019, 2:02 PM  [place] MAIN [facility] SERVICES [address] [place], [place], [address] Phone: [phone]   Fax:  [phone]  Name: Sheena Martinez MRN: [id number] Date of Birth: 11/09/1960

## 2019-02-10 NOTE — Patient Instructions (Addendum)
6 directions of neck  Side to side chin and down Look over shoulder ( no picking up the neck if laying down)   If done seated, hands press onto the chair seat shoulders down   ___   Open book ( perform at the end of the opening, shoulders relax down and back)

## 2019-02-11 ENCOUNTER — Telehealth: Payer: Self-pay | Admitting: Internal Medicine

## 2019-02-11 ENCOUNTER — Ambulatory Visit (INDEPENDENT_AMBULATORY_CARE_PROVIDER_SITE_OTHER): Payer: Medicaid Other | Admitting: Internal Medicine

## 2019-02-11 ENCOUNTER — Other Ambulatory Visit: Payer: Self-pay | Admitting: Family Medicine

## 2019-02-11 DIAGNOSIS — M170 Bilateral primary osteoarthritis of knee: Secondary | ICD-10-CM

## 2019-02-11 DIAGNOSIS — G4719 Other hypersomnia: Secondary | ICD-10-CM | POA: Diagnosis not present

## 2019-02-11 NOTE — Telephone Encounter (Signed)
Epworth has been obtained and updated in pt's chart. Nothing further is needed.

## 2019-02-11 NOTE — Addendum Note (Signed)
Addended by: Laverle Hobby on: 02/11/2019 03:23 PM   Modules accepted: Orders

## 2019-02-11 NOTE — Telephone Encounter (Signed)
Left message to obtain epworth.

## 2019-02-11 NOTE — Patient Instructions (Signed)

## 2019-02-11 NOTE — Addendum Note (Signed)
Addended by: Maryanna Shape A on: 02/11/2019 11:47 AM   Modules accepted: Orders

## 2019-02-11 NOTE — Progress Notes (Addendum)
[facility] Consultation     Virtual Visit via Telephone Note I connected with patient on 02/11/19 at 11:30 AM EDT by telephone and verified that I am speaking with the correct person using two identifiers.   I discussed the limitations, risks of performing an evaluation and management service by telephone and the availability of in person appointments. I also discussed with the patient that there may be a patient responsible charge related to this service.  In light of current covid-19 pandemic, patient also understands that we are trying to protect them by minimizing in office contact if at all possible.  The patient expressed understanding and agreed to proceed. Please see note below for further detail.    The patient was advised to call back or seek an in-person evaluation if the symptoms worsen or if the condition fails to improve as anticipated.   Sheena Hobby, MD   Assessment and Plan:  Excessive daytime sleepiness. -Symptoms and signs of obstructive sleep apnea -We will start on CPAP as indicated.   Poor sleep hygiene, insomnia, RLS. - Patient currently taking Ativan 0.5 mg nightly to help her fall asleep, which appears to work. - RLS symptoms appear to be controlled with this regimen. - Patient typically spends most of the night in her recliner, then moved to her bed for about 2 hours.  Discussed that if started on CPAP she will need to stay in either the recliner of the bed for the night.  Addendum: HST not covered my medicaid, will order split night.   Date: 02/11/2019  MRN# [id number] Sheena Martinez 1960/12/31  Referring Physician: Raelyn Ensign, FNP.   Sheena Martinez is a 58 y.o. old female seen in consultation for chief complaint of: Excessive daytime sleepiness.    HPI:  Sheena Martinez is a 58 y.o. old female, she has been having pelvic floor PT. She has been noted to be snoring and having witnessed apneas. She is mildly sleepy during the  day.  She takes ativan 0.5 mg at 8 pm. She watches tv in the living room until MN, then goes to her recliner and practices relaxation technique and then falls asleep usually before 1 am.  She wakes at 7 am moves to her bed in order to stretch out, listens to soothing sounds to fall back to sleep until about 10 am. She does not nap during the day.  She snores loudly.  She has RLS and body aches, her legs get achy and she has to keep rubbing them.  She denies jaw pain but has jaw popping. She denies cataplexy, sleep paralysis. She has never been tested for OSA in the past.      PMHX:   Past Medical History:  Diagnosis Date  . Anxiety   . Arthritis   . 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 Corneal perforation of left eye 10/09/2017   Overview:  Added automatically from request for surgery [id number]  . GERD (gastroesophageal reflux disease)    OCC  . Goiter   . Hematuria 10/21/2018  . Hyperthyroidism   . Personal history of radiation therapy   . Sjogren's syndrome ([place])    Surgical Hx:  Past Surgical History:  Procedure Laterality Date  . BREAST BIOPSY Left 05/12/2017   [place] bx/ INVASIVE MAMMARY CARCINOMA  . BREAST LUMPECTOMY Left 05/2017   invasive mammary carcinoma, neg margins  . BREAST LUMPECTOMY WITH SENTINEL LYMPH  NODE BIOPSY Left 06/10/2017   Procedure: BREAST LUMPECTOMY WITH SENTINEL LYMPH NODE BX AND NEEDLE LOCALIZATION;  Surgeon: Robert Bellow, MD;  Location: [facility] ORS;  Service: General;  Laterality: Left;  . BREAST MAMMOSITE Left 06/24/2017   Procedure: MAMMOSITE BREAST;  Surgeon: Robert Bellow, MD;  Location: [facility] ORS;  Service: General;  Laterality: Left;  . CESAREAN SECTION  1995  . 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 COSMETIC SURGERY    . CYST EXCISION  2018   pilar cyst/ Dr Will Bonnet  . EYE SURGERY    . PORTACATH PLACEMENT Right 07/08/2017   Procedure: INSERTION PORT-A-CATH;  Surgeon:  Robert Bellow, MD;  Location: [facility] ORS;  Service: General;  Laterality: Right;  . TONSILLECTOMY     Family Hx:  Family History  Problem Relation Age of Onset  . Breast cancer Maternal Aunt 47       currently ~65  . Diabetes Mother   . Skin cancer Mother        currently 71; TAH/BSO (age?)  . Anemia Mother   . Liver disease Father        deceased / not much info about him / alcoholic  . Lung cancer Paternal Aunt        smoker / deceased 20s  . Stomach cancer Paternal Uncle 15       deceased / deceased 70s  . Ovarian cancer Maternal Grandmother 34       deceased 90s  . Stomach cancer Paternal Grandfather 73       deceased 48s  . Cancer Maternal Uncle        unk. type; deceased 44s  . Leukemia Cousin 25       deceased 49   Social Hx:   Social History   Tobacco Use  . Smoking status: Former Smoker    Years: 7.00    Types: Cigarettes    Quit date: 08/13/1979    Years since quitting: 39.5  . Smokeless tobacco: Never Used  . Tobacco comment: AGE [age]  Substance Use Topics  . Alcohol use: No  . Drug use: No   Medication:    Current Outpatient Medications:  .  acetaminophen (TYLENOL) 500 MG tablet, Take 1,000 mg by mouth every 8 (eight) hours as needed for mild pain or headache., Disp: , Rfl:  .  brimonidine (ALPHAGAN) 0.2 % ophthalmic solution, INSTILL 1 DROP INTO LEFT EYE THREE TIMES DAILY, Disp: , Rfl: 12 .  Ca Carbonate-Mag Hydroxide (ROLAIDS PO), Take 1 tablet as needed by mouth (indigestion). , Disp: , Rfl:  .  carboxymethylcellulose 1 % ophthalmic solution, Place 1 drop into the left eye 4 (four) times daily., Disp: , Rfl:  .  cholecalciferol (VITAMIN D) 1000 units tablet, Take 2,000 Units by mouth daily., Disp: , Rfl:  .  cycloSPORINE (RESTASIS) 0.05 % ophthalmic emulsion, INSTILL 1 DROP INTO EACH EYE TWICE DAILY, Disp: , Rfl:  .  ketoconazole (NIZORAL) 2 % shampoo, Apply 1 application topically 2 (two) times a week., Disp: 120 mL, Rfl: 3 .  ketorolac (TORADOL)  10 MG tablet, Take 1 tablet (10 mg total) by mouth every 8 (eight) hours as needed for severe pain. (Patient not taking: Reported on 12/23/2018), Disp: 20 tablet, Rfl: 0 .  LORazepam (ATIVAN) 0.5 MG tablet, , Disp: , Rfl: 0 .  meloxicam (MOBIC) 15 MG tablet, Take 7.5 mg by mouth daily., Disp: , Rfl:  .  methimazole (TAPAZOLE) 10 MG tablet, TAKE 1  TABLET BY MOUTH ONCE DAILY AT 6AM, Disp: , Rfl: 2 .  oxybutynin (DITROPAN XL) 10 MG 24 hr tablet, Take 10 mg by mouth at bedtime., Disp: , Rfl:  .  prednisoLONE acetate (PRED FORTE) 1 % ophthalmic suspension, , Disp: , Rfl: 1 .  sertraline (ZOLOFT) 100 MG tablet, Take 150 mg by mouth daily., Disp: , Rfl: 1 .  tamoxifen (NOLVADEX) 20 MG tablet, Take 1 tablet (20 mg total) by mouth daily., Disp: 30 tablet, Rfl: 1 .  Timolol Maleate 0.5 % (DAILY) SOLN, Place 1 drop into the left eye 2 (two) times daily., Disp: , Rfl:  .  tizanidine (ZANAFLEX) 2 MG capsule, Take 1 capsule (2 mg total) by mouth 3 (three) times daily., Disp: 30 capsule, Rfl: 1 .  VESICARE 5 MG tablet, Take 5 mg by mouth daily., Disp: , Rfl:  .  vitamin C (ASCORBIC ACID) 500 MG tablet, Take 500 mg by mouth daily., Disp: , Rfl:  .  vitamin k 100 MCG tablet, Take 100 mcg by mouth 2 (two) times a week., Disp: , Rfl:    Allergies:  Rituximab, Benadryl [diphenhydramine], Diphenhydramine hcl, Clonazepam, and Sulfamethoxazole-trimethoprim  Review of Systems: Gen:  Denies  fever, sweats, chills HEENT: Denies blurred vision, double vision. bleeds, sore throat Cvc:  No dizziness, chest pain. Resp:   Denies cough or sputum production, shortness of breath Gi: Denies swallowing difficulty, stomach pain. Gu:  Denies bladder incontinence, burning urine Ext:   No Joint pain, stiffness. Skin: No skin rash,  hives  Endoc:  No polyuria, polydipsia. Psych: No depression, insomnia. Other:  All other systems were reviewed with the patient and were negative other that what is mentioned in the HPI.    Physical Examination:  -  LABORATORY PANEL:   CBC No results for input(s): WBC, HGB, HCT, PLT in the last 168 hours. ------------------------------------------------------------------------------------------------------------------  Chemistries  No results for input(s): NA, K, CL, CO2, GLUCOSE, BUN, CREATININE, CALCIUM, MG, AST, ALT, ALKPHOS, BILITOT in the last 168 hours.  Invalid input(s): GFRCGP ------------------------------------------------------------------------------------------------------------------  Cardiac Enzymes No results for input(s): TROPONINI in the last 168 hours. ------------------------------------------------------------  RADIOLOGY:  No results found.     Thank  you for the consultation and for allowing [facility] to assist in the care of your patient. Our recommendations are noted above.  Please contact us if we can be of further service.   Marda Stalker, M.D., F.C.C.P.  Board Certified in Internal Medicine, Pulmonary Medicine, [place], and Sleep Medicine.  [facility] Office Number: [phone]   02/11/2019

## 2019-02-12 DIAGNOSIS — Z947 Corneal transplant status: Secondary | ICD-10-CM | POA: Diagnosis not present

## 2019-02-16 DIAGNOSIS — F410 Panic disorder [episodic paroxysmal anxiety] without agoraphobia: Secondary | ICD-10-CM | POA: Diagnosis not present

## 2019-02-16 DIAGNOSIS — F419 Anxiety disorder, unspecified: Secondary | ICD-10-CM | POA: Diagnosis not present

## 2019-02-16 DIAGNOSIS — H9319 Tinnitus, unspecified ear: Secondary | ICD-10-CM | POA: Diagnosis not present

## 2019-02-17 ENCOUNTER — Ambulatory Visit: Payer: Medicaid Other | Admitting: Occupational Therapy

## 2019-02-17 ENCOUNTER — Encounter: Payer: Medicaid Other | Admitting: Physical Therapy

## 2019-02-17 DIAGNOSIS — H16002 Unspecified corneal ulcer, left eye: Secondary | ICD-10-CM | POA: Diagnosis not present

## 2019-02-17 DIAGNOSIS — H04123 Dry eye syndrome of bilateral lacrimal glands: Secondary | ICD-10-CM | POA: Diagnosis not present

## 2019-02-17 DIAGNOSIS — Z947 Corneal transplant status: Secondary | ICD-10-CM | POA: Diagnosis not present

## 2019-02-17 DIAGNOSIS — M3500 Sicca syndrome, unspecified: Secondary | ICD-10-CM | POA: Diagnosis not present

## 2019-02-18 ENCOUNTER — Ambulatory Visit: Payer: Medicaid Other | Admitting: Physical Therapy

## 2019-02-18 ENCOUNTER — Telehealth: Payer: Self-pay | Admitting: Family Medicine

## 2019-02-18 ENCOUNTER — Ambulatory Visit: Payer: Medicaid Other | Admitting: Occupational Therapy

## 2019-02-18 ENCOUNTER — Other Ambulatory Visit: Payer: Self-pay

## 2019-02-18 ENCOUNTER — Encounter: Payer: Self-pay | Admitting: Occupational Therapy

## 2019-02-18 DIAGNOSIS — I890 Lymphedema, not elsewhere classified: Secondary | ICD-10-CM

## 2019-02-18 DIAGNOSIS — M545 Low back pain: Secondary | ICD-10-CM | POA: Diagnosis not present

## 2019-02-18 MED ORDER — MELOXICAM 15 MG PO TABS: 7.5000 mg | ORAL_TABLET | Freq: Every day | ORAL | 0 refills | Status: DC

## 2019-02-18 NOTE — Telephone Encounter (Signed)
Pt.notified

## 2019-02-18 NOTE — Telephone Encounter (Signed)
Please let her know I sent in a refill.  Caution advised - can cause GI bleeding - make sure to take with food.

## 2019-02-18 NOTE — Patient Instructions (Signed)

## 2019-02-18 NOTE — Telephone Encounter (Signed)
meloxicam (MOBIC) 15 MG tablet  [facility] [place], [place] [phone] (Phone) [phone] (Fax)   Pt has called in 3 time pt had appt on 01/28/2019

## 2019-02-22 ENCOUNTER — Other Ambulatory Visit: Payer: Self-pay

## 2019-02-22 ENCOUNTER — Telehealth: Payer: Self-pay | Admitting: Internal Medicine

## 2019-02-22 NOTE — Telephone Encounter (Signed)
Pt is aware of covid testing date/time.  Nothing further is needed.

## 2019-02-23 ENCOUNTER — Encounter: Payer: Self-pay | Admitting: Occupational Therapy

## 2019-02-23 NOTE — Therapy (Signed)
[place] MAIN [facility] SERVICES [address] [place], [place], [address] Phone: [phone]   Fax:  [phone]  Occupational Therapy Evaluation  Patient Details  Name: Sheena Martinez MRN: [id number] Date of Birth: 12/27/1960 Referring Provider (OT): Raelyn Ensign, FNP   Encounter Date: 02/18/2019    Past Medical History:  Diagnosis Date  . Anxiety   . Arthritis   . 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 Corneal perforation of left eye 10/09/2017   Overview:  Added automatically from request for surgery [id number]  . GERD (gastroesophageal reflux disease)    OCC  . Goiter   . Hematuria 10/21/2018  . Hyperthyroidism   . Personal history of radiation therapy   . Sjogren's syndrome [facility])     Past Surgical History:  Procedure Laterality Date  . BREAST BIOPSY Left 05/12/2017   [place] bx/ INVASIVE MAMMARY CARCINOMA  . BREAST LUMPECTOMY Left 05/2017   invasive mammary carcinoma, neg margins  . BREAST LUMPECTOMY WITH SENTINEL LYMPH NODE BIOPSY Left 06/10/2017   Procedure: BREAST LUMPECTOMY WITH SENTINEL LYMPH NODE BX AND NEEDLE LOCALIZATION;  Surgeon: Robert Bellow, MD;  Location: [facility] ORS;  Service: General;  Laterality: Left;  . BREAST MAMMOSITE Left 06/24/2017   Procedure: MAMMOSITE BREAST;  Surgeon: Robert Bellow, MD;  Location: [facility] ORS;  Service: General;  Laterality: Left;  . CESAREAN SECTION  1995  . 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 COSMETIC SURGERY    . CYST EXCISION  2018   pilar cyst/ Dr Will Bonnet  . EYE SURGERY    . PORTACATH PLACEMENT Right 07/08/2017   Procedure: INSERTION PORT-A-CATH;  Surgeon: Robert Bellow, MD;  Location: [facility] ORS;  Service: General;  Laterality: Right;  . TONSILLECTOMY      There were no vitals filed for this visit.     [facility] OT Assessment - 02/23/19 0001      Assessment   Medical Diagnosis  chroninc RLE swelling, > 10  yrs; LUE/LUQ , stage 0 (subclinical) post-mastectomy Lymphedema    Referring Provider (OT)  Raelyn Ensign, FNP    Prior Therapy  no CDT, no compression      Precautions   Precautions  Other (comment)   Lymphedema precaution [place] Shower/Tub  Tub/Shower unit;Other (comment)   grab bar in show, no seat   Bathroom Toilet  Standard    Additional Comments  3 steps to enter 2 story home; B handrails      Prior Function   Level of Independence  Independent with basic ADLs;Independent with household mobility without device;Independent with community mobility without device;Independent with homemaking with ambulation;Independent with gait;Independent with transfers    [employer]  Unemployed    Vocation Requirements  sedentary lifestyle, dependent sitting most of the day    Leisure  TV      ADL   Eating/Feeding  Independent    Grooming  Independent    Upper Body Bathing  Independent    Lower Body Bathing  Independent    Upper Body Dressing  Independent    Lower Body Dressing  Independent;Increased time;Other (comment)   R foot/ ankle swelling limits fitting preferred street shoes   Leisure centre manager  Independent      IADL   Prior Level of Function Shopping  I    Shopping  Takes care of all shopping needs independently    Prior Level of Function Light Housekeeping  I    Light Housekeeping  Does personal laundry completely;Maintains house alone or with occasional assistance    Prior Level of Function Meal Prep  I    Meal Prep  Plans, prepares and serves adequate meals independently    Prior Level of Function Community Mobility  I    Investment banker, corporate own Art therapist Status  Independent      Vision - History   Baseline Vision  Wears glasses all the time      Activity Tolerance   Activity Tolerance  Endurance does not limit participation in activity      Cognition   Overall Cognitive  Status  Within Functional Limits for tasks assessed      Observation/Other Assessments   Observations  mild R ankle swelling with slight fatty subcutaneous fibrosis at meial and lateral malleolus. Skin WNL w/out signs of infection. Stemmer sign negative bilaterally.       Posture/Postural Control   Posture/Postural Control  No significant limitations      Sensation   Light Touch  Appears Intact      Coordination   Gross Motor Movements are Fluid and Coordinated  Yes      AROM   Overall AROM Comments  WFL BUE/ BLE. flexibility at ankles WNL               OT Treatments/Exercises (OP) - 02/23/19 0001      Transfers   Transfers  Sit to Stand;Stand to Sit    Sit to Stand  7: Independent    Stand to Sit  7: Independent      ADLs   Overall ADLs  standing and walking tolerance limited by R ankle pain and swelling > 390 minutes with instrumental ADLs    ADL Education Given  Yes      Manual Therapy   Manual Therapy  Edema management              OT Short Term Goals - 02/23/19 0947      OT SHORT TERM GOAL #1   Title  Pt will demonstrate understanding of lymphedema diagnosis,, progression, risk factors, precautions and prevention strategies to limit progression of stage 0, subclinical, LUE/LUQ post mastectomy lymphedema.    Baseline  Max A    Time  1    Period  Days    Status  Achieved    Target Date  02/18/19               Plan - 02/23/19 4196    Clinical Impression Statement  AASHI DERRINGTON presents with mild R ankle swelling s/p ankle sprain > 10 years ago. Pt does not present with signs and saymptoms of LE lymphedema and OT for Complete Decongestive Therapy (CDT) is not indicated at this time. Pt may benefit from an orthopedic consult to rule out bone metastasis and to fit with ankle brace for use during activities requiring standing and/or walking > 30 minutes. Pt does present with stage 0, subclinical , LUE/LUQ, post mastectomy lymphedema 2/2 to her  history of L breast cancer treatment including lumpectomy, SLND and XRT. Pt was provided with extensive education during our visit re post mastectomy lymphedema precautions and prevention strategies to limitrisk of progression. She was also provided with a printed Lymphedema Workbook for reference. She verbalized understanding of lymphedema risk factors,  precautions and signs and symptoms after skilled education. Additional Occupational Therapy services for lymphedema care are not medically necessary at ths time. Should this patient require services in the future I am pleased to assist. Thank you for your referral.    OT Occupational Profile and History  Comprehensive Assessment- Review of records and extensive additional review of physical, cognitive, psychosocial history related to current functional performance    Clinical Decision Making  Limited treatment options, no task modification necessary    Comorbidities impacting occupational performance description:  morbid obesity    Modification or Assistance to Complete Evaluation   No modification of tasks or assist necessary to complete eval    OT Treatment/Interventions  Self-care/ADL training    Plan  Pt will obtain prophylactic compression arm sleeve PRN for air travel. Pt understands standard LE precautions and will avoid needle sticks and BP to affected LUE/LUQ    Consulted and Agree with Plan of Care  Patient       Patient will benefit from skilled therapeutic intervention in order to improve the following deficits and impairments:           Visit Diagnosis: 1. Lymphedema, not elsewhere classified       Problem List Patient Active Problem List   Diagnosis Date Noted  . Facial flushing 10/21/2018  . Tinnitus of both ears 10/21/2018  . Drug-induced neutropenia ([place]) 10/20/2018  . 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 Thrombocytosis ([place]) 10/20/2018  . 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 Irregular menstrual cycle 03/02/2018  . Screening for cervical cancer 03/02/2018  . Long-term current use of tamoxifen 02/17/2018  . 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 Anxiety 10/03/2017  . Bilateral primary osteoarthritis of knee 10/03/2017  . 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 Malignant neoplasm of upper-inner quadrant of left breast in female, estrogen receptor positive ([place]) 05/22/2017  . Hirsutism 11/24/2015  . Hyperlipidemia 02/01/2014  . Impaired glucose tolerance 02/01/2014  . Vitamin D deficiency 02/01/2014  . Family history of diabetes mellitus 01/31/2014  . Morbid obesity with BMI of 40.0-44.9, adult ([place]) 01/31/2014  . Sjogren's syndrome ([place]) 01/17/2014    Andrey Spearman, MS, OTR/L, [facility] 02/23/19 9:56 AM  [place] MAIN [facility] SERVICES [address] [place], [place], [address] Phone: [phone]   Fax:  [phone]  Name: RHENA GLACE MRN: [id number] Date of Birth: May 26, 1961

## 2019-02-24 ENCOUNTER — Ambulatory Visit: Payer: Medicaid Other | Admitting: Physical Therapy

## 2019-02-24 ENCOUNTER — Other Ambulatory Visit: Payer: Self-pay

## 2019-02-24 DIAGNOSIS — M545 Low back pain, unspecified: Secondary | ICD-10-CM

## 2019-02-24 DIAGNOSIS — N393 Stress incontinence (female) (male): Secondary | ICD-10-CM

## 2019-02-24 DIAGNOSIS — R1030 Lower abdominal pain, unspecified: Secondary | ICD-10-CM

## 2019-02-24 DIAGNOSIS — M6281 Muscle weakness (generalized): Secondary | ICD-10-CM

## 2019-02-24 NOTE — Patient Instructions (Addendum)
Place red band in letter U on bed before laying     band under ballmounds  while laying on back w/ knees bent  "W" exercise  10 reps x 2 sets   Band is placed under feet, knees bent, feet are hip width apart Hold band with thumbs point out, keep upper arm and elbow touching the bed the whole time  - inhale and then exhale pull bands by bending elbows hands move in a "w"  Keep shoulder, elbow down   (feel shoulder blades squeezing)  ______________

## 2019-02-24 NOTE — Therapy (Addendum)
[place] MAIN [facility] SERVICES [address] [place], [place], [address] Phone: [phone]   Fax:  [phone]  Physical Therapy Treatment   Patient Details  Name: Sheena Martinez MRN: [id number] Date of Birth: 28-Feb-1961 Referring Provider (PT): Dr. Jed Limerick, NP   Encounter Date: 02/24/2019  PT End of Session - 02/24/19 1310    Visit Number  10   Date for PT Re-Evaluation  04/11/19    Authorization Type  Medicaid 04/07/19    PT Start Time  1304    PT Stop Time  1404    PT Time Calculation (min)  60 min    Activity Tolerance  Patient tolerated treatment well;No increased pain    Behavior During Therapy  WFL for tasks assessed/performed       Past Medical History:  Diagnosis Date  . Anxiety   . Arthritis   . 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 Corneal perforation of left eye 10/09/2017   Overview:  Added automatically from request for surgery [id number]  . GERD (gastroesophageal reflux disease)    OCC  . Goiter   . Hematuria 10/21/2018  . Hyperthyroidism   . Personal history of radiation therapy   . Sjogren's syndrome [facility])     Past Surgical History:  Procedure Laterality Date  . BREAST BIOPSY Left 05/12/2017   [place] bx/ INVASIVE MAMMARY CARCINOMA  . BREAST LUMPECTOMY Left 05/2017   invasive mammary carcinoma, neg margins  . BREAST LUMPECTOMY WITH SENTINEL LYMPH NODE BIOPSY Left 06/10/2017   Procedure: BREAST LUMPECTOMY WITH SENTINEL LYMPH NODE BX AND NEEDLE LOCALIZATION;  Surgeon: Robert Bellow, MD;  Location: [facility] ORS;  Service: General;  Laterality: Left;  . BREAST MAMMOSITE Left 06/24/2017   Procedure: MAMMOSITE BREAST;  Surgeon: Robert Bellow, MD;  Location: [facility] ORS;  Service: General;  Laterality: Left;  . CESAREAN SECTION  1995  . 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 COSMETIC SURGERY    . CYST EXCISION  2018   pilar cyst/ Dr Will Bonnet  .  EYE SURGERY    . PORTACATH PLACEMENT Right 07/08/2017   Procedure: INSERTION PORT-A-CATH;  Surgeon: Robert Bellow, MD;  Location: [facility] ORS;  Service: General;  Laterality: Right;  . TONSILLECTOMY      There were no vitals filed for this visit.  Subjective Assessment - 02/24/19 1305    Subjective  Pt reported she had a eval with the lymphedema therapist who confirmed no lymphedema in her R ankle and she advised pt to get further imaging. Pt had sprained her ankle    Patient Stated Goals  reduce pain in back, pelvic pain and redue urinary leakage    Pain Onset  1 to 4 weeks ago         [facility] PT Assessment - 02/24/19 1336      Observation/Other Assessments   Observations  R shoulder IR when seated      Coordination   Gross Motor Movements are Fluid and Coordinated  --   poor scap retraction/ depression     AROM   Overall AROM Comments  cervi ext/flex, side flexion 40 deg B,    rotation B 55 deg  ( post Tx: cervical side flexion 50 deg B)      Strength   Overall Strength Comments  BUE 5/5 except shoulder ER 4-/5 B       Palpation  Spinal mobility  significantly decreased midback/ upper back/ paraspinals       Bed Mobility   Bed Mobility  --   half crunch                   [facility] Adult PT Treatment/Exercise - 02/24/19 1413      Therapeutic Activites    ADL's  biopsychosocial approaches , relaxation, anatomy/ physiology explanations, logrolling technique without shoulder IR        Neuro Re-ed    Neuro Re-ed Details   cued for excessive cues for scap retraction/depression B                PT Short Term Goals - 12/31/18 1543      PT SHORT TERM GOAL #2   Title  lumbar ROM improved by 25% due to increased tissue and spine mobility    Time  4    Period  Weeks    Status  Achieved           PT Long Term Goals - 02/24/19 1418      PT LONG TERM GOAL #1   Title  independent with HEP and understand how to progress herself    Baseline  not  educated yet    Time  4    Period  Months    Status  On-going      PT LONG TERM GOAL #2   Title  ability to expand the abdomen due to improve lower rib mobility and tissue mobility to improve continence    Baseline  not able to expand abdomen and many trigger points in the abdomen    Time  4    Period  Months    Status  Achieved      PT LONG TERM GOAL #3   Title  pelvic floor strength >/= 3/5 holding for 10 seconds due to endurance and improved lengthening of the pelvic floor    Baseline  2/5    Time  4    Period  Months    Status  On-going      PT LONG TERM GOAL #4   Title  lumbar and lower abdominal pain decreased </= 1/10 50% of the time due to increased activity     Baseline  patient sits all day due to pain, pain level 2/10    Time  4    Period  Months    Status  On-going      PT LONG TERM GOAL #5   Title  Pt will demo no cues for scapular retraction/ depression in order to minimize shoulder IR/ upper trap overuse and decrease pelvic floor tightness    Baseline  upper trap overuse    Time  4    Period  Weeks    Status  New    Target Date  03/24/19         Plan - 02/24/19 1411    Clinical Impression Statement  Pt continues to report applying body scan and relaxation with stressful events in her life. Her compliances has helped her to decrease body tensions. Pt appeared less guarded and demo'd improved mobility in spine and better coordination of diaphragm./ pelvic floor. Pt demo'd poor posture habits with shoulder IR,  limited cervical mobility and forward head posture, decreased scapular stabilization. Following Tx today, pt demo'd increased mobility and demo'd improved posture with scapular stabilization.  Pt continues to benefit from skilled PT with further need to strengthening posterior mm and  realign cervical spine with rest of spine and pelvis to minimize overuse of upper trap and poor posture which impacts pelvic floor.   Pt provided the following update: Pt's  lymphedema evaluation with lymphedema specialist rendered the findings that the R ankle swelling was not due to lymphedema but possibility related to past ankle sprain. Lymphedema specialist recommended further imaging but pt will postpone until next month.  Pt is committed to following up with her sleep study this month.       Personal Factors and Comorbidities  Comorbidity 1;Comorbidity 2    Comorbidities  breast cancer with radiation, chemotherapy and presently taking Tomaxifin; Sjorgens syndrome    Examination-Activity Limitations  Locomotion Level;Continence    Examination-Participation Restrictions  Community Activity;Driving;Cleaning;Shop    Stability/Clinical Decision Making  Evolving/Moderate complexity    Rehab Potential  Excellent    PT Frequency  1x / week    PT Duration  Other (comment)   4 months   PT Treatment/Interventions  Biofeedback;Functional mobility training;Neuromuscular re-education;Therapeutic exercise;Therapeutic activities;Patient/family education;Manual techniques;Dry needling;Scar mobilization    PT Next Visit Plan  transfer to [place] PT pelvic floor due to closer to home, work on pelvic floor contraction, abdominal and c-section scar massage; abdominal strength    PT Home Exercise Plan  Access Code: [email or web address]    Consulted and Agree with Plan of Care  Patient       Patient will benefit from skilled therapeutic intervention in order to improve the following deficits and impairments:  Increased fascial restricitons, Pain, Decreased coordination, Decreased mobility, Decreased scar mobility, Increased muscle spasms, Decreased activity tolerance, Decreased range of motion, Decreased strength  Visit Diagnosis: 1. Acute bilateral low back pain without sciatica   2. Muscle weakness (generalized)   3. Urinary, incontinence, stress female   4. Lower abdominal pain       Problem List Patient Active Problem List   Diagnosis Date Noted  . Facial flushing 10/21/2018   . Tinnitus of both ears 10/21/2018  . Drug-induced neutropenia ([place]) 10/20/2018  . 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 Thrombocytosis ([place]) 10/20/2018  . 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 Irregular menstrual cycle 03/02/2018  . Screening for cervical cancer 03/02/2018  . Long-term current use of tamoxifen 02/17/2018  . 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 Anxiety 10/03/2017  . Bilateral primary osteoarthritis of knee 10/03/2017  . 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 Malignant neoplasm of upper-inner quadrant of left breast in female, estrogen receptor positive ([place]) 05/22/2017  . Hirsutism 11/24/2015  . Hyperlipidemia 02/01/2014  . Impaired glucose tolerance 02/01/2014  . Vitamin D deficiency 02/01/2014  . Family history of diabetes mellitus 01/31/2014  . Morbid obesity with BMI of 40.0-44.9, adult ([place]) 01/31/2014  . Sjogren's syndrome ([place]) 01/17/2014    Jerl Mina ,PT, DPT, E-RYT  02/24/2019, 2:29 PM  [place] MAIN [facility] SERVICES [address] [place], [place], [address] Phone: [phone]   Fax:  [phone]  Name: CAYDENCE KOENIG MRN: [id number] Date of Birth: 05-Apr-1961

## 2019-02-25 DIAGNOSIS — E0520 Thyrotoxicosis with toxic multinodular goiter without thyrotoxic crisis or storm: Secondary | ICD-10-CM | POA: Diagnosis not present

## 2019-02-25 DIAGNOSIS — E041 Nontoxic single thyroid nodule: Secondary | ICD-10-CM | POA: Diagnosis not present

## 2019-02-26 ENCOUNTER — Other Ambulatory Visit
Admission: RE | Admit: 2019-02-26 | Discharge: 2019-02-26 | Disposition: A | Discharge status: Home / Self Care | Payer: Medicaid Other | Source: Ambulatory Visit | Attending: Internal Medicine | Admitting: Internal Medicine

## 2019-02-26 ENCOUNTER — Other Ambulatory Visit: Payer: Self-pay

## 2019-02-26 DIAGNOSIS — H40052 Ocular hypertension, left eye: Secondary | ICD-10-CM | POA: Diagnosis not present

## 2019-02-26 DIAGNOSIS — H2513 Age-related nuclear cataract, bilateral: Secondary | ICD-10-CM | POA: Diagnosis not present

## 2019-02-26 DIAGNOSIS — H16002 Unspecified corneal ulcer, left eye: Secondary | ICD-10-CM | POA: Diagnosis not present

## 2019-02-26 DIAGNOSIS — H40003 Preglaucoma, unspecified, bilateral: Secondary | ICD-10-CM | POA: Diagnosis not present

## 2019-02-26 DIAGNOSIS — Z1159 Encounter for screening for other viral diseases: Secondary | ICD-10-CM | POA: Insufficient documentation

## 2019-02-26 LAB — SARS CORONAVIRUS 2 (TAT 6-24 HRS): SARS Coronavirus 2: NEGATIVE

## 2019-03-02 ENCOUNTER — Encounter: Payer: Self-pay | Admitting: General Surgery

## 2019-03-02 ENCOUNTER — Ambulatory Visit: Payer: Medicaid Other | Attending: Internal Medicine

## 2019-03-02 DIAGNOSIS — G478 Other sleep disorders: Secondary | ICD-10-CM | POA: Diagnosis present

## 2019-03-02 DIAGNOSIS — G4733 Obstructive sleep apnea (adult) (pediatric): Secondary | ICD-10-CM | POA: Insufficient documentation

## 2019-03-03 ENCOUNTER — Other Ambulatory Visit: Payer: Self-pay

## 2019-03-03 ENCOUNTER — Ambulatory Visit: Payer: Medicaid Other | Admitting: Physical Therapy

## 2019-03-03 DIAGNOSIS — M545 Low back pain, unspecified: Secondary | ICD-10-CM

## 2019-03-03 DIAGNOSIS — I890 Lymphedema, not elsewhere classified: Secondary | ICD-10-CM

## 2019-03-03 DIAGNOSIS — N393 Stress incontinence (female) (male): Secondary | ICD-10-CM

## 2019-03-03 DIAGNOSIS — M6281 Muscle weakness (generalized): Secondary | ICD-10-CM

## 2019-03-03 DIAGNOSIS — R1030 Lower abdominal pain, unspecified: Secondary | ICD-10-CM

## 2019-03-03 NOTE — Therapy (Addendum)
[place] MAIN [facility] SERVICES [address] [place], [place], [address] Phone: [phone]   Fax:  [phone]  Physical Therapy Treatment  Patient Details  Name: Sheena Martinez MRN: [id number] Date of Birth: 06-May-1961 Referring Provider (PT): Dr. Jed Limerick, NP   Encounter Date: 03/03/2019  PT End of Session - 03/03/19 1310    Visit Number  11    Date for PT Re-Evaluation  04/07/19    Authorization Type  Medicaid 04/07/19    PT Start Time  1304    PT Stop Time  4481    PT Time Calculation (min)  49 min    Activity Tolerance  Patient tolerated treatment well;No increased pain    Behavior During Therapy  WFL for tasks assessed/performed       Past Medical History:  Diagnosis Date  . Anxiety   . Arthritis   . 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 Corneal perforation of left eye 10/09/2017   Overview:  Added automatically from request for surgery [id number]  . GERD (gastroesophageal reflux disease)    OCC  . Goiter   . Hematuria 10/21/2018  . Hyperthyroidism   . Personal history of radiation therapy   . Sjogren's syndrome [facility])     Past Surgical History:  Procedure Laterality Date  . BREAST BIOPSY Left 05/12/2017   [place] bx/ INVASIVE MAMMARY CARCINOMA  . BREAST LUMPECTOMY Left 05/2017   invasive mammary carcinoma, neg margins  . BREAST LUMPECTOMY WITH SENTINEL LYMPH NODE BIOPSY Left 06/10/2017   Procedure: BREAST LUMPECTOMY WITH SENTINEL LYMPH NODE BX AND NEEDLE LOCALIZATION;  Surgeon: Robert Bellow, MD;  Location: [facility] ORS;  Service: General;  Laterality: Left;  . BREAST MAMMOSITE Left 06/24/2017   Procedure: MAMMOSITE BREAST;  Surgeon: Robert Bellow, MD;  Location: [facility] ORS;  Service: General;  Laterality: Left;  . CESAREAN SECTION  1995  . 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 COSMETIC SURGERY    . CYST EXCISION  2018   pilar cyst/ Dr Will Bonnet  .  EYE SURGERY    . PORTACATH PLACEMENT Right 07/08/2017   Procedure: INSERTION PORT-A-CATH;  Surgeon: Robert Bellow, MD;  Location: [facility] ORS;  Service: General;  Laterality: Right;  . TONSILLECTOMY      There were no vitals filed for this visit.  Subjective Assessment - 03/03/19 1311    Subjective  Pt had one week without any pain at all. Urinary leakage is still better. Pt reports she needs to leave earlier to get to her next appt    Patient Stated Goals  reduce pain in back, pelvic pain and redue urinary leakage    Pain Onset  1 to 4 weeks ago         [facility] PT Assessment - 03/03/19 1348      Observation/Other Assessments   Observations  less forward head        Palpation   Spinal mobility  increased mm tightness at levator , serratus anterior on R T3 deviated                    [facility] Adult PT Treatment/Exercise - 03/03/19 1347      Neuro Re-ed    Neuro Re-ed Details   shouler ER/ scap retraction/ depression      Moist Heat Therapy   Number Minutes Moist Heat  5 Minutes  Moist Heat Location  --   midback/neck with guided relaxation     Manual Therapy   Manual therapy comments  inferior mob at scap spine, medial mob at T3 , STM at serratus anterior mm / medial scap R                PT Short Term Goals - 12/31/18 1543      PT SHORT TERM GOAL #2   Title  lumbar ROM improved by 25% due to increased tissue and spine mobility    Time  4    Period  Weeks    Status  Achieved        PT Long Term Goals - 02/24/19 1418      PT LONG TERM GOAL #1   Title  independent with HEP and understand how to progress herself    Baseline  not educated yet    Time  4    Period  Months    Status  On-going      PT LONG TERM GOAL #2   Title  ability to expand the abdomen due to improve lower rib mobility and tissue mobility to improve continence    Baseline  not able to expand abdomen and many trigger points in the abdomen    Time  4    Period  Months     Status  Achieved      PT LONG TERM GOAL #3   Title  pelvic floor strength >/= 3/5 holding for 10 seconds due to endurance and improved lengthening of the pelvic floor    Baseline  2/5    Time  4    Period  Months    Status  On-going      PT LONG TERM GOAL #4   Title  lumbar and lower abdominal pain decreased </= 1/10 50% of the time due to increased activity     Baseline  patient sits all day due to pain, pain level 2/10    Time  4    Period  Months    Status  On-going      PT LONG TERM GOAL #5   Title  Pt will demo no cues for scapular retraction/ depression in order to minimize shoulder IR/ upper trap overuse and decrease pelvic floor    Baseline  upper trap overuse    Time  4    Period  Weeks    Status  New    Target Date  03/24/19            Plan - 03/03/19 1350    Clinical Impression Statement Milestone improvement this week as pt reports she had no pain for the past week.  Pt demo'd signficantly less mm tensions in upper body following Tx which facilitated improved diaphragmatic breathing and less upper trap/ levator mm overuse. Pt's posture is more up right and pt is learning to break her habit of shoulder IR. Less forward head posture is helping pt with better intraabdominal pressure to minimize urinary leakage, decrease pelvic floor overactivity to decrease frequency, and decrease LBP and abdominal pain.  Pt continues to benefit from skilled PT.     Personal Factors and Comorbidities  Comorbidity 1;Comorbidity 2    Comorbidities  breast cancer with radiation, chemotherapy and presently taking Tomaxifin; Sjorgens syndrome    Examination-Activity Limitations  Locomotion Level;Continence    Examination-Participation Restrictions  Community Activity;Driving;Cleaning;Shop    Stability/Clinical Decision Making  Evolving/Moderate complexity    Rehab Potential  Excellent    PT Frequency  1x / week    PT Duration  Other (comment)   4 months   PT Treatment/Interventions   Biofeedback;Functional mobility training;Neuromuscular re-education;Therapeutic exercise;Therapeutic activities;Patient/family education;Manual techniques;Dry needling;Scar mobilization    PT Next Visit Plan  transfer to [place] PT pelvic floor due to closer to home, work on pelvic floor contraction, abdominal and c-section scar massage; abdominal strength    PT Home Exercise Plan  Access Code: [email or web address]    Consulted and Agree with Plan of Care  Patient       Patient will benefit from skilled therapeutic intervention in order to improve the following deficits and impairments:  Increased fascial restricitons, Pain, Decreased coordination, Decreased mobility, Decreased scar mobility, Increased muscle spasms, Decreased activity tolerance, Decreased range of motion, Decreased strength  Visit Diagnosis: 1. Acute bilateral low back pain without sciatica   2. Muscle weakness (generalized)   3. Urinary, incontinence, stress female   4. Lower abdominal pain           Problem List Patient Active Problem List   Diagnosis Date Noted  . Facial flushing 10/21/2018  . Tinnitus of both ears 10/21/2018  . Drug-induced neutropenia ([place]) 10/20/2018  . 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 Thrombocytosis ([place]) 10/20/2018  . 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 Irregular menstrual cycle 03/02/2018  . Screening for cervical cancer 03/02/2018  . Long-term current use of tamoxifen 02/17/2018  . 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 Anxiety 10/03/2017  . Bilateral primary osteoarthritis of knee 10/03/2017  . 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 Malignant neoplasm of upper-inner quadrant of left breast in female, estrogen receptor positive ([place]) 05/22/2017  . Hirsutism 11/24/2015  . Hyperlipidemia 02/01/2014  . Impaired glucose tolerance 02/01/2014  . Vitamin D deficiency 02/01/2014  . Family history of diabetes mellitus 01/31/2014   . Morbid obesity with BMI of 40.0-44.9, adult ([place]) 01/31/2014  . Sjogren's syndrome ([place]) 01/17/2014    Jerl Mina ,PT, DPT, E-RYT  03/03/2019, 1:55 PM  [place] MAIN [facility] SERVICES [address] [place], [place], [address] Phone: [phone]   Fax:  [phone]  Name: AERIE DONICA MRN: [id number] Date of Birth: 1961/04/23

## 2019-03-03 NOTE — Patient Instructions (Signed)
Palm up in seating posture, and end of open book  Continue with past exercises to improve posture and relaxation minimize overuse upper trap, use diaphragm and pelvic floor coordination

## 2019-03-04 ENCOUNTER — Other Ambulatory Visit: Payer: Self-pay | Admitting: *Deleted

## 2019-03-04 MED ORDER — TAMOXIFEN CITRATE 20 MG PO TABS: 20.0000 mg | ORAL_TABLET | Freq: Every day | ORAL | 1 refills | Status: DC

## 2019-03-09 ENCOUNTER — Encounter: Payer: Self-pay | Admitting: Family Medicine

## 2019-03-09 DIAGNOSIS — G4733 Obstructive sleep apnea (adult) (pediatric): Secondary | ICD-10-CM

## 2019-03-10 ENCOUNTER — Other Ambulatory Visit: Payer: Self-pay

## 2019-03-10 ENCOUNTER — Ambulatory Visit: Payer: Medicaid Other | Admitting: Physical Therapy

## 2019-03-10 DIAGNOSIS — M545 Low back pain, unspecified: Secondary | ICD-10-CM

## 2019-03-10 DIAGNOSIS — I890 Lymphedema, not elsewhere classified: Secondary | ICD-10-CM

## 2019-03-10 DIAGNOSIS — R1030 Lower abdominal pain, unspecified: Secondary | ICD-10-CM

## 2019-03-10 DIAGNOSIS — N393 Stress incontinence (female) (male): Secondary | ICD-10-CM

## 2019-03-10 DIAGNOSIS — M6281 Muscle weakness (generalized): Secondary | ICD-10-CM

## 2019-03-10 NOTE — Therapy (Signed)
[place] MAIN [facility] SERVICES [address] [place], [place], [address] Phone: [phone]   Fax:  [phone]  Physical Therapy Treatment  Patient Details  Name: Sheena Martinez MRN: [id number] Date of Birth: [birth year] Referring Provider (PT): Dr. Jed Limerick, NP   Encounter Date: 03/10/2019  PT End of Session - 03/10/19 1313    Visit Number  13    Date for PT Re-Evaluation  04/07/19    Authorization Type  Medicaid 04/07/19    PT Start Time  7416    PT Stop Time  1400    PT Time Calculation (min)  53 min    Activity Tolerance  Patient tolerated treatment well;No increased pain    Behavior During Therapy  WFL for tasks assessed/performed       Past Medical History:  Diagnosis Date  . Anxiety   . Arthritis   . 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 Corneal perforation of left eye 10/09/2017   Overview:  Added automatically from request for surgery [id number]  . GERD (gastroesophageal reflux disease)    OCC  . Goiter   . Hematuria 10/21/2018  . Hyperthyroidism   . Personal history of radiation therapy   . Sjogren's syndrome [facility])     Past Surgical History:  Procedure Laterality Date  . BREAST BIOPSY Left 05/12/2017   [place] bx/ INVASIVE MAMMARY CARCINOMA  . BREAST LUMPECTOMY Left 05/2017   invasive mammary carcinoma, neg margins  . BREAST LUMPECTOMY WITH SENTINEL LYMPH NODE BIOPSY Left 06/10/2017   Procedure: BREAST LUMPECTOMY WITH SENTINEL LYMPH NODE BX AND NEEDLE LOCALIZATION;  Surgeon: Robert Bellow, MD;  Location: [facility] ORS;  Service: General;  Laterality: Left;  . BREAST MAMMOSITE Left 06/24/2017   Procedure: MAMMOSITE BREAST;  Surgeon: Robert Bellow, MD;  Location: [facility] ORS;  Service: General;  Laterality: Left;  . CESAREAN SECTION  1995  . 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 COSMETIC SURGERY    . CYST EXCISION  2018   pilar cyst/ Dr Will Bonnet  .  EYE SURGERY    . PORTACATH PLACEMENT Right 07/08/2017   Procedure: INSERTION PORT-A-CATH;  Surgeon: Robert Bellow, MD;  Location: [facility] ORS;  Service: General;  Laterality: Right;  . TONSILLECTOMY      There were no vitals filed for this visit.  Subjective Assessment - 03/10/19 1309    Subjective  Pt reports she did most of her exercises. Pt is notices when she is stressed, she feels it in her R back and is able to relax. Urinary leakage and urgency are no longer a problem for the past month    Patient Stated Goals  reduce pain in back, pelvic pain and redue urinary leakage    Pain Onset  1 to 4 weeks ago         [facility] PT Assessment - 03/10/19 1405      Coordination   Gross Motor Movements are Fluid and Coordinated  --   minor cues for deep core 2      Palpation   Spinal mobility  hypomobile T3-7, parspinals B tight , upper trap decreased                    [facility] Adult PT Treatment/Exercise - 03/10/19 1404      Neuro Re-ed    Neuro Re-ed Details   cued for scap retraction/ depress with  T extension in prone CKC BUE        Moist Heat Therapy   Number Minutes Moist Heat  10 Minutes    Moist Heat Location  --   back during review of HEP      Manual Therapy   Manual therapy comments  PA mob at T3-7 R, STM paraspinal               PT Short Term Goals - 12/31/18 1543      PT SHORT TERM GOAL #2   Title  lumbar ROM improved by 25% due to increased tissue and spine mobility    Time  4    Period  Weeks    Status  Achieved        PT Long Term Goals - 03/10/19 1311      PT LONG TERM GOAL #1   Title  independent with HEP and understand how to progress herself    Baseline  not educated yet    Time  4    Period  Months    Status  On-going      PT LONG TERM GOAL #2   Title  ability to expand the abdomen due to improve lower rib mobility and tissue mobility to improve continence    Baseline  not able to expand abdomen and many trigger points in the  abdomen    Time  4    Period  Months    Status  Achieved      PT LONG TERM GOAL #3   Title  pelvic floor strength >/= 3/5 holding for 10 seconds due to endurance and improved lengthening of the pelvic floor    Baseline  2/5    Time  4    Period  Months    Status  Deferred      PT LONG TERM GOAL #4   Title  lumbar and lower abdominal pain decreased </= 1/10 50% of the time due to increased activity  (7/29: "it is rare to get LBP and no longer has the  abdominal pain"    Baseline  patient sits all day due to pain, pain level 2/10    Time  4    Period  Months    Status  Achieved      PT LONG TERM GOAL #5   Title  Pt will demo no cues for scapular retraction/ depression in order to minimize shoulder IR/ upper trap overuse and decrease pelvic floor    Baseline  upper trap overuse    Time  4    Period  Weeks    Status  On-going            Plan - 03/10/19 1406    Clinical Impression Statement  Pt demo'd decreased mm tightness but required manual Tx to icnrease thoracic extension/ scap/cervical retraction and scap depression. Pt demo'd increased mobility post Tx and her forward head posture is progressing signficantly. Pt is close to d/c with one more visit.    Personal Factors and Comorbidities  Comorbidity 1;Comorbidity 2    Comorbidities  breast cancer with radiation, chemotherapy and presently taking Tomaxifin; Sjorgens syndrome    Examination-Activity Limitations  Locomotion Level;Continence    Examination-Participation Restrictions  Community Activity;Driving;Cleaning;Shop    Stability/Clinical Decision Making  Evolving/Moderate complexity    Rehab Potential  Excellent    PT Frequency  1x / week    PT Duration  Other (comment)   4 months   PT Treatment/Interventions  Biofeedback;Functional mobility training;Neuromuscular re-education;Therapeutic exercise;Therapeutic activities;Patient/family education;Manual techniques;Dry needling;Scar mobilization    PT Next Visit Plan   transfer to [facility] [facility] pelvic floor due to closer to home, work on pelvic floor contraction, abdominal and c-section scar massage; abdominal strength    PT Home Exercise Plan  Access Code: [email or web address]    Consulted and Agree with Plan of Care  Patient       Patient will benefit from skilled therapeutic intervention in order to improve the following deficits and impairments:  Increased fascial restricitons, Pain, Decreased coordination, Decreased mobility, Decreased scar mobility, Increased muscle spasms, Decreased activity tolerance, Decreased range of motion, Decreased strength  Visit Diagnosis: 1. Acute bilateral low back pain without sciatica   2. Muscle weakness (generalized)   3. Urinary, incontinence, stress female   4. Lower abdominal pain   5. Lymphedema, not elsewhere classified        Problem List Patient Active Problem List   Diagnosis Date Noted  . Facial flushing 10/21/2018  . Tinnitus of both ears 10/21/2018  . Drug-induced neutropenia ([place]) 10/20/2018  . 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 Thrombocytosis ([place]) 10/20/2018  . 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 Irregular menstrual cycle 03/02/2018  . Screening for cervical cancer 03/02/2018  . Long-term current use of tamoxifen 02/17/2018  . 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 Anxiety 10/03/2017  . Bilateral primary osteoarthritis of knee 10/03/2017  . 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 Malignant neoplasm of upper-inner quadrant of left breast in female, estrogen receptor positive ([place]) 05/22/2017  . Hirsutism 11/24/2015  . Hyperlipidemia 02/01/2014  . Impaired glucose tolerance 02/01/2014  . Vitamin D deficiency 02/01/2014  . Family history of diabetes mellitus 01/31/2014  . Morbid obesity with BMI of 40.0-44.9, adult ([place]) 01/31/2014  . Sjogren's syndrome ([employer]) 01/17/2014    Jerl Mina ,PT, DPT, E-RYT  03/10/2019, 2:09  PM  [place] MAIN [facility] SERVICES [address] [place], [place], [address] Phone: [phone]   Fax:  [phone]  Name: MOZELL HABER MRN: [id number] Date of Birth: May 14, 1961

## 2019-03-11 ENCOUNTER — Other Ambulatory Visit: Payer: Self-pay

## 2019-03-11 ENCOUNTER — Ambulatory Visit (INDEPENDENT_AMBULATORY_CARE_PROVIDER_SITE_OTHER): Payer: Medicaid Other | Admitting: Obstetrics & Gynecology

## 2019-03-11 ENCOUNTER — Encounter: Payer: Self-pay | Admitting: Obstetrics & Gynecology

## 2019-03-11 VITALS — BP 120/80 | Ht 60.0 in | Wt 227.0 lb

## 2019-03-11 DIAGNOSIS — Z0000 Encounter for general adult medical examination without abnormal findings: Secondary | ICD-10-CM

## 2019-03-11 DIAGNOSIS — Z01419 Encounter for gynecological examination (general) (routine) without abnormal findings: Secondary | ICD-10-CM | POA: Diagnosis not present

## 2019-03-11 DIAGNOSIS — Z1211 Encounter for screening for malignant neoplasm of colon: Secondary | ICD-10-CM

## 2019-03-11 DIAGNOSIS — R232 Flushing: Secondary | ICD-10-CM

## 2019-03-11 NOTE — Progress Notes (Signed)
HPI:      [name] is a 58 y.o. [phone] who LMP was in the past, she presents today for her annual examination.  The patient has no complaints today.   Herlast pap: approximate date 2019 and was normal and last mammogram: approximate date 2019 and was normal.  The patient does perform self breast exams.  There is notable PERSONAL history of breast cancer. The patient is not taking hormone replacement therapy and she is taking TAMOXIFEN. Patient denies post-menopausal vaginal bleeding. She had periods up until last July, none since. EMB and PAP last year normal.  The patient has regular exercise: not asked. The patient denies current symptoms of depression.    GYN Hx: Last Colonoscopy: never; Last Cologuard:1 year ago. Normal.  Last DEXA: never ago.    PMHx: Past Medical History:  Diagnosis Date  . Anxiety   . Arthritis   . 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 Corneal perforation of left eye 10/09/2017   Overview:  Added automatically from request for surgery [id number]  . GERD (gastroesophageal reflux disease)    OCC  . Goiter   . Hematuria 10/21/2018  . Hyperthyroidism   . Personal history of radiation therapy   . Sjogren's syndrome [facility])    Past Surgical History:  Procedure Laterality Date  . BREAST BIOPSY Left 05/12/2017   [place] bx/ INVASIVE MAMMARY CARCINOMA  . BREAST LUMPECTOMY Left 05/2017   invasive mammary carcinoma, neg margins  . BREAST LUMPECTOMY WITH SENTINEL LYMPH NODE BIOPSY Left 06/10/2017   Procedure: BREAST LUMPECTOMY WITH SENTINEL LYMPH NODE BX AND NEEDLE LOCALIZATION;  Surgeon:  Bellow, MD;  Location: [facility] ORS;  Service: General;  Laterality: Left;  . BREAST MAMMOSITE Left 06/24/2017   Procedure: MAMMOSITE BREAST;  Surgeon:  Bellow, MD;  Location: [facility] ORS;  Service: General;  Laterality: Left;  . CESAREAN SECTION  1995  . 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  COSMETIC SURGERY    . CYST EXCISION  2018   pilar cyst/ Dr Will Bonnet  . EYE SURGERY    . PORTACATH PLACEMENT Right 07/08/2017   Procedure: INSERTION PORT-A-CATH;  Surgeon:  Bellow, MD;  Location: [facility] ORS;  Service: General;  Laterality: Right;  . TONSILLECTOMY     Family History  Problem Relation Age of Onset  . Breast cancer Maternal Aunt 79       currently ~65  . Diabetes Mother   . Skin cancer Mother        currently 57; TAH/BSO (age?)  . Anemia Mother   . Liver disease Father        deceased / not much info about him / alcoholic  . Lung cancer Paternal Aunt        smoker / deceased 28s  . Stomach cancer Paternal Uncle 83       deceased / deceased 28s  . Ovarian cancer Maternal Grandmother 59       deceased 29s  . Stomach cancer Paternal Grandfather 63       deceased 23s  . Cancer Maternal Uncle        unk. type; deceased 33s  . Leukemia Cousin 63       deceased 9   Social History   Tobacco Use  . Smoking status: Former Smoker    Years: 7.00    Types: Cigarettes    Quit date: 08/13/1979  Years since quitting: 39.6  . Smokeless tobacco: Never Used  . Tobacco comment: AGE [age]  Substance Use Topics  . Alcohol use: No  . Drug use: No    Current Outpatient Medications:  .  Ca Carbonate-Mag Hydroxide (ROLAIDS PO), Take 1 tablet as needed by mouth (indigestion). , Disp: , Rfl:  .  carboxymethylcellulose 1 % ophthalmic solution, Place 1 drop into the left eye 4 (four) times daily., Disp: , Rfl:  .  cholecalciferol (VITAMIN D) 1000 units tablet, Take 2,000 Units by mouth daily., Disp: , Rfl:  .  cycloSPORINE (RESTASIS) 0.05 % ophthalmic emulsion, INSTILL 1 DROP INTO EACH EYE TWICE DAILY, Disp: , Rfl:  .  ketoconazole (NIZORAL) 2 % shampoo, Apply 1 application topically 2 (two) times a week., Disp: 120 mL, Rfl: 3 .  LORazepam (ATIVAN) 0.5 MG tablet, , Disp: , Rfl: 0 .  meloxicam (MOBIC) 15 MG tablet, Take 0.5 tablets (7.5 mg total) by mouth daily., Disp: 90  tablet, Rfl: 0 .  methimazole (TAPAZOLE) 10 MG tablet, TAKE 1 TABLET BY MOUTH ONCE DAILY AT 6AM, Disp: , Rfl: 2 .  sertraline (ZOLOFT) 100 MG tablet, Take 150 mg by mouth daily., Disp: , Rfl: 1 .  tamoxifen (NOLVADEX) 20 MG tablet, Take 1 tablet (20 mg total) by mouth daily., Disp: 30 tablet, Rfl: 1 .  Timolol Maleate 0.5 % (DAILY) SOLN, Place 1 drop into the left eye 2 (two) times daily., Disp: , Rfl:  .  venlafaxine XR (EFFEXOR-XR) 37.5 MG 24 hr capsule, Take 1 capsule (37.5 mg) daily in the morning for 1 week, then increase to 2 capsules (75 mg) daily in the morning., Disp: , Rfl:  .  vitamin k 100 MCG tablet, Take 100 mcg by mouth 2 (two) times a week., Disp: , Rfl:  .  acetaminophen (TYLENOL) 500 MG tablet, Take 1,000 mg by mouth every 8 (eight) hours as needed for mild pain or headache., Disp: , Rfl:  .  brimonidine (ALPHAGAN) 0.2 % ophthalmic solution, INSTILL 1 DROP INTO LEFT EYE THREE TIMES DAILY, Disp: , Rfl: 12 .  oxybutynin (DITROPAN XL) 10 MG 24 hr tablet, Take 10 mg by mouth at bedtime., Disp: , Rfl:  .  prednisoLONE acetate (PRED FORTE) 1 % ophthalmic suspension, , Disp: , Rfl: 1 .  tizanidine (ZANAFLEX) 2 MG capsule, Take 1 capsule (2 mg total) by mouth 3 (three) times daily., Disp: 30 capsule, Rfl: 1 .  VESICARE 5 MG tablet, Take 5 mg by mouth daily., Disp: , Rfl:  .  vitamin C (ASCORBIC ACID) 500 MG tablet, Take 500 mg by mouth daily., Disp: , Rfl:  Allergies: Rituximab, Benadryl [diphenhydramine], Diphenhydramine hcl, Clonazepam, and Sulfamethoxazole-trimethoprim  Review of Systems  Constitutional: Negative for chills, fever and malaise/fatigue.  HENT: Negative for congestion, sinus pain and sore throat.   Eyes: Negative for blurred vision and pain.  Respiratory: Negative for cough and wheezing.   Cardiovascular: Negative for chest pain and leg swelling.  Gastrointestinal: Negative for abdominal pain, constipation, diarrhea, heartburn, nausea and vomiting.  Genitourinary:  Negative for dysuria, frequency, hematuria and urgency.  Musculoskeletal: Negative for back pain, joint pain, myalgias and neck pain.  Skin: Negative for itching and rash.  Neurological: Negative for dizziness, tremors and weakness.  Endo/Heme/Allergies: Does not bruise/bleed easily.  Psychiatric/Behavioral: Negative for depression. The patient is nervous/anxious. The patient does not have insomnia.     Objective: BP 120/80   Ht 5' (1.524 m)   Wt 227 lb (103  kg)   BMI 44.33 kg/m   Filed Weights   03/11/19 1322  Weight: 227 lb (103 kg)   Body mass index is 44.33 kg/m. Physical Exam Constitutional:      General: She is not in acute distress.    Appearance: She is well-developed.  Genitourinary:     Pelvic exam was performed with patient supine.     Vagina, uterus and rectum normal.     No lesions in the vagina.     No vaginal bleeding.     No cervical motion tenderness, friability, lesion or polyp.     Uterus is mobile.     Uterus is not enlarged.     No uterine mass detected.    Uterus is midaxial.     No right or left adnexal mass present.     Right adnexa not tender.     Left adnexa not tender.  HENT:     Head: Normocephalic and atraumatic. No laceration.     Right Ear: Hearing normal.     Left Ear: Hearing normal.     Mouth/Throat:     Pharynx: Uvula midline.  Eyes:     Pupils: Pupils are equal, round, and reactive to light.  Neck:     Musculoskeletal: Normal range of motion and neck supple.     Thyroid: No thyromegaly.  Cardiovascular:     Rate and Rhythm: Normal rate and regular rhythm.     Heart sounds: No murmur. No friction rub. No gallop.   Pulmonary:     Effort: Pulmonary effort is normal. No respiratory distress.     Breath sounds: Normal breath sounds. No wheezing.  Chest:     Breasts:        Right: No mass, skin change or tenderness.        Left: No mass, skin change or tenderness.  Abdominal:     General: Bowel sounds are normal. There is no  distension.     Palpations: Abdomen is soft.     Tenderness: There is no abdominal tenderness. There is no rebound.  Musculoskeletal: Normal range of motion.  Neurological:     Mental Status: She is alert and oriented to person, place, and time.     Cranial Nerves: No cranial nerve deficit.  Skin:    General: Skin is warm and dry.  Psychiatric:        Judgment: Judgment normal.  Vitals signs reviewed.     Assessment: Annual Exam 1. Women's annual routine gynecological examination   2. Screen for colon cancer   3. Hot flashes     Plan:            1.  Cervical Screening-  Pap smear schedule reviewed with patient, due 2022  2. Breast screening- Exam annually and mammogram scheduled (Sept 2020) Continues w Tamoxifen plan for many years  3. Colonoscopy vs repeat Cologuard in years, Hemoccult testing after age 89  4. Labs managed by PCP  5. Counseling for hormonal therapy: none and never Counseled as to tx options for hot flashes and menopause.  To try Black Cohash first.              6. FRAX - FRAX score for assessing the 10 year probability for fracture calculated and discussed today.  Based on age and score today, DEXA is not currently scheduled.    F/U  Return in about 1 year (around 03/10/2020) for Annual.  Barnett Applebaum, MD, [facility], [facility] 03/11/2019  2:02 PM

## 2019-03-11 NOTE — Patient Instructions (Addendum)
PAP every three years Mammogram every year    As scheduled Cologuard vs Colonoscopy Labs yearly (with PCP)  Black Cohosh, Cimicifuga racemosa oral dosage forms  What is this medicine? BLACK COHOSH (blak KOH hosh) or Cimicifuga racemosa is a dietary supplement. It is promoted to relieve symptoms of menopause, such as hot flashes. This supplement may be used for other purposes; ask your health care provider or pharmacist if you have questions. What should I tell my health care provider before I take this medicine? They need to know if you have any of these conditions:  breast cancer  cervical, ovarian or uterine cancer  high blood pressure  infertility  liver disease  menstrual changes or irregular periods  unusual vaginal or uterine bleeding  an unusual or allergic reaction to black cohosh, soybeans, tartrazine dye (yellow dye number 5), other medicines, foods, dyes, or preservatives  pregnant or trying to get pregnant  breast-feeding How should I use this medicine? Take this herb by mouth with a glass of water. Follow the directions on the package labeling, or talk to your health care professional. Do not use for longer than 6 months without the advice of a health care professional. Do not use if you are pregnant or breast-feeding. Talk to your obstetrician-gynecologist or certified nurse-midwife. This herb is not for use in children under the age of 63 years. Overdosage: If you think you have taken too much of this medicine contact a poison control center or emergency room at once. NOTE: This medicine is only for you. Do not share this medicine with others. What if I miss a dose? If you miss a dose, take it as soon as you can. If it is almost time for your next dose, take only that dose. Do not take double or extra doses. What may interact with this medicine?  atorvastatin  cisplatin  fertility treatments This list may not describe all possible interactions. Give your  health care provider a list of all the medicines, herbs, non-prescription drugs, or dietary supplements you use. Also tell them if you smoke, drink alcohol, or use illegal drugs. Some items may interact with your medicine. What should I watch for while using this medicine? Since this herb is derived from a plant, allergic reactions are possible. Stop using this herb if you develop a rash. You may need to see your health care professional, or inform them that this occurred. Report any unusual side effects promptly. If you are taking this herb for menstrual or menopausal symptoms, visit your doctor or health care professional for regular checks on your progress. You should have a complete check-up every 6 months. You will need a regular breast and pelvic exam while on this therapy. Follow the advice of your doctor or health care professional. Women should inform their doctor if they wish to become pregnant or think they might be pregnant. If you have any reason to think you are pregnant, stop taking this herb at once and contact your doctor or health care professional. Herbal or dietary supplements are not regulated like medicines. Rigid quality control standards are not required for dietary supplements. The purity and strength of these products can vary. The safety and effect of this dietary supplement for a certain disease or illness is not well known. This product is not intended to diagnose, treat, cure or prevent any disease. The Food and Drug Administration suggests the following to help consumers protect themselves:  Always read product labels and follow directions.  Natural  does not mean a product is safe for humans to take.  Look for products that include USP after the ingredient name. This means that the manufacturer followed the standards of the U.S. Pharmacopoeia.  Supplements made or sold by a nationally known food or drug company are more likely to be made under tight controls. You can  write to the company for more information about how the product was made. What side effects may I notice from receiving this medicine? Side effects that you should report to your doctor or health care professional as soon as possible:  allergic reactions like skin rash, itching or hives, swelling of the face, lips, or tongue  breathing problems  dizziness  palpitations  signs and symptoms of liver injury like dark yellow or brown urine; general ill feeling or flu-like symptoms; light-colored stools; loss of appetite; nausea; right upper belly pain; unusually weak or tired; yellowing of the eyes or skin  unusual vaginal bleeding Side effects that usually do not require medical attention (report to your doctor or health care professional if they continue or are bothersome):  breast tenderness  headache  nausea  upset stomach This list may not describe all possible side effects. Call your doctor for medical advice about side effects. You may report side effects to FDA at 1-800-FDA-1088. Where should I keep my medicine? Keep out of the reach of children. Store at room temperature between 15 and 30 degrees C (59 and 86 degrees C). Throw away any unused herb after the expiration date. NOTE: This sheet is a summary. It may not cover all possible information. If you have questions about this medicine, talk to your doctor, pharmacist, or health care provider.  2020 Elsevier/Gold Standard (2016-02-07 14:35:09)

## 2019-03-12 ENCOUNTER — Telehealth: Payer: Self-pay

## 2019-03-12 DIAGNOSIS — G4733 Obstructive sleep apnea (adult) (pediatric): Secondary | ICD-10-CM

## 2019-03-12 NOTE — Telephone Encounter (Signed)
No answer. LMOM for pt to  Return re sleep study results and need for Sleep study with CPAP titration.

## 2019-03-12 NOTE — Telephone Encounter (Signed)
Pt Notified that she has severe OSA and it is recommended that she undergo a sleep study with cpap titration. Pt verbalized understanding and wishes to proceed. Order placed. No further actions necessary at this time.

## 2019-03-15 ENCOUNTER — Ambulatory Visit
Admission: RE | Admit: 2019-03-15 | Discharge: 2019-03-15 | Disposition: A | Discharge status: Home / Self Care | Payer: Medicaid Other | Source: Ambulatory Visit | Attending: Family Medicine | Admitting: Family Medicine

## 2019-03-15 ENCOUNTER — Encounter: Payer: Self-pay | Admitting: Radiology

## 2019-03-15 DIAGNOSIS — Z13820 Encounter for screening for osteoporosis: Secondary | ICD-10-CM | POA: Diagnosis not present

## 2019-03-15 DIAGNOSIS — Z780 Asymptomatic menopausal state: Secondary | ICD-10-CM | POA: Diagnosis not present

## 2019-03-15 DIAGNOSIS — M85852 Other specified disorders of bone density and structure, left thigh: Secondary | ICD-10-CM | POA: Diagnosis not present

## 2019-03-16 ENCOUNTER — Other Ambulatory Visit: Payer: Self-pay

## 2019-03-16 DIAGNOSIS — H9319 Tinnitus, unspecified ear: Secondary | ICD-10-CM | POA: Diagnosis not present

## 2019-03-16 DIAGNOSIS — F410 Panic disorder [episodic paroxysmal anxiety] without agoraphobia: Secondary | ICD-10-CM | POA: Diagnosis not present

## 2019-03-16 DIAGNOSIS — F419 Anxiety disorder, unspecified: Secondary | ICD-10-CM | POA: Diagnosis not present

## 2019-03-16 DIAGNOSIS — G4700 Insomnia, unspecified: Secondary | ICD-10-CM | POA: Diagnosis not present

## 2019-03-17 ENCOUNTER — Encounter: Payer: Medicaid Other | Admitting: Physical Therapy

## 2019-03-17 ENCOUNTER — Inpatient Hospital Stay: Payer: Medicaid Other | Attending: Oncology

## 2019-03-17 ENCOUNTER — Other Ambulatory Visit: Payer: Self-pay

## 2019-03-17 DIAGNOSIS — Z801 Family history of malignant neoplasm of trachea, bronchus and lung: Secondary | ICD-10-CM | POA: Insufficient documentation

## 2019-03-17 DIAGNOSIS — Z79818 Long term (current) use of other agents affecting estrogen receptors and estrogen levels: Secondary | ICD-10-CM | POA: Insufficient documentation

## 2019-03-17 DIAGNOSIS — Z808 Family history of malignant neoplasm of other organs or systems: Secondary | ICD-10-CM | POA: Insufficient documentation

## 2019-03-17 DIAGNOSIS — Z803 Family history of malignant neoplasm of breast: Secondary | ICD-10-CM | POA: Insufficient documentation

## 2019-03-17 DIAGNOSIS — Z800 Family history of malignant neoplasm of digestive organs: Secondary | ICD-10-CM | POA: Diagnosis not present

## 2019-03-17 DIAGNOSIS — H16002 Unspecified corneal ulcer, left eye: Secondary | ICD-10-CM | POA: Diagnosis not present

## 2019-03-17 DIAGNOSIS — Z79899 Other long term (current) drug therapy: Secondary | ICD-10-CM | POA: Insufficient documentation

## 2019-03-17 DIAGNOSIS — R5383 Other fatigue: Secondary | ICD-10-CM | POA: Insufficient documentation

## 2019-03-17 DIAGNOSIS — C50512 Malignant neoplasm of lower-outer quadrant of left female breast: Secondary | ICD-10-CM | POA: Diagnosis not present

## 2019-03-17 DIAGNOSIS — Z806 Family history of leukemia: Secondary | ICD-10-CM | POA: Diagnosis not present

## 2019-03-17 DIAGNOSIS — Z87891 Personal history of nicotine dependence: Secondary | ICD-10-CM | POA: Insufficient documentation

## 2019-03-17 DIAGNOSIS — F411 Generalized anxiety disorder: Secondary | ICD-10-CM | POA: Diagnosis not present

## 2019-03-17 DIAGNOSIS — E0590 Thyrotoxicosis, unspecified without thyrotoxic crisis or storm: Secondary | ICD-10-CM | POA: Diagnosis not present

## 2019-03-17 DIAGNOSIS — Z95828 Presence of other vascular implants and grafts: Secondary | ICD-10-CM

## 2019-03-17 DIAGNOSIS — Z170 Estrogen receptor positive status [ER+]: Secondary | ICD-10-CM | POA: Insufficient documentation

## 2019-03-17 DIAGNOSIS — Z8041 Family history of malignant neoplasm of ovary: Secondary | ICD-10-CM | POA: Diagnosis not present

## 2019-03-17 DIAGNOSIS — Z833 Family history of diabetes mellitus: Secondary | ICD-10-CM | POA: Diagnosis not present

## 2019-03-17 DIAGNOSIS — Z8379 Family history of other diseases of the digestive system: Secondary | ICD-10-CM | POA: Diagnosis not present

## 2019-03-17 DIAGNOSIS — Z809 Family history of malignant neoplasm, unspecified: Secondary | ICD-10-CM | POA: Insufficient documentation

## 2019-03-17 DIAGNOSIS — C50212 Malignant neoplasm of upper-inner quadrant of left female breast: Secondary | ICD-10-CM | POA: Diagnosis present

## 2019-03-17 MED ORDER — SODIUM CHLORIDE 0.9% FLUSH
10.0000 mL | Freq: Once | INTRAVENOUS | Status: AC
  Administered 2019-03-17: 10 mL via INTRAVENOUS
  Filled 2019-03-17: qty 10

## 2019-03-17 MED ORDER — HEPARIN SOD (PORK) LOCK FLUSH 100 UNIT/ML IV SOLN
500.0000 [IU] | Freq: Once | INTRAVENOUS | Status: AC
  Administered 2019-03-17: 500 [IU] via INTRAVENOUS

## 2019-03-18 ENCOUNTER — Encounter: Payer: Self-pay | Admitting: Family Medicine

## 2019-03-18 DIAGNOSIS — M8580 Other specified disorders of bone density and structure, unspecified site: Secondary | ICD-10-CM | POA: Insufficient documentation

## 2019-03-19 ENCOUNTER — Other Ambulatory Visit: Payer: Self-pay

## 2019-03-19 ENCOUNTER — Encounter: Payer: Self-pay | Admitting: Internal Medicine

## 2019-03-19 DIAGNOSIS — G4733 Obstructive sleep apnea (adult) (pediatric): Secondary | ICD-10-CM

## 2019-03-23 ENCOUNTER — Other Ambulatory Visit
Admission: RE | Admit: 2019-03-23 | Discharge: 2019-03-23 | Disposition: A | Discharge status: Home / Self Care | Payer: Medicaid Other | Source: Ambulatory Visit | Attending: Internal Medicine | Admitting: Internal Medicine

## 2019-03-23 ENCOUNTER — Other Ambulatory Visit: Payer: Self-pay

## 2019-03-23 DIAGNOSIS — Z01812 Encounter for preprocedural laboratory examination: Secondary | ICD-10-CM | POA: Diagnosis not present

## 2019-03-23 DIAGNOSIS — Z20828 Contact with and (suspected) exposure to other viral communicable diseases: Secondary | ICD-10-CM | POA: Insufficient documentation

## 2019-03-24 ENCOUNTER — Ambulatory Visit: Payer: Medicaid Other | Attending: Family Medicine | Admitting: Physical Therapy

## 2019-03-24 DIAGNOSIS — H16002 Unspecified corneal ulcer, left eye: Secondary | ICD-10-CM | POA: Diagnosis not present

## 2019-03-24 DIAGNOSIS — M6281 Muscle weakness (generalized): Secondary | ICD-10-CM

## 2019-03-24 DIAGNOSIS — R1030 Lower abdominal pain, unspecified: Secondary | ICD-10-CM | POA: Diagnosis not present

## 2019-03-24 DIAGNOSIS — M545 Low back pain, unspecified: Secondary | ICD-10-CM

## 2019-03-24 DIAGNOSIS — R2689 Other abnormalities of gait and mobility: Secondary | ICD-10-CM

## 2019-03-24 DIAGNOSIS — N393 Stress incontinence (female) (male): Secondary | ICD-10-CM

## 2019-03-24 LAB — SARS CORONAVIRUS 2 (TAT 6-24 HRS): SARS Coronavirus 2: NEGATIVE

## 2019-03-24 NOTE — Therapy (Signed)
[place] MAIN [facility] SERVICES [address] [place], [place], [address] Phone: [phone]   Fax:  [phone]  Physical Therapy Treatment / Discharge Summary   Patient Details  Name: Sheena Martinez MRN: [id number] Date of Birth: 07-16-1961 Referring Provider (PT): Dr. Jed Limerick, NP   Encounter Date: 03/24/2019  PT End of Session - 03/24/19 2134    Visit Number  14   Date for PT Re-Evaluation  04/07/19    Authorization Type  Medicaid 04/07/19    PT Start Time  1300    PT Stop Time  1400    PT Time Calculation (min)  60 min    Activity Tolerance  Patient tolerated treatment well;No increased pain    Behavior During Therapy  WFL for tasks assessed/performed       Past Medical History:  Diagnosis Date  . Anxiety   . Arthritis   . 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 Corneal perforation of left eye 10/09/2017   Overview:  Added automatically from request for surgery [id number]  . GERD (gastroesophageal reflux disease)    OCC  . Goiter   . Hematuria 10/21/2018  . Hyperthyroidism   . Personal history of radiation therapy   . Sjogren's syndrome [facility])     Past Surgical History:  Procedure Laterality Date  . BREAST BIOPSY Left 05/12/2017   [place] bx/ INVASIVE MAMMARY CARCINOMA  . BREAST LUMPECTOMY Left 05/2017   invasive mammary carcinoma, neg margins  . BREAST LUMPECTOMY WITH SENTINEL LYMPH NODE BIOPSY Left 06/10/2017   Procedure: BREAST LUMPECTOMY WITH SENTINEL LYMPH NODE BX AND NEEDLE LOCALIZATION;  Surgeon: Robert Bellow, MD;  Location: [facility] ORS;  Service: General;  Laterality: Left;  . BREAST MAMMOSITE Left 06/24/2017   Procedure: MAMMOSITE BREAST;  Surgeon: Robert Bellow, MD;  Location: [facility] ORS;  Service: General;  Laterality: Left;  . CESAREAN SECTION  1995  . 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 COSMETIC SURGERY    . CYST EXCISION  2018   pilar  cyst/ Dr Will Bonnet  . EYE SURGERY    . PORTACATH PLACEMENT Right 07/08/2017   Procedure: INSERTION PORT-A-CATH;  Surgeon: Robert Bellow, MD;  Location: [facility] ORS;  Service: General;  Laterality: Right;  . TONSILLECTOMY      There were no vitals filed for this visit.  Subjective Assessment - 03/24/19 1324    Subjective  Pt performed her exercises when she was in her recliner, bed, and kitchen sink. Pt 's sleep study came back with a Dx of "Severe OSA" and will be trying out a CPAP this Friday.    Patient Stated Goals  reduce pain in back, pelvic pain and redue urinary leakage    Pain Onset  1 to 4 weeks ago         [facility] PT Assessment - 03/24/19 2156      Observation/Other Assessments   Observations  significantly decreased thoracic kyphosis, relaxed shoulders,       Palpation   Palpation comment  signficantly decreased upper trap / back mm. minor tightness at medial scap                    [facility] Adult PT Treatment/Exercise - 03/24/19 2139      Therapeutic Activites    ADL's  motivational interviewing to build compliance to HEP , collaborated strategies with patient , discussed d/c  Manual Therapy   Manual therapy comments  STM at medial scap mm B                PT Short Term Goals - 12/31/18 1543      PT SHORT TERM GOAL #2   Title  lumbar ROM improved by 25% due to increased tissue and spine mobility    Time  4    Period  Weeks    Status  Achieved        PT Long Term Goals - 03/24/19 2135      PT LONG TERM GOAL #1   Title  independent with HEP and understand how to progress herself    Baseline  not educated yet    Time  4    Period  Months    Status  Achieved      PT LONG TERM GOAL #2   Title  ability to expand the abdomen due to improve lower rib mobility and tissue mobility to improve continence    Baseline  not able to expand abdomen and many trigger points in the abdomen    Time  4    Period  Months    Status  Achieved       PT LONG TERM GOAL #3   Title  pelvic floor strength >/= 3/5 holding for 10 seconds due to endurance and improved lengthening of the pelvic floor    Baseline  2/5    Time  4    Period  Months    Status  Deferred      PT LONG TERM GOAL #4   Title  lumbar and lower abdominal pain decreased </= 1/10 50% of the time due to increased activity  (7/29: "it is rare to get LBP and no longer has the  abdominal pain"    Baseline  patient sits all day due to pain, pain level 2/10    Time  4    Period  Months    Status  Achieved      PT LONG TERM GOAL #5   Title  Pt will demo no cues for scapular retraction/ depression in order to minimize shoulder IR/ upper trap overuse and decrease pelvic floor    Baseline  upper trap overuse    Time  4    Period  Weeks    Status  Achieved            Plan - 03/24/19 2135    Clinical Impression Statement Pt achieved 4/4 goals with resolved abdominal/ lumbar pain and urinary incontinence. Pt has demo'd significantly decreased mm tightness at pelvic floor, back, neck, shoulder muscles and has been compliant with stretches, relaxation, and deep core strengthening. Pt reports her Sx have improved " A very great deal better" based on the [facility].  Pt has been tested for OSA as recommended by DPT. Pt will be using a CPAP after her follow up visit this week. Pt is ready for d/c at this time.      Personal Factors and Comorbidities  Comorbidity 1;Comorbidity 2    Comorbidities  breast cancer with radiation, chemotherapy and presently taking Tomaxifin; Sjorgens syndrome    Examination-Activity Limitations  Locomotion Level;Continence    Examination-Participation Restrictions  Community Activity;Driving;Cleaning;Shop    Stability/Clinical Decision Making  Evolving/Moderate complexity    Rehab Potential  Excellent    PT Frequency  1x / week    PT Duration  Other (comment)   4 months   PT Treatment/Interventions  Biofeedback;Functional mobility training;Neuromuscular  re-education;Therapeutic exercise;Therapeutic activities;Patient/family education;Manual techniques;Dry needling;Scar mobilization    PT Next Visit Plan  transfer to [place] [facility] pelvic floor due to closer to home, work on pelvic floor contraction, abdominal and c-section scar massage; abdominal strength    PT Home Exercise Plan  Access Code: [email or web address]    Consulted and Agree with Plan of Care  Patient       Patient will benefit from skilled therapeutic intervention in order to improve the following deficits and impairments:  Increased fascial restricitons, Pain, Decreased coordination, Decreased mobility, Decreased scar mobility, Increased muscle spasms, Decreased activity tolerance, Decreased range of motion, Decreased strength  Visit Diagnosis: 1. Acute bilateral low back pain without sciatica   2. Other abnormalities of gait and mobility   3. Muscle weakness (generalized)   4. Urinary, incontinence, stress female   5. Lower abdominal pain        Problem List Patient Active Problem List   Diagnosis Date Noted  . Osteopenia 03/18/2019  . Facial flushing 10/21/2018  . Tinnitus of both ears 10/21/2018  . Drug-induced neutropenia ([place]) 10/20/2018  . 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 Thrombocytosis ([place]) 10/20/2018  . 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 Irregular menstrual cycle 03/02/2018  . Screening for cervical cancer 03/02/2018  . Long-term current use of tamoxifen 02/17/2018  . 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 Anxiety 10/03/2017  . Bilateral primary osteoarthritis of knee 10/03/2017  . 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 Malignant neoplasm of upper-inner quadrant of left breast in female, estrogen receptor positive ([place]) 05/22/2017  . Hirsutism 11/24/2015  . Hyperlipidemia 02/01/2014  . Impaired glucose tolerance 02/01/2014  . Vitamin D deficiency 02/01/2014  . Family history of diabetes  mellitus 01/31/2014  . Morbid obesity with BMI of 40.0-44.9, adult ([place]) 01/31/2014  . Sjogren's syndrome ([place]) 01/17/2014    Jerl Mina ,PT, DPT, E-RYT  03/24/2019, 9:57 PM  [place] MAIN [facility] SERVICES [address] [place], [place], [address] Phone: [phone]   Fax:  [phone]  Name: Sheena Martinez MRN: [id number] Date of Birth: Jan 14, 1961

## 2019-03-26 ENCOUNTER — Ambulatory Visit: Payer: Medicaid Other | Attending: Internal Medicine

## 2019-03-26 DIAGNOSIS — G4733 Obstructive sleep apnea (adult) (pediatric): Secondary | ICD-10-CM | POA: Insufficient documentation

## 2019-03-26 DIAGNOSIS — G4710 Hypersomnia, unspecified: Secondary | ICD-10-CM | POA: Diagnosis present

## 2019-03-29 ENCOUNTER — Other Ambulatory Visit: Payer: Self-pay

## 2019-03-30 DIAGNOSIS — G4733 Obstructive sleep apnea (adult) (pediatric): Secondary | ICD-10-CM | POA: Diagnosis not present

## 2019-03-31 ENCOUNTER — Ambulatory Visit: Payer: Medicaid Other | Admitting: Physical Therapy

## 2019-04-05 DIAGNOSIS — E0520 Thyrotoxicosis with toxic multinodular goiter without thyrotoxic crisis or storm: Secondary | ICD-10-CM | POA: Diagnosis not present

## 2019-04-07 ENCOUNTER — Encounter: Payer: Medicaid Other | Admitting: Physical Therapy

## 2019-04-13 ENCOUNTER — Encounter: Payer: Self-pay | Admitting: Oncology

## 2019-04-13 ENCOUNTER — Encounter: Payer: Self-pay | Admitting: Family Medicine

## 2019-04-13 DIAGNOSIS — F419 Anxiety disorder, unspecified: Secondary | ICD-10-CM | POA: Diagnosis not present

## 2019-04-13 DIAGNOSIS — M3500 Sicca syndrome, unspecified: Secondary | ICD-10-CM

## 2019-04-13 DIAGNOSIS — G4700 Insomnia, unspecified: Secondary | ICD-10-CM | POA: Diagnosis not present

## 2019-04-13 DIAGNOSIS — E0590 Thyrotoxicosis, unspecified without thyrotoxic crisis or storm: Secondary | ICD-10-CM | POA: Diagnosis not present

## 2019-04-13 DIAGNOSIS — H9319 Tinnitus, unspecified ear: Secondary | ICD-10-CM | POA: Diagnosis not present

## 2019-04-15 DIAGNOSIS — M13871 Other specified arthritis, right ankle and foot: Secondary | ICD-10-CM | POA: Diagnosis not present

## 2019-04-15 DIAGNOSIS — M1711 Unilateral primary osteoarthritis, right knee: Secondary | ICD-10-CM | POA: Diagnosis not present

## 2019-04-15 DIAGNOSIS — M170 Bilateral primary osteoarthritis of knee: Secondary | ICD-10-CM | POA: Diagnosis not present

## 2019-04-16 DIAGNOSIS — H02054 Trichiasis without entropian left upper eyelid: Secondary | ICD-10-CM | POA: Diagnosis not present

## 2019-04-21 ENCOUNTER — Telehealth: Payer: Self-pay | Admitting: Internal Medicine

## 2019-04-21 DIAGNOSIS — G4733 Obstructive sleep apnea (adult) (pediatric): Secondary | ICD-10-CM

## 2019-04-21 NOTE — Telephone Encounter (Signed)
Pt is requesting sleep study results.   DR please advise. Thanks

## 2019-04-21 NOTE — Telephone Encounter (Signed)
Study was read a few weeks ago, I am not able to print results from their system. Please call sleep med to fax results.

## 2019-04-22 NOTE — Telephone Encounter (Signed)
Sleep study has been placed in DR's folder for review.

## 2019-04-22 NOTE — Telephone Encounter (Signed)
Pt is aware of results and voiced her understanding.  Pt wished to proceed with cpap. Order has been placed. Nothing further is needed.

## 2019-04-22 NOTE — Telephone Encounter (Signed)
Sleep study confirmed OSA.  Recommend educate pt in sleep hygiene measures, maintain lean body weight, and auto cpap 15-20cm h2O with small F20 FF mask. Left message to relay results.

## 2019-04-26 DIAGNOSIS — Z947 Corneal transplant status: Secondary | ICD-10-CM | POA: Diagnosis not present

## 2019-04-26 DIAGNOSIS — M3500 Sicca syndrome, unspecified: Secondary | ICD-10-CM | POA: Diagnosis not present

## 2019-04-26 DIAGNOSIS — H16002 Unspecified corneal ulcer, left eye: Secondary | ICD-10-CM | POA: Diagnosis not present

## 2019-04-27 ENCOUNTER — Other Ambulatory Visit: Payer: Self-pay | Admitting: Oncology

## 2019-04-27 DIAGNOSIS — M79671 Pain in right foot: Secondary | ICD-10-CM | POA: Diagnosis not present

## 2019-04-27 DIAGNOSIS — M170 Bilateral primary osteoarthritis of knee: Secondary | ICD-10-CM | POA: Diagnosis not present

## 2019-04-28 ENCOUNTER — Inpatient Hospital Stay: Payer: Medicaid Other

## 2019-04-29 DIAGNOSIS — G4733 Obstructive sleep apnea (adult) (pediatric): Secondary | ICD-10-CM | POA: Diagnosis not present

## 2019-04-29 LAB — FECAL OCCULT BLOOD, IMMUNOCHEMICAL

## 2019-04-30 DIAGNOSIS — R3989 Other symptoms and signs involving the genitourinary system: Secondary | ICD-10-CM | POA: Diagnosis not present

## 2019-04-30 DIAGNOSIS — Z9989 Dependence on other enabling machines and devices: Secondary | ICD-10-CM

## 2019-04-30 HISTORY — DX: Dependence on other enabling machines and devices: Z99.89

## 2019-05-03 ENCOUNTER — Other Ambulatory Visit: Payer: Self-pay | Admitting: Family Medicine

## 2019-05-03 ENCOUNTER — Other Ambulatory Visit (HOSPITAL_COMMUNITY)
Admission: RE | Admit: 2019-05-03 | Discharge: 2019-05-03 | Disposition: A | Discharge status: Home / Self Care | Payer: Medicaid Other | Source: Ambulatory Visit | Attending: Family Medicine | Admitting: Family Medicine

## 2019-05-03 ENCOUNTER — Ambulatory Visit: Payer: Medicaid Other | Admitting: Family Medicine

## 2019-05-03 ENCOUNTER — Encounter: Payer: Self-pay | Admitting: Family Medicine

## 2019-05-03 ENCOUNTER — Other Ambulatory Visit: Payer: Self-pay

## 2019-05-03 VITALS — BP 126/88 | HR 90 | Temp 96.8°F | Resp 16 | Ht 65.0 in | Wt 225.4 lb

## 2019-05-03 DIAGNOSIS — R8290 Unspecified abnormal findings in urine: Secondary | ICD-10-CM

## 2019-05-03 DIAGNOSIS — R10819 Abdominal tenderness, unspecified site: Secondary | ICD-10-CM

## 2019-05-03 DIAGNOSIS — R3915 Urgency of urination: Secondary | ICD-10-CM

## 2019-05-03 DIAGNOSIS — R319 Hematuria, unspecified: Secondary | ICD-10-CM

## 2019-05-03 DIAGNOSIS — Z87442 Personal history of urinary calculi: Secondary | ICD-10-CM

## 2019-05-03 NOTE — Patient Instructions (Signed)
Let me know if you want to do another urinary sample with CLEAN CATCH - I will run it again to check on bacteria and sugar.  We will call you with repeat urine culture results.  I ordered an ultrasound to check on your urinary track system  I will be putting in a referral to have you follow up with a specialist for urinary symptoms.

## 2019-05-03 NOTE — Progress Notes (Signed)
Patient ID: Sheena Martinez, female    DOB: Nov 12, 1960, 58 y.o.   MRN: [id number]  PCP: Hubbard Hartshorn, FNP  Chief Complaint  Patient presents with  . Urinary Tract Infection    patient stated that she was told that she has a UTI but then we they repeated it and they could not find anything. patient presents with lower back pain and pelvic pressure. she tried pelvic floor therarpy.  . Immunizations    patient declines flu shot    Subjective:   Sheena Martinez is a 58 y.o. female, presents to clinic with CC of the following:  Patient is here to follow-up on abnormal urine test that were done last week at rheumatology.  She has been complaining of bladder pressure, urinary urgency and frequency that is been worse than her baseline for the past 2 to 3 weeks.  Last week in rheumatology on Monday, 7 days ago they did a urinalysis which showed moderate bacteria, she had follow-up with them on Friday where urinalysis was done again and it showed many bacteria and urine culture was done, she was not treated with anything, is not taking any antibiotics, and was told to follow-up with her PCP.  She is here today to do that. I have reviewed the labs available through care everywhere it does show some positive values in the urine dip but a negative urine culture with many microbes and small amounts of normal genital flora.  When discussing test results and the test with the patient it was very clear that she did not do clean-catch technique.  She does have a history of some overactive bladder and urge incontinence she has previously seen urology for that and for kidney stones in the past.  She has been on medications before which she did not tolerate due to her Sjogren's, dry eyes, vision disturbances, and dry mouth.  She currently denies any vaginal symptoms including no genital rash, itching or swelling.  Denies vaginal discharge, she has not been sexually active for many many years, she highly doubts any  possibility of STDs.  She denies any dysuria or hematuria, she did note odor last week.  She also has felt pressure in her low pelvis and some achiness in suprapubic area, low back/buttock area and back pain. She denies any fever, chills, sweats, nausea, vomiting, abdominal pain, flank pain, change in appetite.  With hx of urge incontinence, previously did pelvic flood therapy.  She is UTD on PAP and pelvic exam done with Dr. Kenton Kingfisher about two months ago.  His well woman exam documentation was reviewed - pt states that she never got results of her Pap smear so I did review those results with her today and did go through and reviewed many of the urinalysis results with the patient as well.   Patient Active Problem List   Diagnosis Date Noted  . Osteopenia 03/18/2019  . Facial flushing 10/21/2018  . Tinnitus of both ears 10/21/2018  . Drug-induced neutropenia ([place]) 10/20/2018  . 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 Thrombocytosis ([place]) 10/20/2018  . 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 Irregular menstrual cycle 03/02/2018  . Screening for cervical cancer 03/02/2018  . Long-term current use of tamoxifen 02/17/2018  . 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 Anxiety 10/03/2017  . Bilateral primary osteoarthritis of knee 10/03/2017  . 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 Malignant neoplasm of upper-inner quadrant of left breast in female, estrogen receptor  positive ([place]) 05/22/2017  . Hirsutism 11/24/2015  . Hyperlipidemia 02/01/2014  . Impaired glucose tolerance 02/01/2014  . Vitamin D deficiency 02/01/2014  . Family history of diabetes mellitus 01/31/2014  . Morbid obesity with BMI of 40.0-44.9, adult ([place]) 01/31/2014  . Sjogren's syndrome ([place]) 01/17/2014      Current Outpatient Medications:  .  acetaminophen (TYLENOL) 500 MG tablet, Take 1,000 mg by mouth every 8 (eight) hours as needed for mild pain or headache.,  Disp: , Rfl:  .  brimonidine (ALPHAGAN) 0.2 % ophthalmic solution, INSTILL 1 DROP INTO LEFT EYE THREE TIMES DAILY, Disp: , Rfl: 12 .  Ca Carbonate-Mag Hydroxide (ROLAIDS PO), Take 1 tablet as needed by mouth (indigestion). , Disp: , Rfl:  .  cholecalciferol (VITAMIN D) 1000 units tablet, Take 2,000 Units by mouth daily., Disp: , Rfl:  .  ketoconazole (NIZORAL) 2 % shampoo, Apply 1 application topically 2 (two) times a week., Disp: 120 mL, Rfl: 3 .  LORazepam (ATIVAN) 0.5 MG tablet, , Disp: , Rfl: 0 .  meloxicam (MOBIC) 15 MG tablet, Take 0.5 tablets (7.5 mg total) by mouth daily., Disp: 90 tablet, Rfl: 0 .  methimazole (TAPAZOLE) 10 MG tablet, TAKE 1 TABLET BY MOUTH ONCE DAILY AT 6AM, Disp: , Rfl: 2 .  prednisoLONE acetate (PRED FORTE) 1 % ophthalmic suspension, , Disp: , Rfl: 1 .  tamoxifen (NOLVADEX) 20 MG tablet, Take 1 tablet by mouth once daily, Disp: 60 tablet, Rfl: 0 .  Timolol Maleate 0.5 % (DAILY) SOLN, Place 1 drop into the left eye 2 (two) times daily., Disp: , Rfl:  .  tizanidine (ZANAFLEX) 2 MG capsule, Take 1 capsule (2 mg total) by mouth 3 (three) times daily., Disp: 30 capsule, Rfl: 1 .  venlafaxine XR (EFFEXOR-XR) 37.5 MG 24 hr capsule, Take 1 capsule (37.5 mg) daily in the morning for 1 week, then increase to 2 capsules (75 mg) daily in the morning., Disp: , Rfl:  .  vitamin k 100 MCG tablet, Take 100 mcg by mouth 2 (two) times a week., Disp: , Rfl:  .  carboxymethylcellulose 1 % ophthalmic solution, Place 1 drop into the left eye 4 (four) times daily., Disp: , Rfl:  .  cycloSPORINE (RESTASIS) 0.05 % ophthalmic emulsion, INSTILL 1 DROP INTO EACH EYE TWICE DAILY, Disp: , Rfl:  .  oxybutynin (DITROPAN XL) 10 MG 24 hr tablet, Take 10 mg by mouth at bedtime., Disp: , Rfl:  .  sertraline (ZOLOFT) 100 MG tablet, Take 150 mg by mouth daily., Disp: , Rfl: 1 .  VESICARE 5 MG tablet, Take 5 mg by mouth daily., Disp: , Rfl:  .  vitamin C (ASCORBIC ACID) 500 MG tablet, Take 500 mg by mouth  daily., Disp: , Rfl:    Allergies  Allergen Reactions  . Rituximab Anaphylaxis  . Benadryl [Diphenhydramine] Other (See Comments)    Patient felt like she was going to "crawl out of her skin"  . Cortisone Other (See Comments)    Also reacts to cortisone injections; they make her flushed  . Diphenhydramine Hcl   . Clonazepam Anxiety  . Sulfamethoxazole-Trimethoprim Rash    Chest tightness. Bactrim     Family History  Problem Relation Age of Onset  . Breast cancer Maternal Aunt 64       currently ~65  . Diabetes Mother   . Skin cancer Mother        currently 69; TAH/BSO (age?)  . Anemia Mother   . Liver disease Father  deceased / not much info about him / alcoholic  . Lung cancer Paternal Aunt        smoker / deceased 4s  . Stomach cancer Paternal Uncle 40       deceased / deceased 97s  . Ovarian cancer Maternal Grandmother 73       deceased 44s  . Stomach cancer Paternal Grandfather 40       deceased 52s  . Cancer Maternal Uncle        unk. type; deceased 98s  . Leukemia Cousin 51       deceased 38     Social History   Socioeconomic History  . Marital status: Married    Spouse name: Eulas Post  . Number of children: 3  . Years of education: Not on file  . Highest education level: GED or equivalent  Occupational History  . Occupation: unemployed  Social Needs  . Financial resource strain: Not on file  . Food insecurity    Worry: Never true    Inability: Never true  . Transportation needs    Medical: No    Non-medical: No  Tobacco Use  . Smoking status: Former Smoker    Years: 7.00    Types: Cigarettes    Quit date: 08/13/1979    Years since quitting: 39.7  . Smokeless tobacco: Never Used  . Tobacco comment: AGE 34-19  Substance and Sexual Activity  . Alcohol use: No  . Drug use: No  . Sexual activity: Yes    Partners: Male  Lifestyle  . Physical activity    Days per week: 0 days    Minutes per session: 0 min  . Stress: Very much   Relationships  . Social connections    Talks on phone: More than three times a week    Gets together: More than three times a week    Attends religious service: More than 4 times per year    Active member of club or organization: Yes    Attends meetings of clubs or organizations: More than 4 times per year    Relationship status: Married  . Intimate partner violence    Fear of current or ex partner: No    Emotionally abused: No    Physically abused: No    Forced sexual activity: No  Other Topics Concern  . Not on file  Social History Narrative  . Not on file    I personally reviewed active problem list, medication list, allergies, family history, social history, health maintenance, notes from last several encounters, lab results, imaging with the patient/caregiver today.  Review of Systems  Constitutional: Negative.   HENT: Negative.   Eyes: Negative.   Respiratory: Negative.   Cardiovascular: Negative.   Gastrointestinal: Negative.  Negative for abdominal distention, anal bleeding, blood in stool, constipation, diarrhea, nausea and vomiting.       No change to stool/BM  Endocrine: Negative.   Genitourinary: Negative.   Musculoskeletal: Negative.   Skin: Negative.   Allergic/Immunologic: Negative.   Neurological: Negative.   Hematological: Negative.   Psychiatric/Behavioral: Negative.   All other systems reviewed and are negative.      Objective:   Vitals:   05/03/19 1326  BP: 126/88  Pulse: 90  Resp: 16  Temp: (!) 96.8 F (36 C)  TempSrc: Oral  SpO2: 98%  Weight: 225 lb 6.4 oz (102.2 kg)  Height: 5\' 5"  (1.651 m)    Body mass index is 37.51 kg/m.  Physical Exam Vitals signs and  nursing note reviewed.  Constitutional:      General: She is not in acute distress.    Appearance: Normal appearance. She is well-developed. She is obese. She is not ill-appearing, toxic-appearing or diaphoretic.     Interventions: Face mask in place.     Comments: Anxious,  obese, well-appearing female, looks stated age  HENT:     Head: Normocephalic and atraumatic.     Right Ear: External ear normal.     Left Ear: External ear normal.  Eyes:     General: Lids are normal. No scleral icterus.       Right eye: No discharge.        Left eye: No discharge.     Conjunctiva/sclera: Conjunctivae normal.  Neck:     Musculoskeletal: Normal range of motion and neck supple.     Trachea: Phonation normal. No tracheal deviation.  Cardiovascular:     Rate and Rhythm: Normal rate and regular rhythm.     Pulses: Normal pulses.          Radial pulses are 2+ on the right side and 2+ on the left side.       Posterior tibial pulses are 2+ on the right side and 2+ on the left side.     Heart sounds: Normal heart sounds. No murmur. No friction rub. No gallop.   Pulmonary:     Effort: Pulmonary effort is normal. No respiratory distress.     Breath sounds: Normal breath sounds. No stridor. No wheezing, rhonchi or rales.  Chest:     Chest wall: No tenderness.  Abdominal:     General: Bowel sounds are normal. There is no distension.     Palpations: Abdomen is soft.     Tenderness: There is abdominal tenderness. There is no right CVA tenderness, left CVA tenderness, guarding or rebound.     Comments: Protuberant obese abdomen, tenderness to palpation to suprapubic area, no CVA tenderness and no other abdominal tenderness, no rebound, no guarding, normal bowel sounds x4  Genitourinary:    Comments: Patient did undress from the waist down for quick visual inspection of vulvovaginal area and for me to get a vaginal swab for wet prep and STDs, visible weakening of the vaginal vault noted, external genitalia normal without any redness, swelling, lesions.  Introitus normal-appearing, no vaginal discharge no odor noted Musculoskeletal: Normal range of motion.        General: No deformity.     Right lower leg: No edema.     Left lower leg: No edema.  Lymphadenopathy:     Cervical: No  cervical adenopathy.  Skin:    General: Skin is warm and dry.     Capillary Refill: Capillary refill takes less than 2 seconds.     Coloration: Skin is not jaundiced or pale.     Findings: No rash.  Neurological:     Mental Status: She is alert and oriented to person, place, and time.     Motor: No abnormal muscle tone.     Gait: Gait normal.  Psychiatric:        Speech: Speech normal.        Behavior: Behavior normal.            Assessment & Plan:   Pt is a 58 y/o female here for urinary sx, follow up on abnormal urinalysis but neg urine culture, has some bladder pressure and low pelvic pain/discomfort.   1. Abnormal urine I did review results with the  patient from care everywhere, suspect bacteria is from poor technique -she did not do clean-catch, leukocytes and some glucosuria but negative nitrites, negative urine culture, no blood. Patient was unable to do a clean-catch sample here today I encouraged her to come back if she wanted urinalysis or microscopy redone.  On small amount of urine will repeat urine culture - Urine Culture  2. Urinary urgency I suspect that she has some overactive bladder versus interstitial cystitis -she is unable to tolerate anticholinergics and is currently not taking Ditropan which is on her chart.   Discussed trying Myrbetriq but unfortunately it has an interaction with tamoxifen Because of urinary symptoms and tenderness on exam will get a ultrasound -she also has history of kidney stones - US Renal - Cytology (oral, anal, urethral) ancillary only  Suspect that she will need to go back to urology or perhaps URO/GYN (previously pt at [facility] urology and current pt at [facility])  3.  Abdominal tenderness [place] - vaginal swab for wet prep and r/o STDs, Urine culture repeat to r/o UTI - would like her to follow up if tests neg - may need pelvic US? r/o other causes No GI sx or changes, no past GYN surgery and recently had well woman with  normal PAP and pelvic exam - US Renal - Cytology (oral, anal, urethral) ancillary only  Pt very anxious today, spent a lot of time discussing past results, EBM/standard of care with her and possible future work up and/or referrals if tests from today are negative.   Greater than 50% of this visit was spent in direct face-to-face counseling, obtaining history and physical, discussing and educating pt on treatment plan.  Total time of this visit was 25.  Remainder of time involved but was not limited to reviewing chart (recent and pertinent OV notes and labs), documentation in EMR, and coordinating care and treatment plan.  Delsa Grana, PA-C 05/03/19 1:45 PM

## 2019-05-04 ENCOUNTER — Encounter: Payer: Self-pay | Admitting: Physical Therapy

## 2019-05-04 ENCOUNTER — Ambulatory Visit: Payer: Medicaid Other | Attending: Family Medicine | Admitting: Physical Therapy

## 2019-05-04 DIAGNOSIS — M25571 Pain in right ankle and joints of right foot: Secondary | ICD-10-CM | POA: Diagnosis not present

## 2019-05-04 DIAGNOSIS — M25561 Pain in right knee: Secondary | ICD-10-CM | POA: Diagnosis not present

## 2019-05-04 DIAGNOSIS — G8929 Other chronic pain: Secondary | ICD-10-CM

## 2019-05-04 DIAGNOSIS — M25562 Pain in left knee: Secondary | ICD-10-CM | POA: Insufficient documentation

## 2019-05-04 DIAGNOSIS — M6281 Muscle weakness (generalized): Secondary | ICD-10-CM | POA: Diagnosis not present

## 2019-05-04 LAB — CYTOLOGY, (ORAL, ANAL, URETHRAL) ANCILLARY ONLY
Bacterial Vaginitis (gardnerella): NEGATIVE
Candida Glabrata: NEGATIVE
Candida Vaginitis: NEGATIVE
Molecular Disclaimer: NEGATIVE
Molecular Disclaimer: NEGATIVE
Molecular Disclaimer: NEGATIVE
Molecular Disclaimer: NORMAL
Trichomonas: NEGATIVE

## 2019-05-04 NOTE — Therapy (Signed)
[facility] [address] [place], [address] Phone: [phone]   Fax:  [phone]  Physical Therapy Evaluation  Patient Details  Name: Sheena Martinez MRN: [id number] Date of Birth: 1960/11/01 Referring Provider (PT): Wyatt Portela, PA-C   Encounter Date: 05/04/2019  PT End of Session - 05/04/19 1933    Visit Number  1    Number of Visits  12    Date for PT Re-Evaluation  06/30/19    Authorization Type  Medicaid reporting from 05/04/2019    Authorization Time Period  certification period: 05/04/2019 - 06/15/2019    Authorization - Visit Number  1    Authorization - Number of Visits  1    PT Start Time  0933    PT Stop Time  1100    PT Time Calculation (min)  87 min    Activity Tolerance  Patient tolerated treatment well;No increased pain    Behavior During Therapy  WFL for tasks assessed/performed       Past Medical History:  Diagnosis Date  . Anxiety   . Arthritis   . 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 GERD (gastroesophageal reflux disease)    OCC  . Goiter   . Hematuria 10/21/2018  . Hyperthyroidism   . Personal history of radiation therapy   . Sjogren's syndrome ([place])   . Uses continuous positive airway pressure (CPAP) ventilation at home 04/30/2019    Past Surgical History:  Procedure Laterality Date  . BREAST BIOPSY Left 05/12/2017   [place] bx/ INVASIVE MAMMARY CARCINOMA  . BREAST LUMPECTOMY Left 05/2017   invasive mammary carcinoma, neg margins  . BREAST LUMPECTOMY WITH SENTINEL LYMPH NODE BIOPSY Left 06/10/2017   Procedure: BREAST LUMPECTOMY WITH SENTINEL LYMPH NODE BX AND NEEDLE LOCALIZATION;  Surgeon: Robert Bellow, MD;  Location: [facility] ORS;  Service: General;  Laterality: Left;  . BREAST MAMMOSITE Left  06/24/2017   Procedure: MAMMOSITE BREAST;  Surgeon: Robert Bellow, MD;  Location: [facility] ORS;  Service: General;  Laterality: Left;  . CESAREAN SECTION  1995  . 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 COSMETIC SURGERY    . CYST EXCISION  2018   pilar cyst/ Dr Will Bonnet  . EYE SURGERY    . PORTACATH PLACEMENT Right 07/08/2017   Procedure: INSERTION PORT-A-CATH;  Surgeon: Robert Bellow, MD;  Location: [facility] ORS;  Service: General;  Laterality: Right;  . TONSILLECTOMY      There were no vitals filed for this visit.   Subjective Assessment - 05/04/19 0941    Subjective  Patient reports her the onset of her R knee pain was 4 years ago. At one day she went to grocery for 2 hours shopping and she felt R knee posterior popped and pain started right after the popping. She then received a shot at her R posterior knee for pain control. It worked well for her but the pain came back gradually. She then reported that the onset of the L knee pain started after sitting at a pedicure shop with both knee flexed for two hours. She felt a snap at her R knee posteriorly and pain started. Patient cannot sidlying on the bed and put knee together due to significant knee pain and stiffness on next day in  the morning. Therefore, she is sleeping on a recliner right now. Patient also has difficulties when standing in the kitchen to wash dishes due to significant stiffness as well as bilateral knee pain.    Pertinent History  Patient is a 58 y.o. female who presents to outpatient physical therapy with a referral for medical diagnosis of bilateral primary osteoarthritis of knee and other specified arthritis, right ankle and foot. This patient's chief complaints consist of constant bilateral knee pain and occasional R ankle pain that has been gradually increasing in severity since insidious onset about 4 years ago, leading to the following functional deficits: difficulty prolonged sitting, standing, walking, quality of life,  lifting, etc.. 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 Past surgeries include: breast lumpectomy 2018, breast mammosite 2018, corneal transplant 2019 L eye. (See more details in chart.)    Limitations  Sitting;Standing;Walking    How long can you sit comfortably?  60 minutes    How long can you stand comfortably?  45 minutes    How long can you walk comfortably?  30 minutes    Diagnostic tests  X-ray Impression: Advanced medial and patellofemoral osteoarthritis at R knee    Patient Stated Goals  reduce bilateral knee pain and able to go to grocery store shopping for at least 2 hours    Currently in Pain?  Yes    Pain Score  4     Pain Location  Knee    Pain Orientation  Right;Left;Posterior;Anterior    Pain Descriptors / Indicators  Constant    Pain Type  Chronic pain    Pain Radiating Towards  N/A    Pain Onset  More than a month ago    Pain Frequency  Constant    Aggravating Factors   prolonged walking; sitting; sidlying sleeping on bed    Pain Relieving Factors  ice pack; muscle relaxer    Effect of Pain on Daily Activities  Impedes daily activities (see objective for details)    Pain Score  0    Pain Location  Ankle   occasional pain   Pain Orientation  Right;Lateral    Pain Descriptors / Indicators  Aching    Pain Onset  More than a month ago    Aggravating Factors   Prolonged walking    Pain Relieving Factors  ankle brace which provide pain relief which leads today's treatment sesion (0/10 pain)            [facility] PT Assessment - 05/05/19 0001      Assessment   Medical Diagnosis  bilateral knee arthritis and right ankle arthritis    Referring Provider (PT)  Wyatt Portela, PA-C    Onset Date/Surgical Date  --   at least 4 years ago   Hand Dominance   Right    Prior Therapy  none for this condition prior to current episode of care      Precautions   Precautions  None      Restrictions   Weight Bearing Restrictions  No      Balance Screen   Has the patient fallen in the past 6 months  No      [place]  --   patient not concerned about home layout     Prior Function   Level of Independence  Independent  Vocation Requirements  ; used to provide childcare      Cognition   Overall Cognitive Status  Within Functional Limits for tasks assessed      Observation/Other Assessments   Observations  see note from 05/04/2019 for latest objective data    Focus on Therapeutic Outcomes (FOTO)   FOTO = 51       OBJECTIVE Posture Mild forward neck posture and shoulder protraction and exaggerate lumbar lordosis.   Palpation Moderate pain to palpation along medial and lateral joint line of bilateral knee. Pain at patellar tendon. No pain with palpation to quadriceps or hamstrings.  TTP just distal to R lateral malleolous  Strength R/L 4/4- Hip flexion 4/4 Hip extension  4-/4- Hip abduction 4-/4- Hip adduction 5/5 Knee extension 5/4 Knee flexion 5/5 Ankle Dorsiflexion 5/5 Ankle Plantarflexion 4/4 Ankle Inversion 4*/4 Ankle Eversion *indicates pain  AROM Knee R/L WFL; soft end range feeling; no pain  Hip: WFL Ankle: grossly WFL for basic motions, detailed measurement deferred to future visit.  Passive Accessory Motion Patella: Bilateral hypomobile without increase of pain.    SPECIAL TESTS Anterior Drawer Test: Negative  Lachman Test:  Negative  Pivot-Shift Test: No pain and crepitus noted Toe/heel walking: Able to walk  no pain    Objective measurements completed on examination: See above findings.   Therapeutic exercise: to centralize symptoms and improve ROM, strength, muscular endurance, and activity tolerance required for successful completion of functional activities.  - LE  strengthening   -Sidelying R/L hip abduction 3x10 reps   - Supine bridge 3x10 reps.  Cuing provided for forms, muscle activation patterns, as well as modifications. Patient is not able to tolerate standing format due to knee pain when single leg standing.  -Patient was educated on diagnosis, anatomy and pathology involved, prognosis, role of PT, and was given an HEP, demonstrating exercise with proper form following verbal and tactile cues, and was given a paper hand out to continue exercise at home. Pt was educated on and agreed to plan of care.   HOME EXERCISE PROGRAM Access Code: [email or web address]  URL: [email or web address]  Date: 05/04/2019  Prepared by: Rosita Kea   Exercises  Sidelying Hip Abduction - 2 sets - 10 reps - 2x daily - 7x weekly  Beginner Bridge - 2 sets - 10 reps - 2x daily - 7x weekly    Clinical impression:  Patient is a 58 y.o. female who presents to outpatient physical therapy with a referral for medical diagnosis of bilateral primary osteoarthritis of knee and other specified arthritis, right ankle and foot Patient presents with signs and symptoms consistent with exacerbation of chronic bilateral knee pain, stiffness, muscle spasms, and intermittant chronic R ankle pain. Upon assessment, pt demonstrated deficits such as significant tenderness to palpation of bilateral lateral medial knees, as well as deficits in strength (and pain with strength assessment), hypomobility at B patella. These deficits limit the patient ability to perform things such as ADLs, IADLs (vacumming), social participation, caring for and playing with grandchildren, engaging in hobbies (grocery shopping), and impairs her quality of life. The pt will benefit from skilled PT services to address deficits and return to PLOF at home, recreational activity and work.  PT Education - 05/04/19 1933    Education Details  Exercise purpose/form. Self management techniques. Education on diagnosis,  prognosis, POC, anatomy and physiology of current condition Education on HEP including handout    Person(s) Educated  Patient    Methods  Explanation;Demonstration;Tactile cues;Verbal cues;Handout  Comprehension  Verbalized understanding;Returned demonstration;Verbal cues required;Tactile cues required       PT Short Term Goals - 05/04/19 2005      PT SHORT TERM GOAL #1   Title  Be independent with initial home exercise program for self-management of symptoms.    Baseline  initial HEP provided at IE (05/04/2019);    Time  2    Period  Weeks    Status  New    Target Date  05/18/19        PT Long Term Goals - 05/04/19 2009      PT LONG TERM GOAL #1   Title  Be independent with a long-term home exercise program for self-management of symptoms.    Baseline  initial HEP provided at IE (05/04/2019);    Time  8    Period  Weeks    Status  New    Target Date  06/30/19      PT LONG TERM GOAL #2   Title  Demonstrate improved FOTO score by 10 units to demonstrate improvement in overall condition and self-reported functional ability.    Baseline  FOTO 51 MCD 9(05/04/2019);    Time  8    Period  Weeks    Status  New    Target Date  06/30/19      PT LONG TERM GOAL #3   Title  Reduce pain with functional activities to equal or less than 1/10 to allow patient to complete usual activities including ADLs, IADLs, and social engagement with less difficulty.    Baseline  4/10 currently(05/04/2019);    Time  8    Period  Weeks    Status  New    Target Date  06/30/19      PT LONG TERM GOAL #4   Title  Complete community, work and/or recreational activities without limitation due to current condition.    Baseline  Difficulties go to grocery shopping; long time sitting, walking, sleeping(05/04/2019);    Time  8    Period  Weeks    Status  New    Target Date  06/30/19      PT LONG TERM GOAL #5   Title  Demonstrated at least 1 grade improvement for MMT to improve functional mobility and  strength.    Baseline  See objectives (05/04/2019);    Time  8    Period  Weeks    Status  New    Target Date  06/30/19             Plan - 05/04/19 1935    Clinical Impression Statement  Patient is a 58 y.o. female who presents to outpatient physical therapy with a referral for medical diagnosis of bilateral primary osteoarthritis of knee and other specified arthritis, right ankle and foot Patient presents with signs and symptoms consistent with exacerbation of chronic bilateral knee pain, stiffness, muscle spasms, and intermittant chronic R ankle pain. Upon assessment, pt demonstrated deficits such as significant tenderness to palpation of bilateral lateral medial knees, as well as deficits in strength (and pain with strength assessment), hypomobility at B patella. These deficits limit the patient ability to perform things such as ADLs, IADLs (vacumming), social participation, caring for and playing with grandchildren, engaging in hobbies (grocery shopping), and impairs her quality of life. The pt will benefit from skilled PT services to address deficits and return to PLOF at home, recreational activity and work.    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, anxiety, hyperthyroidism    Examination-Activity Limitations  Sleep;Sit;Stand;Caring for Others;Squat    Examination-Participation Restrictions  Community Activity;Shop;Other   sleeping on her bed   Stability/Clinical Decision Making  Evolving/Moderate complexity    Clinical Decision Making  Moderate    Rehab Potential  Good    PT Frequency  2x / week    PT Duration  8 weeks    PT Treatment/Interventions  Joint Manipulations;ADLs/Self Care Home Management;Cryotherapy;Therapeutic activities;Functional mobility training;Therapeutic  exercise;Balance training;Neuromuscular re-education;Patient/family education;Manual techniques;Passive range of motion;Dry needling;Other (comment);Moist Heat;Electrical Stimulation;Gait training   joint mobilization grades I-V   PT Next Visit Plan  Muscle LE strength, endurance, as well as pain control .    PT Home Exercise Plan  Medbridge: [email or web address]    Consulted and Agree with Plan of Care  Patient       Patient will benefit from skilled therapeutic intervention in order to improve the following deficits and impairments:  Decreased balance, Decreased endurance, Abnormal gait, Increased muscle spasms, Decreased activity tolerance, Decreased strength, Pain, Improper body mechanics, Difficulty walking, Obesity, Impaired perceived functional ability, Impaired flexibility  Visit Diagnosis: Chronic pain of right knee  Chronic pain of left knee  Pain in right ankle and joints of right foot  Muscle weakness (generalized)     Problem List Patient Active Problem List   Diagnosis Date Noted  . Osteopenia 03/18/2019  . Facial flushing 10/21/2018  . Tinnitus of both ears 10/21/2018  . Drug-induced neutropenia ([place]) 10/20/2018  . 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 Thrombocytosis ([place]) 10/20/2018  . 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 Irregular menstrual cycle 03/02/2018  . Screening for cervical cancer 03/02/2018  . Long-term current use of tamoxifen 02/17/2018  . 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 Anxiety 10/03/2017  . Bilateral primary osteoarthritis of knee 10/03/2017  . 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 Malignant neoplasm of upper-inner quadrant of left breast in female, estrogen receptor positive ([place]) 05/22/2017  . Hirsutism 11/24/2015  . Hyperlipidemia 02/01/2014  . Impaired glucose tolerance 02/01/2014  . Vitamin D deficiency 02/01/2014  . Family history of diabetes mellitus 01/31/2014  . Morbid  obesity with BMI of 40.0-44.9, adult ([place]) 01/31/2014  . Sjogren's syndrome ([place]) 01/17/2014    Sherrilyn Rist, SPT 05/05/19, 11:31 AM  Everlean Alstrom. Graylon Good, PT, DPT 05/05/19, 11:32 AM   [facility] [address], [place], [address] Phone: [phone]   Fax:  [phone]  Name: Sheena Martinez MRN: [id number] Date of Birth: September 17, 1960

## 2019-05-05 LAB — URINE CULTURE
MICRO NUMBER:: 904861
SPECIMEN QUALITY:: ADEQUATE

## 2019-05-05 LAB — CERVICOVAGINAL ANCILLARY ONLY
Chlamydia: NEGATIVE
Neisseria Gonorrhea: NEGATIVE
Trichomonas: NEGATIVE

## 2019-05-06 ENCOUNTER — Ambulatory Visit
Admission: RE | Admit: 2019-05-06 | Discharge: 2019-05-06 | Disposition: A | Discharge status: Home / Self Care | Payer: Medicaid Other | Source: Ambulatory Visit | Attending: Oncology | Admitting: Oncology

## 2019-05-06 DIAGNOSIS — Z170 Estrogen receptor positive status [ER+]: Secondary | ICD-10-CM

## 2019-05-06 DIAGNOSIS — C50212 Malignant neoplasm of upper-inner quadrant of left female breast: Secondary | ICD-10-CM | POA: Diagnosis not present

## 2019-05-06 DIAGNOSIS — R928 Other abnormal and inconclusive findings on diagnostic imaging of breast: Secondary | ICD-10-CM | POA: Diagnosis not present

## 2019-05-07 ENCOUNTER — Ambulatory Visit: Payer: Medicaid Other

## 2019-05-07 ENCOUNTER — Other Ambulatory Visit: Payer: Self-pay

## 2019-05-07 ENCOUNTER — Encounter: Payer: Self-pay | Admitting: Oncology

## 2019-05-07 NOTE — Progress Notes (Signed)
Patient pre screened for office appointment, no questions or concerns today. 

## 2019-05-10 ENCOUNTER — Inpatient Hospital Stay: Payer: Medicaid Other | Attending: Oncology

## 2019-05-10 ENCOUNTER — Inpatient Hospital Stay (HOSPITAL_BASED_OUTPATIENT_CLINIC_OR_DEPARTMENT_OTHER): Payer: Medicaid Other | Admitting: Oncology

## 2019-05-10 ENCOUNTER — Encounter: Payer: Self-pay | Admitting: Oncology

## 2019-05-10 ENCOUNTER — Other Ambulatory Visit: Payer: Self-pay

## 2019-05-10 VITALS — BP 126/86 | HR 88 | Temp 98.6°F | Resp 16 | Wt 225.6 lb

## 2019-05-10 DIAGNOSIS — Z808 Family history of malignant neoplasm of other organs or systems: Secondary | ICD-10-CM | POA: Diagnosis not present

## 2019-05-10 DIAGNOSIS — R232 Flushing: Secondary | ICD-10-CM | POA: Diagnosis not present

## 2019-05-10 DIAGNOSIS — Z95828 Presence of other vascular implants and grafts: Secondary | ICD-10-CM

## 2019-05-10 DIAGNOSIS — M2550 Pain in unspecified joint: Secondary | ICD-10-CM | POA: Insufficient documentation

## 2019-05-10 DIAGNOSIS — Z8041 Family history of malignant neoplasm of ovary: Secondary | ICD-10-CM | POA: Diagnosis not present

## 2019-05-10 DIAGNOSIS — F411 Generalized anxiety disorder: Secondary | ICD-10-CM | POA: Diagnosis not present

## 2019-05-10 DIAGNOSIS — Z170 Estrogen receptor positive status [ER+]: Secondary | ICD-10-CM

## 2019-05-10 DIAGNOSIS — M1990 Unspecified osteoarthritis, unspecified site: Secondary | ICD-10-CM | POA: Diagnosis not present

## 2019-05-10 DIAGNOSIS — Z833 Family history of diabetes mellitus: Secondary | ICD-10-CM | POA: Insufficient documentation

## 2019-05-10 DIAGNOSIS — M8580 Other specified disorders of bone density and structure, unspecified site: Secondary | ICD-10-CM | POA: Insufficient documentation

## 2019-05-10 DIAGNOSIS — Z79810 Long term (current) use of selective estrogen receptor modulators (SERMs): Secondary | ICD-10-CM | POA: Diagnosis not present

## 2019-05-10 DIAGNOSIS — H16009 Unspecified corneal ulcer, unspecified eye: Secondary | ICD-10-CM | POA: Diagnosis not present

## 2019-05-10 DIAGNOSIS — E282 Polycystic ovarian syndrome: Secondary | ICD-10-CM | POA: Insufficient documentation

## 2019-05-10 DIAGNOSIS — Z8379 Family history of other diseases of the digestive system: Secondary | ICD-10-CM | POA: Insufficient documentation

## 2019-05-10 DIAGNOSIS — M3500 Sicca syndrome, unspecified: Secondary | ICD-10-CM | POA: Insufficient documentation

## 2019-05-10 DIAGNOSIS — Z881 Allergy status to other antibiotic agents status: Secondary | ICD-10-CM | POA: Diagnosis not present

## 2019-05-10 DIAGNOSIS — C50212 Malignant neoplasm of upper-inner quadrant of left female breast: Secondary | ICD-10-CM | POA: Insufficient documentation

## 2019-05-10 DIAGNOSIS — Z803 Family history of malignant neoplasm of breast: Secondary | ICD-10-CM | POA: Insufficient documentation

## 2019-05-10 DIAGNOSIS — E0590 Thyrotoxicosis, unspecified without thyrotoxic crisis or storm: Secondary | ICD-10-CM

## 2019-05-10 DIAGNOSIS — Z800 Family history of malignant neoplasm of digestive organs: Secondary | ICD-10-CM | POA: Insufficient documentation

## 2019-05-10 DIAGNOSIS — Z560 Unemployment, unspecified: Secondary | ICD-10-CM | POA: Diagnosis not present

## 2019-05-10 DIAGNOSIS — Z79899 Other long term (current) drug therapy: Secondary | ICD-10-CM | POA: Insufficient documentation

## 2019-05-10 DIAGNOSIS — H16002 Unspecified corneal ulcer, left eye: Secondary | ICD-10-CM

## 2019-05-10 DIAGNOSIS — Z832 Family history of diseases of the blood and blood-forming organs and certain disorders involving the immune mechanism: Secondary | ICD-10-CM | POA: Diagnosis not present

## 2019-05-10 DIAGNOSIS — Z87891 Personal history of nicotine dependence: Secondary | ICD-10-CM | POA: Diagnosis not present

## 2019-05-10 DIAGNOSIS — Z923 Personal history of irradiation: Secondary | ICD-10-CM | POA: Insufficient documentation

## 2019-05-10 DIAGNOSIS — Z806 Family history of leukemia: Secondary | ICD-10-CM | POA: Insufficient documentation

## 2019-05-10 LAB — CBC WITH DIFFERENTIAL/PLATELET
Abs Immature Granulocytes: 0.01 10*3/uL (ref 0.00–0.07)
Basophils Absolute: 0 10*3/uL (ref 0.0–0.1)
Basophils Relative: 0 %
Eosinophils Absolute: 0.1 10*3/uL (ref 0.0–0.5)
Eosinophils Relative: 3 %
HCT: 39.2 % (ref 36.0–46.0)
Hemoglobin: 13.2 g/dL (ref 12.0–15.0)
Immature Granulocytes: 0 %
Lymphocytes Relative: 48 %
Lymphs Abs: 1.9 10*3/uL (ref 0.7–4.0)
MCH: 31.4 pg (ref 26.0–34.0)
MCHC: 33.7 g/dL (ref 30.0–36.0)
MCV: 93.3 fL (ref 80.0–100.0)
Monocytes Absolute: 0.3 10*3/uL (ref 0.1–1.0)
Monocytes Relative: 8 %
Neutro Abs: 1.6 10*3/uL — ABNORMAL LOW (ref 1.7–7.7)
Neutrophils Relative %: 41 %
Platelets: 287 10*3/uL (ref 150–400)
RBC: 4.2 MIL/uL (ref 3.87–5.11)
RDW: 13 % (ref 11.5–15.5)
WBC: 3.9 10*3/uL — ABNORMAL LOW (ref 4.0–10.5)
nRBC: 0 % (ref 0.0–0.2)

## 2019-05-10 LAB — COMPREHENSIVE METABOLIC PANEL
ALT: 24 U/L (ref 0–44)
AST: 27 U/L (ref 15–41)
Albumin: 3.6 g/dL (ref 3.5–5.0)
Alkaline Phosphatase: 84 U/L (ref 38–126)
Anion gap: 7 (ref 5–15)
BUN: 14 mg/dL (ref 6–20)
CO2: 26 mmol/L (ref 22–32)
Calcium: 8.9 mg/dL (ref 8.9–10.3)
Chloride: 106 mmol/L (ref 98–111)
Creatinine, Ser: 0.88 mg/dL (ref 0.44–1.00)
GFR calc Af Amer: >60 mL/min (ref >60)
GFR calc non Af Amer: >60 mL/min (ref >60)
Glucose, Bld: 134 mg/dL — ABNORMAL HIGH (ref 70–99)
Potassium: 3.9 mmol/L (ref 3.5–5.1)
Sodium: 139 mmol/L (ref 135–145)
Total Bilirubin: 0.3 mg/dL (ref 0.3–1.2)
Total Protein: 6.8 g/dL (ref 6.5–8.1)

## 2019-05-10 MED ORDER — TAMOXIFEN CITRATE 20 MG PO TABS: 20.0000 mg | ORAL_TABLET | Freq: Every day | ORAL | 0 refills | Status: DC

## 2019-05-10 NOTE — Progress Notes (Signed)
Hematology/Oncology Follow up note Sheena Martinez [phone] [phone]   Patient Care Team: Sheena Hartshorn, FNP as PCP - General (Family Medicine) Sheena Junker, RN as Registered Nurse Sheena Demark, RN as Registered Nurse Byrnett, Sheena Gleason, MD (General Surgery) Sheena Filbert, MD as Referring Physician (Radiation Oncology) Sheena Server, MD as Consulting Physician (Oncology) Sheena Hough, MD as Referring Physician (Endocrinology)  REFERRING PROVIDER: Hubbard Hartshorn, FNP REASON FOR VISIT Follow up for breast cancer  HISTORY OF PRESENTING ILLNESS:  Sheena Martinez is a  58 y.o.  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 Margin is negative, no LVI. Mammoprintr revealed 10 year recurrence rate at 29% high risk group. Patient has completed MammoSite RT. Denies any hormonal replacement therapy.  Adjuvant chemotherapy with TC, finished 3 cycles.  She is perimenopausal.   # His Sjogren disease for which she has had tear duct surgery. She also reports history of PCOS with elevated testosterone level.   Genetic testing:  Genetic testing result vis [facility] showed no pathogenic sequence appearance or deletions /duplications.  # she developed corneal ulcer for which she has to undergo emergency ophthalmology surgery.  This is considered to be related to Sjogren's syndrome.  Patient has been started on prednisone by ophthalmologist.  Her anxiety has not been well controlled lately due to her ophthalmology problems.  Patient has been on Paxil for a long time and recently switched to Lexapro as Paxil interfere with efficacy of tamoxifen.  She feels her anxiety is not well controlled.  Current Treatment Adjuvant TC x3. Patient declined cycle 4.  Started on Tamoxifen in March 2019. Declined option of ovarian suppression with aromatase inhibitor as patient is concerned about having new side  effects.   INTERVAL HISTORY 58 y.o. female  presented for follow up for breast cancer. Patient is taking Tamoxifen '20mg'$  daily. She has hot flash.  Follows up with Gyn and was recommended to try  black cohosh  She has not started yet and want to check with me if ok to be used with Tamoxifen.   She has anxiety and follows up with [facility] [place] [facility] psychiatrist and was previously on Zoloft and Ativan. Recently Zoloft was changed to Effexor.  Hyperthyroidism, on methimazole.  Follows up with endocrinology. Cornea ulcer, s/p cornea transplant. Currently on steroid eye drop Denies any fever, chills, chest pain, shortness of breath, abdominal pain, back pain, lower extremity swelling. Denies any concern of her breast.  She has had bilateral diagnostic mammogram done on 05/06/2019  which did not show malignant findings. Bone density 03/15/2019 showed osteopenia.   Review of Systems  Constitutional: Negative for appetite change, chills, fatigue and fever.  HENT:   Negative for hearing loss and voice change.   Eyes: Negative for eye problems.  Respiratory: Negative for chest tightness and cough.   Cardiovascular: Negative for chest pain.  Gastrointestinal: Negative for abdominal distention, abdominal pain and blood in stool.  Endocrine: Positive for hot flashes.  Genitourinary: Negative for difficulty urinating and frequency.   Musculoskeletal: Positive for arthralgias.  Skin: Negative for itching and rash.  Neurological: Negative for extremity weakness.  Hematological: Negative for adenopathy.  Psychiatric/Behavioral: Negative for confusion. The patient is not nervous/anxious.     MEDICAL HISTORY:  Past Medical History:  Diagnosis Date  . Anxiety   . Arthritis   . Breast cancer ([place]) 05/12/2017   INVASIVE MAMMARY CARCINOMA ER/PR positive LEFT BREAST UPPER inner  QUAD  . Corneal perforation of left eye 10/09/2017   Overview:  Added automatically from request for surgery [id number]  . GERD  (gastroesophageal reflux disease)    OCC  . Goiter   . Hematuria 10/21/2018  . Hyperthyroidism   . Personal history of radiation therapy   . Sjogren's syndrome ([place])   . Uses continuous positive airway pressure (CPAP) ventilation at home 04/30/2019    SURGICAL HISTORY: Past Surgical History:  Procedure Laterality Date  . BREAST BIOPSY Left 05/12/2017   [place] bx/ INVASIVE MAMMARY CARCINOMA  . BREAST LUMPECTOMY Left 05/2017   invasive mammary carcinoma, neg margins  . BREAST LUMPECTOMY WITH SENTINEL LYMPH NODE BIOPSY Left 06/10/2017   Procedure: BREAST LUMPECTOMY WITH SENTINEL LYMPH NODE BX AND NEEDLE LOCALIZATION;  Surgeon: Sheena Bellow, MD;  Location: [facility] ORS;  Service: General;  Laterality: Left;  . BREAST MAMMOSITE Left 06/24/2017   Procedure: MAMMOSITE BREAST;  Surgeon: Sheena Bellow, MD;  Location: [facility] ORS;  Service: General;  Laterality: Left;  . CESAREAN SECTION  1995  . 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 COSMETIC SURGERY    . CYST EXCISION  2018   pilar cyst/ Dr Sheena Martinez  . EYE SURGERY    . PORTACATH PLACEMENT Right 07/08/2017   Procedure: INSERTION PORT-A-CATH;  Surgeon: Sheena Bellow, MD;  Location: [facility] ORS;  Service: General;  Laterality: Right;  . TONSILLECTOMY      SOCIAL HISTORY: Social History   Socioeconomic History  . Marital status: Married    Spouse name: Sheena Martinez  . Number of children: 3  . Years of education: Not on file  . Highest education level: GED or equivalent  Occupational History  . Occupation: unemployed  Social Needs  . Financial resource strain: Not on file  . Food insecurity    Worry: Never true    Inability: Never true  . Transportation needs    Medical: No    Non-medical: No  Tobacco Use  . Smoking status: Former Smoker    Years: 7.00    Types: Cigarettes    Quit date: 08/13/1979    Years since quitting: 39.7  . Smokeless tobacco: Never Used  . Tobacco comment: AGE [age]  Substance and Sexual Activity  . Alcohol  use: No  . Drug use: No  . Sexual activity: Yes    Partners: Male  Lifestyle  . Physical activity    Days per week: 0 days    Minutes per session: 0 min  . Stress: Very much  Relationships  . Social connections    Talks on phone: More than three times a week    Gets together: More than three times a week    Attends religious service: More than 4 times per year    Active member of club or organization: Yes    Attends meetings of clubs or organizations: More than 4 times per year    Relationship status: Married  . Intimate partner violence    Fear of current or ex partner: No    Emotionally abused: No    Physically abused: No    Forced sexual activity: No  Other Topics Concern  . Not on file  Social History Narrative  . Not on file    FAMILY HISTORY: Family History  Problem Relation Age of Onset  . Breast cancer Maternal Aunt 18       currently ~65  . Diabetes Mother   . Skin cancer Mother  currently 66; TAH/BSO (age?)  . Anemia Mother   . Liver disease Father        deceased / not much info about him / alcoholic  . Lung cancer Paternal Aunt        smoker / deceased 8s  . Stomach cancer Paternal Uncle 60       deceased / deceased 54s  . Ovarian cancer Maternal Grandmother 31       deceased 66s  . Stomach cancer Paternal Grandfather 11       deceased 44s  . Cancer Maternal Uncle        unk. type; deceased 46s  . Leukemia Cousin 48       deceased 14    ALLERGIES:  is allergic to rituximab; benadryl [diphenhydramine]; cortisone; diphenhydramine hcl; clonazepam; and sulfamethoxazole-trimethoprim.  MEDICATIONS:  Current Outpatient Medications  Medication Sig Dispense Refill  . acetaminophen (TYLENOL) 500 MG tablet Take 1,000 mg by mouth every 8 (eight) hours as needed for mild pain or headache.    . brimonidine (ALPHAGAN) 0.2 % ophthalmic solution INSTILL 1 DROP INTO LEFT EYE THREE TIMES DAILY  12  . Ca Carbonate-Mag Hydroxide (ROLAIDS PO) Take 1 tablet as  needed by mouth (indigestion).     . carboxymethylcellulose 1 % ophthalmic solution Place 1 drop into the left eye 4 (four) times daily.    . cholecalciferol (VITAMIN D) 1000 units tablet Take 2,000 Units by mouth daily.    Marland Kitchen ketoconazole (NIZORAL) 2 % shampoo Apply 1 application topically 2 (two) times a week. 120 mL 3  . LORazepam (ATIVAN) 0.5 MG tablet   0  . meloxicam (MOBIC) 15 MG tablet Take 0.5 tablets (7.5 mg total) by mouth daily. 90 tablet 0  . methimazole (TAPAZOLE) 10 MG tablet TAKE 1 TABLET BY MOUTH ONCE DAILY AT 6AM  2  . prednisoLONE acetate (PRED FORTE) 1 % ophthalmic suspension   1  . tamoxifen (NOLVADEX) 20 MG tablet Take 1 tablet (20 mg total) by mouth daily. 90 tablet 0  . Timolol Maleate 0.5 % (DAILY) SOLN Place 1 drop into the left eye 2 (two) times daily.    . tizanidine (ZANAFLEX) 2 MG capsule Take 1 capsule (2 mg total) by mouth 3 (three) times daily. 30 capsule 1  . venlafaxine XR (EFFEXOR-XR) 37.5 MG 24 hr capsule Take 1 capsule (37.5 mg) daily in the morning for 1 week, then increase to 2 capsules (75 mg) daily in the morning.    . vitamin k 100 MCG tablet Take 100 mcg by mouth 2 (two) times a week.    . VESICARE 5 MG tablet Take 5 mg by mouth daily.     No current facility-administered medications for this visit.       Marland Kitchen  PHYSICAL EXAMINATION: ECOG PERFORMANCE STATUS: 0 - Asymptomatic Vitals:   05/10/19 0959  BP: 126/86  Pulse: 88  Resp: 16  Temp: 98.6 F (37 C)  SpO2: 95%   Physical Exam  Constitutional: She is oriented to person, place, and time. No distress.  Obese.   HENT:  Head: Normocephalic and atraumatic.  Nose: Nose normal.  Mouth/Throat: Oropharynx is clear and moist. No oropharyngeal exudate.  Eyes: Pupils are equal, round, and reactive to light. Conjunctivae and EOM are normal. No scleral icterus.  Neck: Normal range of motion. Neck supple. No thyromegaly present.  Cardiovascular: Normal rate, regular rhythm and normal heart sounds.   No murmur heard. Pulmonary/Chest: Effort normal and breath sounds normal. No respiratory  distress. She has no wheezes. She has no rales. She exhibits no tenderness.  Abdominal: Soft. Bowel sounds are normal. She exhibits no distension. There is no abdominal tenderness.  Musculoskeletal: Normal range of motion.        General: No edema.  Lymphadenopathy:    She has no cervical adenopathy.  Neurological: She is alert and oriented to person, place, and time. No cranial nerve deficit. She exhibits normal muscle tone. Coordination normal.  Skin: Skin is warm and dry. No rash noted. She is not diaphoretic. No erythema.  Psychiatric: Affect normal.   LABORATORY DATA:  I have reviewed the data as listed: no recent labs.  Lab Results  Component Value Date   WBC 3.9 (L) 05/10/2019   HGB 13.2 05/10/2019   HCT 39.2 05/10/2019   MCV 93.3 05/10/2019   PLT 287 05/10/2019   Recent Labs    10/23/18 2321 12/22/18 1357 05/10/19 0946  NA 138 141 139  K 3.6 4.0 3.9  CL 106 108 106  CO2 '23 28 26  '$ GLUCOSE 102* 98 134*  BUN '14 17 14  '$ CREATININE 0.78 0.71 0.88  CALCIUM 9.0 8.8* 8.9  GFRNONAA >60 >60 >60  GFRAA >60 >60 >60  PROT 6.9 6.5 6.8  ALBUMIN 3.9 3.5 3.6  AST '19 19 27  '$ ALT '16 15 24  '$ ALKPHOS 69 81 84  BILITOT 0.7 0.3 0.3    RADIOGRAPHIC STUDIES: I have personally reviewed the radiological images as listed and agreed with the findings in the report. 05/04/2018 bilateral diagnostic mammogram benign finding.  ASSESSMENT & PLAN:  58 yo premenopausal female with Stage 1A breast cancer, high risk mammaprint index currently on adjuvant chemotherapy with TC. Cancer Staging Malignant neoplasm of upper-inner quadrant of left breast in female, estrogen receptor positive ([place]) Staging form: Breast, AJCC 8th Edition - Clinical stage from 05/22/2017: Stage IA (cT1, cN0, cM0, G1, ER: Positive, PR: Positive, HER2: Negative) - Signed by Sheena Server, MD on 05/22/2017 - Pathologic stage from  07/09/2017: Stage IA (pT1b, pN0, cM0, G2, ER: Positive, PR: Positive, HER2: Negative) - Signed by Sheena Server, MD on 07/30/2017  1. Malignant neoplasm of upper-inner quadrant of left breast in female, estrogen receptor positive ([place])   2. Port-A-Cath in place   3. Long-term current use of tamoxifen   4. Generalized anxiety disorder     # Stage IA, left Breast cancer.  Continue Tamoxifen '20mg'$  daily. .  I discussed with her that there is some evidence of black cohosh is effective for menopausal symptoms., however it may interfere with the action of Tamoxifen. Therefore I do not recommend her. She has not tried yet.  Recommend her to continue follow up with GYN.   # Anxiety and hot flash.continue Effexor which potentially may help her vasomotor symptoms from taking Tamoxifen.  # # Hyperthyroidism: Per patient due to patient's ophthalmologic problems, plan of radioisotope ablation was put off.  She follows up with endocrinologist every 6 months.  On methimazole.  #Osteopenia, recommend calcium and vitamin D supplements. She has not obtained dental clearance yet. 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 S/p 2 rituximab treatment, and a course of systemic and now on ophthalmology steroid.    # Medi port she has been in remission for about 2 years after surgery. Patient prefers to remove medi port which I think is reasonable.  Refer to Dr.Byrnett to have medi port removed.    Return of visit: 3 months for reevaluation.  Check  CBC CMP at that time.  Sheena Server, MD, PhD Hematology Oncology [facility] Pager- [phone] 05/10/2019

## 2019-05-11 ENCOUNTER — Telehealth: Payer: Self-pay

## 2019-05-11 ENCOUNTER — Encounter: Payer: Self-pay | Admitting: Family Medicine

## 2019-05-11 NOTE — Telephone Encounter (Signed)
Copied from [place] [id number]. Topic: General - Inquiry >> May 10, 2019 11:31 AM Virl Axe D wrote: Reason for CRM: [employer] (on behalf of [place]) stated  the request for [place] has been denied. Offering a pelvic ultrasound instead. Cb#[phone] option 4

## 2019-05-11 NOTE — Telephone Encounter (Signed)
It is up to [place] on how she chooses to proceed, either switch order or do a peer-to-peer.

## 2019-05-12 ENCOUNTER — Other Ambulatory Visit: Payer: Self-pay

## 2019-05-12 ENCOUNTER — Ambulatory Visit: Payer: Medicaid Other | Admitting: Surgery

## 2019-05-12 ENCOUNTER — Ambulatory Visit: Payer: Medicaid Other | Admitting: Physical Therapy

## 2019-05-12 ENCOUNTER — Encounter: Payer: Self-pay | Admitting: Physical Therapy

## 2019-05-12 DIAGNOSIS — G8929 Other chronic pain: Secondary | ICD-10-CM | POA: Diagnosis not present

## 2019-05-12 DIAGNOSIS — M25571 Pain in right ankle and joints of right foot: Secondary | ICD-10-CM

## 2019-05-12 DIAGNOSIS — M25562 Pain in left knee: Secondary | ICD-10-CM | POA: Diagnosis not present

## 2019-05-12 DIAGNOSIS — M25561 Pain in right knee: Secondary | ICD-10-CM | POA: Diagnosis not present

## 2019-05-12 DIAGNOSIS — M6281 Muscle weakness (generalized): Secondary | ICD-10-CM | POA: Diagnosis not present

## 2019-05-12 NOTE — Therapy (Signed)
[facility] [address] [place], [address] Phone: [phone]   Fax:  [phone]  Physical Therapy Treatment  Patient Details  Name: Sheena Martinez MRN: [id number] Date of Birth: 24-Sep-1960 Referring Provider (PT): Wyatt Portela, PA-C   Encounter Date: 05/12/2019  PT End of Session - 05/12/19 1513    Visit Number  2    Number of Visits  12    Date for PT Re-Evaluation  06/30/19    Authorization Type  Medicaid reporting from 05/04/2019    Authorization Time Period  certification period: 05/04/2019 - 06/15/2019    Authorization - Visit Number  2    Authorization - Number of Visits  1    PT Start Time  [id number]    PT Stop Time  1600    PT Time Calculation (min)  45 min    Activity Tolerance  Patient tolerated treatment well;No increased pain    Behavior During Therapy  WFL for tasks assessed/performed       Past Medical History:  Diagnosis Date  . Anxiety   . Arthritis   . 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 GERD (gastroesophageal reflux disease)    OCC  . Goiter   . Hematuria 10/21/2018  . Hyperthyroidism   . Personal history of radiation therapy   . Sjogren's syndrome ([place])   . Uses continuous positive airway pressure (CPAP) ventilation at home 04/30/2019    Past Surgical History:  Procedure Laterality Date  . BREAST BIOPSY Left 05/12/2017   [place] bx/ INVASIVE MAMMARY CARCINOMA  . BREAST LUMPECTOMY Left 05/2017   invasive mammary carcinoma, neg margins  . BREAST LUMPECTOMY WITH SENTINEL LYMPH NODE BIOPSY Left 06/10/2017   Procedure: BREAST LUMPECTOMY WITH SENTINEL LYMPH NODE BX AND NEEDLE LOCALIZATION;  Surgeon: Robert Bellow, MD;  Location: [facility] OR[facility];  Service: General;  Laterality: Left;  . BREAST MAMMOSITE Left  06/24/2017   Procedure: MAMMOSITE BREAST;  Surgeon: Robert Bellow, MD;  Location: [facility] OR[facility];  Service: General;  Laterality: Left;  . CESAREAN SECTION  1995  . 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 COSMETIC SURGERY    . CYST EXCISION  2018   pilar cyst/ Dr Will Bonnet  . EYE SURGERY    . PORTACATH PLACEMENT Right 07/08/2017   Procedure: INSERTION PORT-A-CATH;  Surgeon: Robert Bellow, MD;  Location: [facility] ORS;  Service: General;  Laterality: Right;  . TONSILLECTOMY      There were no vitals filed for this visit.  Subjective Assessment - 05/12/19 1512    Subjective  Patient has only been able to practice her HEP twice in the past 10 days. Patient also reports difficulties with sleeping and her pelvic problems. R ankle pain at lateral ankle rated 1/10 when walking.    Pertinent History  Patient is a 58 y.o. female who presents to outpatient physical therapy with a referral for medical diagnosis of bilateral primary osteoarthritis of knee and other specified arthritis, right ankle and foot. This patient's chief complaints consist of constant bilateral knee pain and occasional R ankle pain that has been gradually increasing in severity since insidious onset about 4 years ago, leading to the following functional deficits: difficulty prolonged sitting, standing, walking, quality of life, lifting, etc.. 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 Past surgeries include: breast lumpectomy 2018, breast mammosite 2018, corneal transplant 2019 L eye. (See more details in chart.)    Limitations  Sitting;Standing;Walking    How long can you sit comfortably?  60 minutes    How long can you stand comfortably?  45 minutes    How long can you walk comfortably?  30 minutes    Diagnostic  tests  X-ray Impression: Advanced medial and patellofemoral osteoarthritis at R knee    Patient Stated Goals  reduce bilateral knee pain and able to go to grocery store shopping for at least 2 hours    Currently in Pain?  Yes    Pain Score  4     Pain Location  Knee    Pain Orientation  Right;Anterior;Medial    Pain Descriptors / Indicators  Aching    Pain Onset  More than a month ago    Pain Onset  More than a month ago        Therapeutic exercise:to centralize symptoms and improve ROM, strength, muscular endurance, and activity tolerance required for successful completion of functional activities.  - LE strengthening              - Sidelying R/L hip abduction 3x15 reps              - Supine bridge 2x15 reps.   - Supine anterior and posterior pelvic tilt x10 minutes with extensive tactile cuing. Cuing provided for forms, muscle activation patterns, as well as modifications. - Patient was educated on diagnosis, anatomy and pathology involved, prognosis, role of PT, and was given an updated HEP, demonstrating exercise with proper form following verbal and tactile cues, and was given a paper hand out to continue exercise at home. Pt was educated on and agreed to plan of care. - Extensive education and instructions provided for pelvic tilts with bridges to prevent back pain as well as proper body mechanics to ensure effectiveness for performing HEP at home to allow patient to continue strengthening glutes and hamstrings to improve knee pain. Patient unable to decouple PPT with bridge exercise and therefore unable to determine if neutral bridge would be more comfortable for patient.  - extensive discussion about patient's concerns related to pelvic pain, ankle pain, and knee pain to help patient understand her plan of care and to guide today's treatment session. Patient agreed to focus on treatment of knee pain at this point with possible assistance with accountability to improve her participation  in previous HEP for pelvic pain to allow her improved tolerance for knee/ankle treatment and improved quality of life.  HOME EXERCISE PROGRAM Access Code: [email or web address]  URL: [email or web address]  Date: 05/04/2019  Prepared by: Rosita Kea   Exercises  Sidelying Hip Abduction - 2 sets - 10 reps - 2x daily - 7x weekly  Beginner Bridge - 2 sets - 10 reps - 2x daily - 7x weekly    Clinical impression:  Patient tolerated the session well and didn't have increase of pain at L knee or ankle at the end. Patient able to teach back to her exercise instructions from the first evaluation. However, extensive time spent on patient education about her current health conditions as well as proper muscle activation pattern with pelvic tilts which important to be addressed to improve proper body mechanics to reduce pain at improve  strengthening at her knees. The patient will benefit from skilled PT services to address patient deficits and return to PLOF at home, recreational activity and work.      PT Education - 05/12/19 1513    Education Details  Exercise purpose/form. Self management techniques.    Person(s) Educated  Patient    Methods  Explanation;Demonstration;Tactile cues;Verbal cues    Comprehension  Verbalized understanding;Returned demonstration;Verbal cues required;Tactile cues required       PT Short Term Goals - 05/04/19 2005      PT SHORT TERM GOAL #1   Title  Be independent with initial home exercise program for self-management of symptoms.    Baseline  initial HEP provided at IE (05/04/2019);    Time  2    Period  Weeks    Status  New    Target Date  05/18/19        PT Long Term Goals - 05/04/19 2009      PT LONG TERM GOAL #1   Title  Be independent with a long-term home exercise program for self-management of symptoms.    Baseline  initial HEP provided at IE (05/04/2019);    Time  8    Period  Weeks    Status  New    Target Date  06/30/19      PT LONG TERM  GOAL #2   Title  Demonstrate improved FOTO score by 10 units to demonstrate improvement in overall condition and self-reported functional ability.    Baseline  FOTO 51 MCD 9(05/04/2019);    Time  8    Period  Weeks    Status  New    Target Date  06/30/19      PT LONG TERM GOAL #3   Title  Reduce pain with functional activities to equal or less than 1/10 to allow patient to complete usual activities including ADLs, IADLs, and social engagement with less difficulty.    Baseline  4/10 currently(05/04/2019);    Time  8    Period  Weeks    Status  New    Target Date  06/30/19      PT LONG TERM GOAL #4   Title  Complete community, work and/or recreational activities without limitation due to current condition.    Baseline  Difficulties go to grocery shopping; long time sitting, walking, sleeping(05/04/2019);    Time  8    Period  Weeks    Status  New    Target Date  06/30/19      PT LONG TERM GOAL #5   Title  Demonstrated at least 1 grade improvement for MMT to improve functional mobility and strength.    Baseline  See objectives (05/04/2019);    Time  8    Period  Weeks    Status  New    Target Date  06/30/19            Plan - 05/12/19 1514    Clinical Impression Statement  Patient tolerated the session well and didn't have increase of pain at L knee or ankle at the end. Patient able to teach back to her exercise instructions from the first evaluation. However, extensive time spent on patient education about her current health conditions as well as proper muscle activation pattern with pelvic tilts which important to be addressed to improve proper body mechanics to reduce pain at improve strengthening at her knees. The patient will benefit from skilled PT services to address patient deficits and return to  PLOF at home, recreational activity and work.    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, anxiety, hyperthyroidism    Examination-Activity Limitations  Sleep;Sit;Stand;Caring for Others;Squat    Examination-Participation Restrictions  Community Activity;Shop;Other   sleeping on her bed   Stability/Clinical Decision Making  Evolving/Moderate complexity    Rehab Potential  Good    PT Frequency  2x / week    PT Duration  8 weeks    PT Treatment/Interventions  Joint Manipulations;ADLs/Self Care Home Management;Cryotherapy;Therapeutic activities;Functional mobility training;Therapeutic exercise;Balance training;Neuromuscular re-education;Patient/family education;Manual techniques;Passive range of motion;Dry needling;Other (comment);Moist Heat;Electrical Stimulation;Gait training   joint mobilization grades I-V   PT Next Visit Plan  Muscle LE strength, endurance, as well as pain control .    PT Home Exercise Plan  Medbridge: [email or web address]    Consulted and Agree with Plan of Care  Patient       Patient will benefit from skilled therapeutic intervention in order to improve the following deficits and impairments:  Decreased balance, Decreased endurance, Abnormal gait, Increased muscle spasms, Decreased activity tolerance, Decreased strength, Pain, Improper body mechanics, Difficulty walking, Obesity, Impaired perceived functional ability, Impaired flexibility  Visit Diagnosis: Chronic pain of right knee  Chronic pain of left knee  Pain in right ankle and joints of right foot  Muscle weakness (generalized)     Problem List Patient Active Problem List   Diagnosis Date Noted  . Osteopenia 03/18/2019  . Facial flushing 10/21/2018  . Tinnitus of both ears 10/21/2018  . Drug-induced neutropenia ([place]) 10/20/2018  . 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 Thrombocytosis ([place]) 10/20/2018  . 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 Irregular menstrual cycle 03/02/2018  . Screening for cervical cancer 03/02/2018  . Long-term current use of tamoxifen 02/17/2018  . 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 Anxiety 10/03/2017  . Bilateral primary osteoarthritis of knee 10/03/2017  . 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 Malignant neoplasm of upper-inner quadrant of left breast in female, estrogen receptor positive ([place]) 05/22/2017  . Hirsutism 11/24/2015  . Hyperlipidemia 02/01/2014  . Impaired glucose tolerance 02/01/2014  . Vitamin D deficiency 02/01/2014  . Family history of diabetes mellitus 01/31/2014  . Morbid obesity with BMI of 40.0-44.9, adult ([place]) 01/31/2014  . Sjogren's syndrome ([place]) 01/17/2014    Sherrilyn Rist, SPT 05/12/19, 7:51 PM  Everlean Alstrom. Graylon Good, PT, DPT 05/12/19, 7:51 PM  [facility] [address], [place], [address] Phone: [phone]   Fax:  [phone]  Name: Sheena Martinez MRN: [id number] Date of Birth: January 28, 1961

## 2019-05-12 NOTE — Telephone Encounter (Signed)
Spoke to Ronaldo Miyamoto., nurse clinical reviewer with [facility] and submitted additional information for review regarding this patient's US Renal denial. She said that we should have an answer in 1-2 days.  Then we can decide on how to proceed.

## 2019-05-12 NOTE — Telephone Encounter (Signed)
I just got a call from centralized scheduling wanting to know what to do about this patient's upcoming images that is scheduled for this Friday.  Do you want to just change it to a reg ultrasound pelvis or do you want to cancel all together?  Just let me know b/c I have to call the pre-service center back with your answer.  Thanks

## 2019-05-12 NOTE — Telephone Encounter (Signed)
This was ordered by Florina Ou. Please review with her. Thanks.

## 2019-05-13 DIAGNOSIS — G4733 Obstructive sleep apnea (adult) (pediatric): Secondary | ICD-10-CM | POA: Diagnosis not present

## 2019-05-14 ENCOUNTER — Ambulatory Visit: Payer: Medicaid Other

## 2019-05-17 ENCOUNTER — Telehealth: Payer: Self-pay | Admitting: Family Medicine

## 2019-05-17 NOTE — Telephone Encounter (Signed)
Lesia saw on 9/21. Do she want to recheck

## 2019-05-17 NOTE — Telephone Encounter (Signed)
Pt called in to ask if provider could put in a order so that she can have her urine double checked? pt would like to drop off a sample.    Please assist. [id number]

## 2019-05-18 ENCOUNTER — Telehealth: Payer: Self-pay

## 2019-05-18 ENCOUNTER — Other Ambulatory Visit: Payer: Self-pay

## 2019-05-18 ENCOUNTER — Ambulatory Visit: Payer: Medicaid Other | Attending: Family Medicine | Admitting: Physical Therapy

## 2019-05-18 ENCOUNTER — Encounter: Payer: Self-pay | Admitting: Physical Therapy

## 2019-05-18 DIAGNOSIS — M25561 Pain in right knee: Secondary | ICD-10-CM | POA: Insufficient documentation

## 2019-05-18 DIAGNOSIS — G8929 Other chronic pain: Secondary | ICD-10-CM | POA: Diagnosis not present

## 2019-05-18 DIAGNOSIS — M25562 Pain in left knee: Secondary | ICD-10-CM | POA: Diagnosis not present

## 2019-05-18 DIAGNOSIS — M25571 Pain in right ankle and joints of right foot: Secondary | ICD-10-CM | POA: Insufficient documentation

## 2019-05-18 DIAGNOSIS — M6281 Muscle weakness (generalized): Secondary | ICD-10-CM | POA: Diagnosis not present

## 2019-05-18 NOTE — Telephone Encounter (Signed)
Pt.notified

## 2019-05-18 NOTE — Therapy (Signed)
[facility] [address] [place], [address] Phone: [phone]   Fax:  [phone]  Physical Therapy Treatment  Patient Details  Name: Sheena Martinez MRN: [id number] Date of Birth: Mar 29, 1961 Referring Provider (PT): Wyatt Portela, PA-C   Encounter Date: 05/18/2019  PT End of Session - 05/18/19 1358    Visit Number  3    Number of Visits  12    Date for PT Re-Evaluation  06/30/19    Authorization Type  Medicaid reporting from 05/04/2019    Authorization Time Period  certification period: 05/04/2019 - 06/15/2019    Authorization - Visit Number  3    Authorization - Number of Visits  13    PT Start Time  [id number]    PT Stop Time  1430    PT Time Calculation (min)  41 min    Activity Tolerance  Patient tolerated treatment well;No increased pain    Behavior During Therapy  WFL for tasks assessed/performed       Past Medical History:  Diagnosis Date  . Anxiety   . Arthritis   . 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 GERD (gastroesophageal reflux disease)    OCC  . Goiter   . Hematuria 10/21/2018  . Hyperthyroidism   . Personal history of radiation therapy   . Sjogren's syndrome ([place])   . Uses continuous positive airway pressure (CPAP) ventilation at home 04/30/2019    Past Surgical History:  Procedure Laterality Date  . BREAST BIOPSY Left 05/12/2017   [place] bx/ INVASIVE MAMMARY CARCINOMA  . BREAST LUMPECTOMY Left 05/2017   invasive mammary carcinoma, neg margins  . BREAST LUMPECTOMY WITH SENTINEL LYMPH NODE BIOPSY Left 06/10/2017   Procedure: BREAST LUMPECTOMY WITH SENTINEL LYMPH NODE BX AND NEEDLE LOCALIZATION;  Surgeon: Robert Bellow, MD;  Location: [facility] ORS;  Service: General;  Laterality: Left;  . BREAST MAMMOSITE Left  06/24/2017   Procedure: MAMMOSITE BREAST;  Surgeon: Robert Bellow, MD;  Location: [facility] ORS;  Service: General;  Laterality: Left;  . CESAREAN SECTION  1995  . 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 COSMETIC SURGERY    . CYST EXCISION  2018   pilar cyst/ Dr Will Bonnet  . EYE SURGERY    . PORTACATH PLACEMENT Right 07/08/2017   Procedure: INSERTION PORT-A-CATH;  Surgeon: Robert Bellow, MD;  Location: [facility] ORS;  Service: General;  Laterality: Right;  . TONSILLECTOMY      There were no vitals filed for this visit.  Subjective Assessment - 05/18/19 1350    Subjective  Patient has been a lttle better, and has been doing her HEP daily. She has been doing more housekeeping works. R knee 3/10 when walking    Pertinent History  Patient is a 58 y.o. female who presents to outpatient physical therapy with a referral for medical diagnosis of bilateral primary osteoarthritis of knee and other specified arthritis, right ankle and foot. This patient's chief complaints consist of constant bilateral knee pain and occasional R ankle pain that has been gradually increasing in severity since insidious onset about 4 years ago, leading to the following functional deficits: difficulty prolonged sitting, standing, walking, quality of life, lifting, etc.. 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 Past surgeries include: breast lumpectomy 2018, breast mammosite 2018, corneal transplant 2019 L eye. (See more details in chart.)    Limitations  Sitting;Standing;Walking    How long can you sit comfortably?  60 minutes    How long can you stand comfortably?  45 minutes    How long can you walk comfortably?  30 minutes    Diagnostic tests  X-ray Impression: Advanced medial and patellofemoral  osteoarthritis at R knee    Patient Stated Goals  reduce bilateral knee pain and able to go to grocery store shopping for at least 2 hours    Currently in Pain?  Yes    Pain Score  3     Pain Location  Knee    Pain Orientation  Right;Left    Pain Onset  More than a month ago    Pain Onset  More than a month ago      Therapeutic exercise:to centralize symptoms and improve ROM, strength, muscular endurance, and activity tolerance required for successful completion of functional activities. - NuStep level 3 using lower extremities. Setting 7 sitting. For improved extremity mobility, muscular endurance, and activity tolerance; and to induce the analgesic effect of aerobic exercise, stimulate improved joint nutrition, and prepare body structures and systems for following interventions. x 6 minutes.  - LE strengthening  - Sidelying R/L hip abduction 3x15 reps (4lb weight added over gluteal to promote muscle activation) - Supine bridge 3x15 reps. (orange cone over belly)  - Quadruped hip extension alternating 5 reps each side. Patient unable to tolerate the pressure over the bilateral knees   Cuing provided for forms, muscle activation patterns, as well as modifications. - Core strengthening  - Supine modified bicycles/marching. 3x10 reps with 2lb ankle weight on each LE.  Cuing provided to improve TA activation as well as breathing.   - Body scanning for pain control by promote muscle relaxation and increase activity tolerance by reduced pain sensitivity.   HOME EXERCISE PROGRAM Access Code: [email or web address]  URL: [email or web address]  Date: 05/04/2019  Prepared by: Rosita Kea   Exercises  Sidelying Hip Abduction - 2 sets - 10 reps - 2x daily - 7x weekly  Beginner Bridge - 2 sets - 10 reps - 2x daily - 7x weekly   Clinical impression: Patient tolerated the session well and no complain of increased back pain at end of the session. Patient responded  well with body scanning techniques for pain control and muscle relaxation. Patient also demonstrated improved muscle activation pattern with additional cuing. Patient still requires excessive cuing to improve core strengthening and proper muscle activation. Later session will continue focuse on abdominal muscle activation, strengthening, and pain control. The patient will benefit from skilled PT services to address patient deficits and return to PLOF at home, recreational activity and work.     PT Education - 05/18/19 1358    Education Details  Exercise purpose/form. Self management techniques.    Person(s) Educated  Patient    Methods  Explanation;Demonstration;Tactile cues;Verbal cues    Comprehension  Verbalized understanding;Returned demonstration;Verbal cues required;Tactile cues required       PT Short Term Goals - 05/18/19 1554      PT SHORT TERM GOAL #1   Title  Be independent with initial home exercise program for self-management of symptoms.    Baseline  initial HEP provided at IE (05/04/2019);    Time  2  Period  Weeks    Status  Achieved    Target Date  05/18/19        PT Long Term Goals - 05/04/19 2009      PT LONG TERM GOAL #1   Title  Be independent with a long-term home exercise program for self-management of symptoms.    Baseline  initial HEP provided at IE (05/04/2019);    Time  8    Period  Weeks    Status  New    Target Date  06/30/19      PT LONG TERM GOAL #2   Title  Demonstrate improved FOTO score by 10 units to demonstrate improvement in overall condition and self-reported functional ability.    Baseline  FOTO 51 MCD 9(05/04/2019);    Time  8    Period  Weeks    Status  New    Target Date  06/30/19      PT LONG TERM GOAL #3   Title  Reduce pain with functional activities to equal or less than 1/10 to allow patient to complete usual activities including ADLs, IADLs, and social engagement with less difficulty.    Baseline  4/10  currently(05/04/2019);    Time  8    Period  Weeks    Status  New    Target Date  06/30/19      PT LONG TERM GOAL #4   Title  Complete community, work and/or recreational activities without limitation due to current condition.    Baseline  Difficulties go to grocery shopping; long time sitting, walking, sleeping(05/04/2019);    Time  8    Period  Weeks    Status  New    Target Date  06/30/19      PT LONG TERM GOAL #5   Title  Demonstrated at least 1 grade improvement for MMT to improve functional mobility and strength.    Baseline  See objectives (05/04/2019);    Time  8    Period  Weeks    Status  New    Target Date  06/30/19            Plan - 05/18/19 1554    Clinical Impression Statement  Patient tolerated the session well and no complain of increased back pain at end of the session. Patient responded well with body scanning techniques for pain control and muscle relaxation. Patient also demonstrated improved muscle activation pattern with additional cuing. Patient still requires excessive cuing to improve core strengthening and proper muscle activation. Later session will continue focuse on abdominal muscle activation, strengthening, and pain control. The patient will benefit from skilled PT services to address patient deficits and return to PLOF at home, recreational activity and work.    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, anxiety, hyperthyroidism    Examination-Activity Limitations  Sleep;Sit;Stand;Caring for Others;Squat    Examination-Participation Restrictions  Community Activity;Shop;Other   sleeping on her bed   Stability/Clinical Decision Making  Evolving/Moderate complexity    Rehab Potential  Good    PT Frequency  2x / week    PT Duration  8 weeks    PT  Treatment/Interventions  Joint Manipulations;ADLs/Self Care Home Management;Cryotherapy;Therapeutic activities;Functional mobility training;Therapeutic exercise;Balance training;Neuromuscular re-education;Patient/family education;Manual techniques;Passive range of motion;Dry needling;Other (comment);Moist Heat;Electrical Stimulation;Gait training   joint mobilization grades I-V   PT Next Visit Plan  Muscle LE strength, endurance, as well as pain control .    PT Home Exercise Plan  Medbridge: [email or web address]    Consulted and Agree with Plan of Care  Patient       Patient will benefit from skilled therapeutic intervention in order to improve the following deficits and impairments:  Decreased balance, Decreased endurance, Abnormal gait, Increased muscle spasms, Decreased activity tolerance, Decreased strength, Pain, Improper body mechanics, Difficulty walking, Obesity, Impaired perceived functional ability, Impaired flexibility  Visit Diagnosis: Chronic pain of right knee  Chronic pain of left knee  Pain in right ankle and joints of right foot  Muscle weakness (generalized)     Problem List Patient Active Problem List   Diagnosis Date Noted  . Osteopenia 03/18/2019  . Facial flushing 10/21/2018  . Tinnitus of both ears 10/21/2018  . Drug-induced neutropenia ([place]) 10/20/2018  . 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 Thrombocytosis ([place]) 10/20/2018  . 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 Irregular menstrual cycle 03/02/2018  . Screening for cervical cancer 03/02/2018  . Long-term current use of tamoxifen 02/17/2018  . 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 Anxiety 10/03/2017  . Bilateral primary osteoarthritis of knee 10/03/2017  . 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 Malignant neoplasm of upper-inner quadrant of left breast in female, estrogen receptor positive ([place]) 05/22/2017  . Hirsutism 11/24/2015  . Hyperlipidemia  02/01/2014  . Impaired glucose tolerance 02/01/2014  . Vitamin D deficiency 02/01/2014  . Family history of diabetes mellitus 01/31/2014  . Morbid obesity with BMI of 40.0-44.9, adult ([employer]) 01/31/2014  . Sjogren's syndrome ([place]) 01/17/2014    Sherrilyn Rist, SPT 05/18/19, 4:26 PM  Everlean Alstrom. Graylon Good, PT, DPT 05/18/19, 4:26 PM  [facility] [address], [place], [address] Phone: [phone]   Fax:  [phone]  Name: Sheena Martinez MRN: [id number] Date of Birth: [birth year]

## 2019-05-18 NOTE — Telephone Encounter (Signed)
Copied from [place] [phone]. Topic: Referral - Status >> May 18, 2019  9:45 AM Scherrie Gerlach wrote: Reason for CRM:  Lannie with pre service called to advise the [place] scheduled for  the pt on Thursday has been denied. Cb : [id number]  ext [phone]   please call back to advise what to do.  Patient will be sent to urology for them to manage her symptoms and decide if she needs to proceed with imaging.

## 2019-05-19 ENCOUNTER — Telehealth: Payer: Self-pay | Admitting: Internal Medicine

## 2019-05-19 DIAGNOSIS — H0101A Ulcerative blepharitis right eye, upper and lower eyelids: Secondary | ICD-10-CM | POA: Diagnosis not present

## 2019-05-19 DIAGNOSIS — H0101B Ulcerative blepharitis left eye, upper and lower eyelids: Secondary | ICD-10-CM | POA: Diagnosis not present

## 2019-05-19 DIAGNOSIS — H18891 Other specified disorders of cornea, right eye: Secondary | ICD-10-CM | POA: Diagnosis not present

## 2019-05-19 DIAGNOSIS — H16002 Unspecified corneal ulcer, left eye: Secondary | ICD-10-CM | POA: Diagnosis not present

## 2019-05-19 DIAGNOSIS — H40052 Ocular hypertension, left eye: Secondary | ICD-10-CM | POA: Diagnosis not present

## 2019-05-19 DIAGNOSIS — G4733 Obstructive sleep apnea (adult) (pediatric): Secondary | ICD-10-CM

## 2019-05-19 NOTE — Telephone Encounter (Signed)
Called and spoke to [facility] with [employer].  Mitzie Na is requesting that pt's cpap pressure be changed to 5-20, as pt is unable to wear cpap due to pressure being too strong.  Current pressure is 15-20cm.  DK please advise if we can change order to 5-20cm. Former DR pt.

## 2019-05-19 NOTE — Telephone Encounter (Signed)
Please make changes

## 2019-05-19 NOTE — Telephone Encounter (Signed)
Order has been placed to [employer] to change cpap settings.  Left message to make [facility] aware.

## 2019-05-20 ENCOUNTER — Encounter: Payer: Self-pay | Admitting: Physical Therapy

## 2019-05-20 ENCOUNTER — Other Ambulatory Visit: Payer: Self-pay

## 2019-05-20 ENCOUNTER — Ambulatory Visit: Payer: Medicaid Other

## 2019-05-20 ENCOUNTER — Ambulatory Visit: Payer: Medicaid Other | Admitting: Physical Therapy

## 2019-05-20 DIAGNOSIS — M25561 Pain in right knee: Secondary | ICD-10-CM | POA: Diagnosis not present

## 2019-05-20 DIAGNOSIS — M25571 Pain in right ankle and joints of right foot: Secondary | ICD-10-CM

## 2019-05-20 DIAGNOSIS — M6281 Muscle weakness (generalized): Secondary | ICD-10-CM

## 2019-05-20 DIAGNOSIS — G8929 Other chronic pain: Secondary | ICD-10-CM

## 2019-05-20 DIAGNOSIS — M25562 Pain in left knee: Secondary | ICD-10-CM

## 2019-05-20 NOTE — Telephone Encounter (Signed)
Mitzie Na is aware of below message and voiced her understanding.  Nothing further is needed.

## 2019-05-20 NOTE — Telephone Encounter (Signed)
Left message x2 for pt. 

## 2019-05-20 NOTE — Therapy (Signed)
[facility] [address], [place], [address] Phone: [phone]   Fax:  [phone]  Physical Therapy Treatment  Patient Details  Name: Sheena Martinez MRN: [id number] Date of Birth: [birth year] Referring Provider (PT): Wyatt Portela, PA-C   Encounter Date: 05/20/2019  PT End of Session - 05/20/19 1836    Visit Number  4    Number of Visits  12    Date for PT Re-Evaluation  06/30/19    Authorization Type  Medicaid reporting from 05/04/2019    Authorization Time Period  certification period: 05/04/2019 - 06/15/2019    Authorization - Visit Number  4    Authorization - Number of Visits  13    PT Start Time  [id number]    PT Stop Time  1917    PT Time Calculation (min)  50 min    Activity Tolerance  Patient tolerated treatment well;No increased pain    Behavior During Therapy  WFL for tasks assessed/performed       Past Medical History:  Diagnosis Date  . Anxiety   . Arthritis   . 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 GERD (gastroesophageal reflux disease)    OCC  . Goiter   . Hematuria 10/21/2018  . Hyperthyroidism   . Personal history of radiation therapy   . Sjogren's syndrome ([place])   . Uses continuous positive airway pressure (CPAP) ventilation at home 04/30/2019    Past Surgical History:  Procedure Laterality Date  . BREAST BIOPSY Left 05/12/2017   [place] bx/ INVASIVE MAMMARY CARCINOMA  . BREAST LUMPECTOMY Left 05/2017   invasive mammary carcinoma, neg margins  . BREAST LUMPECTOMY WITH SENTINEL LYMPH NODE BIOPSY Left 06/10/2017   Procedure: BREAST LUMPECTOMY WITH SENTINEL LYMPH NODE BX AND NEEDLE LOCALIZATION;  Surgeon: Robert Bellow, MD;  Location: [facility] OR[facility];  Service: General;  Laterality: Left;  . BREAST MAMMOSITE Left  06/24/2017   Procedure: MAMMOSITE BREAST;  Surgeon: Robert Bellow, MD;  Location: [facility] OR[facility];  Service: General;  Laterality: Left;  . CESAREAN SECTION  1995  . 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 COSMETIC SURGERY    . CYST EXCISION  2018   pilar cyst/ Dr Will Bonnet  . EYE SURGERY    . PORTACATH PLACEMENT Right 07/08/2017   Procedure: INSERTION PORT-A-CATH;  Surgeon: Robert Bellow, MD;  Location: [facility] OR[facility];  Service: General;  Laterality: Right;  . TONSILLECTOMY      There were no vitals filed for this visit.  Subjective Assessment - 05/20/19 1832    Subjective  Patient felt good right after the session but started bilateral knee pain yesterday after walking in the [employer]. Patient states frustrations about her eyes, pelvic, and knees pain everyday when she woke up. She is pretty upset and has high anxiety about her current condition. Patient states the steroid help her pain but once she stopped taking the medication her pain came back to her. Patient rated 2/10 at biateral knees today after taking pain medication.    Pertinent History  Patient is a 58 y.o. female who presents to outpatient physical therapy with a referral for medical diagnosis of bilateral primary osteoarthritis of knee and other specified arthritis, right ankle and foot. This patient's chief complaints consist of constant bilateral  knee pain and occasional R ankle pain that has been gradually increasing in severity since insidious onset about 4 years ago, leading to the following functional deficits: difficulty prolonged sitting, standing, walking, quality of life, lifting, etc.. 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 Past surgeries include: breast lumpectomy 2018, breast mammosite 2018, corneal  transplant 2019 L eye. (See more details in chart.)    Limitations  Sitting;Standing;Walking    How long can you sit comfortably?  60 minutes    How long can you stand comfortably?  45 minutes    How long can you walk comfortably?  30 minutes    Diagnostic tests  X-ray Impression: Advanced medial and patellofemoral osteoarthritis at R knee    Patient Stated Goals  reduce bilateral knee pain and able to go to grocery store shopping for at least 2 hours    Currently in Pain?  Yes    Pain Score  3     Pain Location  Knee    Pain Orientation  Right;Left    Pain Onset  More than a month ago    Pain Onset  More than a month ago       Therapeutic exercise:to centralize symptoms and improve ROM, strength, muscular endurance, and activity tolerance required for successful completion of functional activities. - LE strengthening  - Sidelying R/L hip abduction3x10reps (4lb weight added over gluteal to promote muscle activation) - Supine bridge3x10reps. (orange cone over belly)  Cuing provided for forms, muscle activation patterns, as well as modifications. - Core strengthening             - Supine modified bicycles/marching. 1x10 reps with 2lb ankle weight on each LE.  - Supine straight leg raise 2x10 on each side.  - Supine  Shorter arc over red bolster x20 reps each side.  - Body scanning for pain control by promote muscle relaxation and increase activity tolerance by reduced pain sensitivity.  - [employer] time spent on patient education per patient request on diagnosis, anatomy and pathology involved, prognosis, and role of PT. Pain neuroscience education was provided to help decrease fear avoidance and reduce anxiety while better understanding plan for recovery and role of PT.  Pt was educated on and agreed to plan of care.    HOME EXERCISE PROGRAM Access Code: [email or web address]  URL: [email or web address]  Date: 05/04/2019  Prepared by: Rosita Kea    Exercises  Sidelying Hip Abduction - 2 sets - 10 reps - 2x daily - 7x weekly  Beginner Bridge - 2 sets - 10 reps - 2x daily - 7x weekly   Clinical impression: Patient tolerated the session well and patient states reduced pain and relaxed at the end. Patient demonstrated increased muscle control and coordination with less cuing to her prescribed exercises. Patient also presented positive attitude towards her rehab outcome. Patient's session limited by patient's level of anxiety related her pain at eyes, pelvic region and knees but responded well with relaxation treatment for pain control. Later session will continue focuse on abdominal muscle activation, strengthening, and pain control. Thepatientwill benefit from skilled PT services to address patientdeficits and return to PLOF at home, recreational activity and work.      PT Education - 05/20/19 [place]    Education Details  Exercise purpose/form. Self management techniques. Pain processing and pain science education    Person(s) Educated  Patient    Methods  Explanation;Demonstration;Tactile cues;Verbal cues    Comprehension  Verbalized understanding;Returned demonstration;Verbal cues required;Tactile cues required       PT Short Term Goals - 05/18/19 1554      PT SHORT TERM GOAL #1   Title  Be independent with initial home exercise program for self-management of symptoms.    Baseline  initial HEP provided at IE (05/04/2019);    Time  2    Period  Weeks    Status  Achieved    Target Date  05/18/19        PT Long Term Goals - 05/04/19 2009      PT LONG TERM GOAL #1   Title  Be independent with a long-term home exercise program for self-management of symptoms.    Baseline  initial HEP provided at IE (05/04/2019);    Time  8    Period  Weeks    Status  New    Target Date  06/30/19      PT LONG TERM GOAL #2   Title  Demonstrate improved FOTO score by 10 units to demonstrate improvement in overall condition and  self-reported functional ability.    Baseline  FOTO 51 MCD 9(05/04/2019);    Time  8    Period  Weeks    Status  New    Target Date  06/30/19      PT LONG TERM GOAL #3   Title  Reduce pain with functional activities to equal or less than 1/10 to allow patient to complete usual activities including ADLs, IADLs, and social engagement with less difficulty.    Baseline  4/10 currently(05/04/2019);    Time  8    Period  Weeks    Status  New    Target Date  06/30/19      PT LONG TERM GOAL #4   Title  Complete community, work and/or recreational activities without limitation due to current condition.    Baseline  Difficulties go to grocery shopping; long time sitting, walking, sleeping(05/04/2019);    Time  8    Period  Weeks    Status  New    Target Date  06/30/19      PT LONG TERM GOAL #5   Title  Demonstrated at least 1 grade improvement for MMT to improve functional mobility and strength.    Baseline  See objectives (05/04/2019);    Time  8    Period  Weeks    Status  New    Target Date  06/30/19            Plan - 05/20/19 1836    Clinical Impression Statement  Patient tolerated the session well and patient states reduced pain and relaxed at the end. Patient demonstrated increased muscle control and coordination with less cuing to her prescribed exercises. Patient also presented positive attitude towards her rehab outcome. Patient's session limited by patient's level of anxiety related her pain at eyes, pelvic region and knees but responded well with relaxation treatment for pain control. Later session will continue focuse on abdominal muscle activation, strengthening, and pain control. The patient will benefit from skilled PT services to address patient deficits and return to PLOF at home, recreational activity and work.    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, anxiety, hyperthyroidism    Examination-Activity Limitations  Sleep;Sit;Stand;Caring for Others;Squat    Examination-Participation Restrictions  Community Activity;Shop;Other   sleeping on her bed   Stability/Clinical Decision Making  Evolving/Moderate complexity    Rehab Potential  Good    PT Frequency  2x / week    PT Duration  8 weeks    PT Treatment/Interventions  Joint Manipulations;ADLs/Self Care Home Management;Cryotherapy;Therapeutic activities;Functional mobility training;Therapeutic exercise;Balance training;Neuromuscular re-education;Patient/family education;Manual techniques;Passive range of motion;Dry needling;Other (comment);Moist Heat;Electrical Stimulation;Gait training   joint mobilization grades I-V   PT Next Visit Plan  Muscle LE strength, endurance, as well as pain control .    PT Home Exercise Plan  Medbridge: [email or web address]    Consulted and Agree with Plan of Care  Patient       Patient will benefit from skilled therapeutic intervention in order to improve the following deficits and impairments:  Decreased balance, Decreased endurance, Abnormal gait, Increased muscle spasms, Decreased activity tolerance, Decreased strength, Pain, Improper body mechanics, Difficulty walking, Obesity, Impaired perceived functional ability, Impaired flexibility  Visit Diagnosis: Chronic pain of right knee  Chronic pain of left knee  Pain in right ankle and joints of right foot  Muscle weakness (generalized)     Problem List Patient Active Problem List   Diagnosis Date Noted  . Osteopenia 03/18/2019  . Facial flushing 10/21/2018  . Tinnitus of both ears 10/21/2018  . Drug-induced neutropenia ([place]) 10/20/2018  . 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 Thrombocytosis ([place]) 10/20/2018  . 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 Irregular menstrual cycle  03/02/2018  . Screening for cervical cancer 03/02/2018  . Long-term current use of tamoxifen 02/17/2018  . 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 Anxiety 10/03/2017  . Bilateral primary osteoarthritis of knee 10/03/2017  . 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 Malignant neoplasm of upper-inner quadrant of left breast in female, estrogen receptor positive ([place]) 05/22/2017  . Hirsutism 11/24/2015  . Hyperlipidemia 02/01/2014  . Impaired glucose tolerance 02/01/2014  . Vitamin D deficiency 02/01/2014  . Family history of diabetes mellitus 01/31/2014  . Morbid obesity with BMI of 40.0-44.9, adult ([place]) 01/31/2014  . Sjogren's syndrome ([place]) 01/17/2014   Sherrilyn Rist, SPT 05/20/19, 8:16 PM  Everlean Alstrom. Graylon Good, PT, DPT 05/20/19, 8:18 PM    [facility] [address] [place], [address] Phone: [phone]   Fax:  [phone]  Name: IYLAH GHUMAN MRN: [id number] Date of Birth: Mar 01, 1961

## 2019-05-25 ENCOUNTER — Other Ambulatory Visit: Payer: Self-pay

## 2019-05-25 ENCOUNTER — Ambulatory Visit: Payer: Medicaid Other | Admitting: Physical Therapy

## 2019-05-25 ENCOUNTER — Encounter: Payer: Self-pay | Admitting: Physical Therapy

## 2019-05-25 DIAGNOSIS — G8929 Other chronic pain: Secondary | ICD-10-CM | POA: Diagnosis not present

## 2019-05-25 DIAGNOSIS — M25562 Pain in left knee: Secondary | ICD-10-CM

## 2019-05-25 DIAGNOSIS — F410 Panic disorder [episodic paroxysmal anxiety] without agoraphobia: Secondary | ICD-10-CM | POA: Diagnosis not present

## 2019-05-25 DIAGNOSIS — M25571 Pain in right ankle and joints of right foot: Secondary | ICD-10-CM

## 2019-05-25 DIAGNOSIS — F419 Anxiety disorder, unspecified: Secondary | ICD-10-CM | POA: Diagnosis not present

## 2019-05-25 DIAGNOSIS — M6281 Muscle weakness (generalized): Secondary | ICD-10-CM

## 2019-05-25 DIAGNOSIS — G4700 Insomnia, unspecified: Secondary | ICD-10-CM | POA: Diagnosis not present

## 2019-05-25 DIAGNOSIS — H9319 Tinnitus, unspecified ear: Secondary | ICD-10-CM | POA: Diagnosis not present

## 2019-05-25 DIAGNOSIS — M25561 Pain in right knee: Secondary | ICD-10-CM | POA: Diagnosis not present

## 2019-05-25 NOTE — Therapy (Signed)
[facility] [address], [place], [address] Phone: [phone]   Fax:  [phone]  Physical Therapy Treatment  Patient Details  Name: Sheena Martinez MRN: [id number] Date of Birth: Jan 02, 1961 Referring Provider (PT): Wyatt Portela, PA-C   Encounter Date: 05/25/2019  PT End of Session - 05/25/19 1443    Visit Number  5    Number of Visits  12    Date for PT Re-Evaluation  06/30/19    Authorization Type  Medicaid reporting from 05/04/2019    Authorization Time Period  certification period: 05/04/2019 - 06/15/2019    Authorization - Visit Number  5    Authorization - Number of Visits  13    PT Start Time  [id number]    PT Stop Time  [id number]    PT Time Calculation (min)  46 min    Activity Tolerance  Patient tolerated treatment well;No increased pain    Behavior During Therapy  WFL for tasks assessed/performed       Past Medical History:  Diagnosis Date  . Anxiety   . Arthritis   . 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 GERD (gastroesophageal reflux disease)    OCC  . Goiter   . Hematuria 10/21/2018  . Hyperthyroidism   . Personal history of radiation therapy   . Sjogren's syndrome ([place])   . Uses continuous positive airway pressure (CPAP) ventilation at home 04/30/2019    Past Surgical History:  Procedure Laterality Date  . BREAST BIOPSY Left 05/12/2017   [place] bx/ INVASIVE MAMMARY CARCINOMA  . BREAST LUMPECTOMY Left 05/2017   invasive mammary carcinoma, neg margins  . BREAST LUMPECTOMY WITH SENTINEL LYMPH NODE BIOPSY Left 06/10/2017   Procedure: BREAST LUMPECTOMY WITH SENTINEL LYMPH NODE BX AND NEEDLE LOCALIZATION;  Surgeon: Robert Bellow, MD;  Location: [facility] ORS;  Service: General;  Laterality: Left;  . BREAST MAMMOSITE Left  06/24/2017   Procedure: MAMMOSITE BREAST;  Surgeon: Robert Bellow, MD;  Location: [facility] ORS;  Service: General;  Laterality: Left;  . CESAREAN SECTION  1995  . 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 COSMETIC SURGERY    . CYST EXCISION  2018   pilar cyst/ Dr Will Bonnet  . EYE SURGERY    . PORTACATH PLACEMENT Right 07/08/2017   Procedure: INSERTION PORT-A-CATH;  Surgeon: Robert Bellow, MD;  Location: [facility] ORS;  Service: General;  Laterality: Right;  . TONSILLECTOMY      There were no vitals filed for this visit.  Subjective Assessment - 05/25/19 1347    Subjective  Patient rated pain at bilateral knees pain 3/10. Patient states did HEP over the weekend and felt really sore at bilteral legs.    Pertinent History  Patient is a 58 y.o. female who presents to outpatient physical therapy with a referral for medical diagnosis of bilateral primary osteoarthritis of knee and other specified arthritis, right ankle and foot. This patient's chief complaints consist of constant bilateral knee pain and occasional R ankle pain that has been gradually increasing in severity since insidious onset about 4 years ago, leading to the following functional deficits: difficulty prolonged sitting, standing, walking, quality of life, lifting, etc.. 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 Past surgeries include: breast lumpectomy 2018, breast mammosite 2018, corneal transplant 2019 L eye. (See more details in chart.)    Limitations  Sitting;Standing;Walking    How long can you sit comfortably?  60 minutes    How long can you stand comfortably?  45 minutes    How long can you walk comfortably?  30 minutes    Diagnostic tests  X-ray Impression: Advanced medial and patellofemoral osteoarthritis at  R knee    Patient Stated Goals  reduce bilateral knee pain and able to go to grocery store shopping for at least 2 hours    Currently in Pain?  Yes    Pain Score  3     Pain Location  Knee    Pain Orientation  Right;Left    Pain Onset  More than a month ago    Pain Onset  More than a month ago       Therapeutic exercise:to centralize symptoms and improve ROM, strength, muscular endurance, and activity tolerance required for successful completion of functional activities. - NuStep level 3 using B lower extremities. Setting 6. For improved extremity mobility, muscular endurance, and activity tolerance; and to induce the analgesic effect of aerobic exercise, stimulate improved joint nutrition, and prepare body structures and systems for following interventions. x 7 minutes.  - LE strengthening  - Seated BLE knee extension with 2lb ankle weigh   - Supine Gluteal activation 3x10 reps - Supine bridge1x10reps.(orange cone over belly)  - Standing forward step up/down with R/L LE leading 3x10 reps on each side.  - Standing squat 2x10 reps Cuing provided for forms, muscle activation patterns, as well as modifications. -Core strengthening - Supine modified bicycles/marching. 3x10 reps with 2lb ankle weight on each LE.  - Body scanning for pain control by promote muscle relaxation and increase activity tolerance by reduced pain sensitivity. -Education on HEP including handout     HOME EXERCISE PROGRAM Access Code: [email or web address]  URL: [email or web address]  Date: 05/25/2019  Prepared by: Rosita Kea   Exercises Sidelying Hip Abduction - 2 sets - 10 reps - 1x daily - 7x weekly Beginner Bridge - 2 sets - 10 reps - 1x daily - 7x weekly Supine March - 2 sets - 10 reps - 1x daily - 7x weekly Supine Knee Extension Strengthening - 2 sets - 10 reps - 1x daily - 7x weekly   Clinical impression: Patient tolerated the session well and keep progress  towards her goal. Patient demonstrated good progression towards her LE strengthening training in standing. However, patient still complains of bilateral knee pain with functional knee extension tasks during squat. Later session will continue focuse on abdominal muscle activation, strengthening, and pain control.Thepatientwill benefit from skilled PT services to address patientdeficits and return to PLOF at home, recreational activity and work.    PT Education - 05/25/19 1443    Education Details  Exercise purpose/form. Self management techniques. Pain processing and pain science education    Person(s) Educated  Patient    Methods  Explanation;Tactile cues;Demonstration;Verbal cues;Handout    Comprehension  Verbalized understanding;Returned demonstration;Verbal cues required;Tactile cues required       PT Short Term Goals - 05/18/19 1554      PT SHORT TERM GOAL #1   Title  Be independent with initial home exercise program for self-management of symptoms.    Baseline  initial HEP provided at IE (05/04/2019);    Time  2    Period  Weeks    Status  Achieved    Target Date  05/18/19        PT Long Term Goals - 05/04/19 2009      PT LONG TERM GOAL #1   Title  Be independent with a long-term home exercise program for self-management of symptoms.    Baseline  initial HEP provided at IE (05/04/2019);    Time  8    Period  Weeks    Status  New    Target Date  06/30/19      PT LONG TERM GOAL #2   Title  Demonstrate improved FOTO score by 10 units to demonstrate improvement in overall condition and self-reported functional ability.    Baseline  FOTO 51 MCD 9(05/04/2019);    Time  8    Period  Weeks    Status  New    Target Date  06/30/19      PT LONG TERM GOAL #3   Title  Reduce pain with functional activities to equal or less than 1/10 to allow patient to complete usual activities including ADLs, IADLs, and social engagement with less difficulty.    Baseline  4/10  currently(05/04/2019);    Time  8    Period  Weeks    Status  New    Target Date  06/30/19      PT LONG TERM GOAL #4   Title  Complete community, work and/or recreational activities without limitation due to current condition.    Baseline  Difficulties go to grocery shopping; long time sitting, walking, sleeping(05/04/2019);    Time  8    Period  Weeks    Status  New    Target Date  06/30/19      PT LONG TERM GOAL #5   Title  Demonstrated at least 1 grade improvement for MMT to improve functional mobility and strength.    Baseline  See objectives (05/04/2019);    Time  8    Period  Weeks    Status  New    Target Date  06/30/19            Plan - 05/25/19 1444    Clinical Impression Statement  Patient tolerated the session well and keep progress towards her goal. Patient demonstrated good progression towards her LE strengthening training in standing. However, patient still complains of bilateral knee pain with functional knee extension tasks during squat. Later session will continue focuse on abdominal muscle activation, strengthening, and pain control. The patient will benefit from skilled PT services to address patient deficits and return to PLOF at home, recreational activity and work.    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, anxiety, hyperthyroidism    Examination-Activity Limitations  Sleep;Sit;Stand;Caring for Others;Squat    Examination-Participation Restrictions  Community Activity;Shop;Other   sleeping on her bed   Stability/Clinical Decision Making  Evolving/Moderate complexity    Rehab Potential  Good    PT Frequency  2x / week    PT Duration  8 weeks    PT Treatment/Interventions  Joint Manipulations;ADLs/Self Care Home Management;Cryotherapy;Therapeutic  activities;Functional mobility training;Therapeutic exercise;Balance training;Neuromuscular re-education;Patient/family education;Manual techniques;Passive range of motion;Dry needling;Other (comment);Moist Heat;Electrical Stimulation;Gait training   joint mobilization grades I-V   PT Next Visit Plan  Muscle LE strength, endurance, as well as pain control .  PT Home Exercise Plan  Medbridge: [email or web address]    Consulted and Agree with Plan of Care  Patient       Patient will benefit from skilled therapeutic intervention in order to improve the following deficits and impairments:  Decreased balance, Decreased endurance, Abnormal gait, Increased muscle spasms, Decreased activity tolerance, Decreased strength, Pain, Improper body mechanics, Difficulty walking, Obesity, Impaired perceived functional ability, Impaired flexibility  Visit Diagnosis: Chronic pain of right knee  Chronic pain of left knee  Pain in right ankle and joints of right foot  Muscle weakness (generalized)     Problem List Patient Active Problem List   Diagnosis Date Noted  . Osteopenia 03/18/2019  . Facial flushing 10/21/2018  . Tinnitus of both ears 10/21/2018  . Drug-induced neutropenia ([place]) 10/20/2018  . 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 Thrombocytosis ([place]) 10/20/2018  . 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 Irregular menstrual cycle 03/02/2018  . Screening for cervical cancer 03/02/2018  . Long-term current use of tamoxifen 02/17/2018  . 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 Anxiety 10/03/2017  . Bilateral primary osteoarthritis of knee 10/03/2017  . 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 Malignant neoplasm of upper-inner quadrant of left breast in female, estrogen receptor positive ([place]) 05/22/2017  . Hirsutism 11/24/2015  . Hyperlipidemia 02/01/2014  . Impaired glucose tolerance 02/01/2014  . Vitamin D deficiency 02/01/2014  . Family  history of diabetes mellitus 01/31/2014  . Morbid obesity with BMI of 40.0-44.9, adult ([place]) 01/31/2014  . Sjogren's syndrome ([place]) 01/17/2014    Sherrilyn Rist, SPT 05/25/19, 2:46 PM  Everlean Alstrom. Graylon Good, PT, DPT 05/25/19, 3:35 PM   [facility] [address] [place], [address] Phone: [phone]   Fax:  [phone]  Name: Sheena Martinez MRN: [id number] Date of Birth: 1961-02-05

## 2019-05-31 ENCOUNTER — Ambulatory Visit: Payer: Medicaid Other | Admitting: Physical Therapy

## 2019-05-31 ENCOUNTER — Encounter: Payer: Self-pay | Admitting: Physical Therapy

## 2019-05-31 ENCOUNTER — Other Ambulatory Visit: Payer: Self-pay

## 2019-05-31 DIAGNOSIS — M25561 Pain in right knee: Secondary | ICD-10-CM | POA: Diagnosis not present

## 2019-05-31 DIAGNOSIS — M6281 Muscle weakness (generalized): Secondary | ICD-10-CM

## 2019-05-31 DIAGNOSIS — M25571 Pain in right ankle and joints of right foot: Secondary | ICD-10-CM

## 2019-05-31 DIAGNOSIS — G8929 Other chronic pain: Secondary | ICD-10-CM

## 2019-05-31 DIAGNOSIS — M25562 Pain in left knee: Secondary | ICD-10-CM

## 2019-05-31 NOTE — Therapy (Signed)
[facility] [address] [place], [address] Phone: [phone]   Fax:  [phone]  Physical Therapy Treatment  Patient Details  Name: Sheena Martinez MRN: [id number] Date of Birth: 1961-05-02 Referring Provider (PT): Wyatt Portela, PA-C   Encounter Date: 05/31/2019  PT End of Session - 05/31/19 1305    Visit Number  6    Number of Visits  12    Date for PT Re-Evaluation  06/30/19    Authorization Type  Medicaid reporting from 05/04/2019    Authorization Time Period  certification period: 05/04/2019 - 06/15/2019    Authorization - Visit Number  6    Authorization - Number of Visits  13    PT Start Time  1301    PT Stop Time  [id number]    PT Time Calculation (min)  44 min    Activity Tolerance  Patient tolerated treatment well;No increased pain    Behavior During Therapy  WFL for tasks assessed/performed       Past Medical History:  Diagnosis Date  . Anxiety   . Arthritis   . 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 GERD (gastroesophageal reflux disease)    OCC  . Goiter   . Hematuria 10/21/2018  . Hyperthyroidism   . Personal history of radiation therapy   . Sjogren's syndrome ([place])   . Uses continuous positive airway pressure (CPAP) ventilation at home 04/30/2019    Past Surgical History:  Procedure Laterality Date  . BREAST BIOPSY Left 05/12/2017   [place] bx/ INVASIVE MAMMARY CARCINOMA  . BREAST LUMPECTOMY Left 05/2017   invasive mammary carcinoma, neg margins  . BREAST LUMPECTOMY WITH SENTINEL LYMPH NODE BIOPSY Left 06/10/2017   Procedure: BREAST LUMPECTOMY WITH SENTINEL LYMPH NODE BX AND NEEDLE LOCALIZATION;  Surgeon: Robert Bellow, MD;  Location: [facility] ORS;  Service: General;  Laterality: Left;  . BREAST MAMMOSITE Left  06/24/2017   Procedure: MAMMOSITE BREAST;  Surgeon: Robert Bellow, MD;  Location: [facility] ORS;  Service: General;  Laterality: Left;  . CESAREAN SECTION  1995  . 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 COSMETIC SURGERY    . CYST EXCISION  2018   pilar cyst/ Dr Will Bonnet  . EYE SURGERY    . PORTACATH PLACEMENT Right 07/08/2017   Procedure: INSERTION PORT-A-CATH;  Surgeon: Robert Bellow, MD;  Location: [facility] ORS;  Service: General;  Laterality: Right;  . TONSILLECTOMY      There were no vitals filed for this visit.  Subjective Assessment - 05/31/19 1304    Subjective  Patient rated pain at bilateral knees pain 2/10. Patient did HEP every other day and will continue progress with HEP. Patient reports B knee stiffness after standing for a hour for her shower and daily skin care. She states she will have a scheuded appointment tomorrow to take out her portacath.    Pertinent History  Patient is a 58 y.o. female who presents to outpatient physical therapy with a referral for medical diagnosis of bilateral primary osteoarthritis of knee and other specified arthritis, right ankle and foot. This patient's chief complaints consist of constant bilateral knee pain and occasional R ankle pain that has been gradually increasing in severity since insidious onset about 4 years ago, leading to the following  functional deficits: difficulty prolonged sitting, standing, walking, quality of life, lifting, etc.. 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 Past surgeries include: breast lumpectomy 2018, breast mammosite 2018, corneal transplant 2019 L eye. (See more details in chart.)    Limitations  Sitting;Standing;Walking    How long can you sit comfortably?  60 minutes    How long can you stand  comfortably?  45 minutes    How long can you walk comfortably?  30 minutes    Diagnostic tests  X-ray Impression: Advanced medial and patellofemoral osteoarthritis at R knee    Patient Stated Goals  reduce bilateral knee pain and able to go to grocery store shopping for at least 2 hours    Currently in Pain?  Yes    Pain Score  2     Pain Onset  More than a month ago    Pain Onset  More than a month ago         Therapeutic exercise:to centralize symptoms and improve ROM, strength, muscular endurance, and activity tolerance required for successful completion of functional activities. - NuStep level 3 using B lower extremities. Setting 6. For improved extremity mobility, muscular endurance, and activity tolerance; and to induce the analgesic effect of aerobic exercise, stimulate improved joint nutrition, and prepare body structures and systems for following interventions. x 6 minutes.  - LE strengthening             - Seated BLE knee extension with 2lb ankle weight x 15 reps on each              - Standing Gluteal activation 2x10 reps  - Sit to stand x 10 reps with cuing to gluteal activation. Increase of B knee discomfort.  - Supine bridge3x10reps.(orange cone over belly)             - Standing forward step up/down with R/L LE leading 3x10 reps with each side leading.             - Standing squat 2x10 reps  Cuing provided for forms, muscle activation patterns, as well as modifications. -Core strengthening - Supine modified bicycles.1x5 reps with 2lb ankle weight on each LE.  - Supine straight leg raise 2x10 each side with 2 lb ankle weight on each LE.  - Body scanning for pain control by promote muscle relaxation and increase activity tolerance by reduced pain sensitivity. -Education provided for pain management with daily activities an    [place] Access Code: [email or web address]       URL: [email or web address]    Date: 05/25/2019   Prepared by: Rosita Kea   Exercises Sidelying Hip Abduction - 2 sets - 10 reps - 1x daily - 7x weekly Beginner Bridge - 2 sets - 10 reps - 1x daily - 7x weekly Supine March - 2 sets - 10 reps - 1x daily - 7x weekly Supine Knee Extension Strengthening - 2 sets - 10 reps - 1x daily - 7x weekly   Clinical impression:  Patient tolerated the session well andkeep progress towards her goal. Patient demonstrated good pain control throughout the session and no increase pain at the end session. Patient demonstrated improve activity tolerance with step up exercises. Patient also educated on importance of HEP at home and responded well with body scanning exercises for muscle relaxation and pain control. With the above  aforementioned improvements, later session will continue focuse on abdominal muscle activation, strengthening, and pain control.Thepatientwill benefit from skilled PT services to address patientdeficits and return to PLOF at home, recreational activity and work.     PT Education - 05/31/19 1304    Education Details  Exercise purpose/form. Self management techniques. Pain processing and pain science education    Person(s) Educated  Patient    Methods  Explanation;Demonstration;Tactile cues;Verbal cues    Comprehension  Verbalized understanding;Returned demonstration;Tactile cues required;Verbal cues required       PT Short Term Goals - 05/18/19 1554      PT SHORT TERM GOAL #1   Title  Be independent with initial home exercise program for self-management of symptoms.    Baseline  initial HEP provided at IE (05/04/2019);    Time  2    Period  Weeks    Status  Achieved    Target Date  05/18/19        PT Long Term Goals - 05/04/19 2009      PT LONG TERM GOAL #1   Title  Be independent with a long-term home exercise program for self-management of symptoms.    Baseline  initial HEP provided at IE (05/04/2019);    Time  8    Period  Weeks    Status  New    Target  Date  06/30/19      PT LONG TERM GOAL #2   Title  Demonstrate improved FOTO score by 10 units to demonstrate improvement in overall condition and self-reported functional ability.    Baseline  FOTO 51 MCD 9(05/04/2019);    Time  8    Period  Weeks    Status  New    Target Date  06/30/19      PT LONG TERM GOAL #3   Title  Reduce pain with functional activities to equal or less than 1/10 to allow patient to complete usual activities including ADLs, IADLs, and social engagement with less difficulty.    Baseline  4/10 currently(05/04/2019);    Time  8    Period  Weeks    Status  New    Target Date  06/30/19      PT LONG TERM GOAL #4   Title  Complete community, work and/or recreational activities without limitation due to current condition.    Baseline  Difficulties go to grocery shopping; long time sitting, walking, sleeping(05/04/2019);    Time  8    Period  Weeks    Status  New    Target Date  06/30/19      PT LONG TERM GOAL #5   Title  Demonstrated at least 1 grade improvement for MMT to improve functional mobility and strength.    Baseline  See objectives (05/04/2019);    Time  8    Period  Weeks    Status  New    Target Date  06/30/19            Plan - 05/31/19 1306    Clinical Impression Statement  Patient tolerated the session well and keep progress towards her goal. Patient demonstrated good pain control throughout the session and no increase pain at the end session. Patient demonstrated improve activity tolerance with step up exercises. Patient also educated on importance of HEP at home and responded well with body scanning exercises for muscle relaxation and pain control. With the above aforementioned improvements, later session will continue focuse on abdominal muscle activation, strengthening, and pain control.  The patient will benefit from skilled PT services to address patient deficits and return to PLOF at home, recreational activity and work.    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, anxiety, hyperthyroidism    Examination-Activity Limitations  Sleep;Sit;Stand;Caring for Others;Squat    Examination-Participation Restrictions  Community Activity;Shop;Other   sleeping on her bed   Stability/Clinical Decision Making  Evolving/Moderate complexity    Rehab Potential  Good    PT Frequency  2x / week    PT Duration  8 weeks    PT Treatment/Interventions  Joint Manipulations;ADLs/Self Care Home Management;Cryotherapy;Therapeutic activities;Functional mobility training;Therapeutic exercise;Balance training;Neuromuscular re-education;Patient/family education;Manual techniques;Passive range of motion;Dry needling;Other (comment);Moist Heat;Electrical Stimulation;Gait training   joint mobilization grades I-V   PT Next Visit Plan  Muscle LE strength, endurance, as well as pain control .    PT Home Exercise Plan  Medbridge: [email or web address]    Consulted and Agree with Plan of Care  Patient       Patient will benefit from skilled therapeutic intervention in order to improve the following deficits and impairments:  Decreased balance, Decreased endurance, Abnormal gait, Increased muscle spasms, Decreased activity tolerance, Decreased strength, Pain, Improper body mechanics, Difficulty walking, Obesity, Impaired perceived functional ability, Impaired flexibility  Visit Diagnosis: Chronic pain of right knee  Chronic pain of left knee  Pain in right ankle and joints of right foot  Muscle weakness (generalized)     Problem List Patient Active Problem List   Diagnosis Date Noted  . Osteopenia 03/18/2019  . Facial flushing 10/21/2018  . Tinnitus of both ears 10/21/2018  . Drug-induced neutropenia ([place]) 10/20/2018  . 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 Thrombocytosis ([place]) 10/20/2018  . 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 Irregular menstrual cycle 03/02/2018  . Screening for cervical cancer 03/02/2018  . Long-term current use of tamoxifen 02/17/2018  . 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 Anxiety 10/03/2017  . Bilateral primary osteoarthritis of knee 10/03/2017  . 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 Malignant neoplasm of upper-inner quadrant of left breast in female, estrogen receptor positive ([place]) 05/22/2017  . Hirsutism 11/24/2015  . Hyperlipidemia 02/01/2014  . Impaired glucose tolerance 02/01/2014  . Vitamin D deficiency 02/01/2014  . Family history of diabetes mellitus 01/31/2014  . Morbid obesity with BMI of 40.0-44.9, adult ([place]) 01/31/2014  . Sjogren's syndrome ([place]) 01/17/2014    Sherrilyn Rist, SPT 05/31/19, 1:56 PM  Everlean Alstrom. Graylon Good, PT, DPT 05/31/19, 2:10 PM   [facility] [address], [place], [address] Phone: [phone]   Fax:  [phone]  Name: KENNAH STOERMER MRN: [id number] Date of Birth: 1961-01-15

## 2019-06-01 DIAGNOSIS — Z853 Personal history of malignant neoplasm of breast: Secondary | ICD-10-CM | POA: Diagnosis not present

## 2019-06-02 ENCOUNTER — Ambulatory Visit: Payer: Medicaid Other | Admitting: Physical Therapy

## 2019-06-02 ENCOUNTER — Other Ambulatory Visit: Payer: Self-pay

## 2019-06-02 ENCOUNTER — Encounter: Payer: Self-pay | Admitting: Physical Therapy

## 2019-06-02 DIAGNOSIS — H0101B Ulcerative blepharitis left eye, upper and lower eyelids: Secondary | ICD-10-CM | POA: Diagnosis not present

## 2019-06-02 DIAGNOSIS — M25571 Pain in right ankle and joints of right foot: Secondary | ICD-10-CM | POA: Diagnosis not present

## 2019-06-02 DIAGNOSIS — G8929 Other chronic pain: Secondary | ICD-10-CM | POA: Diagnosis not present

## 2019-06-02 DIAGNOSIS — H18891 Other specified disorders of cornea, right eye: Secondary | ICD-10-CM | POA: Diagnosis not present

## 2019-06-02 DIAGNOSIS — M6281 Muscle weakness (generalized): Secondary | ICD-10-CM

## 2019-06-02 DIAGNOSIS — M25561 Pain in right knee: Secondary | ICD-10-CM | POA: Diagnosis not present

## 2019-06-02 DIAGNOSIS — M25562 Pain in left knee: Secondary | ICD-10-CM | POA: Diagnosis not present

## 2019-06-02 DIAGNOSIS — H0101A Ulcerative blepharitis right eye, upper and lower eyelids: Secondary | ICD-10-CM | POA: Diagnosis not present

## 2019-06-02 NOTE — Therapy (Signed)
[facility] [address] [place], [address] Phone: [phone]   Fax:  [phone]  Physical Therapy Treatment  Patient Details  Name: Sheena Martinez MRN: [id number] Date of Birth: May 31, 1961 Referring Provider (PT): Wyatt Portela, PA-C   Encounter Date: 06/02/2019  PT End of Session - 06/02/19 1307    Visit Number  7    Number of Visits  12    Date for PT Re-Evaluation  06/30/19    Authorization Type  Medicaid reporting from 05/04/2019    Authorization Time Period  certification period: 05/04/2019 - 06/15/2019    Authorization - Visit Number  7    Authorization - Number of Visits  13    PT Start Time  1301    PT Stop Time  [id number]    PT Time Calculation (min)  44 min    Activity Tolerance  Patient tolerated treatment well;No increased pain    Behavior During Therapy  WFL for tasks assessed/performed       Past Medical History:  Diagnosis Date  . Anxiety   . Arthritis   . 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 GERD (gastroesophageal reflux disease)    OCC  . Goiter   . Hematuria 10/21/2018  . Hyperthyroidism   . Personal history of radiation therapy   . Sjogren's syndrome ([place])   . Uses continuous positive airway pressure (CPAP) ventilation at home 04/30/2019    Past Surgical History:  Procedure Laterality Date  . BREAST BIOPSY Left 05/12/2017   [place] bx/ INVASIVE MAMMARY CARCINOMA  . BREAST LUMPECTOMY Left 05/2017   invasive mammary carcinoma, neg margins  . BREAST LUMPECTOMY WITH SENTINEL LYMPH NODE BIOPSY Left 06/10/2017   Procedure: BREAST LUMPECTOMY WITH SENTINEL LYMPH NODE BX AND NEEDLE LOCALIZATION;  Surgeon: Robert Bellow, MD;  Location: [facility];  Service: General;  Laterality: Left;  . BREAST MAMMOSITE Left  06/24/2017   Procedure: MAMMOSITE BREAST;  Surgeon: Robert Bellow, MD;  Location: [facility];  Service: General;  Laterality: Left;  . CESAREAN SECTION  1995  . 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 COSMETIC SURGERY    . CYST EXCISION  2018   pilar cyst/ Dr Will Bonnet  . EYE SURGERY    . PORTACATH PLACEMENT Right 07/08/2017   Procedure: INSERTION PORT-A-CATH;  Surgeon: Robert Bellow, MD;  Location: [facility] OR[facility];  Service: General;  Laterality: Right;  . TONSILLECTOMY      There were no vitals filed for this visit.  Subjective Assessment - 06/02/19 1304    Subjective  Patient reported at bilateral knees pain 1/10. Patient did HEP every other day and she has been feeling a little stifness at her knee recently after proloned sitting but not painful.    Pertinent History  Patient is a 58 y.o. female who presents to outpatient physical therapy with a referral for medical diagnosis of bilateral primary osteoarthritis of knee and other specified arthritis, right ankle and foot. This patient's chief complaints consist of constant bilateral knee pain and occasional R ankle pain that has been gradually increasing in severity since insidious onset about 4 years ago, leading to the following functional deficits: difficulty prolonged sitting, standing, walking, quality of life, lifting, etc.. 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 Past surgeries include: breast lumpectomy 2018, breast mammosite 2018, corneal transplant 2019 L eye. (See more details in chart.)    Limitations  Sitting;Standing;Walking    How long can you sit comfortably?  60 minutes    How long can you stand comfortably?  45 minutes    How long can you walk comfortably?  30 minutes    Diagnostic tests  X-ray  Impression: Advanced medial and patellofemoral osteoarthritis at R knee    Patient Stated Goals  reduce bilateral knee pain and able to go to grocery store shopping for at least 2 hours    Currently in Pain?  Yes    Pain Score  1     Pain Location  Knee    Pain Orientation  Right;Left    Pain Onset  More than a month ago    Pain Onset  More than a month ago       Therapeutic exercise:to centralize symptoms and improve ROM, strength, muscular endurance, and activity tolerance required for successful completion of functional activities. -Treadmill walk 1.73mph for improved extremity mobility, muscular endurance, and activity tolerance; and to induce the analgesic effect of aerobic exercise, stimulate improved joint nutrition, and prepare body structures and systems for following interventions. x91minutes.  - LE strengthening - Standing forward step up/down with R/L LE leading 2x20  reps with each side leading. (3 and 5 inch height) with 2lb ankle weight - Seated BLE knee extension with 2lb ankle weight x 20 reps on each   - Standing half squat 3x10 reps. Cuing provided to improve gluteal activation and controled knee flexion.  - Standing Gluteal activation 20 reps Cuing provided for forms, muscle activation patterns, as well as modifications. -Core strengthening.             - Supine straight leg raise 3x10 each side. - Body scanning for pain control by promote muscle relaxation and increase activity tolerance by reduced pain sensitivity.   HOME EXERCISE PROGRAM Access Code: [email or web address] URL: [email or web address] Date: 05/25/2019  Prepared by: Rosita Kea   Exercises Sidelying Hip Abduction - 2 sets - 10 reps - 1x daily - 7x weekly Beginner Bridge - 2 sets - 10 reps - 1x daily - 7x weekly Supine March - 2 sets - 10 reps - 1x daily - 7x weekly Supine Knee Extension Strengthening - 2 sets - 10 reps - 1x daily - 7x  weekly   Clinical impression:  Patient tolerated the session well andkeep progress towards her goal. Patient demonstrated good muscle activation as well as techniques for higher level functional activities (squat). Patient still requires cuing for self-care pain control as well as body mechanics to maintain the gain for reduction of the pain. With the above aforementioned improvements, later session will continue focus on abdominal muscle activation, strengthening, and pain control.Thepatientwill benefit from skilled PT services to address patientdeficits and return to PLOF at home, recreational activity and work.     PT Education - 06/02/19 1307    Education Details  Exercise purpose/form. Self management techniques.    Person(s) Educated  Patient    Methods  Explanation;Demonstration;Tactile cues;Verbal cues    Comprehension  Verbalized understanding;Returned demonstration;Verbal cues required;Tactile cues required       PT Short Term Goals - 05/18/19 1554      PT SHORT TERM GOAL #1   Title  Be independent with initial home exercise program for self-management of symptoms.    Baseline  initial HEP provided at IE (05/04/2019);    Time  2    Period  Weeks    Status  Achieved    Target Date  05/18/19        PT Long Term Goals - 05/04/19 2009      PT LONG TERM GOAL #1   Title  Be independent with a long-term home exercise program for self-management of symptoms.    Baseline  initial HEP provided at IE (05/04/2019);    Time  8    Period  Weeks    Status  New    Target Date  06/30/19      PT LONG TERM GOAL #2   Title  Demonstrate improved FOTO score by 10 units to demonstrate improvement in overall condition and self-reported functional ability.    Baseline  FOTO 51 MCD 9(05/04/2019);    Time  8    Period  Weeks    Status  New    Target Date  06/30/19      PT LONG TERM GOAL #3   Title  Reduce pain with functional activities to equal or less than 1/10 to allow  patient to complete usual activities including ADLs, IADLs, and social engagement with less difficulty.    Baseline  4/10 currently(05/04/2019);    Time  8    Period  Weeks    Status  New    Target Date  06/30/19      PT LONG TERM GOAL #4   Title  Complete community, work and/or recreational activities without limitation due to current condition.    Baseline  Difficulties go to grocery shopping; long time sitting, walking, sleeping(05/04/2019);    Time  8    Period  Weeks    Status  New    Target Date  06/30/19      PT LONG TERM GOAL #5   Title  Demonstrated at least 1 grade improvement for MMT to improve functional mobility and strength.    Baseline  See objectives (05/04/2019);    Time  8    Period  Weeks    Status  New    Target Date  06/30/19            Plan - 06/02/19 1649    Clinical Impression Statement  Patient tolerated the session well and keep progress towards her goal. Patient demonstrated good muscle activation as well as techniques for higher level functional activities (squat). Patient still requires cuing for self-care pain control as well as body mechanics to maintain the gain for reduction of the pain. With the above aforementioned improvements, later session will continue focus on abdominal muscle activation, strengthening, and pain control. The patient will benefit from skilled PT services to address patient deficits and return to PLOF at home, recreational activity and work.    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, anxiety, hyperthyroidism    Examination-Activity Limitations  Sleep;Sit;Stand;Caring for Others;Squat    Examination-Participation Restrictions  Community Activity;Shop;Other   sleeping on her bed   Stability/Clinical Decision Making  Evolving/Moderate  complexity    Rehab Potential  Good    PT Frequency  2x / week    PT Duration  8 weeks    PT Treatment/Interventions  Joint Manipulations;ADLs/Self Care  Home Management;Cryotherapy;Therapeutic activities;Functional mobility training;Therapeutic exercise;Balance training;Neuromuscular re-education;Patient/family education;Manual techniques;Passive range of motion;Dry needling;Other (comment);Moist Heat;Electrical Stimulation;Gait training   joint mobilization grades I-V   PT Next Visit Plan  Muscle LE strength, endurance, as well as pain control .    PT Home Exercise Plan  Medbridge: [email or web address]    Consulted and Agree with Plan of Care  Patient       Patient will benefit from skilled therapeutic intervention in order to improve the following deficits and impairments:  Decreased balance, Decreased endurance, Abnormal gait, Increased muscle spasms, Decreased activity tolerance, Decreased strength, Pain, Improper body mechanics, Difficulty walking, Obesity, Impaired perceived functional ability, Impaired flexibility  Visit Diagnosis: Chronic pain of right knee  Chronic pain of left knee  Pain in right ankle and joints of right foot  Muscle weakness (generalized)     Problem List Patient Active Problem List   Diagnosis Date Noted  . Osteopenia 03/18/2019  . Facial flushing 10/21/2018  . Tinnitus of both ears 10/21/2018  . Drug-induced neutropenia ([place]) 10/20/2018  . 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 Thrombocytosis ([place]) 10/20/2018  . 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 Irregular menstrual cycle 03/02/2018  . Screening for cervical cancer 03/02/2018  . Long-term current use of tamoxifen 02/17/2018  . 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 Anxiety 10/03/2017  . Bilateral primary osteoarthritis of knee 10/03/2017  . 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 Malignant neoplasm of upper-inner quadrant of left breast in  female, estrogen receptor positive ([place]) 05/22/2017  . Hirsutism 11/24/2015  . Hyperlipidemia 02/01/2014  . Impaired glucose tolerance 02/01/2014  . Vitamin D deficiency 02/01/2014  . Family history of diabetes mellitus 01/31/2014  . Morbid obesity with BMI of 40.0-44.9, adult ([place]) 01/31/2014  . Sjogren's syndrome ([place]) 01/17/2014    Sherrilyn Rist, SPT 06/02/19, 5:07 PM  Everlean Alstrom. Graylon Good, PT, DPT 06/02/19, 5:08 PM   [facility] [address], [place], [address] Phone: [phone]   Fax:  [phone]  Name: Sheena Martinez MRN: [id number] Date of Birth: 1961/06/17

## 2019-06-07 ENCOUNTER — Ambulatory Visit: Payer: Medicaid Other | Admitting: Physical Therapy

## 2019-06-07 ENCOUNTER — Other Ambulatory Visit: Payer: Self-pay

## 2019-06-07 DIAGNOSIS — M25562 Pain in left knee: Secondary | ICD-10-CM

## 2019-06-07 DIAGNOSIS — G8929 Other chronic pain: Secondary | ICD-10-CM

## 2019-06-07 DIAGNOSIS — M25561 Pain in right knee: Secondary | ICD-10-CM

## 2019-06-07 DIAGNOSIS — M6281 Muscle weakness (generalized): Secondary | ICD-10-CM

## 2019-06-07 DIAGNOSIS — M25571 Pain in right ankle and joints of right foot: Secondary | ICD-10-CM | POA: Diagnosis not present

## 2019-06-07 NOTE — Therapy (Signed)
[facility] [address], [place], [address] Phone: [phone]   Fax:  [phone]  Physical Therapy Treatment  Patient Details  Name: Sheena Martinez MRN: [id number] Date of Birth: 1961/03/11 Referring Provider (PT): Wyatt Portela, PA-C   Encounter Date: 06/07/2019  PT End of Session - 06/07/19 1310    Visit Number  8    Number of Visits  12    Date for PT Re-Evaluation  06/30/19    Authorization Type  Medicaid reporting from 05/04/2019    Authorization Time Period  certification period: 05/04/2019 - 06/15/2019    Authorization - Visit Number  8    Authorization - Number of Visits  13    PT Start Time  1301    PT Stop Time  [id number]    PT Time Calculation (min)  51 min    Activity Tolerance  Patient tolerated treatment well    Behavior During Therapy  [facility] for tasks assessed/performed       Past Medical History:  Diagnosis Date  . Anxiety   . Arthritis   . 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 GERD (gastroesophageal reflux disease)    OCC  . Goiter   . Hematuria 10/21/2018  . Hyperthyroidism   . Personal history of radiation therapy   . Sjogren's syndrome ([place])   . Uses continuous positive airway pressure (CPAP) ventilation at home 04/30/2019    Past Surgical History:  Procedure Laterality Date  . BREAST BIOPSY Left 05/12/2017   [place] bx/ INVASIVE MAMMARY CARCINOMA  . BREAST LUMPECTOMY Left 05/2017   invasive mammary carcinoma, neg margins  . BREAST LUMPECTOMY WITH SENTINEL LYMPH NODE BIOPSY Left 06/10/2017   Procedure: BREAST LUMPECTOMY WITH SENTINEL LYMPH NODE BX AND NEEDLE LOCALIZATION;  Surgeon: Robert Bellow, MD;  Location: [facility] ORS;  Service: General;  Laterality: Left;  . BREAST MAMMOSITE Left 06/24/2017    Procedure: MAMMOSITE BREAST;  Surgeon: Robert Bellow, MD;  Location: [facility] ORS;  Service: General;  Laterality: Left;  . CESAREAN SECTION  1995  . 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 COSMETIC SURGERY    . CYST EXCISION  2018   pilar cyst/ Dr Will Bonnet  . EYE SURGERY    . PORTACATH PLACEMENT Right 07/08/2017   Procedure: INSERTION PORT-A-CATH;  Surgeon: Robert Bellow, MD;  Location: [facility] ORS;  Service: General;  Laterality: Right;  . TONSILLECTOMY      There were no vitals filed for this visit.  Subjective Assessment - 06/07/19 1305    Subjective  Patient reports she is feeling well today and reports no pain. She didn't do her HEP several days in a row for various reasons. One day she cleaned due to company coming, the next day she had company, the following day she stressed about it all day but still didn't do it. She has exercises on her list to do list. She states she felt good following last treatment session.    Pertinent History  Patient is a 58 y.o. female who presents to outpatient physical therapy with a referral for medical diagnosis of bilateral primary osteoarthritis of knee and other specified arthritis, right ankle and foot. This patient's chief complaints consist of constant bilateral knee pain and occasional R ankle pain that  has been gradually increasing in severity since insidious onset about 4 years ago, leading to the following functional deficits: difficulty prolonged sitting, standing, walking, quality of life, lifting, etc.. 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 Past surgeries include: breast lumpectomy 2018, breast mammosite 2018, corneal transplant 2019 L eye. (See more details in chart.)    Limitations  Sitting;Standing;Walking    How long  can you sit comfortably?  60 minutes    How long can you stand comfortably?  45 minutes    How long can you walk comfortably?  30 minutes    Diagnostic tests  X-ray Impression: Advanced medial and patellofemoral osteoarthritis at R knee    Patient Stated Goals  reduce bilateral knee pain and able to go to grocery store shopping for at least 2 hours    Currently in Pain?  No/denies    Pain Onset  More than a month ago    Pain Onset  More than a month ago        Therapeutic exercise:to centralize symptoms and improve ROM, strength, muscular endurance, and activity tolerance required for successful completion of functional activities. -Treadmill walk 1.91mph for improved extremity mobility, muscular endurance, and activity tolerance; and to induce the analgesic effect of aerobic exercise, stimulate improved joint nutrition, and prepare body structures and systems for following interventions. x56minutes.   - LE strengthening - Standing forward step up/down with R/L LE leading 2x20  repswitheach side leading. (3 and 5 inch height) with 2lb ankle weight   - Seated BLE knee extension with 4lb ankle weight x 20 reps on each             - Standing half squat 3x10 reps. Cuing provided to improve gluteal activation and controled knee flexion.    - StandingGluteal activation 20 reps Cuing provided for form, muscle activation patterns, education on appropriate pain. . - Body scanning for pain control by promote muscle relaxation and increase activity tolerance by reduced pain sensitivity.  Neuromuscular Re-education: to improve, balance, postural strength, muscle activation patterns, and stabilization strength required for functional activities: - tandem standing ball toss 3x10 with each foot front to improve proprioception in B ankles and LEs as well as improve ankle strength. Cuing for how to complete exercise and practice in self-selected stance.   HOME  EXERCISE PROGRAM Access Code: [email or web address] URL: [email or web address] Date: 05/25/2019  Prepared by: Rosita Kea   Exercises Sidelying Hip Abduction - 2 sets - 10 reps - 1x daily - 7x weekly Beginner Bridge - 2 sets - 10 reps - 1x daily - 7x weekly Supine March - 2 sets - 10 reps - 1x daily - 7x weekly Supine Knee Extension Strengthening - 2 sets - 10 reps - 1x daily - 7x weekly   Clinical impression: Patient tolerated treatment well and continues to make progress towards goals. She arrived today with no pain and reported only mild sensation of soreness at ankle and B knees by end of session. Discussed strategies to prevent onset of pain while getting pedicure today. Pt reported mild discomfort in knees and L ankle following squats and tandem ball tosses but this improved following body scan. She was able to progress several exercises today with good success and improving form. Will continue focus on abdominal muscle activation, strengthening, and pain controlPatient would benefit from continued physical therapy to address  remaining impairments and functional limitations to work towards stated goals and return to PLOF or maximal functional independence.      PT Education - 06/07/19 1310    Education Details  Exercise purpose/form. Self management techniques.    Person(s) Educated  Patient    Methods  Explanation;Demonstration;Tactile cues;Verbal cues    Comprehension  Verbalized understanding;Returned demonstration       PT Short Term Goals - 05/18/19 1554      PT SHORT TERM GOAL #1   Title  Be independent with initial home exercise program for self-management of symptoms.    Baseline  initial HEP provided at IE (05/04/2019);    Time  2    Period  Weeks    Status  Achieved    Target Date  05/18/19        PT Long Term Goals - 05/04/19 2009      PT LONG TERM GOAL #1   Title  Be independent with a long-term home exercise program for self-management of  symptoms.    Baseline  initial HEP provided at IE (05/04/2019);    Time  8    Period  Weeks    Status  New    Target Date  06/30/19      PT LONG TERM GOAL #2   Title  Demonstrate improved FOTO score by 10 units to demonstrate improvement in overall condition and self-reported functional ability.    Baseline  FOTO 51 MCD 9(05/04/2019);    Time  8    Period  Weeks    Status  New    Target Date  06/30/19      PT LONG TERM GOAL #3   Title  Reduce pain with functional activities to equal or less than 1/10 to allow patient to complete usual activities including ADLs, IADLs, and social engagement with less difficulty.    Baseline  4/10 currently(05/04/2019);    Time  8    Period  Weeks    Status  New    Target Date  06/30/19      PT LONG TERM GOAL #4   Title  Complete community, work and/or recreational activities without limitation due to current condition.    Baseline  Difficulties go to grocery shopping; long time sitting, walking, sleeping(05/04/2019);    Time  8    Period  Weeks    Status  New    Target Date  06/30/19      PT LONG TERM GOAL #5   Title  Demonstrated at least 1 grade improvement for MMT to improve functional mobility and strength.    Baseline  See objectives (05/04/2019);    Time  8    Period  Weeks    Status  New    Target Date  06/30/19            Plan - 06/07/19 1405    Clinical Impression Statement  Patient tolerated treatment well and continues to make progress towards goals. She arrived today with no pain and reported only mild sensation of soreness at ankle and B knees by end of session. Discussed strategies to prevent onset of pain while getting pedicure today. Pt reported mild discomfort in knees and L ankle following squats and tandem ball tosses but this improved following body scan. She was able to progress several exercises today with good success and improving form. Will continue focus on abdominal muscle activation, strengthening, and pain  controlPatient would benefit from continued physical therapy to address remaining  impairments and functional limitations to work towards stated goals and return to PLOF or maximal functional independence.    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, anxiety, hyperthyroidism    Examination-Activity Limitations  Sleep;Sit;Stand;Caring for Others;Squat    Examination-Participation Restrictions  Community Activity;Shop;Other   sleeping on her bed   Stability/Clinical Decision Making  Evolving/Moderate complexity    Rehab Potential  Good    PT Frequency  2x / week    PT Duration  8 weeks    PT Treatment/Interventions  Joint Manipulations;ADLs/Self Care Home Management;Cryotherapy;Therapeutic activities;Functional mobility training;Therapeutic exercise;Balance training;Neuromuscular re-education;Patient/family education;Manual techniques;Passive range of motion;Dry needling;Other (comment);Moist Heat;Electrical Stimulation;Gait training   joint mobilization grades I-V   PT Next Visit Plan  Muscle LE strength, endurance, as well as pain control .    PT Home Exercise Plan  Medbridge: [email or web address]    Consulted and Agree with Plan of Care  Patient       Patient will benefit from skilled therapeutic intervention in order to improve the following deficits and impairments:  Decreased balance, Decreased endurance, Abnormal gait, Increased muscle spasms, Decreased activity tolerance, Decreased strength, Pain, Improper body mechanics, Difficulty walking, Obesity, Impaired perceived functional ability, Impaired flexibility  Visit Diagnosis: Chronic pain of right knee  Chronic pain of left knee  Pain in right ankle and joints of right foot  Muscle weakness (generalized)     Problem List Patient Active Problem  List   Diagnosis Date Noted  . Osteopenia 03/18/2019  . Facial flushing 10/21/2018  . Tinnitus of both ears 10/21/2018  . Drug-induced neutropenia ([place]) 10/20/2018  . 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 Thrombocytosis ([place]) 10/20/2018  . 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 Irregular menstrual cycle 03/02/2018  . Screening for cervical cancer 03/02/2018  . Long-term current use of tamoxifen 02/17/2018  . 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 Anxiety 10/03/2017  . Bilateral primary osteoarthritis of knee 10/03/2017  . 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 Malignant neoplasm of upper-inner quadrant of left breast in female, estrogen receptor positive ([place]) 05/22/2017  . Hirsutism 11/24/2015  . Hyperlipidemia 02/01/2014  . Impaired glucose tolerance 02/01/2014  . Vitamin D deficiency 02/01/2014  . Family history of diabetes mellitus 01/31/2014  . Morbid obesity with BMI of 40.0-44.9, adult ([place]) 01/31/2014  . Sjogren's syndrome ([place]) 01/17/2014   Everlean Alstrom. Graylon Good, PT, DPT 06/07/19, 2:06 PM  [place] [facility] [address], [place], [address] Phone: [phone]   Fax:  [phone]  Name: TANEHA VALENTINI MRN: [id number] Date of Birth: [birth year]

## 2019-06-09 ENCOUNTER — Ambulatory Visit: Payer: Medicaid Other | Admitting: Physical Therapy

## 2019-06-09 ENCOUNTER — Encounter: Payer: Self-pay | Admitting: Physical Therapy

## 2019-06-09 ENCOUNTER — Other Ambulatory Visit: Payer: Self-pay

## 2019-06-09 ENCOUNTER — Inpatient Hospital Stay: Payer: Medicaid Other

## 2019-06-09 DIAGNOSIS — G8929 Other chronic pain: Secondary | ICD-10-CM | POA: Diagnosis not present

## 2019-06-09 DIAGNOSIS — M25571 Pain in right ankle and joints of right foot: Secondary | ICD-10-CM

## 2019-06-09 DIAGNOSIS — M6281 Muscle weakness (generalized): Secondary | ICD-10-CM | POA: Diagnosis not present

## 2019-06-09 DIAGNOSIS — M25561 Pain in right knee: Secondary | ICD-10-CM | POA: Diagnosis not present

## 2019-06-09 DIAGNOSIS — M25562 Pain in left knee: Secondary | ICD-10-CM | POA: Diagnosis not present

## 2019-06-09 NOTE — Therapy (Signed)
[facility] [address] [place], [address] Phone: [phone]   Fax:  [phone]  Physical Therapy Treatment  Patient Details  Name: Sheena Martinez MRN: [id number] Date of Birth: 1961/01/10 Referring Provider (PT): Wyatt Portela, PA-C   Encounter Date: 06/09/2019  PT End of Session - 06/09/19 1324    Visit Number  9    Number of Visits  12    Date for PT Re-Evaluation  06/30/19    Authorization Type  Medicaid reporting from 05/04/2019    Authorization Time Period  certification period: 05/04/2019 - 06/15/2019    Authorization - Visit Number  9    Authorization - Number of Visits  13    PT Start Time  1300    PT Stop Time  1345    PT Time Calculation (min)  45 min    Activity Tolerance  Patient tolerated treatment well    Behavior During Therapy  [facility] for tasks assessed/performed       Past Medical History:  Diagnosis Date  . Anxiety   . Arthritis   . 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 GERD (gastroesophageal reflux disease)    OCC  . Goiter   . Hematuria 10/21/2018  . Hyperthyroidism   . Personal history of radiation therapy   . Sjogren's syndrome ([place])   . Uses continuous positive airway pressure (CPAP) ventilation at home 04/30/2019    Past Surgical History:  Procedure Laterality Date  . BREAST BIOPSY Left 05/12/2017   [place] bx/ INVASIVE MAMMARY CARCINOMA  . BREAST LUMPECTOMY Left 05/2017   invasive mammary carcinoma, neg margins  . BREAST LUMPECTOMY WITH SENTINEL LYMPH NODE BIOPSY Left 06/10/2017   Procedure: BREAST LUMPECTOMY WITH SENTINEL LYMPH NODE BX AND NEEDLE LOCALIZATION;  Surgeon: Robert Bellow, MD;  Location: [facility] ORS;  Service: General;  Laterality: Left;  . BREAST MAMMOSITE Left 06/24/2017   Procedure: MAMMOSITE BREAST;  Surgeon: Robert Bellow, MD;  Location: [facility] ORS;  Service: General;  Laterality: Left;  . CESAREAN SECTION  1995  . 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 COSMETIC SURGERY    . CYST EXCISION  2018   pilar cyst/ Dr Will Bonnet  . EYE SURGERY    . PORTACATH PLACEMENT Right 07/08/2017   Procedure: INSERTION PORT-A-CATH;  Surgeon: Robert Bellow, MD;  Location: [facility] ORS;  Service: General;  Laterality: Right;  . TONSILLECTOMY      There were no vitals filed for this visit.  Subjective Assessment - 06/09/19 1303    Subjective  Patient states her ankle bothered her today due to the balance exercises last sesson but she also walking and standing a lot yestersay (2 hours roughly). Patient will continue    Pertinent History  Patient is a 58 y.o. female who presents to outpatient physical therapy with a referral for medical diagnosis of bilateral primary osteoarthritis of knee and other specified arthritis, right ankle and foot. This patient's chief complaints consist of constant bilateral knee pain and occasional R ankle pain that has been gradually increasing in severity since insidious onset about 4 years ago, leading to the following functional deficits: difficulty prolonged sitting, standing, walking, quality of life, lifting, etc.. 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 Past surgeries include: breast lumpectomy 2018, breast mammosite 2018, corneal transplant 2019 L eye. (See more details in chart.)    Limitations  Sitting;Standing;Walking    How long can you sit comfortably?  60 minutes    How long can you stand comfortably?  45 minutes    How long can you walk comfortably?  30 minutes    Diagnostic tests  X-ray Impression: Advanced medial  and patellofemoral osteoarthritis at R knee    Patient Stated Goals  reduce bilateral knee pain and able to go to grocery store shopping for at least 2 hours    Pain Onset  More than a month ago    Pain Onset  More than a month ago       Therapeutic exercise:to centralize symptoms and improve ROM, strength, muscular endurance, and activity tolerance required for successful completion of functional activities. -Treadmill walk1.16mph for improved extremity mobility, muscular endurance, and activity tolerance; and to induce the analgesic effect of aerobic exercise, stimulate improved joint nutrition, and prepare body structures and systems for following interventions. x85minutes.  - LE strengthening  - Seated gluteal activation 20 reps   - Seated BLE knee extension with 4lb ankle weight x20reps on each - Standing halfsquat2x20 reps. Cuing provided to improveglutealactivation and controled knee flexion.   - StandingGluteal activation 20 reps Cuing provided for form, muscle activation patterns, education on appropriate pain. . - Body scanning in seating for pain control by promote muscle relaxation and increase activity tolerance by reduced pain sensitivity.  Neuromuscular Re-education: to improve, balance, postural strength, muscle activation patterns, and stabilization strength required for functional activities: - Balance training   - on airex tandem standing with 1.5inches between foot EO/EC 30s each  - on airex feet together 30s   - Tandem walking with one hand over the stationary bar 74feet x 6 rounds forward/backward. Mild postural sway when R Foot behind during Tandem standing.   HOME EXERCISE PROGRAM Access Code: [email or web address] URL: [email or web address] Date: 05/25/2019  Prepared by: Rosita Kea   Exercises Sidelying Hip Abduction - 2 sets - 10 reps - 1x daily - 7x weekly Beginner Bridge - 2 sets - 10 reps - 1x  daily - 7x weekly Supine March - 2 sets - 10 reps - 1x daily - 7x weekly Supine Knee Extension Strengthening - 2 sets - 10 reps - 1x daily - 7x weekly   Clinical impression: Patient tolerated treatment well and continues to make progress towards goals. Patient demonstrated improved activities tolerances as well as form and techniques with weighted exercises. Patient also presented good self management of pain but still requires moderate cuing to improve further pain management (graded walking/standing plans). Will continue focus on abdominal muscle activation, strengthening, and pain controlPatient would benefit from continued physical therapy to address remaining impairments and functional limitations to work towards stated goals and return to PLOF or maximal functional independence.       PT Education - 06/09/19 1324    Education Details  Exercise purpose/form. Self management techniques.    Person(s) Educated  Patient    Methods  Explanation;Demonstration;Verbal cues;Tactile cues    Comprehension  Verbalized understanding;Returned demonstration;Verbal cues required;Tactile cues required       PT Short Term Goals - 05/18/19 1554      PT SHORT TERM GOAL #1   Title  Be independent with initial home exercise program for self-management  of symptoms.    Baseline  initial HEP provided at IE (05/04/2019);    Time  2    Period  Weeks    Status  Achieved    Target Date  05/18/19        PT Long Term Goals - 05/04/19 2009      PT LONG TERM GOAL #1   Title  Be independent with a long-term home exercise program for self-management of symptoms.    Baseline  initial HEP provided at IE (05/04/2019);    Time  8    Period  Weeks    Status  New    Target Date  06/30/19      PT LONG TERM GOAL #2   Title  Demonstrate improved FOTO score by 10 units to demonstrate improvement in overall condition and self-reported functional ability.    Baseline  FOTO 51 MCD 9(05/04/2019);    Time  8     Period  Weeks    Status  New    Target Date  06/30/19      PT LONG TERM GOAL #3   Title  Reduce pain with functional activities to equal or less than 1/10 to allow patient to complete usual activities including ADLs, IADLs, and social engagement with less difficulty.    Baseline  4/10 currently(05/04/2019);    Time  8    Period  Weeks    Status  New    Target Date  06/30/19      PT LONG TERM GOAL #4   Title  Complete community, work and/or recreational activities without limitation due to current condition.    Baseline  Difficulties go to grocery shopping; long time sitting, walking, sleeping(05/04/2019);    Time  8    Period  Weeks    Status  New    Target Date  06/30/19      PT LONG TERM GOAL #5   Title  Demonstrated at least 1 grade improvement for MMT to improve functional mobility and strength.    Baseline  See objectives (05/04/2019);    Time  8    Period  Weeks    Status  New    Target Date  06/30/19            Plan - 06/09/19 1354    Clinical Impression Statement  Patient tolerated treatment well and continues to make progress towards goals. Patient demonstrated improved activities tolerances as well as form and techniques with weighted exercises. Patient also presented good self management of pain but still requires moderate cuing to improve further pain management (graded walking/standing plans). Will continue focus on abdominal muscle activation, strengthening, and pain controlPatient would benefit from continued physical therapy to address remaining impairments and functional limitations to work towards stated goals and return to PLOF or maximal functional independence.    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, anxiety, hyperthyroidism    Examination-Activity Limitations   Sleep;Sit;Stand;Caring for Others;Squat    Examination-Participation Restrictions  Community Activity;Shop;Other   sleeping on her bed   Stability/Clinical Decision Making  Evolving/Moderate complexity    Rehab Potential  Good    PT Frequency  2x / week    PT Duration  8 weeks    PT Treatment/Interventions  Joint Manipulations;ADLs/Self Care Home Management;Cryotherapy;Therapeutic activities;Functional mobility training;Therapeutic exercise;Balance training;Neuromuscular re-education;Patient/family education;Manual techniques;Passive range of motion;Dry needling;Other (  comment);Moist Heat;Electrical Stimulation;Gait training   joint mobilization grades I-V   PT Next Visit Plan  Muscle LE strength, endurance, as well as pain control .    PT Home Exercise Plan  Medbridge: [email or web address]    Consulted and Agree with Plan of Care  Patient       Patient will benefit from skilled therapeutic intervention in order to improve the following deficits and impairments:  Decreased balance, Decreased endurance, Abnormal gait, Increased muscle spasms, Decreased activity tolerance, Decreased strength, Pain, Improper body mechanics, Difficulty walking, Obesity, Impaired perceived functional ability, Impaired flexibility  Visit Diagnosis: Chronic pain of right knee  Chronic pain of left knee  Pain in right ankle and joints of right foot  Muscle weakness (generalized)     Problem List Patient Active Problem List   Diagnosis Date Noted  . Osteopenia 03/18/2019  . Facial flushing 10/21/2018  . Tinnitus of both ears 10/21/2018  . Drug-induced neutropenia ([place]) 10/20/2018  . 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 Thrombocytosis ([place]) 10/20/2018  . 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 Irregular menstrual cycle 03/02/2018  . Screening for cervical cancer 03/02/2018  . Long-term current use of tamoxifen 02/17/2018  . History of cornea transplant  12/09/2017  . Anxiety 10/03/2017  . Bilateral primary osteoarthritis of knee 10/03/2017  . 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 Malignant neoplasm of upper-inner quadrant of left breast in female, estrogen receptor positive ([place]) 05/22/2017  . Hirsutism 11/24/2015  . Hyperlipidemia 02/01/2014  . Impaired glucose tolerance 02/01/2014  . Vitamin D deficiency 02/01/2014  . Family history of diabetes mellitus 01/31/2014  . Morbid obesity with BMI of 40.0-44.9, adult ([place]) 01/31/2014  . Sjogren's syndrome ([place]) 01/17/2014    Sherrilyn Rist, SPT 06/09/19, 1:58 PM  Everlean Alstrom. Graylon Good, PT, DPT 06/09/19, 2:07 PM  [facility] [address] [place], [address] Phone: [phone]   Fax:  [phone]  Name: Sheena Martinez MRN: [id number] Date of Birth: Mar 02, 1961

## 2019-06-13 DIAGNOSIS — G4733 Obstructive sleep apnea (adult) (pediatric): Secondary | ICD-10-CM | POA: Diagnosis not present

## 2019-06-14 ENCOUNTER — Ambulatory Visit: Payer: Medicaid Other | Attending: Family Medicine | Admitting: Physical Therapy

## 2019-06-14 ENCOUNTER — Other Ambulatory Visit: Payer: Self-pay

## 2019-06-14 ENCOUNTER — Encounter: Payer: Self-pay | Admitting: Physical Therapy

## 2019-06-14 DIAGNOSIS — M25561 Pain in right knee: Secondary | ICD-10-CM | POA: Insufficient documentation

## 2019-06-14 DIAGNOSIS — M25562 Pain in left knee: Secondary | ICD-10-CM | POA: Insufficient documentation

## 2019-06-14 DIAGNOSIS — M6281 Muscle weakness (generalized): Secondary | ICD-10-CM | POA: Diagnosis not present

## 2019-06-14 DIAGNOSIS — G8929 Other chronic pain: Secondary | ICD-10-CM | POA: Diagnosis not present

## 2019-06-14 DIAGNOSIS — M25571 Pain in right ankle and joints of right foot: Secondary | ICD-10-CM | POA: Diagnosis not present

## 2019-06-14 NOTE — Therapy (Signed)
[facility] [address], [place], [address] Phone: [phone]   Fax:  [phone]  Physical Therapy Treatment/Physical Therapy Progress Note   Dates of reporting period 05/04/2019 to 06/14/2019   Patient Details  Name: Sheena Martinez MRN: [id number] Date of Birth: [birth year] Referring Provider (PT): Wyatt Portela, PA-C   Encounter Date: 06/14/2019  PT End of Session - 06/14/19 1119    Visit Number  10    Number of Visits  12    Date for PT Re-Evaluation  06/30/19    Authorization Type  Medicaid reporting from 05/04/2019    Authorization Time Period  certification period: 05/04/2019 - 06/15/2019    Authorization - Visit Number  10    Authorization - Number of Visits  13    PT Start Time  1114    PT Stop Time  1200    PT Time Calculation (min)  46 min    Activity Tolerance  Patient tolerated treatment well    Behavior During Therapy  [facility] for tasks assessed/performed       Past Medical History:  Diagnosis Date  . Anxiety   . Arthritis   . 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 GERD (gastroesophageal reflux disease)    OCC  . Goiter   . Hematuria 10/21/2018  . Hyperthyroidism   . Personal history of radiation therapy   . Sjogren's syndrome ([place])   . Uses continuous positive airway pressure (CPAP) ventilation at home 04/30/2019    Past Surgical History:  Procedure Laterality Date  . BREAST BIOPSY Left 05/12/2017   [place] bx/ INVASIVE MAMMARY CARCINOMA  . BREAST LUMPECTOMY Left 05/2017   invasive mammary carcinoma, neg margins  . BREAST LUMPECTOMY WITH SENTINEL LYMPH NODE BIOPSY Left 06/10/2017   Procedure: BREAST LUMPECTOMY WITH SENTINEL LYMPH NODE BX AND NEEDLE LOCALIZATION;  Surgeon: Robert Bellow, MD;  Location:  [facility] ORS;  Service: General;  Laterality: Left;  . BREAST MAMMOSITE Left 06/24/2017   Procedure: MAMMOSITE BREAST;  Surgeon: Robert Bellow, MD;  Location: [facility] ORS;  Service: General;  Laterality: Left;  . CESAREAN SECTION  1995  . 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 COSMETIC SURGERY    . CYST EXCISION  2018   pilar cyst/ Dr Will Bonnet  . EYE SURGERY    . PORTACATH PLACEMENT Right 07/08/2017   Procedure: INSERTION PORT-A-CATH;  Surgeon: Robert Bellow, MD;  Location: [facility] ORS;  Service: General;  Laterality: Right;  . TONSILLECTOMY      There were no vitals filed for this visit.  Subjective Assessment - 06/14/19 1119    Subjective  Patient reported reduced bilateral knee pain to 1/10 and has been able to participate walking/ recreational activities with families. Patient also reported improved self-care strategies for pain control at home such as increased resting breaks. Patient stated that physical therapy helped her a lot in strength, activity tolerance as well as pain reduction. Patient reports she still has difficulties with prolonged standing and walking. Patient has been doing body scanning at the middle of the day and stated she enjoyed it very much.    Pertinent History  Patient is a 58 y.o. female who presents to outpatient physical therapy with a referral for medical diagnosis of bilateral primary osteoarthritis  of knee and other specified arthritis, right ankle and foot. This patient's chief complaints consist of constant bilateral knee pain and occasional R ankle pain that has been gradually increasing in severity since insidious onset about 4 years ago, leading to the following functional deficits: difficulty prolonged sitting, standing, walking, quality of life, lifting, etc.. 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 Past surgeries include: breast lumpectomy 2018, breast mammosite 2018, corneal transplant 2019 L eye. (See more details in chart.)    Limitations  Sitting;Standing;Walking    How long can you sit comfortably?  60 minutes    How long can you stand comfortably?  45 minutes    How long can you walk comfortably?  30 minutes    Diagnostic tests  X-ray Impression: Advanced medial and patellofemoral osteoarthritis at R knee    Patient Stated Goals  reduce bilateral knee pain and able to go to grocery store shopping for at least 2 hours    Currently in Pain?  Yes    Pain Score  1     Pain Location  Knee    Pain Orientation  Right;Left    Pain Onset  More than a month ago    Pain Onset  More than a month ago         [facility] PT Assessment - 06/15/19 0001      Assessment   Medical Diagnosis  bilateral knee arthritis and right ankle arthritis    Referring Provider (PT)  Wyatt Portela, PA-C    Onset Date/Surgical Date  --   at least 4 years ago   Hand Dominance  Right      Precautions   Precautions  None      Restrictions   Weight Bearing Restrictions  No      Balance Screen   Has the patient fallen in the past 6 months  No      [place]  --   patient not concerned about home layout     Prior Function   Level of Independence  Independent    Vocation Requirements  ; used to provide childcare      Cognition   Overall Cognitive Status  Within Functional Limits for tasks assessed      Observation/Other Assessments   Observations  see note from 06/14/2019 for latest objective data    Focus on Therapeutic Outcomes (FOTO)   FOTO = 58       Objective:  Pain Score  1/10   Strength R/L 4+/4+ Hip flexion 4+/4+ Hip extension  4+/4+ Hip abduction 4+/4+ Hip adduction 5/5 Knee flexion 5/5 Ankle Inversion 5/5 Ankle Eversion *indicates pain  Therapeutic exercise: to centralize symptoms and  improve ROM, strength, muscular endurance, and activity tolerance required for successful completion of functional activities.  - Treadmill walk 1.70mh for improved extremity mobility, muscular endurance, and activity tolerance; and to induce the analgesic effect of aerobic exercise, stimulate improved joint nutrition, and prepare body structures and systems for following interventions. x 7 minutes. - manual muscle testing to assess progress (see above)  - LE strengthening             - Seated gluteal activation 20 reps              - Seated BLE knee extension with 4lb ankle weight x 20  reps on each              - Standing half squat 2x20 reps. Cuing provided to improve gluteal activation and controled knee flexion.                - Standing Gluteal activation 20 reps Cuing provided for form, muscle activation patterns, education on appropriate pain. . - Body scanning in seating for pain control by promote muscle relaxation and increase activity tolerance by reduced pain sensitivity.  - Patient was educated on diagnosis, anatomy and pathology involved, prognosis, role of PT, and was given an HEP, demonstrating exercise with proper form following verbal and tactile cues, and was given a paper hand out to continue exercise at home. Pt was educated on and agreed to plan of care.   HOME EXERCISE PROGRAM Access Code: [email or web address]       URL: [email or web address]    Date: 05/25/2019  Prepared by: Rosita Kea    Exercises Sidelying Hip Abduction - 2 sets - 10 reps - 1x daily - 7x weekly Beginner Bridge - 2 sets - 10 reps - 1x daily - 7x weekly Supine March - 2 sets - 10 reps - 1x daily - 7x weekly Supine Knee Extension Strengthening - 2 sets - 10 reps - 1x daily - 7x weekly      Clinical impression:  Patient has attended 10 skilled physical therapy treatment sessions this episode of care and overall presents with good progress towards stated goals. Subjectively, patient reports feeling  increased activity tolerance, self-care pain control and pain reduction but she is still bothered by bilateral knee pains with prolonged walking (>1hour). Objectively, patient demonstrates good progression with increased activity tolerance (housework), walking distance/speed/frequency, strength (MMT), outcome measure (FOTO) and self-care for pain control (pain reduction and frequency of resting breaks) but stated continued significant high irritability with B knee pain with prolonged sitting/walking/standing activities. Patient continues to present with significant strength, pain, and activity tolerance impairments that are limiting ability to complete her usual ADLs, IADLs, community participation, manage her home, bend, lift, and ambulate community distances. Patient will benefit from continued skilled physical therapy intervention to address current body structure impairments and activity limitations to improve function and work towards goals set in current POC in order to return to prior level of function or maximal functional improvement.    PT Education - 06/14/19 1119    Education Details  Exercise purpose/form. Self management techniques.    Person(s) Educated  Patient    Methods  Explanation;Demonstration;Tactile cues;Verbal cues    Comprehension  Verbalized understanding;Returned demonstration;Verbal cues required;Tactile cues required       PT Short Term Goals - 05/18/19 1554      PT SHORT TERM GOAL #1   Title  Be independent with initial home exercise program for self-management of symptoms.    Baseline  initial HEP provided at IE (05/04/2019);    Time  2    Period  Weeks    Status  Achieved    Target Date  05/18/19        PT Long Term Goals - 06/14/19 1121      PT LONG TERM GOAL #1   Title  Be independent with a long-term home exercise program for self-management of symptoms.    Baseline  initial HEP provided at IE (05/04/2019); Continue updating (06/14/2019)    Time  8     Period  Weeks    Status  Partially Met    Target Date  06/30/19      PT LONG TERM GOAL #2   Title  Demonstrate improved FOTO score by 10 units to demonstrate improvement in overall condition and self-reported functional ability.    Baseline  FOTO 51 MCD 9(05/04/2019); 58 with difficulites walking a mile  (06/14/2019);    Time  8    Period  Weeks    Status  Partially Met    Target Date  06/30/19      PT LONG TERM GOAL #3   Title  Reduce pain with functional activities to equal or less than 1/10 to allow patient to complete usual activities including ADLs, IADLs, and social engagement with less difficulty.    Baseline  4/10 currently(05/04/2019); 1/10 (06/14/2019)    Time  8    Period  Weeks    Status  Achieved    Target Date  06/30/19      PT LONG TERM GOAL #4   Title  Complete community, work and/or recreational activities without limitation due to current condition.    Baseline  Difficulties go to grocery shopping; long time sitting, walking, sleeping(05/04/2019); Difficulies with prolonged standing/walking 1 miles. but patient was able to walking faster and longer than before. (06/14/2019)    Time  8    Period  Weeks    Status  Partially Met    Target Date  06/30/19      PT LONG TERM GOAL #5   Title  Demonstrated at least 1 grade improvement for MMT to improve functional mobility and strength.    Baseline  See objectives (05/04/2019, 05/14/2019);    Time  8    Period  Weeks    Status  Partially Met    Target Date  06/30/19            Plan - 06/14/19 1120    Clinical Impression Statement  Patient has attended 10 skilled physical therapy treatment sessions this episode of care and overall presents with good progress towards stated goals. Subjectively, patient reports feeling increased activity tolerance, self-care pain control and pain reduction but she is still bothered by bilateral knee pains with prolonged walking (>1hour). Objectively, patient demonstrates good  progression with increased activity tolerance (housework), walking distance/speed/frequency, strength (MMT), outcome measure (FOTO) and self-care for pain control (pain reduction and frequency of resting breaks) but stated continued significant high irritability with B knee pain with prolonged sitting/walking/standing activities. Patient continues to present with significant strength, pain, and activity tolerance impairments that are limiting ability to complete her usual ADLs, IADLs, community participation, manage her home, bend, lift, and ambulate community distances. Patient will benefit from continued skilled physical therapy intervention to address current body structure impairments and activity limitations to improve function and work towards goals set in current POC in order to return to prior level of function or maximal functional improvement.    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, anxiety, hyperthyroidism    Examination-Activity Limitations  Sleep;Sit;Stand;Caring for Others;Squat    Examination-Participation Restrictions  Community Activity;Shop;Other   sleeping on her bed   Stability/Clinical Decision Making  Evolving/Moderate complexity    Rehab Potential  Good    PT Frequency  2x / week    PT Duration  8 weeks    PT Treatment/Interventions  Joint Manipulations;ADLs/Self Care Home Management;Cryotherapy;Therapeutic activities;Functional mobility training;Therapeutic exercise;Balance training;Neuromuscular re-education;Patient/family education;Manual techniques;Passive range of motion;Dry  needling;Other (comment);Moist Heat;Electrical Stimulation;Gait training   joint mobilization grades I-V   PT Next Visit Plan  Muscle LE strength, endurance, as well as pain control .    PT Home Exercise  Plan  Medbridge: [email or web address]    Consulted and Agree with Plan of Care  Patient       Patient will benefit from skilled therapeutic intervention in order to improve the following deficits and impairments:  Decreased balance, Decreased endurance, Abnormal gait, Increased muscle spasms, Decreased activity tolerance, Decreased strength, Pain, Improper body mechanics, Difficulty walking, Obesity, Impaired perceived functional ability, Impaired flexibility  Visit Diagnosis: Chronic pain of right knee  Chronic pain of left knee  Pain in right ankle and joints of right foot  Muscle weakness (generalized)     Problem List Patient Active Problem List   Diagnosis Date Noted  . Osteopenia 03/18/2019  . Facial flushing 10/21/2018  . Tinnitus of both ears 10/21/2018  . Drug-induced neutropenia ([place]) 10/20/2018  . 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 Thrombocytosis ([place]) 10/20/2018  . 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 Irregular menstrual cycle 03/02/2018  . Screening for cervical cancer 03/02/2018  . Long-term current use of tamoxifen 02/17/2018  . 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 Anxiety 10/03/2017  . Bilateral primary osteoarthritis of knee 10/03/2017  . 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 Malignant neoplasm of upper-inner quadrant of left breast in female, estrogen receptor positive ([place]) 05/22/2017  . Hirsutism 11/24/2015  . Hyperlipidemia 02/01/2014  . Impaired glucose tolerance 02/01/2014  . Vitamin D deficiency 02/01/2014  . Family history of diabetes mellitus 01/31/2014  . Morbid obesity with BMI of 40.0-44.9, adult ([place]) 01/31/2014  . Sjogren's syndrome ([place]) 01/17/2014    Sherrilyn Rist, SPT 06/15/19, 9:30 AM  Everlean Alstrom. Graylon Good, PT, DPT 06/15/19, 9:30 AM  [facility] [address] [place], [address] Phone: [phone]   Fax:   [phone]  Name: Sheena Martinez MRN: [id number] Date of Birth: 1961-01-22

## 2019-06-15 DIAGNOSIS — E0520 Thyrotoxicosis with toxic multinodular goiter without thyrotoxic crisis or storm: Secondary | ICD-10-CM | POA: Diagnosis not present

## 2019-06-16 ENCOUNTER — Ambulatory Visit: Payer: Medicaid Other | Admitting: Physical Therapy

## 2019-06-16 ENCOUNTER — Inpatient Hospital Stay: Payer: Medicaid Other

## 2019-06-21 ENCOUNTER — Encounter: Payer: Self-pay | Admitting: Physical Therapy

## 2019-06-21 ENCOUNTER — Other Ambulatory Visit: Payer: Self-pay

## 2019-06-21 ENCOUNTER — Ambulatory Visit: Payer: Medicaid Other | Admitting: Physical Therapy

## 2019-06-21 DIAGNOSIS — G8929 Other chronic pain: Secondary | ICD-10-CM | POA: Diagnosis not present

## 2019-06-21 DIAGNOSIS — M25561 Pain in right knee: Secondary | ICD-10-CM

## 2019-06-21 DIAGNOSIS — M25571 Pain in right ankle and joints of right foot: Secondary | ICD-10-CM

## 2019-06-21 DIAGNOSIS — M6281 Muscle weakness (generalized): Secondary | ICD-10-CM

## 2019-06-21 DIAGNOSIS — M25562 Pain in left knee: Secondary | ICD-10-CM

## 2019-06-21 NOTE — Therapy (Signed)
[facility] [address] [place], [address] Phone: [phone]   Fax:  [phone]  Physical Therapy Treatment / Discharge Note Reporting period: 05/04/2019 - 06/21/2019  Patient Details  Name: Sheena Martinez MRN: [id number] Date of Birth: 1961/05/18 Referring Provider (PT): Wyatt Portela, PA-C   Encounter Date: 06/21/2019  PT End of Session - 06/21/19 1040    Visit Number  11    Number of Visits  12    Date for PT Re-Evaluation  06/30/19    Authorization Type  Medicaid reporting from 05/04/2019    Authorization Time Period  certification period: 05/04/2019 - 06/15/2019    Authorization - Visit Number  11    Authorization - Number of Visits  13    PT Start Time  1031    PT Stop Time  1115    PT Time Calculation (min)  44 min    Activity Tolerance  Patient tolerated treatment well    Behavior During Therapy  [facility] for tasks assessed/performed       Past Medical History:  Diagnosis Date  . Anxiety   . Arthritis   . 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 Corneal perforation of left eye 10/09/2017   Overview:  Added automatically from request for surgery [id number]  . GERD (gastroesophageal reflux disease)    OCC  . Goiter   . Hematuria 10/21/2018  . Hyperthyroidism   . Personal history of radiation therapy   . Sjogren's syndrome ([place])   . Uses continuous positive airway pressure (CPAP) ventilation at home 04/30/2019    Past Surgical History:  Procedure Laterality Date  . BREAST BIOPSY Left 05/12/2017   [place] bx/ INVASIVE MAMMARY CARCINOMA  . BREAST LUMPECTOMY Left 05/2017   invasive mammary carcinoma, neg margins  . BREAST LUMPECTOMY WITH SENTINEL LYMPH NODE BIOPSY Left 06/10/2017   Procedure: BREAST LUMPECTOMY WITH SENTINEL LYMPH NODE BX AND NEEDLE LOCALIZATION;  Surgeon: Robert Bellow, MD;  Location: [facility] ORS;  Service: General;   Laterality: Left;  . BREAST MAMMOSITE Left 06/24/2017   Procedure: MAMMOSITE BREAST;  Surgeon: Robert Bellow, MD;  Location: [facility] ORS;  Service: General;  Laterality: Left;  . CESAREAN SECTION  1995  . 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 COSMETIC SURGERY    . CYST EXCISION  2018   pilar cyst/ Dr Will Bonnet  . EYE SURGERY    . PORTACATH PLACEMENT Right 07/08/2017   Procedure: INSERTION PORT-A-CATH;  Surgeon: Robert Bellow, MD;  Location: [facility] ORS;  Service: General;  Laterality: Right;  . TONSILLECTOMY      There were no vitals filed for this visit.  Subjective Assessment - 06/21/19 1035    Subjective  Patient reports she is 2/10 now but she has been feeling increased pain and pelvic problems over the weekned due to increased prolonged sitting and standing (2hours each day) Patient reports she took pain medication for pain control and she is able to recover to baseline after a night sleep. Patient reports she is satisfied with her PT care for her knees and ankles and agrees that she would like to discharge from this episode of care in order to pursue treatment for pelvic floor with pelvic floor PT.    Pertinent History  Patient is a 58 y.o. female who presents to outpatient physical therapy with a referral for medical diagnosis of bilateral primary  osteoarthritis of knee and other specified arthritis, right ankle and foot. This patient's chief complaints consist of constant bilateral knee pain and occasional R ankle pain that has been gradually increasing in severity since insidious onset about 4 years ago, leading to the following functional deficits: difficulty prolonged sitting, standing, walking, quality of life, lifting, etc.. 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 Past surgeries include: breast lumpectomy 2018, breast mammosite 2018, corneal transplant 2019 L eye. (See more details in chart.)    Limitations  Sitting;Standing;Walking    How long can you sit comfortably?  60 minutes    How long can you stand comfortably?  45 minutes    How long can you walk comfortably?  30 minutes    Diagnostic tests  X-ray Impression: Advanced medial and patellofemoral osteoarthritis at R knee    Patient Stated Goals  reduce bilateral knee pain and able to go to grocery store shopping for at least 2 hours    Currently in Pain?  Yes    Pain Score  2     Pain Location  Back    Pain Orientation  Right;Left    Pain Onset  More than a month ago    Pain Onset  More than a month ago      OBJECTIVE:  See latest measurements taken last session (06/14/2019).   Therapeutic exercise:to centralize symptoms and improve ROM, strength, muscular endurance, and activity tolerance required for successful completion of functional activities. -Treadmill walk1.56mh for improved extremity mobility, muscular endurance, and activity tolerance; and to induce the analgesic effect of aerobic exercise, stimulate improved joint nutrition, and prepare body structures and systems for following interventions. x625mutes.  - LE strengthening             - Seated/stadning gluteal activation 1x10 reps each position.  - Standing halfsquat2x20 reps. Cuing provided to improveglutealactivation and controled knee flexion. - Body scanning in seating for pain control by promote muscle relaxation and increase activity tolerance by reduced pain sensitivity.Feedback of self-report body scanning process provided at the end of the session. -Education provided on scheduling and tools(chiars) to improve frequency of breaks between prolonged walking/standing daily activities (grocery shopping, playing with grandchild, and walking). Education also focused on self-care for pain science and relationship of  pain and mental stress.    Therapeutic activities: for functional strengthening and improved functional activity tolerance. - Step up stairs 2x10 reps on each side. With one hand support on railing. Cuing and video feedback provided to improve pelvic stability. Patient report R ankle pain during step up with R LE.     HOME EXERCISE PROGRAM Access Code: [email or web address] URL: [email or web address] Date: 05/25/2019  Prepared by: SaRosita Kea Exercises Sidelying Hip Abduction - 2 sets - 10 reps - 1x daily - 7x weekly Beginner Bridge - 2 sets - 10 reps - 1x daily - 7x weekly Supine March - 2 sets - 10 reps - 1x daily - 7x weekly Supine Knee Extension Strengthening - 2 sets - 10 reps - 1x daily - 7x weekly   Clinical impression: Patient has attended 11 physical therapy sessions this episode of care and has made good progress towards goals. She has met or partially met most goals except that she continues having flare up of whole body pain with prolonged walking/standing daily activities and high stress level. Patient  has provided education on scheduling to allow breaks and graded to return her PLOF activity intensity. Patient stated her major concern now is pelvic floor problem and would like to return to pelvic floor PT for treatment. Patient is now being discharged from physical therapy for her knees and ankle at this time. She has been provided with a long term HEP for long term improvement and prophylaxis against return of symptoms.      PT Education - 06/21/19 1039    Education Details  Exercise purpose/form. Self management techniques.    Person(s) Educated  Patient    Methods  Explanation;Demonstration;Tactile cues;Verbal cues    Comprehension  Verbalized understanding;Returned demonstration;Verbal cues required;Tactile cues required       PT Short Term Goals - 05/18/19 1554      PT SHORT TERM GOAL #1   Title  Be independent with initial home exercise  program for self-management of symptoms.    Baseline  initial HEP provided at IE (05/04/2019);    Time  2    Period  Weeks    Status  Achieved    Target Date  05/18/19        PT Long Term Goals - 06/21/19 1129      PT LONG TERM GOAL #1   Title  Be independent with a long-term home exercise program for self-management of symptoms.    Baseline  initial HEP provided at IE (05/04/2019); Continue updating (06/14/2019)    Time  8    Period  Weeks    Status  Partially Met    Target Date  06/30/19      PT LONG TERM GOAL #2   Title  Demonstrate improved FOTO score by 10 units to demonstrate improvement in overall condition and self-reported functional ability.    Baseline  FOTO 51 MCD 9(05/04/2019); 58 with difficulites walking a mile (06/14/2019); Able to walk/standing for 2 hours, really painful all over the body at night but able to recover to her baseline at 2/10 pain the other day in the morning(06/21/2019)    Time  8    Period  Weeks    Status  Partially Met    Target Date  06/30/19      PT LONG TERM GOAL #3   Title  Reduce pain with functional activities to equal or less than 1/10 to allow patient to complete usual activities including ADLs, IADLs, and social engagement with less difficulty.    Baseline  4/10 currently(05/04/2019); 1/10 (06/14/2019);    Time  8    Period  Weeks    Status  Achieved    Target Date  06/30/19      PT LONG TERM GOAL #4   Title  Complete community, work and/or recreational activities without limitation due to current condition.    Baseline  Difficulties go to grocery shopping; long time sitting, walking, sleeping(05/04/2019); Difficulies with prolonged standing/walking 1 miles. but patient was able to walking faster and longer than before. (06/14/2019)    Time  8    Period  Weeks    Status  Partially Met    Target Date  06/30/19      PT LONG TERM GOAL #5   Title  Demonstrated at least 1 grade improvement for MMT to improve functional mobility and  strength.    Baseline  See objectives (05/04/2019, 05/14/2019);    Time  8    Period  Weeks    Status  Partially Met    Target Date  06/30/19            Plan - 06/21/19 1042    Clinical Impression Statement  Patient has attended 11 physical therapy sessions this episode of care and has made good progress towards goals. She has met or partially met most goals except that she continues having flare up of whole body pain with prolonged walking/standing daily activities and high stress level. Patient has provided education on scheduling to allow breaks and graded to return her PLOF activity intensity. Patient stated her major concern now is pelvic floor problem and would like to return to pelvic floor PT for treatment. Patient is now being discharged from physical therapy for her knees and ankle at this time. She has been provided with a long term HEP for long term improvement and prophylaxis against return of symptoms.    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, anxiety, hyperthyroidism    Examination-Activity Limitations  Sleep;Sit;Stand;Caring for Others;Squat    Examination-Participation Restrictions  Community Activity;Shop;Other   sleeping on her bed   Stability/Clinical Decision Making  Evolving/Moderate complexity    Rehab Potential  Good    PT Frequency  2x / week    PT Duration  8 weeks    PT Treatment/Interventions  Joint Manipulations;ADLs/Self Care Home Management;Cryotherapy;Therapeutic activities;Functional mobility training;Therapeutic exercise;Balance training;Neuromuscular re-education;Patient/family education;Manual techniques;Passive range of motion;Dry needling;Other (comment);Moist Heat;Electrical Stimulation;Gait training   joint mobilization grades I-V   PT Next Visit Plan   Patient is now discharged from physcial therapy for knees and R ankle.    PT Home Exercise Plan  Medbridge: [email or web address]    Consulted and Agree with Plan of Care  Patient       Patient will benefit from skilled therapeutic intervention in order to improve the following deficits and impairments:  Decreased balance, Decreased endurance, Abnormal gait, Increased muscle spasms, Decreased activity tolerance, Decreased strength, Pain, Improper body mechanics, Difficulty walking, Obesity, Impaired perceived functional ability, Impaired flexibility  Visit Diagnosis: Chronic pain of right knee  Chronic pain of left knee  Pain in right ankle and joints of right foot  Muscle weakness (generalized)     Problem List Patient Active Problem List   Diagnosis Date Noted  . Osteopenia 03/18/2019  . Facial flushing 10/21/2018  . Tinnitus of both ears 10/21/2018  . Drug-induced neutropenia ([place]) 10/20/2018  . 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 Thrombocytosis ([place]) 10/20/2018  . 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 Irregular menstrual cycle 03/02/2018  . Screening for cervical cancer 03/02/2018  . Long-term current use of tamoxifen 02/17/2018  . 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 Anxiety 10/03/2017  . Bilateral primary osteoarthritis of knee 10/03/2017  . 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 Malignant neoplasm of upper-inner quadrant of left breast in female, estrogen receptor positive ([place]) 05/22/2017  . Hirsutism 11/24/2015  . Hyperlipidemia 02/01/2014  . Impaired glucose tolerance 02/01/2014  . Vitamin D deficiency 02/01/2014  . Family history of diabetes mellitus 01/31/2014  . Morbid obesity with BMI of 40.0-44.9, adult ([place]) 01/31/2014  . Sjogren's syndrome ([place]) 01/17/2014    Sherrilyn Rist, SPT 06/21/19, 2:47 PM  Everlean Alstrom. Graylon Good, PT, DPT 06/21/19, 2:47 PM  [facility] [address], [place], [address] Phone: [phone]   Fax:  [phone]  Name:  MARIJEAN MONTANYE MRN: [id number] Date of Birth: 1961-06-25

## 2019-06-22 DIAGNOSIS — E0520 Thyrotoxicosis with toxic multinodular goiter without thyrotoxic crisis or storm: Secondary | ICD-10-CM | POA: Diagnosis not present

## 2019-06-25 DIAGNOSIS — H0101B Ulcerative blepharitis left eye, upper and lower eyelids: Secondary | ICD-10-CM | POA: Diagnosis not present

## 2019-06-25 DIAGNOSIS — H0101A Ulcerative blepharitis right eye, upper and lower eyelids: Secondary | ICD-10-CM | POA: Diagnosis not present

## 2019-06-29 ENCOUNTER — Other Ambulatory Visit: Payer: Self-pay | Admitting: Internal Medicine

## 2019-06-29 DIAGNOSIS — E0520 Thyrotoxicosis with toxic multinodular goiter without thyrotoxic crisis or storm: Secondary | ICD-10-CM

## 2019-06-30 DIAGNOSIS — H0101B Ulcerative blepharitis left eye, upper and lower eyelids: Secondary | ICD-10-CM | POA: Diagnosis not present

## 2019-06-30 DIAGNOSIS — H40003 Preglaucoma, unspecified, bilateral: Secondary | ICD-10-CM | POA: Diagnosis not present

## 2019-06-30 DIAGNOSIS — H0101A Ulcerative blepharitis right eye, upper and lower eyelids: Secondary | ICD-10-CM | POA: Diagnosis not present

## 2019-07-13 DIAGNOSIS — G4733 Obstructive sleep apnea (adult) (pediatric): Secondary | ICD-10-CM | POA: Diagnosis not present

## 2019-07-20 DIAGNOSIS — F419 Anxiety disorder, unspecified: Secondary | ICD-10-CM | POA: Diagnosis not present

## 2019-07-20 DIAGNOSIS — G4700 Insomnia, unspecified: Secondary | ICD-10-CM | POA: Diagnosis not present

## 2019-07-20 DIAGNOSIS — F410 Panic disorder [episodic paroxysmal anxiety] without agoraphobia: Secondary | ICD-10-CM | POA: Diagnosis not present

## 2019-07-20 DIAGNOSIS — H9319 Tinnitus, unspecified ear: Secondary | ICD-10-CM | POA: Diagnosis not present

## 2019-07-21 ENCOUNTER — Other Ambulatory Visit: Payer: Self-pay

## 2019-07-21 ENCOUNTER — Inpatient Hospital Stay: Payer: Medicaid Other

## 2019-07-22 ENCOUNTER — Ambulatory Visit
Admission: RE | Admit: 2019-07-22 | Discharge: 2019-07-22 | Disposition: A | Discharge status: Home / Self Care | Payer: Medicaid Other | Source: Ambulatory Visit | Attending: Radiation Oncology | Admitting: Radiation Oncology

## 2019-07-22 ENCOUNTER — Other Ambulatory Visit: Payer: Self-pay

## 2019-07-22 DIAGNOSIS — Z79810 Long term (current) use of selective estrogen receptor modulators (SERMs): Secondary | ICD-10-CM | POA: Diagnosis not present

## 2019-07-22 DIAGNOSIS — Z923 Personal history of irradiation: Secondary | ICD-10-CM | POA: Insufficient documentation

## 2019-07-22 DIAGNOSIS — Z170 Estrogen receptor positive status [ER+]: Secondary | ICD-10-CM

## 2019-07-22 DIAGNOSIS — C50212 Malignant neoplasm of upper-inner quadrant of left female breast: Secondary | ICD-10-CM | POA: Insufficient documentation

## 2019-07-22 NOTE — Progress Notes (Signed)
Radiation Oncology Follow up Note  Name: Sheena Martinez   Date:   07/22/2019 MRN:  [id number] DOB: 22-Mar-1961    This 58 y.o. female presents to the clinic today for 2-year follow-up status post accelerated partial breast radiation to her left breast for stage I ER/PR positive invasive mammary carcinoma.  REFERRING PROVIDER: Hubbard Hartshorn, FNP  HPI: Patient is a 58 year old female now 2 years having completed accelerated partial breast radiation to her left breast for stage I ER/PR positive invasive mammary carcinoma seen today in routine follow-up she is doing well she specifically denies breast tenderness cough or bone pain.  She is currently on tamoxifen tolerating that well without side effect..  She had mammograms back in September which I have reviewed were BI-RADS 2 benign  COMPLICATIONS OF TREATMENT: none  FOLLOW UP COMPLIANCE: keeps appointments   PHYSICAL EXAM:  BP (P) 139/78 (BP Location: Left Arm, Patient Position: Sitting)   Pulse (P) 83   Temp (P) 99 F (37.2 C) (Tympanic)   Resp (P) 18   Wt (P) 221 lb 3.2 oz (100.3 kg)   LMP 01/05/2018   BMI (P) 36.81 kg/m  Lungs are clear to A&P cardiac examination essentially unremarkable with regular rate and rhythm. No dominant mass or nodularity is noted in either breast in 2 positions examined. Incision is well-healed. No axillary or supraclavicular adenopathy is appreciated. Cosmetic result is excellent.  Well-developed well-nourished patient in NAD. HEENT reveals PERLA, EOMI, discs not visualized.  Oral cavity is clear. No oral mucosal lesions are identified. Neck is clear without evidence of cervical or supraclavicular adenopathy. Lungs are clear to A&P. Cardiac examination is essentially unremarkable with regular rate and rhythm without murmur rub or thrill. Abdomen is benign with no organomegaly or masses noted. Motor sensory and DTR levels are equal and symmetric in the upper and lower extremities. Cranial nerves II through XII  are grossly intact. Proprioception is intact. No peripheral adenopathy or edema is identified. No motor or sensory levels are noted. Crude visual fields are within normal range.  RADIOLOGY RESULTS: Mammograms reviewed compatible with above-stated findings  PLAN: Present time patient is doing well 2 years out with no evidence of disease.  I am pleased with her overall progress.  Of asked to see her back in 1 year for follow-up.  She continues on tamoxifen without side effect.  She knows to call with any concerns at any time.  I would like to take this opportunity to thank you for allowing me to participate in the care of your patient.Noreene Filbert, MD

## 2019-07-28 ENCOUNTER — Inpatient Hospital Stay: Payer: Medicaid Other

## 2019-07-28 ENCOUNTER — Encounter: Payer: Self-pay | Admitting: *Deleted

## 2019-08-02 ENCOUNTER — Encounter: Payer: Self-pay | Admitting: Physical Therapy

## 2019-08-02 ENCOUNTER — Other Ambulatory Visit: Payer: Self-pay

## 2019-08-02 ENCOUNTER — Ambulatory Visit: Payer: Medicaid Other | Attending: Family Medicine | Admitting: Physical Therapy

## 2019-08-02 DIAGNOSIS — R293 Abnormal posture: Secondary | ICD-10-CM | POA: Insufficient documentation

## 2019-08-02 DIAGNOSIS — M62838 Other muscle spasm: Secondary | ICD-10-CM | POA: Diagnosis not present

## 2019-08-03 ENCOUNTER — Other Ambulatory Visit: Payer: Self-pay

## 2019-08-03 ENCOUNTER — Inpatient Hospital Stay: Payer: Medicaid Other | Attending: Oncology

## 2019-08-03 DIAGNOSIS — C50212 Malignant neoplasm of upper-inner quadrant of left female breast: Secondary | ICD-10-CM | POA: Diagnosis not present

## 2019-08-03 DIAGNOSIS — M8580 Other specified disorders of bone density and structure, unspecified site: Secondary | ICD-10-CM | POA: Insufficient documentation

## 2019-08-03 DIAGNOSIS — Z832 Family history of diseases of the blood and blood-forming organs and certain disorders involving the immune mechanism: Secondary | ICD-10-CM | POA: Diagnosis not present

## 2019-08-03 DIAGNOSIS — F411 Generalized anxiety disorder: Secondary | ICD-10-CM | POA: Diagnosis not present

## 2019-08-03 DIAGNOSIS — Z79899 Other long term (current) drug therapy: Secondary | ICD-10-CM | POA: Insufficient documentation

## 2019-08-03 DIAGNOSIS — Z888 Allergy status to other drugs, medicaments and biological substances status: Secondary | ICD-10-CM | POA: Insufficient documentation

## 2019-08-03 DIAGNOSIS — Z881 Allergy status to other antibiotic agents status: Secondary | ICD-10-CM | POA: Insufficient documentation

## 2019-08-03 DIAGNOSIS — Z801 Family history of malignant neoplasm of trachea, bronchus and lung: Secondary | ICD-10-CM | POA: Diagnosis not present

## 2019-08-03 DIAGNOSIS — Z170 Estrogen receptor positive status [ER+]: Secondary | ICD-10-CM

## 2019-08-03 DIAGNOSIS — Z8041 Family history of malignant neoplasm of ovary: Secondary | ICD-10-CM | POA: Insufficient documentation

## 2019-08-03 DIAGNOSIS — H16002 Unspecified corneal ulcer, left eye: Secondary | ICD-10-CM | POA: Insufficient documentation

## 2019-08-03 DIAGNOSIS — Z800 Family history of malignant neoplasm of digestive organs: Secondary | ICD-10-CM | POA: Diagnosis not present

## 2019-08-03 DIAGNOSIS — Z87891 Personal history of nicotine dependence: Secondary | ICD-10-CM | POA: Diagnosis not present

## 2019-08-03 DIAGNOSIS — E039 Hypothyroidism, unspecified: Secondary | ICD-10-CM | POA: Insufficient documentation

## 2019-08-03 DIAGNOSIS — Z808 Family history of malignant neoplasm of other organs or systems: Secondary | ICD-10-CM | POA: Diagnosis not present

## 2019-08-03 DIAGNOSIS — Z806 Family history of leukemia: Secondary | ICD-10-CM | POA: Diagnosis not present

## 2019-08-03 DIAGNOSIS — M3500 Sicca syndrome, unspecified: Secondary | ICD-10-CM | POA: Diagnosis not present

## 2019-08-03 DIAGNOSIS — Z560 Unemployment, unspecified: Secondary | ICD-10-CM | POA: Insufficient documentation

## 2019-08-03 DIAGNOSIS — Z833 Family history of diabetes mellitus: Secondary | ICD-10-CM | POA: Diagnosis not present

## 2019-08-03 DIAGNOSIS — Z8379 Family history of other diseases of the digestive system: Secondary | ICD-10-CM | POA: Diagnosis not present

## 2019-08-03 DIAGNOSIS — E0590 Thyrotoxicosis, unspecified without thyrotoxic crisis or storm: Secondary | ICD-10-CM | POA: Diagnosis not present

## 2019-08-03 DIAGNOSIS — R232 Flushing: Secondary | ICD-10-CM | POA: Diagnosis not present

## 2019-08-03 DIAGNOSIS — Z803 Family history of malignant neoplasm of breast: Secondary | ICD-10-CM | POA: Insufficient documentation

## 2019-08-03 DIAGNOSIS — M1990 Unspecified osteoarthritis, unspecified site: Secondary | ICD-10-CM | POA: Diagnosis not present

## 2019-08-03 LAB — COMPREHENSIVE METABOLIC PANEL
ALT: 16 U/L (ref 0–44)
AST: 19 U/L (ref 15–41)
Albumin: 3.6 g/dL (ref 3.5–5.0)
Alkaline Phosphatase: 93 U/L (ref 38–126)
Anion gap: 6 (ref 5–15)
BUN: 14 mg/dL (ref 6–20)
CO2: 26 mmol/L (ref 22–32)
Calcium: 9 mg/dL (ref 8.9–10.3)
Chloride: 109 mmol/L (ref 98–111)
Creatinine, Ser: 0.7 mg/dL (ref 0.44–1.00)
GFR calc Af Amer: >60 mL/min (ref >60)
GFR calc non Af Amer: >60 mL/min (ref >60)
Glucose, Bld: 106 mg/dL — ABNORMAL HIGH (ref 70–99)
Potassium: 4.2 mmol/L (ref 3.5–5.1)
Sodium: 141 mmol/L (ref 135–145)
Total Bilirubin: 0.5 mg/dL (ref 0.3–1.2)
Total Protein: 6.6 g/dL (ref 6.5–8.1)

## 2019-08-03 LAB — CBC WITH DIFFERENTIAL/PLATELET
Abs Immature Granulocytes: 0 10*3/uL (ref 0.00–0.07)
Basophils Absolute: 0 10*3/uL (ref 0.0–0.1)
Basophils Relative: 0 %
Eosinophils Absolute: 0 10*3/uL (ref 0.0–0.5)
Eosinophils Relative: 1 %
HCT: 40.9 % (ref 36.0–46.0)
Hemoglobin: 13.4 g/dL (ref 12.0–15.0)
Immature Granulocytes: 0 %
Lymphocytes Relative: 41 %
Lymphs Abs: 2.1 10*3/uL (ref 0.7–4.0)
MCH: 31.5 pg (ref 26.0–34.0)
MCHC: 32.8 g/dL (ref 30.0–36.0)
MCV: 96.2 fL (ref 80.0–100.0)
Monocytes Absolute: 0.4 10*3/uL (ref 0.1–1.0)
Monocytes Relative: 8 %
Neutro Abs: 2.5 10*3/uL (ref 1.7–7.7)
Neutrophils Relative %: 50 %
Platelets: 280 10*3/uL (ref 150–400)
RBC: 4.25 MIL/uL (ref 3.87–5.11)
RDW: 12.9 % (ref 11.5–15.5)
WBC: 5 10*3/uL (ref 4.0–10.5)
nRBC: 0 % (ref 0.0–0.2)

## 2019-08-03 NOTE — Therapy (Addendum)
[place] MAIN [facility] SERVICES [address] [place], [place], [address] Phone: [phone]   Fax:  [phone]  Physical Therapy Evaluation  Patient Details  Name: Sheena Martinez MRN: [id number] Date of Birth: 1960/09/03 Referring Provider (PT): Winter    Encounter Date: 08/02/2019  PT End of Session - 08/03/19 1453    Visit Number  1    Number of Visits  10    Date for PT Re-Evaluation  10/12/19    Authorization Type  reporting from 08/02/19    Authorization - Visit Number  1    Authorization - Number of Visits  10    PT Start Time  1100    PT Stop Time  1200    PT Time Calculation (min)  60 min       Past Medical History:  Diagnosis Date  . Anxiety   . Arthritis   . 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 GERD (gastroesophageal reflux disease)    OCC  . Goiter   . Hematuria 10/21/2018  . Hyperthyroidism   . Personal history of radiation therapy   . Sjogren's syndrome ([place])   . Uses continuous positive airway pressure (CPAP) ventilation at home 04/30/2019    Past Surgical History:  Procedure Laterality Date  . BREAST BIOPSY Left 05/12/2017   [place] bx/ INVASIVE MAMMARY CARCINOMA  . BREAST LUMPECTOMY Left 05/2017   invasive mammary carcinoma, neg margins  . BREAST LUMPECTOMY WITH SENTINEL LYMPH NODE BIOPSY Left 06/10/2017   Procedure: BREAST LUMPECTOMY WITH SENTINEL LYMPH NODE BX AND NEEDLE LOCALIZATION;  Surgeon: Robert Bellow, MD;  Location: [facility] ORS;  Service: General;  Laterality: Left;  . BREAST MAMMOSITE Left 06/24/2017   Procedure: MAMMOSITE BREAST;  Surgeon: Robert Bellow, MD;  Location: [facility] ORS;  Service: General;  Laterality: Left;  . CESAREAN SECTION  1995  . 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 COSMETIC SURGERY    . CYST  EXCISION  2018   pilar cyst/ Dr Will Bonnet  . EYE SURGERY    . PORTACATH PLACEMENT Right 07/08/2017   Procedure: INSERTION PORT-A-CATH;  Surgeon: Robert Bellow, MD;  Location: [facility] ORS;  Service: General;  Laterality: Right;  . TONSILLECTOMY      There were no vitals filed for this visit.   Subjective Assessment - 08/02/19 1101    Subjective  1) Pelvic pain started last week and it was really bad. Pt took a medication for it and it helped alot but it caused more dryness in her eyes which she has problems with. Pt was doing most of her knee and pelvic exercises prior to this pain episode but since her family members have moved in with her, pt has not been able to do them. Doing the exercises had helped her alot. "Its like my bones even hurt". Denied leakage, burning with urination, dificulty with urination.  Discomfort with sitting with pressure feeling.  The pain feels like "everything is inflammed."  Pt contines with relaxation techniques. Pt has not felt motivated to do her exercises.        2) Knee pain only when lying in bed on her side despite putting a wedge between knees 10/10, Pt does not lie on her back. Pt lies on her side. Pt has to get  up to relieve the pain. No pain with walking lately. Pt completed PT for the knee pain and it helped to decrease pain 10/10 to 1/10. Pt sleeps in her recliner.    Pertinent History  Pt's vision is still blurry. Current stressor with family member moving back in with her and husband and grandson indefinitely. Pt has cut out junk food the past 3 months. Pt used intermittent fasting and stopped eatting after 6pm.  Pt was having constipation as a side effect from another medication . Pt has since been taking Miralax and she no longer strains and is having more regular bowel movements.         [facility] PT Assessment - 08/02/19 1154      Observation/Other Assessments   Observations  less forward head, decreased uppertrap overuse   (Pended)       Palpation    SI assessment   SIJ limited in hip ext in supine  (Pended)                 Objective measurements completed on examination: See above findings.    Pelvic Floor Special Questions - 08/02/19 1151    Skin Integrity  Other    Skin Integrity other  sensation to Q-tip end "more irritated on L labia/   level of superficial transverse mm and decreased distinction between sharp and dull  pre Tx      External Palpation  without undergarments: signficant tightness at obt int L , deep transverse perineal L > R            PT Long Term Goals - 08/03/19 1454      PT LONG TERM GOAL #1   Title  Pt demo IND and compliance with HEP across 94month , atleast 3 x week in order to minimize risk for relapse    Baseline  not compliant with previous PT exercises    Time  10    Period  Weeks    Status  New    Target Date  10/12/19      PT LONG TERM GOAL #2   Title  Pt will demo decreased L pelvic floor mm tightness across 2 sessions in order to restore pelvic floor function    Baseline  increased mm tightness on L pelvic floor    Time  4    Period  Weeks    Status  New    Target Date  08/31/19      PT LONG TERM GOAL #3   Title  Pt will demo proper pelvic floor lengthening and coordination and report decreased crossing of legs when sitting in order to maintain pelvic floor pliability for better function    Baseline  8    Time  8    Period  Weeks    Status  New    Target Date  09/28/19      PT LONG TERM GOAL #4   Title  Pt will demo bilateral distinction of sensation between sharp and dull and sensation at L transverse perineal mm across 2 visits in order to restore neural mobility and decrease pain    Baseline  decreased distinction of sensation between sharp and dull and sensation at L transverse perineal mm    Time  2    Period  Weeks    Status  New    Target Date  08/17/19                Plan - 08/03/19 1453  Clinical Impression Statement  Pt is a 58 yo female who  reports pelvic pain relapse. Pt was doing most of her knee and pelvic exercises prior to this pain episode but since her family members have moved in with her, pt has not been able to do them. Doing the exercises had helped her a lot before this relapse episode.  Denied leakage, burning with urination, dificulty with urination.  Discomfort with sitting with pressure feeling.  The pain feels like "everything is inflammed."  Pt contines with relaxation techniques but pt has not felt motivated to do her exercises.  Pt also reported B knee pain with placing a pillow between her knees when lying on her side to sleep. Pt sleeps in her recliner.  Pt completed PT for the knee pain and it helped to decrease pain 10/10 to 1/10.   Clinical presentation includes signficantly increased L pelvic floor mm tightness, decreased sensation/ decreased distinction between sharp/ dull  with Q-tip test at L pelvic floor at the level of superficial transverse mm and decreased distinction between sharp and dull. These impairments likely associated with pt's report of crossing her leg often. Educated pt on the importance of sleeping in a regular bed instead of her recliner and to decrease time in recliner along with decreasing crossing legs.  B knee pain is likely associated with pt's habit of crossing legs as well due to overuse of adductors and poor alignment of hip, BLE.  Following Tx today, pt demo'd decreased L pelvic floor tightness . Plan to assess adductors mm at next session. Provided biopsychosocial approaches, motivational interviewing to co-create with pt strategies on building compliance to her PT HEP.  Pt benefits from skilled PT.            Personal Factors and Comorbidities  Comorbidity 3+   Comorbidities  breast cancer with radiation, chemotherapy; Sjorgens syndrome;  blurred vision due to cornea transplant surgery, anxiety, hyperthyroidism    Examination-Activity Limitations     Examination-Participation  Restrictions  Community Activity;Shop;Other   sleeping on her bed   Stability/Clinical Decision Making  Evolving/Moderate complexity    Rehab Potential  Good    PT Frequency  1x / week    PT Duration  10 weeks    PT Treatment/Interventions  Joint Manipulations;ADLs/Self Care Home Management;Cryotherapy;Therapeutic activities;Functional mobility training;Therapeutic exercise;Balance training;Neuromuscular re-education;Patient/family education;Manual techniques;Passive range of motion;Dry needling;Other (comment);Moist Heat;Electrical Stimulation;Gait training   joint mobilization grades I-V           Consulted and Agree with Plan of Care  Patient       Patient will benefit from skilled therapeutic intervention in order to improve the following deficits and impairments:  Decreased balance, Decreased endurance, Abnormal gait, Increased muscle spasms, Decreased activity tolerance, Decreased strength, Pain, Improper body mechanics, Difficulty walking, Obesity, Impaired perceived functional ability, Impaired flexibility  Visit Diagnosis: Other muscle spasm  Abnormal posture     Problem List Patient Active Problem List   Diagnosis Date Noted  . Osteopenia 03/18/2019  . Facial flushing 10/21/2018  . Tinnitus of both ears 10/21/2018  . Drug-induced neutropenia ([place]) 10/20/2018  . 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 Thrombocytosis ([place]) 10/20/2018  . 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 Irregular menstrual cycle 03/02/2018  . Screening for cervical cancer 03/02/2018  . Long-term current use of tamoxifen 02/17/2018  . 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 Anxiety 10/03/2017  . Bilateral primary osteoarthritis of knee 10/03/2017  . Peripheral ulcerative keratitis  10/01/2017  . Malignant neoplasm of upper-inner quadrant of left breast in female, estrogen receptor positive ([place]) 05/22/2017  . Hirsutism 11/24/2015  .  Hyperlipidemia 02/01/2014  . Impaired glucose tolerance 02/01/2014  . Vitamin D deficiency 02/01/2014  . Family history of diabetes mellitus 01/31/2014  . Morbid obesity with BMI of 40.0-44.9, adult ([place]) 01/31/2014  . Sjogren's syndrome ([place]) 01/17/2014    Jerl Mina ,PT, DPT, E-RYT  08/03/2019, 3:59 PM  [place] MAIN [facility] SERVICES [address] [place], [place], [address] Phone: [phone]   Fax:  [phone]  Name: EMALYN BOGACZ MRN: [id number] Date of Birth: 1961-03-16

## 2019-08-03 NOTE — Addendum Note (Signed)
Addended by: Jerl Mina on: 08/03/2019 04:19 PM   Modules accepted: Orders

## 2019-08-04 DIAGNOSIS — H01002 Unspecified blepharitis right lower eyelid: Secondary | ICD-10-CM | POA: Diagnosis not present

## 2019-08-05 ENCOUNTER — Other Ambulatory Visit: Payer: Self-pay | Admitting: Family Medicine

## 2019-08-06 IMAGING — MG MM DIGITAL SCREENING BILAT W/ TOMO W/ CAD
8 of 14 series · 8 of 30 positions shown · non-contrast
Comparison: Previous exam(s).

CLINICAL DATA: Screening.

EXAM:
2D DIGITAL SCREENING BILATERAL MAMMOGRAM WITH CAD AND ADJUNCT TOMO

[L MLO synth-2D]
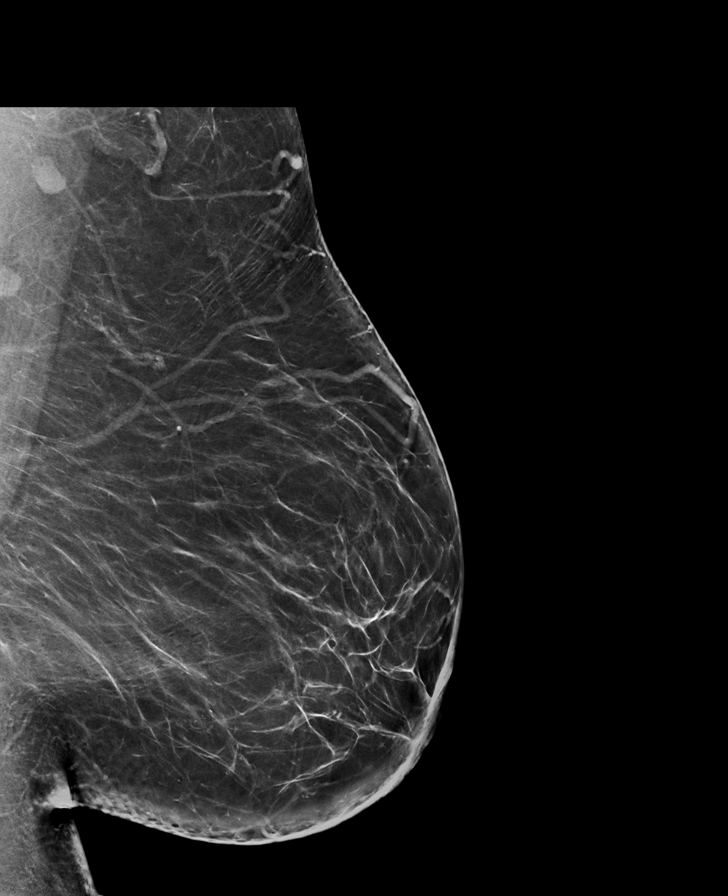

[R MLO]
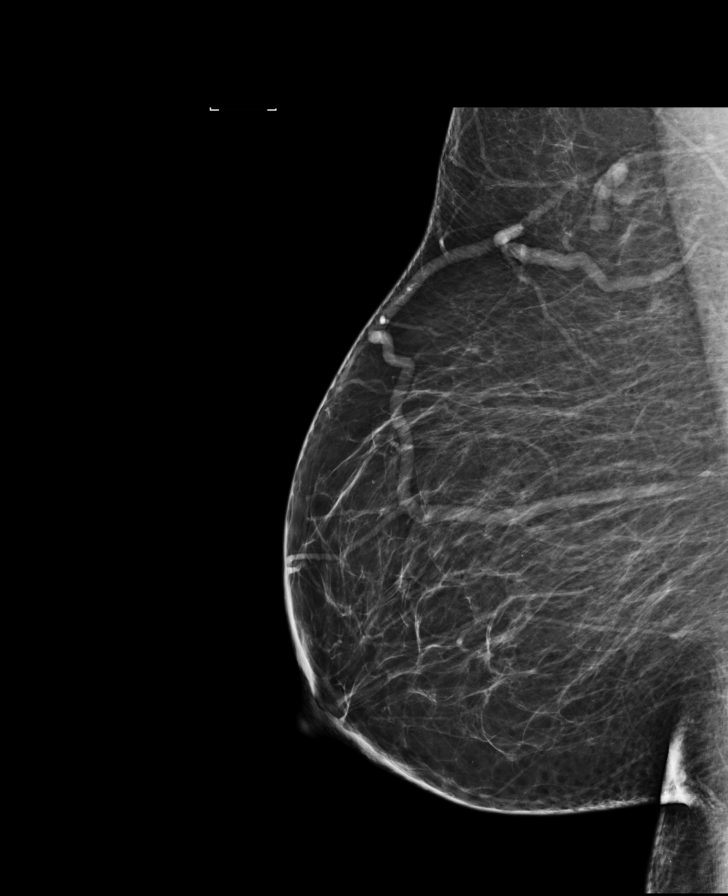

[R MLO synth-2D]
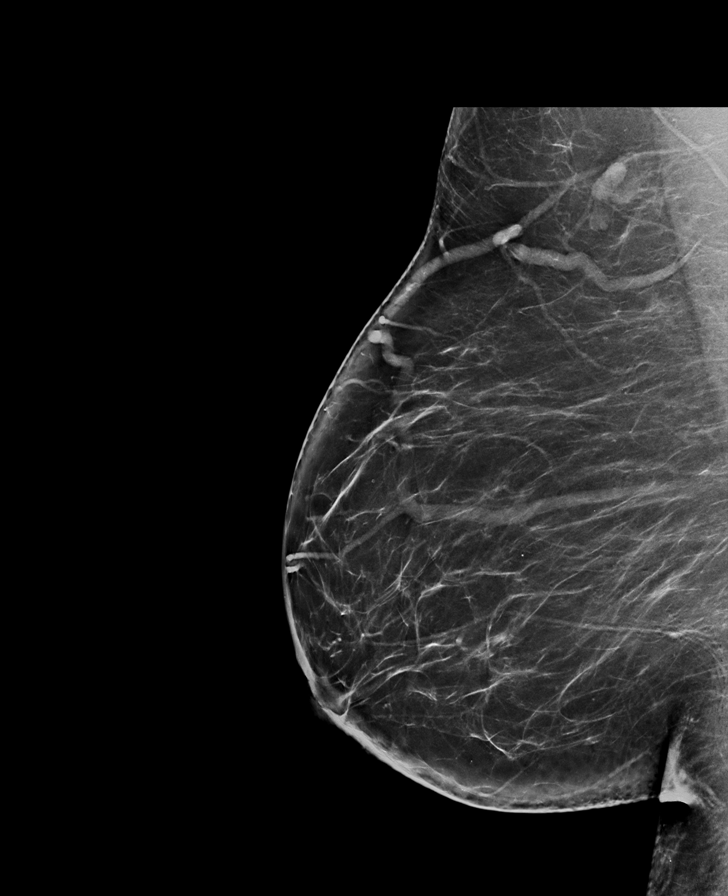

[R CC synth-2D]
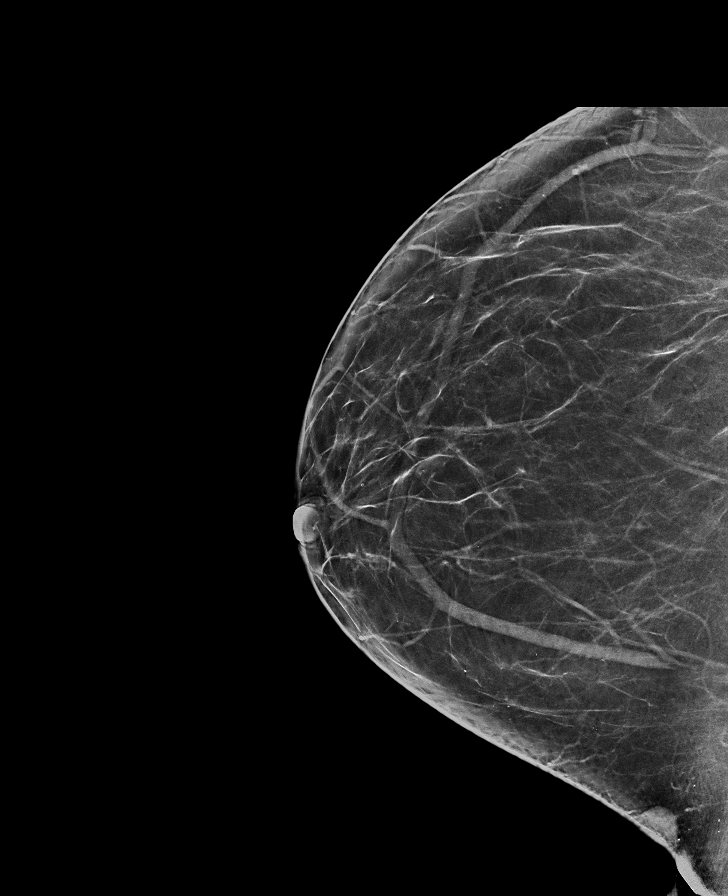

[L CC]
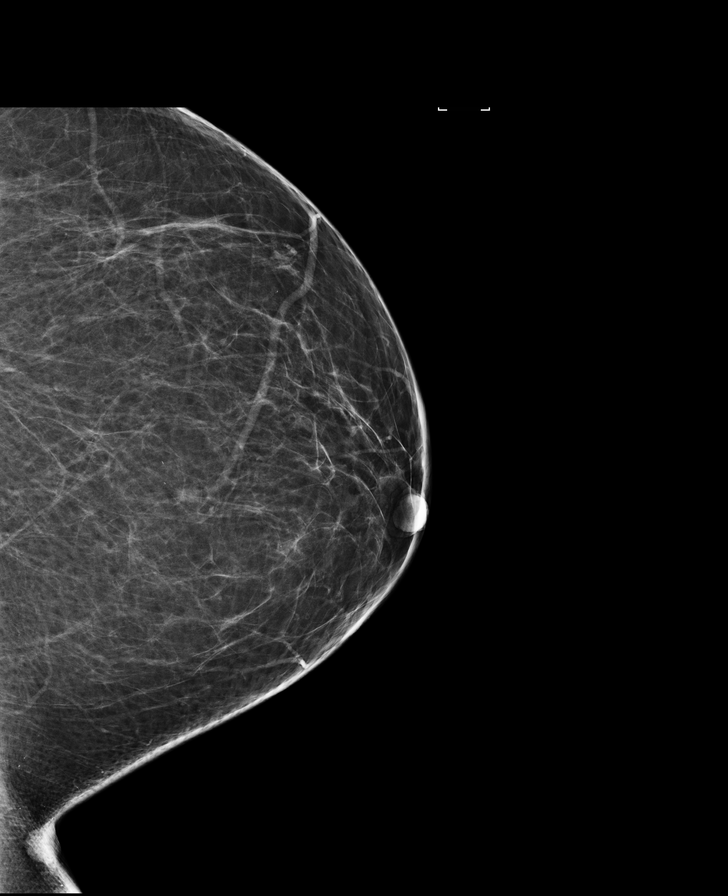

[L CC synth-2D]
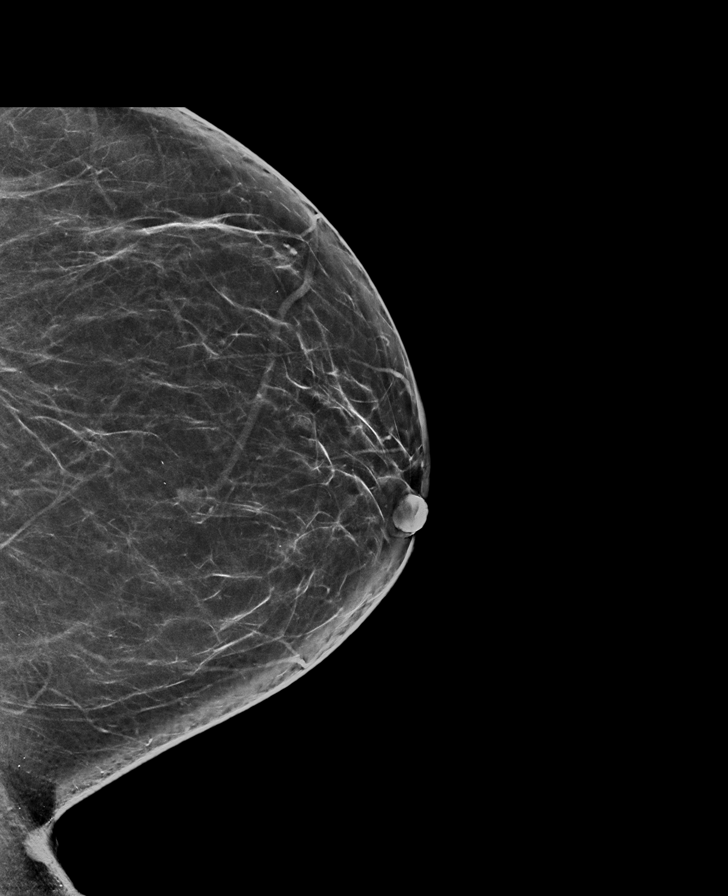

[L MLO]
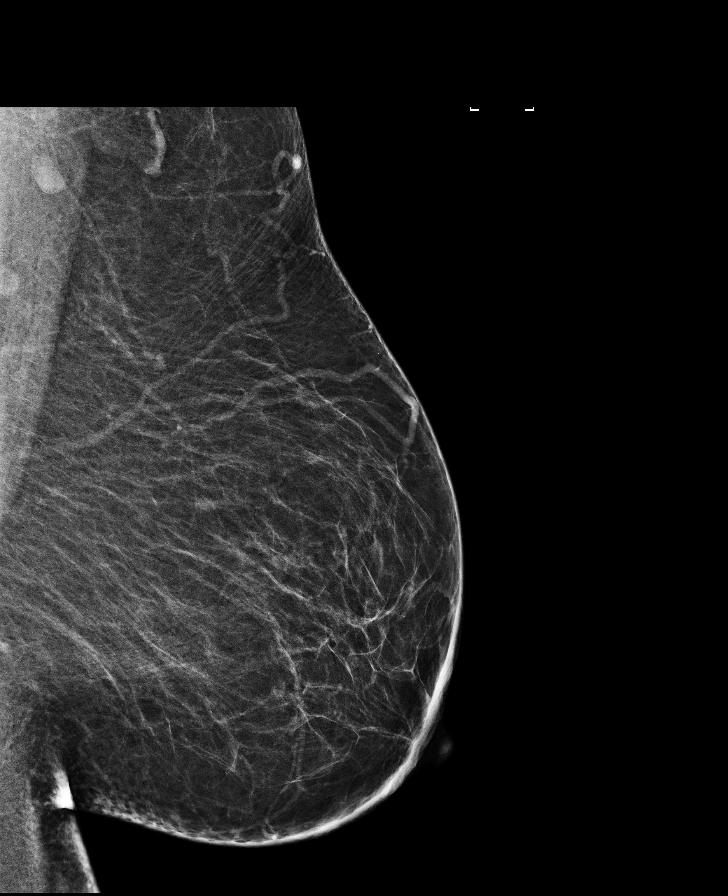

[R CC]
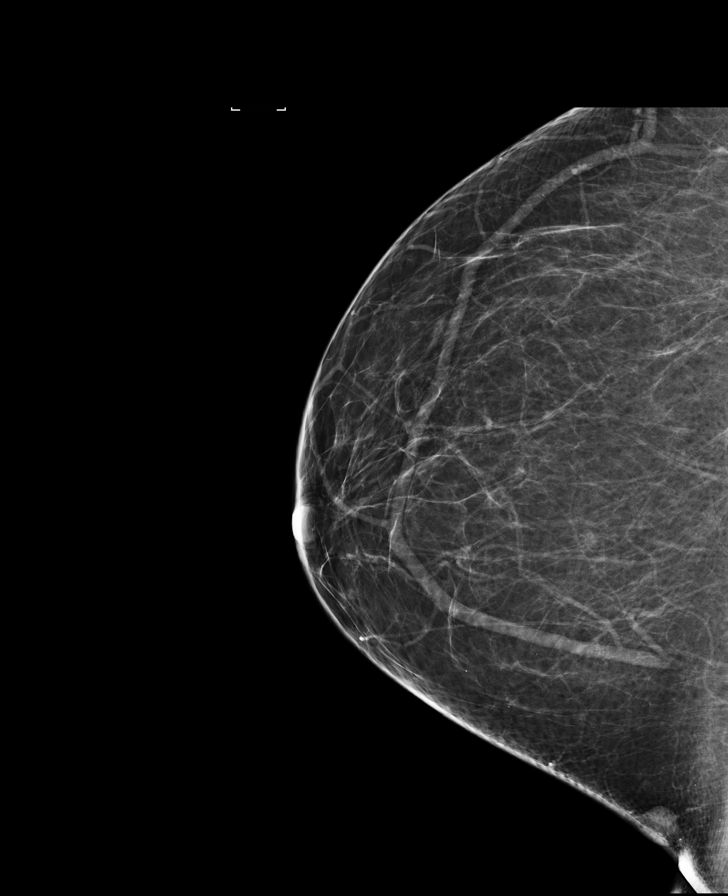

[8 of 30 positions shown; findings below may reference images not displayed]

ACR Breast Density Category b: There are scattered areas of
fibroglandular density.
FINDINGS: In the left breast, a possible mass warrants further evaluation. In
the right breast, no findings suspicious for malignancy.

Images were processed with CAD.
IMPRESSION: Further evaluation is suggested for possible mass in the left
breast.

RECOMMENDATION:
Diagnostic mammogram and possibly ultrasound of the left breast.
(Code:[id number])

The patient will be contacted regarding the findings, and additional
imaging will be scheduled.

BI-RADS CATEGORY  0: Incomplete. Need additional imaging evaluation
and/or prior mammograms for comparison.

## 2019-08-09 ENCOUNTER — Inpatient Hospital Stay (HOSPITAL_BASED_OUTPATIENT_CLINIC_OR_DEPARTMENT_OTHER): Payer: Medicaid Other | Admitting: Oncology

## 2019-08-09 ENCOUNTER — Inpatient Hospital Stay: Payer: Medicaid Other

## 2019-08-09 ENCOUNTER — Encounter: Payer: Self-pay | Admitting: Oncology

## 2019-08-09 DIAGNOSIS — C50212 Malignant neoplasm of upper-inner quadrant of left female breast: Secondary | ICD-10-CM

## 2019-08-09 DIAGNOSIS — Z808 Family history of malignant neoplasm of other organs or systems: Secondary | ICD-10-CM | POA: Diagnosis not present

## 2019-08-09 DIAGNOSIS — H16002 Unspecified corneal ulcer, left eye: Secondary | ICD-10-CM

## 2019-08-09 DIAGNOSIS — F411 Generalized anxiety disorder: Secondary | ICD-10-CM

## 2019-08-09 DIAGNOSIS — Z806 Family history of leukemia: Secondary | ICD-10-CM | POA: Diagnosis not present

## 2019-08-09 DIAGNOSIS — Z79899 Other long term (current) drug therapy: Secondary | ICD-10-CM | POA: Diagnosis not present

## 2019-08-09 DIAGNOSIS — Z801 Family history of malignant neoplasm of trachea, bronchus and lung: Secondary | ICD-10-CM | POA: Diagnosis not present

## 2019-08-09 DIAGNOSIS — E039 Hypothyroidism, unspecified: Secondary | ICD-10-CM | POA: Diagnosis not present

## 2019-08-09 DIAGNOSIS — Z8041 Family history of malignant neoplasm of ovary: Secondary | ICD-10-CM | POA: Diagnosis not present

## 2019-08-09 DIAGNOSIS — E0590 Thyrotoxicosis, unspecified without thyrotoxic crisis or storm: Secondary | ICD-10-CM

## 2019-08-09 DIAGNOSIS — Z170 Estrogen receptor positive status [ER+]: Secondary | ICD-10-CM

## 2019-08-09 DIAGNOSIS — Z87891 Personal history of nicotine dependence: Secondary | ICD-10-CM | POA: Diagnosis not present

## 2019-08-09 DIAGNOSIS — M8580 Other specified disorders of bone density and structure, unspecified site: Secondary | ICD-10-CM | POA: Diagnosis not present

## 2019-08-09 DIAGNOSIS — R232 Flushing: Secondary | ICD-10-CM | POA: Diagnosis not present

## 2019-08-09 DIAGNOSIS — Z888 Allergy status to other drugs, medicaments and biological substances status: Secondary | ICD-10-CM | POA: Diagnosis not present

## 2019-08-09 DIAGNOSIS — M3500 Sicca syndrome, unspecified: Secondary | ICD-10-CM | POA: Diagnosis not present

## 2019-08-09 DIAGNOSIS — Z803 Family history of malignant neoplasm of breast: Secondary | ICD-10-CM | POA: Diagnosis not present

## 2019-08-09 DIAGNOSIS — Z8379 Family history of other diseases of the digestive system: Secondary | ICD-10-CM | POA: Diagnosis not present

## 2019-08-09 DIAGNOSIS — Z833 Family history of diabetes mellitus: Secondary | ICD-10-CM | POA: Diagnosis not present

## 2019-08-09 DIAGNOSIS — Z95828 Presence of other vascular implants and grafts: Secondary | ICD-10-CM

## 2019-08-09 DIAGNOSIS — Z832 Family history of diseases of the blood and blood-forming organs and certain disorders involving the immune mechanism: Secondary | ICD-10-CM | POA: Diagnosis not present

## 2019-08-09 DIAGNOSIS — Z881 Allergy status to other antibiotic agents status: Secondary | ICD-10-CM | POA: Diagnosis not present

## 2019-08-09 DIAGNOSIS — Z79810 Long term (current) use of selective estrogen receptor modulators (SERMs): Secondary | ICD-10-CM

## 2019-08-09 DIAGNOSIS — Z800 Family history of malignant neoplasm of digestive organs: Secondary | ICD-10-CM | POA: Diagnosis not present

## 2019-08-09 DIAGNOSIS — Z560 Unemployment, unspecified: Secondary | ICD-10-CM | POA: Diagnosis not present

## 2019-08-09 DIAGNOSIS — M1990 Unspecified osteoarthritis, unspecified site: Secondary | ICD-10-CM | POA: Diagnosis not present

## 2019-08-09 NOTE — Progress Notes (Signed)
HEMATOLOGY-ONCOLOGY TeleHEALTH VISIT PROGRESS NOTE  I connected with Sheena Martinez on 08/09/19 at  1:00 PM EST by video enabled telemedicine visit and verified that I am speaking with the correct person using two identifiers. I discussed the limitations, risks, security and privacy concerns of performing an evaluation and management service by telemedicine and the availability of in-person appointments. I also discussed with the patient that there may be a patient responsible charge related to this service. The patient expressed understanding and agreed to proceed.   Other persons participating in the visit and their role in the encounter:  None  Patient's location: Home  Provider's location: office Chief Complaint: Follow-up for breast cancer management   INTERVAL HISTORY Sheena Martinez is a 58 y.o. female who has above history reviewed by me today presents for follow up visit for management of breast cancer management Problems and complaints are listed below:  Patient has been taking tamoxifen 20 mg daily. Last menstrual period 01/05/2018. Patient follows up with [facility] psychiatrist for anxiety and has been on Effexor.  She recently increased Effexor to 150 mg which did not help her symptoms further.  So now she is back to 75 mg Effexor daily. Hypothyroidism, on methimazole.  She follows up with Dr. Sharrie Rothman and plan to have iodine 131 ablation in January. Patient has been advised by endocrinology to stop methimazole 3 weeks prior to her procedure. Patient follows up with ophthalmology for corneal ulcer.  Patient recommend her symptoms are stable. Bilateral diagnostic mammogram was done on 05/06/2019 Patient reports that she has been doing intermittent fasting and has lost 10 pounds.  She feels that she should have lost more weight and asks if any of her medication may have caused weight gain. She denies any concerns with her breast today. I attempted to connect the patient for visual enabled  telehealth visit.  Due to the technical difficulties with video,  Patient was transitioned to audio only visit.   Review of Systems  Constitutional: Negative for appetite change, chills, fatigue and fever.  HENT:   Negative for hearing loss and voice change.        Chronic eye issue, follows up with ophthalmologist  Eyes: Negative for eye problems.  Respiratory: Negative for chest tightness and cough.   Cardiovascular: Negative for chest pain.  Gastrointestinal: Negative for abdominal distention, abdominal pain and blood in stool.  Endocrine: Positive for hot flashes.  Genitourinary: Negative for difficulty urinating and frequency.   Musculoskeletal: Negative for arthralgias.  Skin: Negative for itching and rash.  Neurological: Negative for extremity weakness.  Hematological: Negative for adenopathy.  Psychiatric/Behavioral: Negative for confusion. The patient is nervous/anxious.     Past Medical History:  Diagnosis Date  . Anxiety   . Arthritis   . 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 GERD (gastroesophageal reflux disease)    OCC  . Goiter   . Hematuria 10/21/2018  . Hyperthyroidism   . Personal history of radiation therapy   . Sjogren's syndrome ([place])   . Uses continuous positive airway pressure (CPAP) ventilation at home 04/30/2019   Past Surgical History:  Procedure Laterality Date  . BREAST BIOPSY Left 05/12/2017   [place] bx/ INVASIVE MAMMARY CARCINOMA  . BREAST LUMPECTOMY Left 05/2017   invasive mammary carcinoma, neg margins  . BREAST LUMPECTOMY WITH SENTINEL  LYMPH NODE BIOPSY Left 06/10/2017   Procedure: BREAST LUMPECTOMY WITH SENTINEL LYMPH NODE BX AND NEEDLE LOCALIZATION;  Surgeon: Robert Bellow, MD;  Location: [facility] ORS;  Service: General;  Laterality: Left;  . BREAST MAMMOSITE Left  06/24/2017   Procedure: MAMMOSITE BREAST;  Surgeon: Robert Bellow, MD;  Location: [facility] ORS;  Service: General;  Laterality: Left;  . CESAREAN SECTION  1995  . 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 COSMETIC SURGERY    . CYST EXCISION  2018   pilar cyst/ Dr Will Bonnet  . EYE SURGERY    . PORTACATH PLACEMENT Right 07/08/2017   Procedure: INSERTION PORT-A-CATH;  Surgeon: Robert Bellow, MD;  Location: [facility] ORS;  Service: General;  Laterality: Right;  . TONSILLECTOMY      Family History  Problem Relation Age of Onset  . Breast cancer Maternal Aunt 22       currently ~65  . Diabetes Mother   . Skin cancer Mother        currently 65; TAH/BSO (age?)  . Anemia Mother   . Liver disease Father        deceased / not much info about him / alcoholic  . Lung cancer Paternal Aunt        smoker / deceased 48s  . Stomach cancer Paternal Uncle 80       deceased / deceased 48s  . Ovarian cancer Maternal Grandmother 11       deceased 68s  . Stomach cancer Paternal Grandfather 26       deceased 33s  . Cancer Maternal Uncle        unk. type; deceased 90s  . Leukemia Cousin 63       deceased 39    Social History   Socioeconomic History  . Marital status: Married    Spouse name: Eulas Post  . Number of children: 3  . Years of education: Not on file  . Highest education level: GED or equivalent  Occupational History  . Occupation: unemployed  Tobacco Use  . Smoking status: Former Smoker    Years: 7.00    Types: Cigarettes    Quit date: 08/13/1979    Years since quitting: 40.0  . Smokeless tobacco: Never Used  . Tobacco comment: AGE [age]  Substance and Sexual Activity  . Alcohol use: No  . Drug use: No  . Sexual activity: Yes    Partners: Male  Other Topics Concern  . Not on file  Social History Narrative  . Not on file   Social Determinants of Health   Financial Resource Strain:   . Difficulty of Paying Living Expenses: Not on file  Food Insecurity:   . Worried About Paediatric nurse in the Last Year: Not on file  . Ran Out of Food in the Last Year: Not on file  Transportation Needs:   . Lack of Transportation (Medical): Not on file  . Lack of Transportation (Non-Medical): Not on file  Physical Activity:   . Days of Exercise per Week: Not on file  . Minutes of Exercise per Session: Not on file  Stress:   . Feeling of Stress : Not on file  Social Connections:   . Frequency of Communication with Friends and Family: Not on file  . Frequency of Social Gatherings with Friends and Family: Not on file  . Attends Religious Services: Not on file  . Active Member of Clubs or Organizations: Not on file  . Attends Club  or Organization Meetings: Not on file  . Marital Status: Not on file  Intimate Partner Violence:   . Fear of Current or Ex-Partner: Not on file  . Emotionally Abused: Not on file  . Physically Abused: Not on file  . Sexually Abused: Not on file    Current Outpatient Medications on File Prior to Visit  Medication Sig Dispense Refill  . acetaminophen (TYLENOL) 500 MG tablet Take 1,000 mg by mouth every 8 (eight) hours as needed for mild pain or headache.    . brimonidine (ALPHAGAN) 0.2 % ophthalmic solution INSTILL 1 DROP INTO LEFT EYE THREE TIMES DAILY  12  . Ca Carbonate-Mag Hydroxide (ROLAIDS PO) Take 1 tablet as needed by mouth (indigestion).     . cholecalciferol (VITAMIN D) 1000 units tablet Take 2,000 Units by mouth daily.    Marland Kitchen doxycycline (ORACEA) 40 MG capsule Take by mouth.    Marland Kitchen ketoconazole (NIZORAL) 2 % shampoo Apply 1 application topically 2 (two) times a week. 120 mL 3  . LORazepam (ATIVAN) 0.5 MG tablet   0  . meloxicam (MOBIC) 15 MG tablet Take 1/2 (one-half) tablet by mouth once daily 90 tablet 0  . tamoxifen (NOLVADEX) 20 MG tablet Take 1 tablet (20 mg total) by mouth daily. 90 tablet 0  . Timolol Maleate 0.5 % (DAILY) SOLN Place 1 drop into the left eye 2 (two) times daily.    . tizanidine (ZANAFLEX) 2 MG capsule Take 1 capsule  (2 mg total) by mouth 3 (three) times daily. 30 capsule 1  . venlafaxine XR (EFFEXOR-XR) 150 MG 24 hr capsule     . vitamin k 100 MCG tablet Take 100 mcg by mouth 2 (two) times a week.    . methimazole (TAPAZOLE) 10 MG tablet TAKE 1 TABLET BY MOUTH ONCE DAILY AT 6AM  2  . VESICARE 5 MG tablet Take 5 mg by mouth daily.     No current facility-administered medications on file prior to visit.    Allergies  Allergen Reactions  . Rituximab Anaphylaxis  . Benadryl [Diphenhydramine] Other (See Comments)    Patient felt like she was going to "crawl out of her skin"  . Cortisone Other (See Comments)    Also reacts to cortisone injections; they make her flushed  . Diphenhydramine Hcl   . Clonazepam Anxiety  . Sulfamethoxazole-Trimethoprim Rash    Chest tightness. Bactrim       Observations/Objective: Today's Vitals   08/09/19 1155  PainSc: 0-No pain   There is no height or weight on file to calculate BMI.  Physical Exam  Constitutional: She is oriented to person, place, and time. No distress.  Neurological: She is alert and oriented to person, place, and time.  Psychiatric: Mood normal.    CBC    Component Value Date/Time   WBC 5.0 08/03/2019 1350   RBC 4.25 08/03/2019 1350   HGB 13.4 08/03/2019 1350   HCT 40.9 08/03/2019 1350   PLT 280 08/03/2019 1350   MCV 96.2 08/03/2019 1350   MCH 31.5 08/03/2019 1350   MCHC 32.8 08/03/2019 1350   RDW 12.9 08/03/2019 1350   LYMPHSABS 2.1 08/03/2019 1350   MONOABS 0.4 08/03/2019 1350   EOSABS 0.0 08/03/2019 1350   BASOSABS 0.0 08/03/2019 1350    CMP     Component Value Date/Time   NA 141 08/03/2019 1350   K 4.2 08/03/2019 1350   CL 109 08/03/2019 1350   CO2 26 08/03/2019 1350   GLUCOSE 106 (H) 08/03/2019  1350   BUN 14 08/03/2019 1350   CREATININE 0.70 08/03/2019 1350   CALCIUM 9.0 08/03/2019 1350   PROT 6.6 08/03/2019 1350   ALBUMIN 3.6 08/03/2019 1350   AST 19 08/03/2019 1350   ALT 16 08/03/2019 1350   ALKPHOS 93 08/03/2019  1350   BILITOT 0.5 08/03/2019 1350   GFRNONAA >60 08/03/2019 1350   GFRAA >60 08/03/2019 1350    RADIOGRAPHIC STUDIES: I have personally reviewed the radiological images as listed and agreed with the findings in the report. No results found.  Assessment and Plan: 1. Malignant neoplasm of upper-inner quadrant of left breast in female, estrogen receptor positive ([place])   2. Port-A-Cath in place   3. Long-term current use of tamoxifen   4. Generalized anxiety disorder   5. Hyperthyroidism   6. Cornea ulcer, left     #Stage IA, left Breast cancer Labs are reviewed and discussed with patient. Continue tamoxifen 20 mg daily. We will check patient's FSH and LH.  If she is confirmed to be in postmenopausal state, we will discuss about switching to aromatase inhibitor. 05/06/2019 bilateral diagnostic mammogram showed no mammographic evidence of malignancy in either breast. Recommend continue annual diagnostic mammogram.  #Osteopenia, recommend patient to continue calcium and vitamin D supplementation. She has not obtained dental clearance yet. #Anxiety and hot flash, continue Effexor which may potentially help her hot flash while taking tamoxifen. #Hypothyroidism, continue follow-up with endocrinologist.  Patient has iodine 131 ablation scheduled in January.  #Corneal ulcer, secondary to autoimmune disorder.  Continue follow-up with ophthalmology.   #Intentional weight loss due to dieting, Continue patient's efforts in healthy diet size. #Port-A-Cath in place, continue port flush every 8 weeks. Follow Up Instructions: 6 months   I discussed the assessment and treatment plan with the patient. The patient was provided an opportunity to ask questions and all were answered. The patient agreed with the plan and demonstrated an understanding of the instructions.  The patient was advised to call back or seek an in-person evaluation if the symptoms worsen or if the condition fails to improve as  anticipated.   Earlie Server, MD 08/09/2019 4:09 PM

## 2019-08-09 NOTE — Progress Notes (Signed)
Patient verified using two identifiers for virtual visit via telephone today.  Patient does not offer any problems today.  

## 2019-08-10 ENCOUNTER — Encounter: Payer: Medicaid Other | Admitting: Physical Therapy

## 2019-08-10 DIAGNOSIS — M170 Bilateral primary osteoarthritis of knee: Secondary | ICD-10-CM | POA: Diagnosis not present

## 2019-08-11 DIAGNOSIS — H169 Unspecified keratitis: Secondary | ICD-10-CM | POA: Diagnosis not present

## 2019-08-12 ENCOUNTER — Other Ambulatory Visit: Payer: Self-pay

## 2019-08-12 ENCOUNTER — Ambulatory Visit: Payer: Medicaid Other | Admitting: Physical Therapy

## 2019-08-12 DIAGNOSIS — R293 Abnormal posture: Secondary | ICD-10-CM | POA: Diagnosis not present

## 2019-08-12 DIAGNOSIS — M62838 Other muscle spasm: Secondary | ICD-10-CM | POA: Diagnosis not present

## 2019-08-12 NOTE — Patient Instructions (Addendum)
Angel wings 10 reps  Open Book 10 reps   Knee to chest on exhale 10 on each leg while the opposite knee is bent, foot on the bed  Rocking of the knees side to side 10 reps   Deep core level 1 ( breathing 10reps)  Deep core level 2 (6 min) moving one knee out at a time

## 2019-08-12 NOTE — Therapy (Addendum)
[place] MAIN [facility] SERVICES [address] [place], [place], [address] Phone: [phone]   Fax:  [phone]  Physical Therapy Treatment  Patient Details  Name: Sheena Martinez MRN: [id number] Date of Birth: Dec 26, 1960 Referring Provider (PT): Winter    Encounter Date: 08/12/2019    Past Medical History:  Diagnosis Date  . Anxiety   . Arthritis   . 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 GERD (gastroesophageal reflux disease)    OCC  . Goiter   . Hematuria 10/21/2018  . Hyperthyroidism   . Personal history of radiation therapy   . Sjogren's syndrome ([place])   . Uses continuous positive airway pressure (CPAP) ventilation at home 04/30/2019    Past Surgical History:  Procedure Laterality Date  . BREAST BIOPSY Left 05/12/2017   [place] bx/ INVASIVE MAMMARY CARCINOMA  . BREAST LUMPECTOMY Left 05/2017   invasive mammary carcinoma, neg margins  . BREAST LUMPECTOMY WITH SENTINEL LYMPH NODE BIOPSY Left 06/10/2017   Procedure: BREAST LUMPECTOMY WITH SENTINEL LYMPH NODE BX AND NEEDLE LOCALIZATION;  Surgeon: Robert Bellow, MD;  Location: [facility] ORS;  Service: General;  Laterality: Left;  . BREAST MAMMOSITE Left 06/24/2017   Procedure: MAMMOSITE BREAST;  Surgeon: Robert Bellow, MD;  Location: [facility] ORS;  Service: General;  Laterality: Left;  . CESAREAN SECTION  1995  . 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 COSMETIC SURGERY    . CYST EXCISION  2018   pilar cyst/ Dr Will Bonnet  . EYE SURGERY    . PORTACATH PLACEMENT Right 07/08/2017   Procedure: INSERTION PORT-A-CATH;  Surgeon: Robert Bellow, MD;  Location: [facility] ORS;  Service: General;  Laterality: Right;  . TONSILLECTOMY      There were no vitals filed for this visit.  Subjective Assessment - 08/16/19 1628     Subjective  Pt rpeorted she had very mild pelvic pain since last session. Pt did not get to do any of her exercises done this past week.  Yesterday pt walked a long distance in [place] and another store and felt a pain in her back and then this morning the pelvic pain returned but at a slightly better level    Pertinent History  Pt's vision is still blurry. Current stressor with family member moving back in with her and husband and grandson indefinitely. Pt has cut out junk food the past 3 months. Pt used intermittent fasting and stopped eatting after 6pm.  Pt was having constipation as a side effect from another medication      . Pt has since been taking Miralax and she no longer strains and is having more regular bowel movements.                       [place] Adult PT Treatment/Exercise - 08/16/19 1629      Therapeutic Activites    ADL's  motivational interviewing to build compliance to HEP , collaborated strategies with patient       Neuro Re-ed    Neuro Re-ed Details    L5/S pelvic tilts , stretches in form and technique      Manual Therapy   Manual therapy comments  STM at paraspinal B LBP ,  PT Short Term Goals - 05/18/19 1554      PT SHORT TERM GOAL #1   Title  Be independent with initial home exercise program for self-management of symptoms.    Baseline  initial HEP provided at IE (05/04/2019);    Time  2    Period  Weeks    Status  Achieved    Target Date  05/18/19        PT Long Term Goals - 08/16/19 1627      PT LONG TERM GOAL #1   Title  Pt demo IND and compliance with HEP across 34month , atleast 3 x week in order to minimize risk for relapse    Baseline  not compliant with previous PT exercises    Time  10    Period  Weeks    Status  On-going      PT LONG TERM GOAL #2   Title  Pt will demo decreased L pelvic floor mm tightness across 2 sessions in order to restore pelvic floor function    Baseline  increased mm tightness on L  pelvic floor  ( 08/12/19: no tightness )    Time  4    Period  Weeks    Status  Achieved      PT LONG TERM GOAL #3   Title  Pt will demo proper pelvic floor lengthening and coordination and report decreased crossing of legs when sitting in order to maintain pelvic floor pliability for better function    Baseline  8    Time  8    Period  Weeks    Status  On-going      PT LONG TERM GOAL #4   Title  Pt will demo bilateral distinction of sensation between sharp and dull and sensation at L transverse perineal mm across 2 visits in order to restore neural mobility and decrease pain    Baseline  decreased distinction of sensation between sharp and dull and sensation at L transverse perineal mm    Time  2    Period  Weeks    Status  On-going            Plan - 08/16/19 1630    Clinical Impression Statement Pt demo'd improvement from last session with no more L pelvic floor tightness. Pt showed increased paraspinal B mm tightness which improved with manual Tx and therapeutic stretches. Facilitated pt through stretches to build compliance and motivation on the effect of stretches on LBP. Required motivational interviewing strategies again today to reinforce HEP compliance. Pt continues to benefit from skilled PT.    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, anxiety, hyperthyroidism    Examination-Activity Limitations  Sleep;Sit;Stand;Caring for Others;Squat    Examination-Participation Restrictions  Community Activity;Shop;Other   sleeping on her bed   Stability/Clinical Decision Making  Evolving/Moderate complexity    Rehab Potential  Good    PT Frequency  1x / week    PT Duration  Other (comment)   10   PT Treatment/Interventions  Joint Manipulations;ADLs/Self Care Home  Management;Cryotherapy;Therapeutic activities;Functional mobility training;Therapeutic exercise;Balance training;Neuromuscular re-education;Patient/family education;Manual techniques;Passive range of motion;Dry needling;Other (comment);Moist Heat;Electrical Stimulation;Gait training;Scar mobilization;Manual lymph drainage   joint mobilization grades I-V   Consulted and Agree with Plan of Care  Patient       Patient will benefit from skilled therapeutic intervention in  order to improve the following deficits and impairments:  Decreased balance, Decreased endurance, Abnormal gait, Increased muscle spasms, Decreased activity tolerance, Decreased strength, Pain, Improper body mechanics, Difficulty walking, Obesity, Impaired perceived functional ability, Impaired flexibility  Visit Diagnosis: Other muscle spasm  Abnormal posture     Problem List Patient Active Problem List   Diagnosis Date Noted  . Osteopenia 03/18/2019  . Facial flushing 10/21/2018  . Tinnitus of both ears 10/21/2018  . Drug-induced neutropenia ([place]) 10/20/2018  . 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 Thrombocytosis ([place]) 10/20/2018  . 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 Irregular menstrual cycle 03/02/2018  . Screening for cervical cancer 03/02/2018  . Long-term current use of tamoxifen 02/17/2018  . 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 Anxiety 10/03/2017  . Bilateral primary osteoarthritis of knee 10/03/2017  . 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 Malignant neoplasm of upper-inner quadrant of left breast in female, estrogen receptor positive ([place]) 05/22/2017  . Hirsutism 11/24/2015  . Hyperlipidemia 02/01/2014  . Impaired glucose tolerance 02/01/2014  . Vitamin D deficiency 02/01/2014  . Family history of diabetes mellitus 01/31/2014  . Morbid obesity with BMI of 40.0-44.9, adult ([place]) 01/31/2014  . Sjogren's syndrome ([place]) 01/17/2014     Jerl Mina ,PT, DPT, E-RYT  08/16/2019, 4:31 PM  [place] MAIN [facility] SERVICES [address] [place], [place], [address] Phone: [phone]   Fax:  [phone]  Name: Sheena Martinez MRN: [id number] Date of Birth: [birth year]

## 2019-08-13 DIAGNOSIS — G4733 Obstructive sleep apnea (adult) (pediatric): Secondary | ICD-10-CM | POA: Diagnosis not present

## 2019-08-16 ENCOUNTER — Ambulatory Visit
Admission: RE | Admit: 2019-08-16 | Discharge: 2019-08-16 | Disposition: A | Discharge status: Home / Self Care | Payer: Medicaid Other | Source: Ambulatory Visit | Attending: Internal Medicine | Admitting: Internal Medicine

## 2019-08-16 ENCOUNTER — Other Ambulatory Visit: Payer: Self-pay

## 2019-08-16 DIAGNOSIS — E0520 Thyrotoxicosis with toxic multinodular goiter without thyrotoxic crisis or storm: Secondary | ICD-10-CM | POA: Insufficient documentation

## 2019-08-16 MED ORDER — SODIUM IODIDE I-123 7.4 MBQ CAPS
410.0000 | ORAL_CAPSULE | Freq: Once | ORAL | Status: AC
  Administered 2019-08-16: 10:00:00 410 via ORAL

## 2019-08-17 ENCOUNTER — Ambulatory Visit
Admission: RE | Admit: 2019-08-17 | Discharge: 2019-08-17 | Disposition: A | Discharge status: Home / Self Care | Payer: Medicaid Other | Source: Ambulatory Visit | Attending: Internal Medicine | Admitting: Internal Medicine

## 2019-08-17 ENCOUNTER — Ambulatory Visit: Payer: Medicaid Other | Attending: Family Medicine | Admitting: Physical Therapy

## 2019-08-17 DIAGNOSIS — M62838 Other muscle spasm: Secondary | ICD-10-CM | POA: Insufficient documentation

## 2019-08-17 DIAGNOSIS — R293 Abnormal posture: Secondary | ICD-10-CM

## 2019-08-17 DIAGNOSIS — E042 Nontoxic multinodular goiter: Secondary | ICD-10-CM | POA: Diagnosis not present

## 2019-08-17 NOTE — Therapy (Signed)
[place] MAIN [facility] SERVICES [address] [place], [place], [address] Phone: [phone]   Fax:  [phone]  Physical Therapy Treatment  Patient Details  Name: Sheena Martinez MRN: [id number] Date of Birth: 20-Sep-1960 Referring Provider (PT): Winter    Encounter Date: 08/17/2019  PT End of Session - 08/17/19 1500    Visit Number  3    Number of Visits  10    Date for PT Re-Evaluation  10/12/19    Authorization Type  reporting from 08/02/19    Authorization - Visit Number  3    Authorization - Number of Visits  10    PT Start Time  1400    PT Stop Time  1458    PT Time Calculation (min)  58 min    Activity Tolerance  Patient tolerated treatment well    Behavior During Therapy  [facility] for tasks assessed/performed       Past Medical History:  Diagnosis Date  . Anxiety   . Arthritis   . 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 GERD (gastroesophageal reflux disease)    OCC  . Goiter   . Hematuria 10/21/2018  . Hyperthyroidism   . Personal history of radiation therapy   . Sjogren's syndrome ([place])   . Uses continuous positive airway pressure (CPAP) ventilation at home 04/30/2019    Past Surgical History:  Procedure Laterality Date  . BREAST BIOPSY Left 05/12/2017   [place] bx/ INVASIVE MAMMARY CARCINOMA  . BREAST LUMPECTOMY Left 05/2017   invasive mammary carcinoma, neg margins  . BREAST LUMPECTOMY WITH SENTINEL LYMPH NODE BIOPSY Left 06/10/2017   Procedure: BREAST LUMPECTOMY WITH SENTINEL LYMPH NODE BX AND NEEDLE LOCALIZATION;  Surgeon: Robert Bellow, MD;  Location: [facility] ORS;  Service: General;  Laterality: Left;  . BREAST MAMMOSITE Left 06/24/2017   Procedure: MAMMOSITE BREAST;  Surgeon: Robert Bellow, MD;  Location: [facility] ORS;  Service: General;   Laterality: Left;  . CESAREAN SECTION  1995  . 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 COSMETIC SURGERY    . CYST EXCISION  2018   pilar cyst/ Dr Will Bonnet  . EYE SURGERY    . PORTACATH PLACEMENT Right 07/08/2017   Procedure: INSERTION PORT-A-CATH;  Surgeon: Robert Bellow, MD;  Location: [facility] ORS;  Service: General;  Laterality: Right;  . TONSILLECTOMY      There were no vitals filed for this visit.  Subjective Assessment - 08/17/19 1412    Subjective  Pt has not done her exercises this week again. Pt asked about wands and other gadgets that she looked up on [employer] for a quick fix for her pain relief. Pt has prioritized healthier eating because she was feeling miserable and decided she has to do what she is in control of which is changing her diet. Pt realizes she is drained after caring for all the rest of her family and then does not want to do anything like her exercises.  Pt has the space for her exercises. Pt has lots of medical appointments. Pt continues being consistent with doing the body scan at night every night. Pt has been consistent with using the CPAP machine. Pt know she needs to do her exercises. Pt felt good after last session. One day this week she was tempted  to take a pain medication but she took a Tylenol instead.  Today pt feels pain inthe pelvic area, low back pain is better since last session.    Pertinent History  Pt's vision is still blurry. Current stressor with family member moving back in with her and husband and grandson indefinitely. Pt has cut out junk food the past 3 months. Pt used intermittent fasting and stopped eatting after 6pm.  Pt was having constipation as a side effect from another medication      . Pt has since been taking Miralax and she no longer strains and is having more regular bowel movements.         [facility] PT Assessment - 08/17/19 1435      Palpation   Spinal mobility  no tightness at paraspional/ moderate tightness at T/L junction                 Pelvic Floor Special Questions - 08/17/19 1425    Skin Integrity  Other    Skin Integrity other  sensation to Q-tip end beween sharp and dull - bilaterally intact     External Palpation  undergarment doffed, no tenderness nor tightness noted at pelvic floor B                   PT Short Term Goals - 05/18/19 1554      PT SHORT TERM GOAL #1   Title  Be independent with initial home exercise program for self-management of symptoms.    Baseline  initial HEP provided at IE (05/04/2019);    Time  2    Period  Weeks    Status  Achieved    Target Date  05/18/19        PT Long Term Goals - 08/16/19 1627      PT LONG TERM GOAL #1   Title  Pt demo IND and compliance with HEP across 31month , atleast 3 x week in order to minimize risk for relapse    Baseline  not compliant with previous PT exercises    Time  10    Period  Weeks    Status  On-going      PT LONG TERM GOAL #2   Title  Pt will demo decreased L pelvic floor mm tightness across 2 sessions in order to restore pelvic floor function    Baseline  increased mm tightness on L pelvic floor  ( 08/12/19: no tightness )    Time  4    Period  Weeks    Status  Achieved      PT LONG TERM GOAL #3   Title  Pt will demo proper pelvic floor lengthening and coordination and report decreased crossing of legs when sitting in order to maintain pelvic floor pliability for better function    Baseline  8    Time  8    Period  Weeks    Status  On-going      PT LONG TERM GOAL #4   Title  Pt will demo bilateral distinction of sensation between sharp and dull and sensation at L transverse perineal mm across 2 visits in order to restore neural mobility and decrease pain    Baseline  decreased distinction of sensation between sharp and dull and sensation at L transverse perineal mm    Time  2    Period  Weeks    Status  On-going            Plan -  08/17/19 1500    Clinical Impression Statement  Pt required  motivational interviewing to build HEP compliance. Discussed a strategy that pt agreed with. Pelvic pain was not related to tightness muscles as pt demo'd no tightness/ tenderness of pelvic floor and also her sensation of dull/ sharp at perineal area was restored bilaterally. Low back mm also was decreased compared to last session.    Discussed tamoxifen sideeffects on pelvic tissue which pt has been taking for the past 2 years. Recommended Replens as a moisturizer that is estrogen-free for moisturizing perineal area. Also discussed compliance to HEP as it helped decrease pelvic pain last year with pelvic PT. Pt expressed stressors at home and recognizes she needs to make time for self-care and not feel drained caring for the rest of her family.   Pt continues to benefit from skilled PT and plan to perform other movement exercises for gentle movements and stress relief. Provide her pain science education next session.      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, anxiety, hyperthyroidism    Examination-Activity Limitations  Sleep;Sit;Stand;Caring for Others;Squat    Examination-Participation Restrictions  Community Activity;Shop;Other   sleeping on her bed   Stability/Clinical Decision Making  Evolving/Moderate complexity    Rehab Potential  Good    PT Frequency  1x / week    PT Duration  Other (comment)   10   PT Treatment/Interventions  Joint Manipulations;ADLs/Self Care Home Management;Cryotherapy;Therapeutic activities;Functional mobility training;Therapeutic exercise;Balance training;Neuromuscular re-education;Patient/family education;Manual techniques;Passive range of motion;Dry needling;Other (comment);Moist Heat;Electrical Stimulation;Gait training;Scar mobilization;Manual lymph drainage   joint  mobilization grades I-V   Consulted and Agree with Plan of Care  Patient       Patient will benefit from skilled therapeutic intervention in order to improve the following deficits and impairments:  Decreased balance, Decreased endurance, Abnormal gait, Increased muscle spasms, Decreased activity tolerance, Decreased strength, Pain, Improper body mechanics, Difficulty walking, Obesity, Impaired perceived functional ability, Impaired flexibility  Visit Diagnosis: Other muscle spasm  Abnormal posture     Problem List Patient Active Problem List   Diagnosis Date Noted  . Osteopenia 03/18/2019  . Facial flushing 10/21/2018  . Tinnitus of both ears 10/21/2018  . Drug-induced neutropenia ([place]) 10/20/2018  . 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 Thrombocytosis ([place]) 10/20/2018  . 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 Irregular menstrual cycle 03/02/2018  . Screening for cervical cancer 03/02/2018  . Long-term current use of tamoxifen 02/17/2018  . 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 Anxiety 10/03/2017  . Bilateral primary osteoarthritis of knee 10/03/2017  . 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 Malignant neoplasm of upper-inner quadrant of left breast in female, estrogen receptor positive ([place]) 05/22/2017  . Hirsutism 11/24/2015  . Hyperlipidemia 02/01/2014  . Impaired glucose tolerance 02/01/2014  . Vitamin D deficiency 02/01/2014  . Family history of diabetes mellitus 01/31/2014  . Morbid obesity with BMI of 40.0-44.9, adult ([place]) 01/31/2014  . Sjogren's syndrome ([place]) 01/17/2014    Jerl Mina ,PT, DPT, E-RYT  08/17/2019, 3:01 PM  [place] MAIN [facility] SERVICES [address] [place], [place], [address] Phone: [phone]   Fax:  [phone]  Name: BAELI BRINKMEIER MRN: [id number] Date of Birth: [birth year]

## 2019-08-17 NOTE — Patient Instructions (Signed)
Schedule a specific time to show up in your bedroom for yourself to perform exercises  Body scan in the morning   Moisturizers .             They are used in the vagina to hydrate the mucous membrane that make up the vaginal canal. .             Designed to keep a more normal acid balance (ph) .             Once placed in the vagina, it will last between two to three days. .             Use 2-3 times per week at bedtime and last longer than 60 min. .             Ingredients to avoid is glycerin and fragrance, can increase chance of infection .             Should not be used just before sex due to causing irritation .             Most are gels administered either in a tampon-shaped applicator or as a vaginal suppository.                They  are non-hormonal.     Types of Moisturizers  .             Do not use Julva- (Do no use if on Tamoxifen)  .            http://www.replens.com/Products/Replens-Long-Lasting-Moisturizer/      Things to avoid in the vaginal area .             Do not use things to irritate the vulvar area .             No lotions- see below .             No soaps; can use Aveeno or Calendula cleanser if needed. Must be gentle .             No deodorants .             No douches .             Good to sleep without underwear to let the vaginal area to air out .             No scrubbing: spread the lips to let warm water rinse over labias and pat dry

## 2019-08-18 DIAGNOSIS — L821 Other seborrheic keratosis: Secondary | ICD-10-CM | POA: Diagnosis not present

## 2019-08-18 DIAGNOSIS — L219 Seborrheic dermatitis, unspecified: Secondary | ICD-10-CM | POA: Diagnosis not present

## 2019-08-18 DIAGNOSIS — R21 Rash and other nonspecific skin eruption: Secondary | ICD-10-CM | POA: Diagnosis not present

## 2019-08-18 DIAGNOSIS — D1724 Benign lipomatous neoplasm of skin and subcutaneous tissue of left leg: Secondary | ICD-10-CM | POA: Diagnosis not present

## 2019-08-20 ENCOUNTER — Ambulatory Visit
Admission: RE | Admit: 2019-08-20 | Discharge: 2019-08-20 | Disposition: A | Discharge status: Home / Self Care | Payer: Medicaid Other | Source: Ambulatory Visit | Attending: Internal Medicine | Admitting: Internal Medicine

## 2019-08-20 ENCOUNTER — Other Ambulatory Visit: Payer: Self-pay

## 2019-08-20 DIAGNOSIS — E0520 Thyrotoxicosis with toxic multinodular goiter without thyrotoxic crisis or storm: Secondary | ICD-10-CM

## 2019-08-20 DIAGNOSIS — E0590 Thyrotoxicosis, unspecified without thyrotoxic crisis or storm: Secondary | ICD-10-CM | POA: Diagnosis not present

## 2019-08-20 MED ORDER — SODIUM IODIDE I 131 CAPSULE
25.8690 | Freq: Once | INTRAVENOUS | Status: AC | PRN
  Administered 2019-08-20: 10:00:00 25.869 via ORAL

## 2019-08-24 ENCOUNTER — Ambulatory Visit: Payer: Medicaid Other | Admitting: Physical Therapy

## 2019-08-24 ENCOUNTER — Other Ambulatory Visit: Payer: Self-pay

## 2019-08-24 DIAGNOSIS — R293 Abnormal posture: Secondary | ICD-10-CM

## 2019-08-24 DIAGNOSIS — M62838 Other muscle spasm: Secondary | ICD-10-CM | POA: Diagnosis not present

## 2019-08-25 DIAGNOSIS — H169 Unspecified keratitis: Secondary | ICD-10-CM | POA: Diagnosis not present

## 2019-08-25 NOTE — Therapy (Signed)
[place] MAIN [place] SERVICES [address] [place], [place], [address] Phone: [phone]   Fax:  [phone]  Physical Therapy Treatment / Discharge Summary   Patient Details  Name: Sheena Martinez MRN: [id number] Date of Birth: 19-Dec-1960 Referring Provider (PT): Winter    Encounter Date: 08/24/2019  PT End of Session - 08/24/19 1436    Visit Number  4    Number of Visits  10    Date for PT Re-Evaluation  10/12/19    Authorization Type  reporting from 08/02/19    Authorization - Visit Number  4    Authorization - Number of Visits  10    PT Start Time  3716    PT Stop Time  1530    PT Time Calculation (min)  58 min    Activity Tolerance  Patient tolerated treatment well    Behavior During Therapy  [facility] for tasks assessed/performed       Past Medical History:  Diagnosis Date  . Anxiety   . Arthritis   . 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 Corneal perforation of left eye 10/09/2017   Overview:  Added automatically from request for surgery [id number]  . GERD (gastroesophageal reflux disease)    OCC  . Goiter   . Hematuria 10/21/2018  . Hyperthyroidism   . Personal history of radiation therapy   . Sjogren's syndrome ([place])   . Uses continuous positive airway pressure (CPAP) ventilation at home 04/30/2019    Past Surgical History:  Procedure Laterality Date  . BREAST BIOPSY Left 05/12/2017   [place] bx/ INVASIVE MAMMARY CARCINOMA  . BREAST LUMPECTOMY Left 05/2017   invasive mammary carcinoma, neg margins  . BREAST LUMPECTOMY WITH SENTINEL LYMPH NODE BIOPSY Left 06/10/2017   Procedure: BREAST LUMPECTOMY WITH SENTINEL LYMPH NODE BX AND NEEDLE LOCALIZATION;  Surgeon: Robert Bellow, MD;  Location: [facility] ORS;  Service: General;  Laterality: Left;  . BREAST MAMMOSITE Left 06/24/2017   Procedure: MAMMOSITE BREAST;  Surgeon: Robert Bellow, MD;  Location: [facility] ORS;   Service: General;  Laterality: Left;  . CESAREAN SECTION  1995  . 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 COSMETIC SURGERY    . CYST EXCISION  2018   pilar cyst/ Dr Will Bonnet  . EYE SURGERY    . PORTACATH PLACEMENT Right 07/08/2017   Procedure: INSERTION PORT-A-CATH;  Surgeon: Robert Bellow, MD;  Location: [facility] ORS;  Service: General;  Laterality: Right;  . TONSILLECTOMY      There were no vitals filed for this visit.  Subjective Assessment - 08/24/19 1437    Subjective  Pt reported she self-quarantined in her bedroom and spent time in her recliner for the past 4 days because she had to get the thyroid treatment. Pt noticed her pelvic pain was less during these days and she did not feel as stressed. Pt noticed when she left her bedroom after this quarantine time and had to interact with her family, pt felt her body tighten and tense back up. Her back was burning. Her knees were hurting.    Pertinent History  Pt's vision is still blurry. Current stressor with family member moving back in with her and husband and grandson indefinitely. Pt has cut out junk food the past 3 months. Pt used intermittent fasting and stopped eatting after 6pm.  Pt was having constipation as a side effect from another medication      .  Pt has since been taking Miralax and she no longer strains and is having more regular bowel movements.         [facility] PT Assessment - 08/25/19 1551      Palpation   Palpation comment  increased tightenss at paraspinals                 Pelvic Floor Special Questions - 08/25/19 1551    External Palpation  undergarment doffed. no tightness/ tenderness of pelvic floor B         [facility] Adult PT Treatment/Exercise - 08/25/19 1549      Therapeutic Activites    ADL's  discussed compliance to HEP, creating time in her room to care for self with stretches and minimize stress       Exercises   Exercises  --   reviewed stretches to minimize lumbar tightness     Manual  Therapy   Manual therapy comments  STM/MWM at parspinals B                PT Short Term Goals - 05/18/19 1554      PT SHORT TERM GOAL #1   Title  Be independent with initial home exercise program for self-management of symptoms.    Baseline  initial HEP provided at IE (05/04/2019);    Time  2    Period  Weeks    Status  Achieved    Target Date  05/18/19        PT Long Term Goals - 08/24/19 1440      PT LONG TERM GOAL #1   Title  Pt demo IND and compliance with HEP across 81month, atleast 3 x week in order to minimize risk for relapse    Baseline  not compliant with previous PT exercises    Time  10    Period  Weeks    Status  Not met     PT LONG TERM GOAL #2   Title  Pt will demo decreased L pelvic floor mm tightness across 2 sessions in order to restore pelvic floor function    Baseline  increased mm tightness on L pelvic floor  ( 08/12/19: no tightness )    Time  4    Period  Weeks    Status  Achieved      PT LONG TERM GOAL #3   Title  Pt will demo proper pelvic floor lengthening and coordination and report decreased crossing of legs when sitting in order to maintain pelvic floor pliability for better function    Baseline  8    Time  8    Period  Weeks    Status Achieved      PT LONG TERM GOAL #4   Title  Pt will demo bilateral distinction of sensation between sharp and dull and sensation at L transverse perineal mm across 2 visits in order to restore neural mobility and decrease pain    Baseline  decreased distinction of sensation between sharp and dull and sensation at L transverse perineal mm    Time  2    Period  Weeks    Status  Achieved            Plan - 08/24/19 1436    Clinical Impression Statement  Pt has achieved 3/4 goals with increased perineal sensation, decreased L pelvic floor tightness, and less lumbar tightness.   Pt reports she feels her Sx have improved " Quite a GHuman resources officer based  on the [facility] scale.   LBP and pelvic pain  is related to stress and tightness of muscles. Across  every visit, pt has been provided motivational interviewing and strategies for compliance to stretches and flexibility routine and self-care practices to manage family stress. Pt voiced she understands the importance of being compliance to HEP and self-care and plans to continue working on a routine which she was consistent with in the past. Pt is ready for d/c at this time.    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, anxiety, hyperthyroidism    Examination-Activity Limitations  Sleep;Sit;Stand;Caring for Others;Squat    Examination-Participation Restrictions  Community Activity;Shop;Other   sleeping on her bed   Stability/Clinical Decision Making  Evolving/Moderate complexity    Rehab Potential  Good    PT Frequency  1x / week    PT Duration  Other (comment)   10   PT Treatment/Interventions  Joint Manipulations;ADLs/Self Care Home Management;Cryotherapy;Therapeutic activities;Functional mobility training;Therapeutic exercise;Balance training;Neuromuscular re-education;Patient/family education;Manual techniques;Passive range of motion;Dry needling;Other (comment);Moist Heat;Electrical Stimulation;Gait training;Scar mobilization;Manual lymph drainage   joint mobilization grades I-V   Consulted and Agree with Plan of Care  Patient       Patient will benefit from skilled therapeutic intervention in order to improve the following deficits and impairments:  Decreased balance, Decreased endurance, Abnormal gait, Increased muscle spasms, Decreased activity tolerance, Decreased strength, Pain, Improper body mechanics, Difficulty walking, Obesity, Impaired perceived functional ability, Impaired flexibility  Visit Diagnosis: Abnormal posture  Other muscle  spasm     Problem List Patient Active Problem List   Diagnosis Date Noted  . Osteopenia 03/18/2019  . Facial flushing 10/21/2018  . Tinnitus of both ears 10/21/2018  . Drug-induced neutropenia ([place]) 10/20/2018  . 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 Thrombocytosis ([place]) 10/20/2018  . 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 Irregular menstrual cycle 03/02/2018  . Screening for cervical cancer 03/02/2018  . Long-term current use of tamoxifen 02/17/2018  . 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 Anxiety 10/03/2017  . Bilateral primary osteoarthritis of knee 10/03/2017  . 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 Malignant neoplasm of upper-inner quadrant of left breast in female, estrogen receptor positive ([place]) 05/22/2017  . Hirsutism 11/24/2015  . Hyperlipidemia 02/01/2014  . Impaired glucose tolerance 02/01/2014  . Vitamin D deficiency 02/01/2014  . Family history of diabetes mellitus 01/31/2014  . Morbid obesity with BMI of 40.0-44.9, adult ([place]) 01/31/2014  . Sjogren's syndrome ([place]) 01/17/2014    Jerl Mina ,PT, DPT, E-RYT  08/25/2019, 3:53 PM  [place] MAIN [facility] SERVICES [address] [place], [place], [address] Phone: [phone]   Fax:  [phone]  Name: Sheena Martinez MRN: [id number] Date of Birth: Dec 26, 1960

## 2019-08-26 DIAGNOSIS — M170 Bilateral primary osteoarthritis of knee: Secondary | ICD-10-CM | POA: Diagnosis not present

## 2019-08-26 DIAGNOSIS — H16002 Unspecified corneal ulcer, left eye: Secondary | ICD-10-CM | POA: Diagnosis not present

## 2019-08-26 DIAGNOSIS — M3500 Sicca syndrome, unspecified: Secondary | ICD-10-CM | POA: Diagnosis not present

## 2019-08-31 ENCOUNTER — Encounter: Payer: Medicaid Other | Admitting: Physical Therapy

## 2019-08-31 DIAGNOSIS — H40003 Preglaucoma, unspecified, bilateral: Secondary | ICD-10-CM | POA: Diagnosis not present

## 2019-08-31 DIAGNOSIS — H109 Unspecified conjunctivitis: Secondary | ICD-10-CM | POA: Diagnosis not present

## 2019-08-31 DIAGNOSIS — H2513 Age-related nuclear cataract, bilateral: Secondary | ICD-10-CM | POA: Diagnosis not present

## 2019-08-31 DIAGNOSIS — H168 Other keratitis: Secondary | ICD-10-CM | POA: Diagnosis not present

## 2019-08-31 DIAGNOSIS — H5789 Other specified disorders of eye and adnexa: Secondary | ICD-10-CM | POA: Diagnosis not present

## 2019-09-01 ENCOUNTER — Encounter: Payer: Medicaid Other | Admitting: Physical Therapy

## 2019-09-01 ENCOUNTER — Inpatient Hospital Stay: Payer: Medicaid Other

## 2019-09-07 ENCOUNTER — Encounter: Payer: Medicaid Other | Admitting: Physical Therapy

## 2019-09-07 DIAGNOSIS — M170 Bilateral primary osteoarthritis of knee: Secondary | ICD-10-CM | POA: Diagnosis not present

## 2019-09-08 ENCOUNTER — Inpatient Hospital Stay: Payer: Medicaid Other

## 2019-09-13 DIAGNOSIS — G4733 Obstructive sleep apnea (adult) (pediatric): Secondary | ICD-10-CM | POA: Diagnosis not present

## 2019-09-14 ENCOUNTER — Encounter: Payer: Medicaid Other | Admitting: Physical Therapy

## 2019-09-14 DIAGNOSIS — G4700 Insomnia, unspecified: Secondary | ICD-10-CM | POA: Diagnosis not present

## 2019-09-14 DIAGNOSIS — F410 Panic disorder [episodic paroxysmal anxiety] without agoraphobia: Secondary | ICD-10-CM | POA: Diagnosis not present

## 2019-09-14 DIAGNOSIS — F411 Generalized anxiety disorder: Secondary | ICD-10-CM | POA: Diagnosis not present

## 2019-09-14 DIAGNOSIS — H9319 Tinnitus, unspecified ear: Secondary | ICD-10-CM | POA: Diagnosis not present

## 2019-09-16 ENCOUNTER — Other Ambulatory Visit: Payer: Self-pay | Admitting: Oncology

## 2019-09-24 DIAGNOSIS — H2513 Age-related nuclear cataract, bilateral: Secondary | ICD-10-CM | POA: Diagnosis not present

## 2019-09-24 DIAGNOSIS — H40003 Preglaucoma, unspecified, bilateral: Secondary | ICD-10-CM | POA: Diagnosis not present

## 2019-09-24 DIAGNOSIS — H01006 Unspecified blepharitis left eye, unspecified eyelid: Secondary | ICD-10-CM | POA: Diagnosis not present

## 2019-09-27 DIAGNOSIS — H0101A Ulcerative blepharitis right eye, upper and lower eyelids: Secondary | ICD-10-CM | POA: Diagnosis not present

## 2019-09-27 DIAGNOSIS — H0101B Ulcerative blepharitis left eye, upper and lower eyelids: Secondary | ICD-10-CM | POA: Diagnosis not present

## 2019-10-04 DIAGNOSIS — E0520 Thyrotoxicosis with toxic multinodular goiter without thyrotoxic crisis or storm: Secondary | ICD-10-CM | POA: Diagnosis not present

## 2019-10-08 DIAGNOSIS — E0520 Thyrotoxicosis with toxic multinodular goiter without thyrotoxic crisis or storm: Secondary | ICD-10-CM | POA: Diagnosis not present

## 2019-10-08 DIAGNOSIS — L680 Hirsutism: Secondary | ICD-10-CM | POA: Diagnosis not present

## 2019-10-08 DIAGNOSIS — N950 Postmenopausal bleeding: Secondary | ICD-10-CM | POA: Diagnosis not present

## 2019-10-11 DIAGNOSIS — G4733 Obstructive sleep apnea (adult) (pediatric): Secondary | ICD-10-CM | POA: Diagnosis not present

## 2019-10-25 ENCOUNTER — Other Ambulatory Visit: Payer: Self-pay

## 2019-10-25 ENCOUNTER — Ambulatory Visit (INDEPENDENT_AMBULATORY_CARE_PROVIDER_SITE_OTHER): Payer: Medicaid Other | Admitting: Obstetrics & Gynecology

## 2019-10-25 ENCOUNTER — Encounter: Payer: Self-pay | Admitting: Obstetrics & Gynecology

## 2019-10-25 ENCOUNTER — Other Ambulatory Visit (HOSPITAL_COMMUNITY)
Admission: RE | Admit: 2019-10-25 | Discharge: 2019-10-25 | Disposition: A | Discharge status: Home / Self Care | Payer: Medicaid Other | Source: Ambulatory Visit | Attending: Obstetrics & Gynecology | Admitting: Obstetrics & Gynecology

## 2019-10-25 VITALS — BP 122/80 | Wt 220.0 lb

## 2019-10-25 DIAGNOSIS — N840 Polyp of corpus uteri: Secondary | ICD-10-CM | POA: Diagnosis not present

## 2019-10-25 DIAGNOSIS — Z79810 Long term (current) use of selective estrogen receptor modulators (SERMs): Secondary | ICD-10-CM | POA: Diagnosis not present

## 2019-10-25 DIAGNOSIS — Z853 Personal history of malignant neoplasm of breast: Secondary | ICD-10-CM

## 2019-10-25 DIAGNOSIS — N950 Postmenopausal bleeding: Secondary | ICD-10-CM | POA: Insufficient documentation

## 2019-10-25 DIAGNOSIS — N941 Unspecified dyspareunia: Secondary | ICD-10-CM

## 2019-10-25 NOTE — Patient Instructions (Addendum)
Replens for vag dryness and pain   Endometrial Biopsy, Care After This sheet gives you information about how to care for yourself after your procedure. Your health care provider may also give you more specific instructions. If you have problems or questions, contact your health care provider. What can I expect after the procedure? After the procedure, it is common to have:  Mild cramping.  A small amount of vaginal bleeding for a few days. This is normal. Follow these instructions at home:   Take over-the-counter and prescription medicines only as told by your health care provider.  Do not douche, use tampons, or have sexual intercourse until your health care provider approves.  Return to your normal activities as told by your health care provider. Ask your health care provider what activities are safe for you.  Follow instructions from your health care provider about any activity restrictions, such as restrictions on strenuous exercise or heavy lifting. Contact a health care provider if:  You have heavy bleeding, or bleed for longer than 2 days after the procedure.  You have bad smelling discharge from your vagina.  You have a fever or chills.  You have a burning sensation when urinating or you have difficulty urinating.  You have severe pain in your lower abdomen. Get help right away if:  You have severe cramps in your stomach or back.  You pass large blood clots.  Your bleeding increases.  You become weak or light-headed, or you pass out. Summary  After the procedure, it is common to have mild cramping and a small amount of vaginal bleeding for a few days.  Do not douche, use tampons, or have sexual intercourse until your health care provider approves.  Return to your normal activities as told by your health care provider. Ask your health care provider what activities are safe for you. This information is not intended to replace advice given to you by your health  care provider. Make sure you discuss any questions you have with your health care provider. Document Revised: 07/11/2017 Document Reviewed: 08/14/2016 Elsevier Patient Education  [place].

## 2019-10-25 NOTE — Progress Notes (Signed)
Postmenopausal Bleeding Patient is a 59 yo G3P3 WF who complains of vaginal bleeding. She has been menopausal for a few years. Currently on no HRT but is taking Tamoxifen for breast cancer and has been on this regimen for 3 years. Also, she in Jan stopped Methimaxole and took one dose Iodide for Goitre, with levels being monitored.  Bleeding started Feb 12 for 2 weeks.  Bleeding is described as flow about like a period and has occurred 1 times. Other menopausal symptoms include: vasomotor symptoms began a few years ago, occur several times per day, and last about briefly (hot flashes mostly); no other sx's.. Workup to date: none.  Prior PAP and EMB 2019 normal.  Menstrual History: OB History    Gravida  3   Para  3   Term  3   Preterm      AB      Living  3     SAB      TAB      Ectopic      Multiple      Live Births           Obstetric Comments  1st Menstrual Cycle:  10  1st Pregnancy:  21        Patient's last menstrual period was 01/05/2018. Period Pattern: (!) Irregular Menstrual Flow: Light  PMHx: She  has a past medical history of Anxiety, Arthritis, Breast cancer ([place]) (05/12/2017), Corneal perforation of left eye (10/09/2017), GERD (gastroesophageal reflux disease), Goiter, Hematuria (10/21/2018), Hyperthyroidism, Personal history of radiation therapy, Sjogren's syndrome ([place]), and Uses continuous positive airway pressure (CPAP) ventilation at home (04/30/2019). Also,  has a past surgical history that includes Cesarean section (1995); Cyst excision (2018); Tonsillectomy; Breast lumpectomy with sentinel lymph node bx (Left, 06/10/2017); Cosmetic surgery; Breast Mammosite (Left, 06/24/2017); Portacath placement (Right, 07/08/2017); Eye surgery; Breast lumpectomy (Left, 05/2017); Corneal transplant (11/2017); and Breast biopsy (Left, 05/12/2017)., family history includes Anemia in her mother; Breast cancer (age of onset: 50) in her maternal aunt; Cancer in her maternal  uncle; Diabetes in her mother; Leukemia (age of onset: 24) in her cousin; Liver disease in her father; Lung cancer in her paternal aunt; Ovarian cancer (age of onset: 77) in her maternal grandmother; Skin cancer in her mother; Stomach cancer (age of onset: 39) in her paternal uncle; Stomach cancer (age of onset: 5) in her paternal grandfather.,  reports that she quit smoking about 40 years ago. Her smoking use included cigarettes. She quit after 7.00 years of use. She has never used smokeless tobacco. She reports that she does not drink alcohol or use drugs.  She has a current medication list which includes the following prescription(s): acetaminophen, brimonidine, ca carbonate-mag hydroxide, cholecalciferol, ketoconazole, lorazepam, meloxicam, methimazole, tamoxifen, timolol maleate, tizanidine, venlafaxine xr, vesicare, and vitamin k. Also, is allergic to rituximab; benadryl [diphenhydramine]; cortisone; diphenhydramine hcl; clonazepam; and sulfamethoxazole-trimethoprim.  Review of Systems  Constitutional: Negative for chills, fever and malaise/fatigue.  HENT: Negative for congestion, sinus pain and sore throat.   Eyes: Negative for blurred vision and pain.  Respiratory: Negative for cough and wheezing.   Cardiovascular: Negative for chest pain and leg swelling.  Gastrointestinal: Negative for abdominal pain, constipation, diarrhea, heartburn, nausea and vomiting.  Genitourinary: Negative for dysuria, frequency, hematuria and urgency.  Musculoskeletal: Negative for back pain, joint pain, myalgias and neck pain.  Skin: Negative for itching and rash.  Neurological: Negative for dizziness, tremors and weakness.  Endo/Heme/Allergies: Does not bruise/bleed easily.  Psychiatric/Behavioral: Negative for depression.  The patient is not nervous/anxious and does not have insomnia.     Objective: BP 122/80   Wt 220 lb (99.8 kg)   LMP 01/05/2018   BMI 36.61 kg/m  Physical Exam Constitutional:       General: She is not in acute distress.    Appearance: She is well-developed.  Genitourinary:     Pelvic exam was performed with patient supine.     Vagina and uterus normal.     No vaginal erythema or bleeding.     No cervical motion tenderness, discharge, polyp or nabothian cyst.     Uterus is mobile.     Uterus is not enlarged.     No uterine mass detected.    Uterus is midaxial.     No right or left adnexal mass present.     Right adnexa not tender.     Left adnexa not tender.  HENT:     Head: Normocephalic and atraumatic.     Nose: Nose normal.  Abdominal:     General: There is no distension.     Palpations: Abdomen is soft.     Tenderness: There is no abdominal tenderness.  Musculoskeletal:        General: Normal range of motion.  Neurological:     Mental Status: She is alert and oriented to person, place, and time.     Cranial Nerves: No cranial nerve deficit.  Skin:    General: Skin is warm and dry.  Psychiatric:        Attention and Perception: Attention normal.        Mood and Affect: Mood and affect normal.        Speech: Speech normal.        Behavior: Behavior normal.        Thought Content: Thought content normal.        Judgment: Judgment normal.     ASSESSMENT/PLAN:    Problem List Items Addressed This Visit      Other   Post-menopausal bleeding - Primary    Likely due to thyroid meds changes EMB to address uterine cancer concerns, especially as she is on Tamoxifen  (see below)  Also, Replens for vag drynes (she reports dyspareunia).  Other options not recommended due to Tamoxifen and breast cancer risks (ERT vaginal therapy, [place], [place])  Endometrial Biopsy After discussion with the patient regarding her abnormal uterine bleeding I recommended that she proceed with an endometrial biopsy for further diagnosis. The risks, benefits, alternatives, and indications for an endometrial biopsy were discussed with the patient in detail. She understood  the risks including infection, bleeding, cervical laceration and uterine perforation.  Verbal consent was obtained.   PROCEDURE NOTE:  [place] endometrial biopsy was performed using aseptic technique with iodine preparation.  The uterus was sounded to a length of 7 cm.  Adequate sampling was obtained with minimal blood loss.  The patient tolerated the procedure well.  Disposition will be pending pathology.  A total of 35 minutes were spent face-to-face with the patient as well as preparation, review, communication, and documentation during this encounter.   Barnett Applebaum, MD, [facility], [facility] 10/25/2019  8:08 AM

## 2019-10-27 ENCOUNTER — Other Ambulatory Visit: Payer: Self-pay | Admitting: Obstetrics & Gynecology

## 2019-10-27 LAB — SURGICAL PATHOLOGY

## 2019-10-27 NOTE — Progress Notes (Signed)
EMB results reviewed, no hyperplasia or cancer. Suggestion of polyp. D/w pt Pan to monitor bleeding frequency for now.  Also allow thyroid management to normalize as bleeding may be triggered from those changes. If persists, consider [place] and possible hysteroscopy polypectomy  Barnett Applebaum, MD, [place], [facility] 10/27/2019  9:20 AM

## 2019-11-09 DIAGNOSIS — F411 Generalized anxiety disorder: Secondary | ICD-10-CM | POA: Diagnosis not present

## 2019-11-11 DIAGNOSIS — G4733 Obstructive sleep apnea (adult) (pediatric): Secondary | ICD-10-CM | POA: Diagnosis not present

## 2019-11-17 DIAGNOSIS — E0520 Thyrotoxicosis with toxic multinodular goiter without thyrotoxic crisis or storm: Secondary | ICD-10-CM | POA: Diagnosis not present

## 2019-11-24 DIAGNOSIS — E0520 Thyrotoxicosis with toxic multinodular goiter without thyrotoxic crisis or storm: Secondary | ICD-10-CM | POA: Diagnosis not present

## 2019-12-07 DIAGNOSIS — F410 Panic disorder [episodic paroxysmal anxiety] without agoraphobia: Secondary | ICD-10-CM | POA: Diagnosis not present

## 2019-12-07 DIAGNOSIS — F411 Generalized anxiety disorder: Secondary | ICD-10-CM | POA: Diagnosis not present

## 2019-12-11 DIAGNOSIS — G4733 Obstructive sleep apnea (adult) (pediatric): Secondary | ICD-10-CM | POA: Diagnosis not present

## 2019-12-13 DIAGNOSIS — H25812 Combined forms of age-related cataract, left eye: Secondary | ICD-10-CM | POA: Diagnosis not present

## 2019-12-14 DIAGNOSIS — H269 Unspecified cataract: Secondary | ICD-10-CM | POA: Insufficient documentation

## 2019-12-27 ENCOUNTER — Other Ambulatory Visit: Payer: Self-pay | Admitting: Oncology

## 2019-12-27 ENCOUNTER — Telehealth: Payer: Self-pay

## 2019-12-27 NOTE — Telephone Encounter (Signed)
Done....  Pt 51mth MD F/U visit 1 week after las has been scheduled as requested per 08/09/19 los. Looks like a Marketing executive did sched pt to RTC in 6 mths for labs,  So, I just had to get pt scheduled for her 1 week after labs F/U MD visit. Per pt request to do a MyChart visit on 01/11/20. Appt has been sched as requested.

## 2019-12-27 NOTE — Telephone Encounter (Signed)
Please schedule follow up

## 2019-12-27 NOTE — Telephone Encounter (Signed)
Refill request received for Tamoxifen.  No f/u MD appt was scheduled.  She has been scheduled for lab on 6/28 virtual visit on 7/6.  Will send refill.

## 2020-01-07 ENCOUNTER — Other Ambulatory Visit: Payer: Medicaid Other

## 2020-01-08 DIAGNOSIS — Z01812 Encounter for preprocedural laboratory examination: Secondary | ICD-10-CM | POA: Diagnosis not present

## 2020-01-08 DIAGNOSIS — Z20822 Contact with and (suspected) exposure to covid-19: Secondary | ICD-10-CM | POA: Diagnosis not present

## 2020-01-11 DIAGNOSIS — G4733 Obstructive sleep apnea (adult) (pediatric): Secondary | ICD-10-CM | POA: Diagnosis not present

## 2020-01-11 DIAGNOSIS — H269 Unspecified cataract: Secondary | ICD-10-CM

## 2020-01-11 DIAGNOSIS — H2589 Other age-related cataract: Secondary | ICD-10-CM | POA: Diagnosis not present

## 2020-01-11 HISTORY — DX: Unspecified cataract: H26.9

## 2020-01-18 DIAGNOSIS — F410 Panic disorder [episodic paroxysmal anxiety] without agoraphobia: Secondary | ICD-10-CM | POA: Diagnosis not present

## 2020-01-18 DIAGNOSIS — F411 Generalized anxiety disorder: Secondary | ICD-10-CM | POA: Diagnosis not present

## 2020-02-07 ENCOUNTER — Other Ambulatory Visit: Payer: Self-pay

## 2020-02-07 ENCOUNTER — Inpatient Hospital Stay: Payer: Medicaid Other | Attending: Oncology

## 2020-02-07 DIAGNOSIS — F411 Generalized anxiety disorder: Secondary | ICD-10-CM | POA: Insufficient documentation

## 2020-02-07 DIAGNOSIS — H16002 Unspecified corneal ulcer, left eye: Secondary | ICD-10-CM

## 2020-02-07 DIAGNOSIS — Z95828 Presence of other vascular implants and grafts: Secondary | ICD-10-CM | POA: Insufficient documentation

## 2020-02-07 DIAGNOSIS — C50212 Malignant neoplasm of upper-inner quadrant of left female breast: Secondary | ICD-10-CM | POA: Insufficient documentation

## 2020-02-07 DIAGNOSIS — E0590 Thyrotoxicosis, unspecified without thyrotoxic crisis or storm: Secondary | ICD-10-CM

## 2020-02-07 DIAGNOSIS — Z170 Estrogen receptor positive status [ER+]: Secondary | ICD-10-CM | POA: Diagnosis not present

## 2020-02-07 LAB — CBC WITH DIFFERENTIAL/PLATELET
Abs Immature Granulocytes: 0.01 10*3/uL (ref 0.00–0.07)
Basophils Absolute: 0 10*3/uL (ref 0.0–0.1)
Basophils Relative: 0 %
Eosinophils Absolute: 0.1 10*3/uL (ref 0.0–0.5)
Eosinophils Relative: 2 %
HCT: 38.6 % (ref 36.0–46.0)
Hemoglobin: 12.9 g/dL (ref 12.0–15.0)
Immature Granulocytes: 0 %
Lymphocytes Relative: 34 %
Lymphs Abs: 2.1 10*3/uL (ref 0.7–4.0)
MCH: 31.3 pg (ref 26.0–34.0)
MCHC: 33.4 g/dL (ref 30.0–36.0)
MCV: 93.7 fL (ref 80.0–100.0)
Monocytes Absolute: 0.7 10*3/uL (ref 0.1–1.0)
Monocytes Relative: 11 %
Neutro Abs: 3.3 10*3/uL (ref 1.7–7.7)
Neutrophils Relative %: 53 %
Platelets: 285 10*3/uL (ref 150–400)
RBC: 4.12 MIL/uL (ref 3.87–5.11)
RDW: 13.2 % (ref 11.5–15.5)
WBC: 6.3 10*3/uL (ref 4.0–10.5)
nRBC: 0 % (ref 0.0–0.2)

## 2020-02-07 LAB — COMPREHENSIVE METABOLIC PANEL
ALT: 20 U/L (ref 0–44)
AST: 23 U/L (ref 15–41)
Albumin: 3.4 g/dL — ABNORMAL LOW (ref 3.5–5.0)
Alkaline Phosphatase: 90 U/L (ref 38–126)
Anion gap: 7 (ref 5–15)
BUN: 14 mg/dL (ref 6–20)
CO2: 26 mmol/L (ref 22–32)
Calcium: 8.6 mg/dL — ABNORMAL LOW (ref 8.9–10.3)
Chloride: 107 mmol/L (ref 98–111)
Creatinine, Ser: 0.75 mg/dL (ref 0.44–1.00)
GFR calc Af Amer: >60 mL/min (ref >60)
GFR calc non Af Amer: >60 mL/min (ref >60)
Glucose, Bld: 99 mg/dL (ref 70–99)
Potassium: 4.2 mmol/L (ref 3.5–5.1)
Sodium: 140 mmol/L (ref 135–145)
Total Bilirubin: 0.5 mg/dL (ref 0.3–1.2)
Total Protein: 6.8 g/dL (ref 6.5–8.1)

## 2020-02-08 LAB — FOLLICLE STIMULATING HORMONE: FSH: 21.4 m[IU]/mL

## 2020-02-08 LAB — LUTEINIZING HORMONE: LH: 13.6 m[IU]/mL

## 2020-02-08 LAB — ESTRADIOL: Estradiol: 30.7 pg/mL

## 2020-02-09 DIAGNOSIS — Z961 Presence of intraocular lens: Secondary | ICD-10-CM | POA: Diagnosis not present

## 2020-02-09 DIAGNOSIS — H2511 Age-related nuclear cataract, right eye: Secondary | ICD-10-CM | POA: Diagnosis not present

## 2020-02-09 DIAGNOSIS — H168 Other keratitis: Secondary | ICD-10-CM | POA: Diagnosis not present

## 2020-02-09 DIAGNOSIS — H40052 Ocular hypertension, left eye: Secondary | ICD-10-CM | POA: Diagnosis not present

## 2020-02-09 DIAGNOSIS — H40003 Preglaucoma, unspecified, bilateral: Secondary | ICD-10-CM | POA: Diagnosis not present

## 2020-02-11 DIAGNOSIS — H25811 Combined forms of age-related cataract, right eye: Secondary | ICD-10-CM | POA: Insufficient documentation

## 2020-02-15 ENCOUNTER — Inpatient Hospital Stay: Payer: Medicaid Other | Admitting: Oncology

## 2020-02-15 ENCOUNTER — Other Ambulatory Visit: Payer: Self-pay

## 2020-02-17 ENCOUNTER — Telehealth: Payer: Self-pay | Admitting: Family Medicine

## 2020-02-17 NOTE — Telephone Encounter (Signed)
Medication Refill - Medication: meloxicam  Has the patient contacted their pharmacy? Yes.   (Agent: If no, request that the patient contact the pharmacy for the refill.) (Agent: If yes, when and what did the pharmacy advise?)  Preferred Pharmacy (with phone number or street name):  [place] [place]  [place] [address]  Phone: [phone] Fax: [phone]  Hours: Not open 24 hours     Agent: Please be advised that RX refills may take up to 3 business days. We ask that you follow-up with your pharmacy.

## 2020-02-17 NOTE — Telephone Encounter (Signed)
Requested medication (s) are due for refill today: yes  Requested medication (s) are on the active medication list: yes  Last refill:  08/05/2019  Future visit scheduled: no  Notes to clinic:  Medication was last filled by Raelyn Ensign and patient has not seen another provider    Requested Prescriptions  Pending Prescriptions Disp Refills   meloxicam (MOBIC) 15 MG tablet 90 tablet 0    Sig: Take 1/2 (one-half) tablet by mouth once daily      Analgesics:  COX2 Inhibitors Passed - 02/17/2020  1:38 PM      Passed - HGB in normal range and within 360 days    Hemoglobin  Date Value Ref Range Status  02/07/2020 12.9 12.0 - 15.0 g/dL Final          Passed - Cr in normal range and within 360 days    Creatinine, Ser  Date Value Ref Range Status  02/07/2020 0.75 0.44 - 1.00 mg/dL Final          Passed - Patient is not pregnant      Passed - Valid encounter within last 12 months    Recent Outpatient Visits           9 months ago Abnormal urine   [facility] Delsa Grana, PA-C   1 year ago Sjogren's syndrome, with unspecified organ involvement [facility])   [place], [place], FNP   1 year ago Lower abdominal pain   [place], FNP   1 year ago Morbid obesity with BMI of 40.0-44.9, adult [facility])   [place], FNP   1 year ago Acute cystitis with hematuria   [place], FNP       Future Appointments             In 1 week Earlie Server, MD [facility]

## 2020-02-21 NOTE — Telephone Encounter (Signed)
Patient stated that pharmacy is having technical issues with electronic prescriptions and asked that her prescriptions be called in . Patient is completely out of medication and would like request expedited.

## 2020-02-21 NOTE — Telephone Encounter (Signed)
Requested medication (s) are due for refill today: yes  Requested medication (s) are on the active medication list: yes  Last refill:  08/05/2019 #90 0 refills pharmacy with technical difficulties  Future visit scheduled: yes. Earlie Server, MD  Notes to clinic:  original request on 02/17/20 has not been addressed at this time. Patient has not been seen by another provider in the office since E. Luciana Axe, [place].     Requested Prescriptions  Pending Prescriptions Disp Refills   meloxicam (MOBIC) 15 MG tablet 90 tablet 0    Sig: Take 1/2 (one-half) tablet by mouth once daily      Analgesics:  COX2 Inhibitors Passed - 02/21/2020  2:13 PM      Passed - HGB in normal range and within 360 days    Hemoglobin  Date Value Ref Range Status  02/07/2020 12.9 12.0 - 15.0 g/dL Final          Passed - Cr in normal range and within 360 days    Creatinine, Ser  Date Value Ref Range Status  02/07/2020 0.75 0.44 - 1.00 mg/dL Final          Passed - Patient is not pregnant      Passed - Valid encounter within last 12 months    Recent Outpatient Visits           9 months ago Abnormal urine   [facility] Delsa Grana, PA-C   1 year ago Sjogren's syndrome, with unspecified organ involvement [facility])   [place], [place], FNP   1 year ago Lower abdominal pain   [place], FNP   1 year ago Morbid obesity with BMI of 40.0-44.9, adult [facility])   [place], FNP   1 year ago Acute cystitis with hematuria   [place], FNP       Future Appointments             In 1 week Earlie Server, MD [facility]

## 2020-02-21 NOTE — Telephone Encounter (Signed)
appt made

## 2020-02-22 ENCOUNTER — Other Ambulatory Visit: Payer: Self-pay

## 2020-02-22 ENCOUNTER — Encounter: Payer: Self-pay | Admitting: Family Medicine

## 2020-02-22 ENCOUNTER — Ambulatory Visit: Payer: Medicaid Other | Admitting: Family Medicine

## 2020-02-22 VITALS — BP 118/78 | HR 92 | Temp 97.8°F | Resp 16 | Ht 65.0 in | Wt 220.7 lb

## 2020-02-22 DIAGNOSIS — M170 Bilateral primary osteoarthritis of knee: Secondary | ICD-10-CM

## 2020-02-22 DIAGNOSIS — M545 Low back pain, unspecified: Secondary | ICD-10-CM

## 2020-02-22 DIAGNOSIS — Z79810 Long term (current) use of selective estrogen receptor modulators (SERMs): Secondary | ICD-10-CM | POA: Diagnosis not present

## 2020-02-22 DIAGNOSIS — E785 Hyperlipidemia, unspecified: Secondary | ICD-10-CM

## 2020-02-22 DIAGNOSIS — M3500 Sicca syndrome, unspecified: Secondary | ICD-10-CM | POA: Diagnosis not present

## 2020-02-22 DIAGNOSIS — R7302 Impaired glucose tolerance (oral): Secondary | ICD-10-CM

## 2020-02-22 DIAGNOSIS — Z6836 Body mass index (BMI) 36.0-36.9, adult: Secondary | ICD-10-CM

## 2020-02-22 DIAGNOSIS — Z853 Personal history of malignant neoplasm of breast: Secondary | ICD-10-CM | POA: Diagnosis not present

## 2020-02-22 DIAGNOSIS — Z9989 Dependence on other enabling machines and devices: Secondary | ICD-10-CM | POA: Diagnosis not present

## 2020-02-22 DIAGNOSIS — G4733 Obstructive sleep apnea (adult) (pediatric): Secondary | ICD-10-CM | POA: Diagnosis not present

## 2020-02-22 DIAGNOSIS — G8929 Other chronic pain: Secondary | ICD-10-CM | POA: Diagnosis not present

## 2020-02-22 MED ORDER — MELOXICAM 15 MG PO TABS: 7.5000 mg | ORAL_TABLET | Freq: Every day | ORAL | 3 refills | Status: DC

## 2020-02-22 MED ORDER — TIZANIDINE HCL 2 MG PO CAPS: 2.0000 mg | ORAL_CAPSULE | Freq: Three times a day (TID) | ORAL | 2 refills | Status: DC | PRN

## 2020-02-22 NOTE — Patient Instructions (Signed)
Work on diet low in carbs and high in veggies and lean meats See if you can get doing exercise about 2 to 3 x a week  Return when you would like to talk about weight management, PCOS, metabolic dysfunction and cholesterol.  See the cholesterol handout

## 2020-02-22 NOTE — Progress Notes (Signed)
Patient ID: Sheena Martinez, female    DOB: 04-14-1961, 59 y.o.   MRN: [id number]  PCP: Hubbard Hartshorn, FNP  Chief Complaint  Patient presents with  . Follow-up  . Medication Refill    meloxicam    Subjective:   Sheena Martinez is a 59 y.o. female, presents to clinic with CC of the following:  HPI   Here for med refill on meloxicam which she take for MSK and joint pain secondary to OA and other autoimmune arthritis  BP good, she has normal renal function with labs done just 2 weeks ago  Last routine OV with PCP was March 2020 Transfer of Care to new PCP   Hx of multiple chronic conditions managed by specialists:  [facility]  Breast CA, started tamoxifen 2 years ago, monitered by Dr. Tasia Catchings   Obesity:  BMI 36- weight and BMI pretty constant over the past year Prior to [place] she was going to the gym and they had available nutritional services - over the past 18 months or so patient has "tried everything" Intermittent fasting - no weight changes or improvement. Previously saw OB specialists who managed some women's health/weight loss   Hx of PCOS, HLD  She has toximultinodular goiter with hyperthyroid, recently completed methimazole tx, monitoring with endo to ensure that she does not develop hypothyroid.  Last labs were normal range - [facility] endo (reviewed through care everywhere - she has f/up appt in the next month for recheck).  OV reviewed from last year with Dr. Ouida Sills: Impression:   The encounter diagnosis was Morbid obesity with BMI of 45.0-49.9, adult (CMS-HCC).  No exercise and part of her inability to lose weight . Possible insulin resistance . Possible thyroid dysfunctionTFT by her endocrinologist  SSRI medication may be adding to weight gain   Plan:  45 minute Direct counseling reviewing detailed information from our scale including BMI , Fat % , Muscle mass, resting metabolism . Health risks associated with her profile  including premature death and cancer risks .  I have discussed Water intake , possible exercise modalities including water aerobic and the goal of exercise 3-4x / week for 30 -45 minutes.  1000 cal diet showed and discussed with the patient .  Offered getting fasting insulin level to she if she has insulin resistance- she will contact her insurance .  Offer B12 + Lipo B shot - she will consider  Start Phentermine 37.5 mg 1/2-1 po qd and side effects discussed . TFT will be done by her endocrinologist in July   Return in about 4 weeks (around 02/05/2019), or weight loss.  [name], MD No insulin labs done - though insulin resistance was suspected  Hyperlipidemia: Currently treated lifestyle Last Lipids: Lab Results  Component Value Date   CHOL 201 (H) 02/04/2019   HDL 50 02/04/2019   LDLCALC 125 (H) 02/04/2019   TRIG 143 02/04/2019   CHOLHDL 4.0 02/04/2019   - Denies: Chest pain, shortness of breath, myalgias, claudication The 10-year ASCVD risk score Mikey Bussing DC Jr., et al., 2013) is: 2.7%   Values used to calculate the score:     Age: 70 years     Sex: Female     Is Non-Hispanic African American: No     Diabetic: No     Tobacco smoker: No     Systolic Blood Pressure: 128 mmHg     Is BP treated: No     HDL Cholesterol: 50  mg/dL     Total Cholesterol: 201 mg/dL   OSA with CPAP current poor compliance for the past 2 months    Patient Active Problem List   Diagnosis Date Noted  . Post-menopausal bleeding 10/25/2019  . Osteopenia 03/18/2019  . Facial flushing 10/21/2018  . Tinnitus of both ears 10/21/2018  . Drug-induced neutropenia ([place]) 10/20/2018  . 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 Thrombocytosis ([place]) 10/20/2018  . 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 Irregular menstrual cycle 03/02/2018  . Screening for cervical cancer 03/02/2018  . Long-term current use of tamoxifen 02/17/2018  . 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 Anxiety 10/03/2017  . Bilateral primary osteoarthritis of knee 10/03/2017  . 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 Malignant neoplasm of upper-inner quadrant of left breast in female, estrogen receptor positive ([place]) 05/22/2017  . Hirsutism 11/24/2015  . Hyperlipidemia 02/01/2014  . Impaired glucose tolerance 02/01/2014  . Vitamin D deficiency 02/01/2014  . Family history of diabetes mellitus 01/31/2014  . Morbid obesity with BMI of 40.0-44.9, adult ([place]) 01/31/2014  . Sjogren's syndrome ([place]) 01/17/2014      Current Outpatient Medications:  .  acetaminophen (TYLENOL) 500 MG tablet, Take 1,000 mg by mouth every 8 (eight) hours as needed for mild pain or headache., Disp: , Rfl:  .  brimonidine (ALPHAGAN) 0.2 % ophthalmic solution, INSTILL 1 DROP INTO LEFT EYE THREE TIMES DAILY, Disp: , Rfl: 12 .  Ca Carbonate-Mag Hydroxide (ROLAIDS PO), Take 1 tablet as needed by mouth (indigestion). , Disp: , Rfl:  .  cholecalciferol (VITAMIN D) 1000 units tablet, Take 2,000 Units by mouth daily., Disp: , Rfl:  .  ketoconazole (NIZORAL) 2 % shampoo, Apply 1 application topically 2 (two) times a week., Disp: 120 mL, Rfl: 3 .  LORazepam (ATIVAN) 0.5 MG tablet, , Disp: , Rfl: 0 .  meloxicam (MOBIC) 15 MG tablet, Take 1/2 (one-half) tablet by mouth once daily, Disp: 90 tablet, Rfl: 0 .  tamoxifen (NOLVADEX) 20 MG tablet, Take 1 tablet (20 mg total) by mouth daily., Disp: 90 tablet, Rfl: 0 .  Timolol Maleate 0.5 % (DAILY) SOLN, Place 1 drop into the left eye 2 (two) times daily., Disp: , Rfl:  .  tizanidine (ZANAFLEX) 2 MG capsule, Take 1 capsule (2 mg total) by mouth 3 (three) times daily., Disp: 30 capsule, Rfl: 1 .  venlafaxine XR (EFFEXOR-XR) 150 MG 24 hr capsule, , Disp: , Rfl:  .  vitamin k 100 MCG tablet, Take 100 mcg by mouth 2 (two) times a week., Disp: , Rfl:  .  methimazole (TAPAZOLE) 10 MG tablet, TAKE 1 TABLET BY MOUTH ONCE DAILY AT 6AM (Patient not  taking: Reported on 02/22/2020), Disp: , Rfl: 2 .  VESICARE 5 MG tablet, Take 5 mg by mouth daily. (Patient not taking: Reported on 02/22/2020), Disp: , Rfl:    Allergies  Allergen Reactions  . Rituximab Anaphylaxis  . Benadryl [Diphenhydramine] Other (See Comments)    Patient felt like she was going to "crawl out of her skin"  . Cortisone Other (See Comments)    Also reacts to cortisone injections; they make her flushed  . Diphenhydramine Hcl   . Clonazepam Anxiety  . Sulfamethoxazole-Trimethoprim Rash    Chest tightness. Bactrim     Social History   Tobacco Use  . Smoking status: Former Smoker    Years: 7.00    Types: Cigarettes    Quit date: 08/13/1979    Years since  quitting: 40.5  . Smokeless tobacco: Never Used  . Tobacco comment: AGE [age]  Vaping Use  . Vaping Use: Never used  Substance Use Topics  . Alcohol use: No  . Drug use: No      Chart Review Today: I personally reviewed active problem list, medication list, allergies, family history, social history, health maintenance, notes from last encounter, lab results, imaging with the patient/caregiver today.   Review of Systems 10 Systems reviewed and are negative for acute change except as noted in the HPI.     Objective:   Vitals:   02/22/20 0943  BP: 118/78  Pulse: 92  Resp: 16  Temp: 97.8 F (36.6 C)  TempSrc: Temporal  SpO2: 98%  Weight: 220 lb 11.2 oz (100.1 kg)  Height: 5\' 5"  (1.651 m)    Body mass index is 36.73 kg/m.  Physical Exam Vitals and nursing note reviewed.  Constitutional:      General: She is not in acute distress.    Appearance: Normal appearance. She is obese. She is not ill-appearing, toxic-appearing or diaphoretic.  HENT:     Head: Normocephalic and atraumatic.     Right Ear: External ear normal.     Left Ear: External ear normal.  Eyes:     General:        Right eye: No discharge.        Left eye: No discharge.     Comments: Left conjunctiva erythematous    Cardiovascular:     Rate and Rhythm: Normal rate and regular rhythm.     Pulses: Normal pulses.     Heart sounds: Normal heart sounds.  Pulmonary:     Effort: Pulmonary effort is normal. No respiratory distress.     Breath sounds: Normal breath sounds.  Abdominal:     General: Bowel sounds are normal. There is no distension.     Palpations: Abdomen is soft.  Musculoskeletal:     Right lower leg: No edema.     Left lower leg: No edema.  Skin:    General: Skin is warm and dry.     Coloration: Skin is not jaundiced or pale.  Neurological:     Mental Status: She is alert.  Psychiatric:        Mood and Affect: Mood normal.        Behavior: Behavior normal.      Results for orders placed or performed in visit on 02/07/20  CBC with Differential  Result Value Ref Range   WBC 6.3 4.0 - 10.5 K/uL   RBC 4.12 3.87 - 5.11 MIL/uL   Hemoglobin 12.9 12.0 - 15.0 g/dL   HCT 38.6 36 - 46 %   MCV 93.7 80.0 - 100.0 fL   MCH 31.3 26.0 - 34.0 pg   MCHC 33.4 30.0 - 36.0 g/dL   RDW 13.2 11.5 - 15.5 %   Platelets 285 150 - 400 K/uL   nRBC 0.0 0.0 - 0.2 %   Neutrophils Relative % 53 %   Neutro Abs 3.3 1.7 - 7.7 K/uL   Lymphocytes Relative 34 %   Lymphs Abs 2.1 0.7 - 4.0 K/uL   Monocytes Relative 11 %   Monocytes Absolute 0.7 0 - 1 K/uL   Eosinophils Relative 2 %   Eosinophils Absolute 0.1 0 - 0 K/uL   Basophils Relative 0 %   Basophils Absolute 0.0 0 - 0 K/uL   Immature Granulocytes 0 %   Abs Immature Granulocytes 0.01 0.00 - 0.07  K/uL  Estradiol  Result Value Ref Range   Estradiol 16.1 pg/mL  Follicle stimulating hormone  Result Value Ref Range   FSH 21.4 mIU/mL  Comprehensive metabolic panel  Result Value Ref Range   Sodium 140 135 - 145 mmol/L   Potassium 4.2 3.5 - 5.1 mmol/L   Chloride 107 98 - 111 mmol/L   CO2 26 22 - 32 mmol/L   Glucose, Bld 99 70 - 99 mg/dL   BUN 14 6 - 20 mg/dL   Creatinine, Ser 0.75 0.44 - 1.00 mg/dL   Calcium 8.6 (L) 8.9 - 10.3 mg/dL   Total Protein  6.8 6.5 - 8.1 g/dL   Albumin 3.4 (L) 3.5 - 5.0 g/dL   AST 23 15 - 41 U/L   ALT 20 0 - 44 U/L   Alkaline Phosphatase 90 38 - 126 U/L   Total Bilirubin 0.5 0.3 - 1.2 mg/dL   GFR calc non Af Amer >60 >60 mL/min   GFR calc Af Amer >60 >60 mL/min   Anion gap 7 5 - 15  Luteinizing hormone  Result Value Ref Range   LH 13.6 mIU/mL       Assessment & Plan:     ICD-10-CM   1. Class 2 severe obesity with serious comorbidity and body mass index (BMI) of 36.0 to 36.9 in adult, unspecified obesity type (HCC)  E66.01    Z68.36    HLD, OSA, abdominal obesity, suspected insulin resistance, I suspect metabolic syndrome - return for OV dedicated to obesity/weight management  2. Chronic bilateral low back pain without sciatica  M54.5 meloxicam (MOBIC) 15 MG tablet   G89.29 tizanidine (ZANAFLEX) 2 MG capsule  3. OSA on CPAP  G47.33    Z99.89    reviewed SE of OSA not treated, encouraged her to be compliant with CPAP  4. Sjogren's syndrome, with unspecified organ involvement ([place])  M35.00    per rheumatology at [facility]  5. Hyperlipidemia, unspecified hyperlipidemia type  E78.5    Trying to work on healthy diet and loose weight, reviewed diet, will recheck labs at next routine OV  6. Bilateral primary osteoarthritis of knee  M17.0    mobic refill today, reviewed labs, GI sx, BP, no concerns with 7.5 to 15 mg daily dosing, muscle relaxer refilled for back   7. Long-term current use of tamoxifen  Z79.810    per Dr. Tasia Catchings oncology, pt continues to have hot flashes/sweats x 2 years  8. Impaired glucose tolerance  R73.02    healthy diet, limit carbs and simple sugars, recheck labs and next appt, A1C and insulin?  last chemistry was reviewed  9. History of breast cancer  Z85.3    per Dr. Marcellina Millin, PA-C 02/22/20 9:50 AM

## 2020-02-28 ENCOUNTER — Encounter: Payer: Self-pay | Admitting: Oncology

## 2020-02-28 ENCOUNTER — Inpatient Hospital Stay: Payer: Medicaid Other | Attending: Oncology | Admitting: Oncology

## 2020-02-28 DIAGNOSIS — Z808 Family history of malignant neoplasm of other organs or systems: Secondary | ICD-10-CM | POA: Diagnosis not present

## 2020-02-28 DIAGNOSIS — Z7952 Long term (current) use of systemic steroids: Secondary | ICD-10-CM | POA: Insufficient documentation

## 2020-02-28 DIAGNOSIS — Z832 Family history of diseases of the blood and blood-forming organs and certain disorders involving the immune mechanism: Secondary | ICD-10-CM | POA: Insufficient documentation

## 2020-02-28 DIAGNOSIS — Z888 Allergy status to other drugs, medicaments and biological substances status: Secondary | ICD-10-CM | POA: Diagnosis not present

## 2020-02-28 DIAGNOSIS — M3500 Sicca syndrome, unspecified: Secondary | ICD-10-CM | POA: Diagnosis not present

## 2020-02-28 DIAGNOSIS — Z800 Family history of malignant neoplasm of digestive organs: Secondary | ICD-10-CM | POA: Diagnosis not present

## 2020-02-28 DIAGNOSIS — M8580 Other specified disorders of bone density and structure, unspecified site: Secondary | ICD-10-CM | POA: Insufficient documentation

## 2020-02-28 DIAGNOSIS — Z560 Unemployment, unspecified: Secondary | ICD-10-CM | POA: Diagnosis not present

## 2020-02-28 DIAGNOSIS — Z881 Allergy status to other antibiotic agents status: Secondary | ICD-10-CM | POA: Diagnosis not present

## 2020-02-28 DIAGNOSIS — Z79899 Other long term (current) drug therapy: Secondary | ICD-10-CM | POA: Insufficient documentation

## 2020-02-28 DIAGNOSIS — Z809 Family history of malignant neoplasm, unspecified: Secondary | ICD-10-CM | POA: Diagnosis not present

## 2020-02-28 DIAGNOSIS — Z8379 Family history of other diseases of the digestive system: Secondary | ICD-10-CM | POA: Diagnosis not present

## 2020-02-28 DIAGNOSIS — R232 Flushing: Secondary | ICD-10-CM | POA: Insufficient documentation

## 2020-02-28 DIAGNOSIS — C50212 Malignant neoplasm of upper-inner quadrant of left female breast: Secondary | ICD-10-CM

## 2020-02-28 DIAGNOSIS — Z806 Family history of leukemia: Secondary | ICD-10-CM | POA: Diagnosis not present

## 2020-02-28 DIAGNOSIS — F419 Anxiety disorder, unspecified: Secondary | ICD-10-CM | POA: Diagnosis not present

## 2020-02-28 DIAGNOSIS — Z801 Family history of malignant neoplasm of trachea, bronchus and lung: Secondary | ICD-10-CM | POA: Insufficient documentation

## 2020-02-28 DIAGNOSIS — Z170 Estrogen receptor positive status [ER+]: Secondary | ICD-10-CM | POA: Insufficient documentation

## 2020-02-28 DIAGNOSIS — Z803 Family history of malignant neoplasm of breast: Secondary | ICD-10-CM | POA: Diagnosis not present

## 2020-02-28 DIAGNOSIS — Z79810 Long term (current) use of selective estrogen receptor modulators (SERMs): Secondary | ICD-10-CM

## 2020-02-28 DIAGNOSIS — Z95828 Presence of other vascular implants and grafts: Secondary | ICD-10-CM | POA: Diagnosis not present

## 2020-02-28 DIAGNOSIS — Z87891 Personal history of nicotine dependence: Secondary | ICD-10-CM | POA: Diagnosis not present

## 2020-02-28 DIAGNOSIS — M1990 Unspecified osteoarthritis, unspecified site: Secondary | ICD-10-CM | POA: Insufficient documentation

## 2020-02-28 DIAGNOSIS — Z8041 Family history of malignant neoplasm of ovary: Secondary | ICD-10-CM | POA: Diagnosis not present

## 2020-02-28 DIAGNOSIS — Z833 Family history of diabetes mellitus: Secondary | ICD-10-CM | POA: Insufficient documentation

## 2020-02-28 DIAGNOSIS — E039 Hypothyroidism, unspecified: Secondary | ICD-10-CM | POA: Insufficient documentation

## 2020-02-28 NOTE — Progress Notes (Signed)
Patient denies new problems/concerns today.   °

## 2020-02-28 NOTE — Progress Notes (Signed)
HEMATOLOGY-ONCOLOGY TeleHEALTH VISIT PROGRESS NOTE  I connected with Sheena Martinez on 02/28/20 at  2:30 PM EDT by video enabled telemedicine visit and verified that I am speaking with the correct person using two identifiers. I discussed the limitations, risks, security and privacy concerns of performing an evaluation and management service by telemedicine and the availability of in-person appointments. I also discussed with the patient that there may be a patient responsible charge related to this service. The patient expressed understanding and agreed to proceed.   Other persons participating in the visit and their role in the encounter:  None  Patient's location: Home  Provider's location: office Chief Complaint: Follow-up for breast cancer management  PERTINENT ONCOLOGY HISTORY Sheena Martinez is a 59 y.o.afemale who has above oncology history reviewed by me today presented for follow up visit for management of breast cancer Patient follows up with [facility] psychiatrist for anxiety and has been on Effexor.  She recently increased Effexor to 150 mg which did not help her symptoms further.  So now she is back to 75 mg Effexor daily. Hypothyroidism, on methimazole.  She follows up with Dr. Lavinia Sharps and plan to have iodine 131 ablation in January. Patient has been advised by endocrinology to stop methimazole 3 weeks prior to her procedure. Patient follows up with ophthalmology for corneal ulcer.    INTERVAL HISTORY Sheena Martinez is a 59 y.o. female who has above history reviewed by me today presents for follow up visit for management of breast cancer management Problems and complaints are listed below:  Patient has been taking tamoxifen 20 mg daily. Patient had another menstrual.  In February 2021. She is currently restarted with steroid after cataract surgery. Denies any new concerns of her breast. Patient was seen by me 6 months ago and was offered in person visit.  Per scheduling note, patient  preferred to do a virtual visit today.   Review of Systems  Constitutional: Negative for appetite change, chills, fatigue and fever.  HENT:   Negative for hearing loss and voice change.        Chronic eye issue, follows up with ophthalmologist  Eyes: Negative for eye problems.  Respiratory: Negative for chest tightness and cough.   Cardiovascular: Negative for chest pain.  Gastrointestinal: Negative for abdominal distention, abdominal pain and blood in stool.  Endocrine: Positive for hot flashes.  Genitourinary: Negative for difficulty urinating and frequency.   Musculoskeletal: Negative for arthralgias.  Skin: Negative for itching and rash.  Neurological: Negative for extremity weakness.  Hematological: Negative for adenopathy.  Psychiatric/Behavioral: Negative for confusion. The patient is not nervous/anxious.     Past Medical History:  Diagnosis Date  . Anxiety   . Arthritis   . Breast cancer ([place]) 05/12/2017   INVASIVE MAMMARY CARCINOMA ER/PR positive LEFT BREAST UPPER inner  QUAD  . 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 GERD (gastroesophageal reflux disease)    OCC  . Goiter   . Hematuria 10/21/2018  . Hyperthyroidism   . Personal history of radiation therapy   . Sjogren's syndrome ([place])   . Uses continuous positive airway pressure (CPAP) ventilation at home 04/30/2019   Past Surgical History:  Procedure Laterality Date  . BREAST BIOPSY Left 05/12/2017   [place] bx/ INVASIVE MAMMARY CARCINOMA  . BREAST LUMPECTOMY Left 05/2017   invasive mammary carcinoma, neg margins  . BREAST LUMPECTOMY WITH SENTINEL LYMPH NODE BIOPSY Left 06/10/2017  Procedure: BREAST LUMPECTOMY WITH SENTINEL LYMPH NODE BX AND NEEDLE LOCALIZATION;  Surgeon: Robert Bellow, MD;  Location: [facility] ORS;  Service: General;  Laterality: Left;  . BREAST MAMMOSITE Left 06/24/2017   Procedure: MAMMOSITE BREAST;  Surgeon: Robert Bellow, MD;  Location: [facility] ORS;  Service: General;  Laterality: Left;  . CESAREAN SECTION  1995  . 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 COSMETIC SURGERY    . CYST EXCISION  2018   pilar cyst/ Dr Will Bonnet  . EYE SURGERY    . PORTACATH PLACEMENT Right 07/08/2017   Procedure: INSERTION PORT-A-CATH;  Surgeon: Robert Bellow, MD;  Location: [facility] ORS;  Service: General;  Laterality: Right;  . TONSILLECTOMY      Family History  Problem Relation Age of Onset  . Breast cancer Maternal Aunt 34       currently ~65  . Diabetes Mother   . Skin cancer Mother        currently 61; TAH/BSO (age?)  . Anemia Mother   . Liver disease Father        deceased / not much info about him / alcoholic  . Lung cancer Paternal Aunt        smoker / deceased 59s  . Stomach cancer Paternal Uncle 38       deceased / deceased 68s  . Ovarian cancer Maternal Grandmother 50       deceased 54s  . Stomach cancer Paternal Grandfather 58       deceased 70s  . Cancer Maternal Uncle        unk. type; deceased 89s  . Leukemia Cousin 81       deceased 38    Social History   Socioeconomic History  . Marital status: Married    Spouse name: Eulas Post  . Number of children: 3  . Years of education: Not on file  . Highest education level: GED or equivalent  Occupational History  . Occupation: unemployed  Tobacco Use  . Smoking status: Former Smoker    Years: 7.00    Types: Cigarettes    Quit date: 08/13/1979    Years since quitting: 40.5  . Smokeless tobacco: Never Used  . Tobacco comment: AGE [age]  Vaping Use  . Vaping Use: Never used  Substance and Sexual Activity  . Alcohol use: No  . Drug use: No  . Sexual activity: Yes    Partners: Male  Other Topics Concern  . Not on file  Social History Narrative  . Not on file   Social Determinants of Health   Financial Resource Strain:   . Difficulty of Paying Living Expenses:   Food Insecurity:   . Worried About Charity fundraiser in the Last Year:   . Arts development officer in the Last Year:   Transportation Needs:   . Film/video editor (Medical):   Marland Kitchen Lack of Transportation (Non-Medical):   Physical Activity:   . Days of Exercise per Week:   . Minutes of Exercise per Session:   Stress:   . Feeling of Stress :   Social Connections:   . Frequency of Communication with Friends and Family:   . Frequency of Social Gatherings with Friends and Family:   . Attends Religious Services:   . Active Member of Clubs or Organizations:   . Attends Archivist Meetings:   Marland Kitchen Marital Status:   Intimate Partner Violence:   . Fear of Current or Ex-Partner:   .  Emotionally Abused:   Marland Kitchen Physically Abused:   . Sexually Abused:     Current Outpatient Medications on File Prior to Visit  Medication Sig Dispense Refill  . acetaminophen (TYLENOL) 500 MG tablet Take 1,000 mg by mouth every 8 (eight) hours as needed for mild pain or headache.    . brimonidine (ALPHAGAN) 0.2 % ophthalmic solution INSTILL 1 DROP INTO LEFT EYE THREE TIMES DAILY  12  . Ca Carbonate-Mag Hydroxide (ROLAIDS PO) Take 1 tablet as needed by mouth (indigestion).     . cholecalciferol (VITAMIN D) 1000 units tablet Take 2,000 Units by mouth daily.    Marland Kitchen doxycycline (VIBRA-TABS) 100 MG tablet Take by mouth.    Marland Kitchen ketoconazole (NIZORAL) 2 % shampoo Apply 1 application topically 2 (two) times a week. 120 mL 3  . LORazepam (ATIVAN) 0.5 MG tablet   0  . meloxicam (MOBIC) 15 MG tablet Take 0.5-1 tablets (7.5-15 mg total) by mouth daily. For chronic joint pain and arthritis 90 tablet 3  . prednisoLONE acetate (PRED FORTE) 1 % ophthalmic suspension Administer 1 drop into the left eye Four (4) times a day.    . predniSONE (DELTASONE) 20 MG tablet Take by mouth.    . tamoxifen (NOLVADEX) 20 MG tablet Take 1 tablet (20 mg total) by mouth daily. 90 tablet 0  . Timolol Maleate 0.5 % (DAILY) SOLN Place 1 drop into the left eye 2 (two) times daily.    . tizanidine (ZANAFLEX) 2 MG capsule Take 1 capsule  (2 mg total) by mouth 3 (three) times daily as needed for muscle spasms. 30 capsule 2  . venlafaxine XR (EFFEXOR-XR) 150 MG 24 hr capsule     . vitamin k 100 MCG tablet Take 100 mcg by mouth 2 (two) times a week.     No current facility-administered medications on file prior to visit.    Allergies  Allergen Reactions  . Rituximab Anaphylaxis  . Benadryl [Diphenhydramine] Other (See Comments)    Patient felt like she was going to "crawl out of her skin"  . Cortisone Other (See Comments)    Also reacts to cortisone injections; they make her flushed  . Diphenhydramine Hcl   . Clonazepam Anxiety  . Sulfamethoxazole-Trimethoprim Rash    Chest tightness. Bactrim       Observations/Objective: Today's Vitals   02/28/20 1421  PainSc: 0-No pain   There is no height or weight on file to calculate BMI.  Physical Exam Constitutional:      General: She is not in acute distress. Neurological:     Mental Status: She is alert and oriented to person, place, and time.     CBC    Component Value Date/Time   WBC 6.3 02/07/2020 1414   RBC 4.12 02/07/2020 1414   HGB 12.9 02/07/2020 1414   HCT 38.6 02/07/2020 1414   PLT 285 02/07/2020 1414   MCV 93.7 02/07/2020 1414   MCH 31.3 02/07/2020 1414   MCHC 33.4 02/07/2020 1414   RDW 13.2 02/07/2020 1414   LYMPHSABS 2.1 02/07/2020 1414   MONOABS 0.7 02/07/2020 1414   EOSABS 0.1 02/07/2020 1414   BASOSABS 0.0 02/07/2020 1414    CMP     Component Value Date/Time   NA 140 02/07/2020 1414   K 4.2 02/07/2020 1414   CL 107 02/07/2020 1414   CO2 26 02/07/2020 1414   GLUCOSE 99 02/07/2020 1414   BUN 14 02/07/2020 1414   CREATININE 0.75 02/07/2020 1414   CALCIUM 8.6 (L)  02/07/2020 1414   PROT 6.8 02/07/2020 1414   ALBUMIN 3.4 (L) 02/07/2020 1414   AST 23 02/07/2020 1414   ALT 20 02/07/2020 1414   ALKPHOS 90 02/07/2020 1414   BILITOT 0.5 02/07/2020 1414   GFRNONAA >60 02/07/2020 1414   GFRAA >60 02/07/2020 1414    RADIOGRAPHIC  STUDIES: I have personally reviewed the radiological images as listed and agreed with the findings in the report. No results found.  Assessment and Plan: 1. Malignant neoplasm of upper-inner quadrant of left breast in female, estrogen receptor positive ([place])   2. Port-A-Cath in place   3. Long-term current use of tamoxifen   4. Osteopenia, unspecified location     #Stage IA, left Breast cancer Labs are reviewed and discussed with patient. Continue tamoxifen 20 mg daily. FSH, LH and estradiol levels are reviewed.  Patient seen perimenopausal state.  Continue tamoxifen. Patient is due for annual bilateral diagnostic mammogram in September 2021.  Will obtain   #Osteopenia, Recommend patient to continue calcium and vitamin D supplementation  #Anxiety and hot flash, continue Effexor which may potentially help her hot flash while taking tamoxifen. #Hypothyroidism, follow-up with endocrinologist #Corneal ulcer, secondary to autoimmune disorder.  Currently on steroids.  Continue follow-up with ophthalmology.    #Port-A-Cath in place, continue port flush every 8 weeks. Follow Up Instructions: 6 months   I discussed the assessment and treatment plan with the patient. The patient was provided an opportunity to ask questions and all were answered. The patient agreed with the plan and demonstrated an understanding of the instructions.  The patient was advised to call back or seek an in-person evaluation if the symptoms worsen or if the condition fails to improve as anticipated.   Earlie Server, MD 02/28/2020 9:10 PM

## 2020-03-12 DIAGNOSIS — G4733 Obstructive sleep apnea (adult) (pediatric): Secondary | ICD-10-CM | POA: Diagnosis not present

## 2020-03-14 ENCOUNTER — Ambulatory Visit (INDEPENDENT_AMBULATORY_CARE_PROVIDER_SITE_OTHER): Payer: Medicaid Other | Admitting: Obstetrics & Gynecology

## 2020-03-14 ENCOUNTER — Other Ambulatory Visit: Payer: Self-pay

## 2020-03-14 ENCOUNTER — Encounter: Payer: Self-pay | Admitting: Obstetrics & Gynecology

## 2020-03-14 VITALS — BP 130/80 | Ht 60.0 in | Wt 223.0 lb

## 2020-03-14 DIAGNOSIS — Z01419 Encounter for gynecological examination (general) (routine) without abnormal findings: Secondary | ICD-10-CM

## 2020-03-14 NOTE — Patient Instructions (Signed)
PAP every three years Mammogram every year    Call [phone] to schedule at [facility] Colonoscopy every 10 years Labs yearly (with PCP)  Thank you for choosing [facility]. As part of our ongoing efforts to improve patient experience, we would appreciate your feedback. Please fill out the short survey that you will receive by mail or MyChart. Your opinion is important to us! - Dr. Ravleen Ries   

## 2020-03-14 NOTE — Progress Notes (Signed)
HPI:      [name] is a 59 y.o. [phone] who LMP was in the past, she presents today for her annual examination.  The patient has no complaints today. The patient is sexually active. Herlast pap: approximate date 2019 and was normal and last mammogram: approximate date 2020 and was normal.  The patient does perform self breast exams.  There is notable family history of breast or ovarian cancer in her family, INCLUDING herself; next MMG due/scheduled Sept.  The patient is not taking hormone replacement therapy. Patient denies post-menopausal vaginal bleeding.  EMB for PMB in 10/2019 normal, stopped bleeding then.  Recent steroid use for eye problem and has had one episode of tinting of vag d/c. The patient has regular exercise: yes. The patient denies current symptoms of depression.    GYN Hx: Last Colonoscopy:never, but has had Cologuard one year ago. Normal.  Last DEXA: never ago.    PMHx: Past Medical History:  Diagnosis Date  . Anxiety   . Arthritis   . 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 GERD (gastroesophageal reflux disease)    OCC  . Goiter   . Hematuria 10/21/2018  . Hyperthyroidism   . Personal history of radiation therapy   . Sjogren's syndrome ([place])   . Uses continuous positive airway pressure (CPAP) ventilation at home 04/30/2019   Past Surgical History:  Procedure Laterality Date  . BREAST BIOPSY Left 05/12/2017   [place] bx/ INVASIVE MAMMARY CARCINOMA  . BREAST LUMPECTOMY Left 05/2017   invasive mammary carcinoma, neg margins  . BREAST LUMPECTOMY WITH SENTINEL LYMPH NODE BIOPSY Left 06/10/2017   Procedure: BREAST LUMPECTOMY WITH SENTINEL LYMPH NODE BX AND NEEDLE LOCALIZATION;  Surgeon: Milliana Reddoch Bellow, MD;  Location: [facility] ORS;  Service: General;  Laterality: Left;  . BREAST MAMMOSITE  Left 06/24/2017   Procedure: MAMMOSITE BREAST;  Surgeon: Riggins Cisek Bellow, MD;  Location: [facility] ORS;  Service: General;  Laterality: Left;  . CESAREAN SECTION  1995  . 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 COSMETIC SURGERY    . CYST EXCISION  2018   pilar cyst/ Dr Will Bonnet  . EYE SURGERY    . PORTACATH PLACEMENT Right 07/08/2017   Procedure: INSERTION PORT-A-CATH;  Surgeon: Isatu Macinnes Bellow, MD;  Location: [facility] ORS;  Service: General;  Laterality: Right;  . TONSILLECTOMY     Family History  Problem Relation Age of Onset  . Breast cancer Maternal Aunt 3       currently ~65  . Diabetes Mother   . Skin cancer Mother        currently 104; TAH/BSO (age?)  . Anemia Mother   . Liver disease Father        deceased / not much info about him / alcoholic  . Lung cancer Paternal Aunt        smoker / deceased 79s  . Stomach cancer Paternal Uncle 32       deceased / deceased 3s  . Ovarian cancer Maternal Grandmother 66       deceased 81s  . Stomach cancer Paternal Grandfather 29       deceased 53s  . Cancer Maternal Uncle        unk. type; deceased 71s  . Leukemia Cousin 75  deceased 52   Social History   Tobacco Use  . Smoking status: Former Smoker    Years: 7.00    Types: Cigarettes    Quit date: 08/13/1979    Years since quitting: 40.6  . Smokeless tobacco: Never Used  . Tobacco comment: AGE [age]  Vaping Use  . Vaping Use: Never used  Substance Use Topics  . Alcohol use: No  . Drug use: No    Current Outpatient Medications:  .  acetaminophen (TYLENOL) 500 MG tablet, Take 1,000 mg by mouth every 8 (eight) hours as needed for mild pain or headache., Disp: , Rfl:  .  Ca Carbonate-Mag Hydroxide (ROLAIDS PO), Take 1 tablet as needed by mouth (indigestion). , Disp: , Rfl:  .  cholecalciferol (VITAMIN D) 1000 units tablet, Take 2,000 Units by mouth daily., Disp: , Rfl:  .  doxycycline (VIBRA-TABS) 100 MG tablet, Take by mouth., Disp: , Rfl:  .  ketoconazole (NIZORAL) 2 %  shampoo, Apply 1 application topically 2 (two) times a week., Disp: 120 mL, Rfl: 3 .  LORazepam (ATIVAN) 0.5 MG tablet, , Disp: , Rfl: 0 .  meloxicam (MOBIC) 15 MG tablet, Take 0.5-1 tablets (7.5-15 mg total) by mouth daily. For chronic joint pain and arthritis, Disp: 90 tablet, Rfl: 3 .  prednisoLONE acetate (PRED FORTE) 1 % ophthalmic suspension, Administer 1 drop into the left eye Four (4) times a day., Disp: , Rfl:  .  tamoxifen (NOLVADEX) 20 MG tablet, Take 1 tablet (20 mg total) by mouth daily., Disp: 90 tablet, Rfl: 0 .  tizanidine (ZANAFLEX) 2 MG capsule, Take 1 capsule (2 mg total) by mouth 3 (three) times daily as needed for muscle spasms., Disp: 30 capsule, Rfl: 2 .  venlafaxine XR (EFFEXOR-XR) 150 MG 24 hr capsule, , Disp: , Rfl:  .  vitamin k 100 MCG tablet, Take 100 mcg by mouth 2 (two) times a week., Disp: , Rfl:  .  brimonidine (ALPHAGAN) 0.2 % ophthalmic solution, INSTILL 1 DROP INTO LEFT EYE THREE TIMES DAILY, Disp: , Rfl: 12 .  Timolol Maleate 0.5 % (DAILY) SOLN, Place 1 drop into the left eye 2 (two) times daily., Disp: , Rfl:  Allergies: Rituximab, Benadryl [diphenhydramine], Cortisone, Diphenhydramine hcl, Clonazepam, and Sulfamethoxazole-trimethoprim  Review of Systems  Constitutional: Negative for chills, fever and malaise/fatigue.  HENT: Negative for congestion, sinus pain and sore throat.   Eyes: Negative for blurred vision and pain.  Respiratory: Negative for cough and wheezing.   Cardiovascular: Negative for chest pain and leg swelling.  Gastrointestinal: Negative for abdominal pain, constipation, diarrhea, heartburn, nausea and vomiting.  Genitourinary: Negative for dysuria, frequency, hematuria and urgency.  Musculoskeletal: Negative for back pain, joint pain, myalgias and neck pain.  Skin: Negative for itching and rash.  Neurological: Negative for dizziness, tremors and weakness.  Endo/Heme/Allergies: Does not bruise/bleed easily.  Psychiatric/Behavioral:  Negative for depression. The patient is nervous/anxious. The patient does not have insomnia.     Objective: BP 130/80   Ht 5' (1.524 m)   Wt 223 lb (101.2 kg)   LMP 09/13/2019 (LMP Unknown)   BMI 43.55 kg/m   Filed Weights   03/14/20 1312  Weight: 223 lb (101.2 kg)   Body mass index is 43.55 kg/m. Physical Exam Constitutional:      General: She is not in acute distress.    Appearance: She is well-developed.  Genitourinary:     Pelvic exam was performed with patient supine.     Vagina, uterus and  rectum normal.     No lesions in the vagina.     No vaginal bleeding.     No cervical motion tenderness, friability, lesion or polyp.     Uterus is mobile.     Uterus is not enlarged.     No uterine mass detected.    Uterus is midaxial.     No right or left adnexal mass present.     Right adnexa not tender.     Left adnexa not tender.  HENT:     Head: Normocephalic and atraumatic. No laceration.     Right Ear: Hearing normal.     Left Ear: Hearing normal.     Mouth/Throat:     Pharynx: Uvula midline.  Eyes:     Pupils: Pupils are equal, round, and reactive to light.  Neck:     Thyroid: No thyromegaly.  Cardiovascular:     Rate and Rhythm: Normal rate and regular rhythm.     Heart sounds: No murmur heard.  No friction rub. No gallop.   Pulmonary:     Effort: Pulmonary effort is normal. No respiratory distress.     Breath sounds: Normal breath sounds. No wheezing.  Chest:     Breasts:        Right: No mass, skin change or tenderness.        Left: No mass, skin change or tenderness.  Abdominal:     General: Bowel sounds are normal. There is no distension.     Palpations: Abdomen is soft.     Tenderness: There is no abdominal tenderness. There is no rebound.  Musculoskeletal:        General: Normal range of motion.     Cervical back: Normal range of motion and neck supple.  Neurological:     Mental Status: She is alert and oriented to person, place, and time.      Cranial Nerves: No cranial nerve deficit.  Skin:    General: Skin is warm and dry.  Psychiatric:        Judgment: Judgment normal.  Vitals reviewed.     Assessment: Annual Exam 1. Women's annual routine gynecological examination     Plan:            1.  Cervical Screening-  Pap smear schedule reviewed with patient  2. Breast screening- Exam annually and mammogram scheduled  3. Colonoscopy every 10 years, Hemoccult testing after age 57  4. Labs managed by PCP  5. Counseling for hormonal therapy: none              6. FRAX - FRAX score for assessing the 10 year probability for fracture calculated and discussed today.  Based on age and score today, DEXA is not currently scheduled.    F/U  Return in about 1 year (around 03/14/2021) for Annual.  Barnett Applebaum, MD, [facility], [facility] 03/14/2020  2:20 PM

## 2020-03-25 DIAGNOSIS — Z20822 Contact with and (suspected) exposure to covid-19: Secondary | ICD-10-CM | POA: Diagnosis not present

## 2020-03-26 ENCOUNTER — Other Ambulatory Visit: Payer: Self-pay | Admitting: Oncology

## 2020-03-28 ENCOUNTER — Ambulatory Visit: Payer: Self-pay

## 2020-03-28 ENCOUNTER — Telehealth: Payer: Self-pay | Admitting: Family Medicine

## 2020-03-28 NOTE — Telephone Encounter (Signed)
Add on for tomorrow @ 9:00 for vitual

## 2020-03-28 NOTE — Telephone Encounter (Signed)
Pt prescribed steroids due to thinning cornea by eye doctor at [facility]. Currently she is tapering steroid. She is taking  10 mg until Friday and 5 mg until until next Friday. Opthalmologist asked pt to call PCP to determine if she can continue to take steroids or if the steroids needs to be stopped due to Covid infection.  Covid sx: feels chills, mild  Dry cough, fatigue, no fever. Chest feels tight. Can deep breath and is not SOB. Is not vaccinated for Covid due to previous reactions to vaccinations. Was also told not get vaccination due to steroids. Discussed to call PCP if any SOB or difficulty breathing, signs of dehydration or if any phlegm is brown or yellow.  Sending to office high priority due to the nature of issue. Please call pt back.  Reason for Disposition . [1] Caller requesting NON-URGENT health information AND [2] PCP's office is the best resource  Answer Assessment - Initial Assessment Questions 1. REASON FOR CALL or QUESTION: "What is your reason for calling today?" or "How can I best help you?" or "What question do you have that I can help answer?"     Summary: steroids and covid    Pt is on steroids for her cornea that started thinning and now has covid/ Pt wants to know if she should stop taking the steroids or wean all the way down  Protocols used: [place]

## 2020-03-29 ENCOUNTER — Telehealth (INDEPENDENT_AMBULATORY_CARE_PROVIDER_SITE_OTHER): Payer: Medicaid Other | Admitting: Family Medicine

## 2020-03-29 ENCOUNTER — Encounter: Payer: Self-pay | Admitting: Family Medicine

## 2020-03-29 ENCOUNTER — Other Ambulatory Visit: Payer: Self-pay

## 2020-03-29 VITALS — Ht 60.0 in | Wt 223.0 lb

## 2020-03-29 DIAGNOSIS — Z79810 Long term (current) use of selective estrogen receptor modulators (SERMs): Secondary | ICD-10-CM

## 2020-03-29 DIAGNOSIS — E785 Hyperlipidemia, unspecified: Secondary | ICD-10-CM

## 2020-03-29 DIAGNOSIS — Z20822 Contact with and (suspected) exposure to covid-19: Secondary | ICD-10-CM | POA: Diagnosis not present

## 2020-03-29 DIAGNOSIS — Z6836 Body mass index (BMI) 36.0-36.9, adult: Secondary | ICD-10-CM | POA: Diagnosis not present

## 2020-03-29 DIAGNOSIS — D849 Immunodeficiency, unspecified: Secondary | ICD-10-CM

## 2020-03-29 DIAGNOSIS — M3500 Sicca syndrome, unspecified: Secondary | ICD-10-CM | POA: Diagnosis not present

## 2020-03-29 NOTE — Progress Notes (Signed)
Name: Sheena Martinez   MRN: [id number]    DOB: 1961/01/04   Date:03/29/2020       Progress Note  Subjective:   Chief Complaint  Chief Complaint  Patient presents with  . Medication Management    Pt was told to reach out to discuss if it was okay to continue prednisone while having covid that was given by eye dr. for thinning cornea.    I connected with  [name]  on 03/29/20 at  9:00 AM EDT by a video enabled telemedicine application and verified that I am speaking with the correct person using two identifiers.  I discussed the limitations of evaluation and management by telemedicine and the availability of in person appointments. The patient expressed understanding and agreed to proceed. Staff also discussed with the patient that there may be a patient responsible charge related to this service. Patient Location: home Provider Location: office ([facility]) Additional Individuals present: no  HPI  Son is ill with covid x 10 day, close in home exposure, she tested last Saturday 03/25/2020, PCR test was neg, she developed sx on 03/26/2020 and is due to retest  Since feeling sick pt first developed a scratchy throat, she has severe myalgias "feels like she got hit by a bus"  She denies any chest pain, shortness of breath she does have mild cough, does notice some eye irritation when her symptoms first began felt like a "fever in her eyes" she denies fever sweats chills but she is overall fatigued  She was referred here today to inquire about continuing a prednisone taper that she has been given from ophthalmology.  On 03/06/2020 she started 60 milligrams prednisone and the dose gradually decreased, currently she is on 10 mg prednisone for the rest of the week and will decrease it further next week to 5 mg for the entire week. She was given high-dose oral steroids with a taper in addition to doxycycline 100 mg daily and steroid eyedrops from the ophthalmologist  Temp 98.7 Pulse  Ox 96% HR 76  Lumpectomy with ER+ on tamoxifen   [facility] retesting tomorrow  With anxiety - she is having waves of anxiety which cause palpitations and she's not sure how to tell if its anxiety or worsening illness sx, she does have effexor and ativan prn  Patient Active Problem List   Diagnosis Date Noted  . Post-menopausal bleeding 10/25/2019  . Osteopenia 03/18/2019  . Facial flushing 10/21/2018  . Tinnitus of both ears 10/21/2018  . Drug-induced neutropenia ([place]) 10/20/2018  . 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 Thrombocytosis ([place]) 10/20/2018  . 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 Irregular menstrual cycle 03/02/2018  . Screening for cervical cancer 03/02/2018  . Long-term current use of tamoxifen 02/17/2018  . 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 Anxiety 10/03/2017  . Bilateral primary osteoarthritis of knee 10/03/2017  . 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 Malignant neoplasm of upper-inner quadrant of left breast in female, estrogen receptor positive ([place]) 05/22/2017  . Hirsutism 11/24/2015  . Hyperlipidemia 02/01/2014  . Impaired glucose tolerance 02/01/2014  . Vitamin D deficiency 02/01/2014  . Family history of diabetes mellitus 01/31/2014  . Morbid obesity with BMI of 40.0-44.9, adult ([place]) 01/31/2014  . Sjogren's syndrome ([place]) 01/17/2014    Social History   Tobacco Use  . Smoking status: Former Smoker    Years: 7.00    Types: Cigarettes    Quit date: 08/13/1979  Years since quitting: 40.6  . Smokeless tobacco: Never Used  . Tobacco comment: AGE [age]  Substance Use Topics  . Alcohol use: No     Current Outpatient Medications:  .  acetaminophen (TYLENOL) 500 MG tablet, Take 1,000 mg by mouth every 8 (eight) hours as needed for mild pain or headache., Disp: , Rfl:  .  Ca Carbonate-Mag Hydroxide (ROLAIDS PO), Take 1 tablet as needed by mouth (indigestion). , Disp: , Rfl:  .   cholecalciferol (VITAMIN D) 1000 units tablet, Take 2,000 Units by mouth daily., Disp: , Rfl:  .  doxycycline (ADOXA) 100 MG tablet, Take 100 mg by mouth daily., Disp: , Rfl:  .  ketoconazole (NIZORAL) 2 % shampoo, Apply 1 application topically 2 (two) times a week., Disp: 120 mL, Rfl: 3 .  LORazepam (ATIVAN) 0.5 MG tablet, , Disp: , Rfl: 0 .  meloxicam (MOBIC) 15 MG tablet, Take 0.5-1 tablets (7.5-15 mg total) by mouth daily. For chronic joint pain and arthritis, Disp: 90 tablet, Rfl: 3 .  prednisoLONE acetate (PRED FORTE) 1 % ophthalmic suspension, Administer 1 drop into the left eye Four (4) times a day., Disp: , Rfl:  .  predniSONE (STERAPRED UNI-PAK 21 TAB) 5 MG (21) TBPK tablet, Take 5 mg by mouth daily., Disp: , Rfl:  .  tamoxifen (NOLVADEX) 20 MG tablet, Take 1 tablet by mouth once daily, Disp: 90 tablet, Rfl: 1 .  tizanidine (ZANAFLEX) 2 MG capsule, Take 1 capsule (2 mg total) by mouth 3 (three) times daily as needed for muscle spasms., Disp: 30 capsule, Rfl: 2 .  venlafaxine XR (EFFEXOR-XR) 150 MG 24 hr capsule, , Disp: , Rfl:  .  vitamin k 100 MCG tablet, Take 100 mcg by mouth 2 (two) times a week., Disp: , Rfl:   Allergies  Allergen Reactions  . Rituximab Anaphylaxis  . Benadryl [Diphenhydramine] Other (See Comments)    Patient felt like she was going to "crawl out of her skin"  . Cortisone Other (See Comments)    Also reacts to cortisone injections; they make her flushed  . Diphenhydramine Hcl   . Clonazepam Anxiety  . Sulfamethoxazole-Trimethoprim Rash    Chest tightness. Bactrim    I personally reviewed active problem list, medication list, allergies, family history, social history, health maintenance, notes from last encounter, lab results, imaging with the patient/caregiver today.   Review of Systems  10 Systems reviewed and are negative for acute change except as noted in the HPI.    Objective:   Virtual encounter, vitals limited, only able to obtain the  following Today's Vitals   03/29/20 0845  Weight: 223 lb (101.2 kg)  Height: 5' (1.524 m)   Body mass index is 43.55 kg/m. Nursing Note and Vital Signs reviewed.  Physical Exam Vitals and nursing note reviewed.  Constitutional:      General: She is not in acute distress.    Appearance: Normal appearance. She is well-developed. She is obese. She is not toxic-appearing or diaphoretic.  HENT:     Head: Normocephalic and atraumatic.  Neck:     Trachea: No tracheal deviation.  Cardiovascular:     Rate and Rhythm: Normal rate.  Pulmonary:     Effort: Pulmonary effort is normal. No respiratory distress.     Breath sounds: No stridor.     Comments: No observed tachypnea, respiratory distress, retractions or accessory muscle use Able to speak in full and complete sentences Skin:    Coloration: Skin is not jaundiced or  pale.     Findings: No rash.  Neurological:     Mental Status: She is alert.  Psychiatric:        Mood and Affect: Mood normal.        Behavior: Behavior normal.     PE limited by telephone encounter  No results found for this or any previous visit (from the past 72 hour(s)).  Assessment and Plan:     ICD-10-CM   1. Suspected COVID-19 virus infection  Z20.822    close exposure - son in her home, sx onset 3 d ago, multiple comorbidities  2. Sjogren's syndrome, with unspecified organ involvement ([place])  M35.00   3. Class 2 severe obesity with serious comorbidity and body mass index (BMI) of 36.0 to 36.9 in adult, unspecified obesity type ([place])  E66.01    Z68.36   4. Hyperlipidemia, unspecified hyperlipidemia type  E78.5   5. Long-term current use of tamoxifen  Z79.810   6. Immunocompromised ([place])  D84.9    due to long term prednisone use - she is in the middle of a long taper, encouraged her to continue taper pred 10 to 5 mg, f/up if any worsening sx     ER precautions reviewed with pt - including CP, SOB, tachycardia, or severe eye pain or change in vision  with her eye hx and current tx and inability to see ophthomology  Pt currently has stable VS - suspected to have COVID, but testing not yet confirmed.   If she has positive test, I think she would be a candidate for outpt infusion tx. She does plan to go to [facility] ER if she has any worsening sx.  Pt to quarantine with suspected COVID for at least 10 d from sx onset, until sx improve and fever free.    I provided 30+ minutes of non-face-to-face time during this encounter. Greater than 50% of this visit was spent in direct virtually with patient- counseling, obtaining history and physical, discussing and educating pt on treatment plan.  Total time of this visit was 35+ min, more than 20 spent with pt on virtual video and then additional 11 min on phone.  Remainder of time involved but was not limited to reviewing chart (recent and pertinent OV notes and labs), documentation in EMR, and coordinating care and treatment plan.  Delsa Grana, PA-C 03/29/20 9:10 AM

## 2020-03-30 DIAGNOSIS — Z20822 Contact with and (suspected) exposure to covid-19: Secondary | ICD-10-CM | POA: Diagnosis not present

## 2020-04-04 ENCOUNTER — Telehealth: Payer: Self-pay

## 2020-04-04 ENCOUNTER — Telehealth: Payer: Self-pay | Admitting: Emergency Medicine

## 2020-04-04 DIAGNOSIS — U071 COVID-19: Secondary | ICD-10-CM | POA: Diagnosis not present

## 2020-04-04 DIAGNOSIS — R5383 Other fatigue: Secondary | ICD-10-CM | POA: Diagnosis not present

## 2020-04-04 DIAGNOSIS — Z6841 Body Mass Index (BMI) 40.0 and over, adult: Secondary | ICD-10-CM | POA: Diagnosis not present

## 2020-04-04 NOTE — Telephone Encounter (Signed)
Copied from [place] [phone]. Topic: General - Other >> Apr 03, 2020 11:05 AM Leward Quan A wrote: Reason for CRM: Patient called to infrom Delsa Grana that she did test positive for Covid and would like to discuss further about the infusion therapy.  Ph# [phone]

## 2020-04-04 NOTE — Telephone Encounter (Signed)
Spoke with pt and she no longer need appointment. The closest infusion place is over on hr away. And she is going on her 10th day.

## 2020-04-04 NOTE — Telephone Encounter (Signed)
Spoke with patient she says she has been diagnosed with Covid, her symptoms are nausea and she feels awful. She has a virtual visit with a covid doctor today. She wants to know if you would send her some anti nausea medication?   Copied from [place] [phone]. Topic: General - Other >> Mar 31, 2020  1:43 PM Hinda Lenis D wrote: PT has covid symptoms and would like to speak with a nurse / please advise

## 2020-04-04 NOTE — Telephone Encounter (Signed)
Please schedule virtual appointment  °

## 2020-04-05 NOTE — Telephone Encounter (Signed)
Need appointment

## 2020-04-05 NOTE — Telephone Encounter (Signed)
Spoke with pt and she got the medication from a covid doctor.

## 2020-04-06 DIAGNOSIS — F411 Generalized anxiety disorder: Secondary | ICD-10-CM | POA: Diagnosis not present

## 2020-04-06 DIAGNOSIS — H9319 Tinnitus, unspecified ear: Secondary | ICD-10-CM | POA: Diagnosis not present

## 2020-04-06 DIAGNOSIS — F410 Panic disorder [episodic paroxysmal anxiety] without agoraphobia: Secondary | ICD-10-CM | POA: Diagnosis not present

## 2020-04-11 ENCOUNTER — Encounter: Payer: Self-pay | Admitting: Family Medicine

## 2020-04-19 DIAGNOSIS — E0520 Thyrotoxicosis with toxic multinodular goiter without thyrotoxic crisis or storm: Secondary | ICD-10-CM | POA: Diagnosis not present

## 2020-04-24 NOTE — Progress Notes (Signed)
Name: Sheena Martinez   MRN: [id number]    DOB: 18-Dec-1960   Date:04/25/2020       Progress Note  Chief Complaint  Patient presents with  . Obesity  . Hyperlipidemia     Subjective:   Sheena Martinez is a 59 y.o. female, presents to clinic for routine f/up  Hyperlipidemia: Currently treated with no current meds, pt reports good  med compliance Last Lipids: Lab Results  Component Value Date   CHOL 201 (H) 02/04/2019   HDL 50 02/04/2019   LDLCALC 125 (H) 02/04/2019   TRIG 143 02/04/2019   CHOLHDL 4.0 02/04/2019   - Denies: Chest pain, shortness of breath, myalgias, claudication   Weight/nutrition - lost and gained 50 lbs over and over again for years She has tried intermittent fasting, cutting out junk food She has been on steroids recently for Sjogrens and eye complications She has history of impaired glucose tolerance, family history of diabetes, hyperlipidemia There is a lot of family staying with her right now and they are about to move out so she is hoping to make some changes when they are moving out  COVID one month ago - still having fatigue and body aches - no respiratory sx.  Mild vitamin D deficiency in the past Last vitamin D Lab Results  Component Value Date   VD25OH 24 (L) 06/29/2018      Current Outpatient Medications:  .  acetaminophen (TYLENOL) 500 MG tablet, Take 1,000 mg by mouth every 8 (eight) hours as needed for mild pain or headache., Disp: , Rfl:  .  Ca Carbonate-Mag Hydroxide (ROLAIDS PO), Take 1 tablet as needed by mouth (indigestion). , Disp: , Rfl:  .  cholecalciferol (VITAMIN D) 1000 units tablet, Take 2,000 Units by mouth daily., Disp: , Rfl:  .  doxycycline (ADOXA) 100 MG tablet, Take 100 mg by mouth daily., Disp: , Rfl:  .  ketoconazole (NIZORAL) 2 % shampoo, Apply 1 application topically 2 (two) times a week., Disp: 120 mL, Rfl: 3 .  LORazepam (ATIVAN) 0.5 MG tablet, , Disp: , Rfl: 0 .  meloxicam (MOBIC) 15 MG tablet, Take 0.5-1  tablets (7.5-15 mg total) by mouth daily. For chronic joint pain and arthritis, Disp: 90 tablet, Rfl: 3 .  prednisoLONE acetate (PRED FORTE) 1 % ophthalmic suspension, Administer 1 drop into the left eye Four (4) times a day., Disp: , Rfl:  .  tamoxifen (NOLVADEX) 20 MG tablet, Take 1 tablet by mouth once daily, Disp: 90 tablet, Rfl: 1 .  tizanidine (ZANAFLEX) 2 MG capsule, Take 1 capsule (2 mg total) by mouth 3 (three) times daily as needed for muscle spasms., Disp: 30 capsule, Rfl: 2 .  venlafaxine XR (EFFEXOR-XR) 150 MG 24 hr capsule, , Disp: , Rfl:  .  vitamin k 100 MCG tablet, Take 100 mcg by mouth 2 (two) times a week., Disp: , Rfl:  .  predniSONE (STERAPRED UNI-PAK 21 TAB) 5 MG (21) TBPK tablet, Take 5 mg by mouth daily. (Patient not taking: Reported on 04/25/2020), Disp: , Rfl:   Patient Active Problem List   Diagnosis Date Noted  . Post-menopausal bleeding 10/25/2019  . Osteopenia 03/18/2019  . Facial flushing 10/21/2018  . Tinnitus of both ears 10/21/2018  . Drug-induced neutropenia ([place]) 10/20/2018  . 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 Thrombocytosis ([place]) 10/20/2018  . 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 Irregular menstrual cycle 03/02/2018  . Screening for cervical cancer 03/02/2018  .  Long-term current use of tamoxifen 02/17/2018  . 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 Anxiety 10/03/2017  . Bilateral primary osteoarthritis of knee 10/03/2017  . 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 Malignant neoplasm of upper-inner quadrant of left breast in female, estrogen receptor positive ([place]) 05/22/2017  . Hirsutism 11/24/2015  . Hyperlipidemia 02/01/2014  . Impaired glucose tolerance 02/01/2014  . Vitamin D deficiency 02/01/2014  . Family history of diabetes mellitus 01/31/2014  . Morbid obesity with BMI of 40.0-44.9, adult ([place]) 01/31/2014  . Sjogren's syndrome ([place]) 01/17/2014    Past Surgical History:    Procedure Laterality Date  . BREAST BIOPSY Left 05/12/2017   [place] bx/ INVASIVE MAMMARY CARCINOMA  . BREAST LUMPECTOMY Left 05/2017   invasive mammary carcinoma, neg margins  . BREAST LUMPECTOMY WITH SENTINEL LYMPH NODE BIOPSY Left 06/10/2017   Procedure: BREAST LUMPECTOMY WITH SENTINEL LYMPH NODE BX AND NEEDLE LOCALIZATION;  Surgeon: Robert Bellow, MD;  Location: [facility] ORS;  Service: General;  Laterality: Left;  . BREAST MAMMOSITE Left 06/24/2017   Procedure: MAMMOSITE BREAST;  Surgeon: Robert Bellow, MD;  Location: [facility] ORS;  Service: General;  Laterality: Left;  . CESAREAN SECTION  1995  . 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 COSMETIC SURGERY    . CYST EXCISION  2018   pilar cyst/ Dr Will Bonnet  . EYE SURGERY    . PORTACATH PLACEMENT Right 07/08/2017   Procedure: INSERTION PORT-A-CATH;  Surgeon: Robert Bellow, MD;  Location: [facility] ORS;  Service: General;  Laterality: Right;  . TONSILLECTOMY      Family History  Problem Relation Age of Onset  . Breast cancer Maternal Aunt 54       currently ~65  . Diabetes Mother   . Skin cancer Mother        currently 67; TAH/BSO (age?)  . Anemia Mother   . Liver disease Father        deceased / not much info about him / alcoholic  . Lung cancer Paternal Aunt        smoker / deceased 12s  . Stomach cancer Paternal Uncle 77       deceased / deceased 22s  . Ovarian cancer Maternal Grandmother 65       deceased 91s  . Stomach cancer Paternal Grandfather 42       deceased 62s  . Cancer Maternal Uncle        unk. type; deceased 71s  . Leukemia Cousin 10       deceased 17    Social History   Tobacco Use  . Smoking status: Former Smoker    Years: 7.00    Types: Cigarettes    Quit date: 08/13/1979    Years since quitting: 40.7  . Smokeless tobacco: Never Used  . Tobacco comment: AGE 88-19  Vaping Use  . Vaping Use: Never used  Substance Use Topics  . Alcohol use: No  . Drug use: No     Allergies  Allergen Reactions  .  Rituximab Anaphylaxis  . Benadryl [Diphenhydramine] Other (See Comments)    Patient felt like she was going to "crawl out of her skin"  . Cortisone Other (See Comments)    Also reacts to cortisone injections; they make her flushed  . Diphenhydramine Hcl   . Clonazepam Anxiety  . Sulfamethoxazole-Trimethoprim Rash    Chest tightness. Bactrim    Health Maintenance  Topic Date Due  . COVID-19 Vaccine (1) Never done  . INFLUENZA VACCINE  Never done  . PAP SMEAR-Modifier  03/02/2021  . MAMMOGRAM  05/05/2021  . COLONOSCOPY  12/14/2027  . TETANUS/TDAP  07/21/2028  . Hepatitis C Screening  Completed  . HIV Screening  Completed    Chart Review Today: I personally reviewed active problem list, medication list, allergies, family history, social history, health maintenance, notes from last encounter, lab results, imaging with the patient/caregiver today.   Review of Systems  10 Systems reviewed and are negative for acute change except as noted in the HPI.  Objective:   Vitals:   04/25/20 1307  BP: 118/76  Pulse: 100  Resp: 16  Temp: 98.2 F (36.8 C)  TempSrc: Oral  SpO2: 98%  Weight: 222 lb 4.8 oz (100.8 kg)  Height: 5' (1.524 m)    Body mass index is 43.41 kg/m.  Physical Exam Vitals and nursing note reviewed.  Constitutional:      General: She is not in acute distress.    Appearance: Normal appearance. She is obese. She is not ill-appearing, toxic-appearing or diaphoretic.  HENT:     Head: Normocephalic and atraumatic.     Right Ear: External ear normal.     Left Ear: External ear normal.  Cardiovascular:     Rate and Rhythm: Normal rate and regular rhythm.     Pulses: Normal pulses.     Heart sounds: Normal heart sounds. No murmur heard.  No friction rub. No gallop.   Pulmonary:     Effort: Pulmonary effort is normal. No respiratory distress.     Breath sounds: Normal breath sounds. No wheezing or rales.  Abdominal:     General: Bowel sounds are normal.      Palpations: Abdomen is soft.  Skin:    General: Skin is warm and dry.     Capillary Refill: Capillary refill takes less than 2 seconds.     Coloration: Skin is not jaundiced or pale.  Neurological:     Mental Status: She is alert.     Gait: Gait abnormal.  Psychiatric:        Mood and Affect: Mood normal.        Behavior: Behavior normal.         Assessment & Plan:   1. Hyperlipidemia, unspecified hyperlipidemia type Not on medications managing with diet lifestyle however she is gained some weight recheck labs today discussed monitoring, ASCVD risk score calculation, medications, diet lifestyle efforts - Lipid panel - COMPLETE METABOLIC PANEL WITH GFR  2. Morbid obesity with BMI of 40.0-44.9, adult ([place]) Encourage her to start documenting and working on observing her calorie intake, work on portion reduction and calorie reduction, increasing healthy foods exercising as able - Lipid panel - COMPLETE METABOLIC PANEL WITH GFR - Hemoglobin A1c - Amb Ref to Medical Weight Management  3. Hypocalcemia History of low vitamin D and low calcium we will recheck labs today - COMPLETE METABOLIC PANEL WITH GFR - Vitamin D (25 hydroxy) - VITAMIN D 25 Hydroxy (Vit-D Deficiency, Fractures)  4. Vitamin D deficiency - COMPLETE METABOLIC PANEL WITH GFR - Vitamin D (25 hydroxy) - VITAMIN D 25 Hydroxy (Vit-D Deficiency, Fractures)  5. Bilateral primary osteoarthritis of knee Increased knee pain with known osteoarthritis, has gotten worse with weight fluctuations, currently managing with over-the-counter medications  6. Osteopenia, unspecified location Reviewed and discussed vitamin D and calcium supplementation and other lifestyle and exercise efforts that can help strengthen bones - COMPLETE METABOLIC PANEL WITH GFR  7. Impaired glucose tolerance - COMPLETE METABOLIC PANEL  WITH GFR - Hemoglobin A1c  8. Sjogren's syndrome, with unspecified organ involvement [facility]) Per specialist  9.  Chronic bilateral low back pain without sciatica Limiting physical activity making weight loss difficult  10. Immunocompromised ([place]) Discussed Covid and vaccines encouraged her to get at least 1 vaccine as a booster with her current medications and her past and current medical history  11. Encounter for medication monitoring - Lipid panel - COMPLETE METABOLIC PANEL WITH GFR - Hemoglobin A1c - CBC with Differential/Platelet    Delsa Grana, PA-C 04/25/20 1:08 PM

## 2020-04-25 ENCOUNTER — Ambulatory Visit (INDEPENDENT_AMBULATORY_CARE_PROVIDER_SITE_OTHER): Payer: Medicaid Other | Admitting: Family Medicine

## 2020-04-25 ENCOUNTER — Other Ambulatory Visit: Payer: Self-pay

## 2020-04-25 ENCOUNTER — Encounter: Payer: Self-pay | Admitting: Family Medicine

## 2020-04-25 VITALS — BP 118/76 | HR 100 | Temp 98.2°F | Resp 16 | Ht 60.0 in | Wt 222.3 lb

## 2020-04-25 DIAGNOSIS — E559 Vitamin D deficiency, unspecified: Secondary | ICD-10-CM

## 2020-04-25 DIAGNOSIS — D849 Immunodeficiency, unspecified: Secondary | ICD-10-CM

## 2020-04-25 DIAGNOSIS — M8580 Other specified disorders of bone density and structure, unspecified site: Secondary | ICD-10-CM | POA: Diagnosis not present

## 2020-04-25 DIAGNOSIS — M3500 Sicca syndrome, unspecified: Secondary | ICD-10-CM

## 2020-04-25 DIAGNOSIS — M545 Low back pain, unspecified: Secondary | ICD-10-CM

## 2020-04-25 DIAGNOSIS — R7302 Impaired glucose tolerance (oral): Secondary | ICD-10-CM | POA: Diagnosis not present

## 2020-04-25 DIAGNOSIS — G8929 Other chronic pain: Secondary | ICD-10-CM

## 2020-04-25 DIAGNOSIS — E785 Hyperlipidemia, unspecified: Secondary | ICD-10-CM

## 2020-04-25 DIAGNOSIS — Z5181 Encounter for therapeutic drug level monitoring: Secondary | ICD-10-CM

## 2020-04-25 DIAGNOSIS — M170 Bilateral primary osteoarthritis of knee: Secondary | ICD-10-CM | POA: Diagnosis not present

## 2020-04-25 DIAGNOSIS — Z6841 Body Mass Index (BMI) 40.0 and over, adult: Secondary | ICD-10-CM | POA: Diagnosis not present

## 2020-04-25 NOTE — Patient Instructions (Addendum)
Ask endocrinology if they think you would be able to take a GLP1-inhibitor  Make sure you are taking a daily calcium supplement with Vit D   1200 mg calcium and at least 2000 IU vit D - you can find them together over the counter - usually to keep the Vit D levels higher you will probably have to have two supplements  Preventing Osteoporosis, Adult Osteoporosis is a condition that causes the bones to lose density. This means that the bones become thinner, and the normal spaces in bone tissue become larger. Low bone density can make the bones weak and cause them to break more easily. Osteoporosis cannot always be prevented, but you can take steps to lower your risk of developing this condition. How can this condition affect me? If you develop osteoporosis, you will be more likely to break bones in your wrist, spine, or hip. Even a minor accident or injury can be enough to break weak bones. The bones will also be slower to heal. Osteoporosis can cause other problems as well, such as a stooped posture or trouble with movement. Osteoporosis can occur with aging. As you get older, you may lose bone tissue more quickly, or it may be replaced more slowly. Osteoporosis is more likely to develop if you have poor nutrition or do not get enough calcium or vitamin D. Other lifestyle factors can also play a role. By eating a well-balanced diet and making lifestyle changes, you can help keep your bones strong and healthy, lowering your chances of developing osteoporosis. What can increase my risk? The following factors may make you more likely to develop osteoporosis:  Having a family history of the condition.  Having poor nutrition or not getting enough calcium or vitamin D.  Using certain medicines, such as steroid medicines or antiseizure medicines.  Being any of the following: ? 40 years of age or older. ? Female. ? A woman who has gone through menopause (is postmenopausal). ? White (Caucasian) or of  Asian descent.  Smoking or having a history of smoking.  Not being physically active (being sedentary).  Having a small body frame. What actions can I take to prevent this?  Get enough calcium   Make sure you get enough calcium every day. Calcium is the most important mineral for bone health. Most people can get enough calcium from their diet, but supplements may be recommended for people who are at risk for osteoporosis. Follow these guidelines: ? If you are age 44 or younger, aim to get 1,000 mg of calcium every day. ? If you are older than age 74, aim to get 1,200 mg of calcium every day.  Good sources of calcium include: ? Dairy products, such as low-fat or nonfat milk, cheese, and yogurt. ? Dark green leafy vegetables, such as bok choy and broccoli. ? Foods that have had calcium added to them (calcium-fortified foods), such as orange juice, cereal, bread, soy beverages, and tofu products. ? Nuts, such as almonds.  Check nutrition labels to see how much calcium is in a food or drink. Get enough vitamin D  Try to get enough vitamin D every day. Vitamin D is the most essential vitamin for bone health. It helps the body absorb calcium. Follow these guidelines for how much vitamin D to get from food: ? If you are age 52 or younger, aim to get at least 600 international units (IU) every day. Your health care provider may suggest more. ? If you are older than age  70, aim to get at least 800 international units every day. Your health care provider may suggest more.  Good sources of vitamin D in your diet include: ? Egg yolks. ? Oily fish, such as salmon, sardines, and tuna. ? Milk and cereal fortified with vitamin D.  Your body also makes vitamin D when you are out in the sun. Exposing the bare skin on your face, arms, legs, or back to the sun for no more than 30 minutes a day, 2 times a week is more than enough. Beyond that, make sure you use sunblock to protect your skin from  sunburn, which increases your risk for skin cancer. Exercise  Stay active and get exercise every day.  Ask your health care provider what types of exercise are best for you. Weight-bearing and strength-building activities are important for building and maintaining healthy bones. Some examples of these types of activities include: ? Walking and hiking. ? Jogging and running. ? Dancing. ? Gym exercises. ? Lifting weights. ? Tennis and racquetball. ? Climbing stairs. ? Aerobics. Make other lifestyle changes  Do not use any products that contain nicotine or tobacco, such as cigarettes, e-cigarettes, and chewing tobacco. If you need help quitting, ask your health care provider.  Lose weight if you are overweight.  If you drink alcohol: ? Limit how much you use to:  0-1 drink a day for nonpregnant women.  0-2 drinks a day for men. ? Be aware of how much alcohol is in your drink. In the [place] one drink equals one 12 oz bottle of beer (355 mL), one 5 oz glass of wine (148 mL), or one 1 oz glass of hard liquor (44 mL). Where to find support If you need help making changes to prevent osteoporosis, talk with your health care provider. You can ask for a referral to a diet and nutrition specialist (dietitian) and a physical therapist. Where to find more information Learn more about osteoporosis from:  NIH Osteoporosis and Related [place]: www.bones.SouthExposed.es  [place]. Office on [employer] Health: [place]s.tn  [place]: EquipmentWeekly.com.ee Summary  Osteoporosis is a condition that causes weak bones that are more likely to break.  Eat a healthy diet, making sure you get enough calcium and vitamin D, and stay active by getting regular exercise to help prevent osteoporosis.  Other ways to reduce your risk of osteoporosis include maintaining a healthy weight and avoiding alcohol and products that contain nicotine or tobacco. This information  is not intended to replace advice given to you by your health care provider. Make sure you discuss any questions you have with your health care provider. Document Revised: 02/26/2019 Document Reviewed: 02/26/2019 Elsevier Patient Education  [place].   Preventing High Cholesterol Cholesterol is a white, waxy substance similar to fat that the human body needs to help build cells. The liver makes all the cholesterol that a person's body needs. Having high cholesterol (hypercholesterolemia) increases a person's risk for heart disease and stroke. Extra (excess) cholesterol comes from the food the person eats. High cholesterol can often be prevented with diet and lifestyle changes. If you already have high cholesterol, you can control it with diet and lifestyle changes and with medicine. How can high cholesterol affect me? If you have high cholesterol, deposits (plaques) may build up on the walls of your arteries. The arteries are the blood vessels that carry blood away from your heart. Plaques make the arteries narrower and stiffer. This can limit or block  blood flow and cause blood clots to form. Blood clots:  Are tiny balls of cells that form in your blood.  Can move to the heart or brain, causing a heart attack or stroke. Plaques in arteries greatly increase your risk for heart attack and stroke.Making diet and lifestyle changes can reduce your risk for these conditions that may threaten your life. What can increase my risk? This condition is more likely to develop in people who:  Eat foods that are high in saturated fat or cholesterol. Saturated fat is mostly found in: ? Foods that contain animal fat, such as red meat and some dairy products. ? Certain fatty foods made from plants, such as tropical oils.  Are overweight.  Are not getting enough exercise.  Have a family history of high cholesterol. What actions can I take to prevent this? Nutrition   Eat less saturated  fat.  Avoid trans fats (partially hydrogenated oils). These are often found in margarine and in some baked goods, fried foods, and snacks bought in packages.  Avoid precooked or cured meat, such as sausages or meat loaves.  Avoid foods and drinks that have added sugars.  Eat more fruits, vegetables, and whole grains.  Choose healthy sources of protein, such as fish, poultry, lean cuts of red meat, beans, peas, lentils, and nuts.  Choose healthy sources of fat, such as: ? Nuts. ? Vegetable oils, especially olive oil. ? Fish that have healthy fats (omega-3 fatty acids), such as mackerel or salmon. The items listed above may not be a complete list of recommended foods and beverages. Contact a dietitian for more information. Lifestyle  Lose weight if you are overweight. Losing 5-10 lb (2.3-4.5 kg) can help prevent or control high cholesterol. It can also lower your risk for diabetes and high blood pressure. Ask your health care provider to help you with a diet and exercise plan to lose weight safely.  Do not use any products that contain nicotine or tobacco, such as cigarettes, e-cigarettes, and chewing tobacco. If you need help quitting, ask your health care provider.  Limit your alcohol intake. ? Do not drink alcohol if:  Your health care provider tells you not to drink.  You are pregnant, may be pregnant, or are planning to become pregnant. ? If you drink alcohol:  Limit how much you use to:  0-1 drink a day for women.  0-2 drinks a day for men.  Be aware of how much alcohol is in your drink. In the [place] one drink equals one 12 oz bottle of beer (355 mL), one 5 oz glass of wine (148 mL), or one 1 oz glass of hard liquor (44 mL). Activity   Get enough exercise. Each week, do at least 150 minutes of exercise that takes a medium level of effort (moderate-intensity exercise). ? This is exercise that:  Makes your heart beat faster and makes you breathe harder than  usual.  Allows you to still be able to talk. ? You could exercise in short sessions several times a day or longer sessions a few times a week. For example, on 5 days each week, you could walk fast or ride your bike 3 times a day for 10 minutes each time.  Do exercises as told by your health care provider. Medicines  In addition to diet and lifestyle changes, your health care provider may recommend medicines to help lower cholesterol. This may be a medicine to lower the amount of cholesterol your liver makes. You may  need medicine if: ? Diet and lifestyle changes do not lower your cholesterol enough. ? You have high cholesterol and other risk factors for heart disease or stroke.  Take over-the-counter and prescription medicines only as told by your health care provider. General information  Manage your risk factors for high cholesterol. Talk with your health care provider about all your risk factors and how to lower your risk.  Manage other conditions that you have, such as diabetes or high blood pressure (hypertension).  Have blood tests to check your cholesterol levels at regular points in time as told by your health care provider.  Keep all follow-up visits as told by your health care provider. This is important. Where to find more information  American Heart Association: www.heart.org  National Heart, Lung, and Blood Institute: https://wilson-eaton.com/ Summary  High cholesterol increases your risk for heart disease and stroke. By keeping your cholesterol level low, you can reduce your risk for these conditions.  High cholesterol can often be prevented with diet and lifestyle changes.  Work with your health care provider to manage your risk factors, and have your blood tested regularly. This information is not intended to replace advice given to you by your health care provider. Make sure you discuss any questions you have with your health care provider. Document Revised: 11/20/2018  Document Reviewed: 04/06/2016 Elsevier Patient Education  2020 [employer].

## 2020-04-26 DIAGNOSIS — E0520 Thyrotoxicosis with toxic multinodular goiter without thyrotoxic crisis or storm: Secondary | ICD-10-CM | POA: Diagnosis not present

## 2020-04-26 DIAGNOSIS — E042 Nontoxic multinodular goiter: Secondary | ICD-10-CM | POA: Diagnosis not present

## 2020-04-26 LAB — HEMOGLOBIN A1C
Hgb A1c MFr Bld: 5.7 % of total Hgb — ABNORMAL HIGH (ref <5.7)
Mean Plasma Glucose: 117 (calc)
eAG (mmol/L): 6.5 (calc)

## 2020-04-26 LAB — CBC WITH DIFFERENTIAL/PLATELET
Absolute Monocytes: 595 cells/uL (ref 200–950)
Basophils Absolute: 10 cells/uL (ref 0–200)
Basophils Relative: 0.2 %
Eosinophils Absolute: 50 cells/uL (ref 15–500)
Eosinophils Relative: 1 %
HCT: 40 % (ref 35.0–45.0)
Hemoglobin: 13.3 g/dL (ref 11.7–15.5)
Lymphs Abs: 1760 cells/uL (ref 850–3900)
MCH: 32.2 pg (ref 27.0–33.0)
MCHC: 33.3 g/dL (ref 32.0–36.0)
MCV: 96.9 fL (ref 80.0–100.0)
MPV: 9.7 fL (ref 7.5–12.5)
Monocytes Relative: 11.9 %
Neutro Abs: 2585 cells/uL (ref 1500–7800)
Neutrophils Relative %: 51.7 %
Platelets: 280 10*3/uL (ref 140–400)
RBC: 4.13 10*6/uL (ref 3.80–5.10)
RDW: 13.1 % (ref 11.0–15.0)
Total Lymphocyte: 35.2 %
WBC: 5 10*3/uL (ref 3.8–10.8)

## 2020-04-26 LAB — COMPLETE METABOLIC PANEL WITH GFR
AG Ratio: 1.6 (calc) (ref 1.0–2.5)
ALT: 13 U/L (ref 6–29)
AST: 14 U/L (ref 10–35)
Albumin: 3.8 g/dL (ref 3.6–5.1)
Alkaline phosphatase (APISO): 72 U/L (ref 37–153)
BUN: 14 mg/dL (ref 7–25)
CO2: 27 mmol/L (ref 20–32)
Calcium: 9.2 mg/dL (ref 8.6–10.4)
Chloride: 107 mmol/L (ref 98–110)
Creat: 0.73 mg/dL (ref 0.50–1.05)
GFR, Est African American: 104 mL/min/{1.73_m2} (ref >=60)
GFR, Est Non African American: 90 mL/min/{1.73_m2} (ref >=60)
Globulin: 2.4 g/dL (calc) (ref 1.9–3.7)
Glucose, Bld: 86 mg/dL (ref 65–99)
Potassium: 4.3 mmol/L (ref 3.5–5.3)
Sodium: 142 mmol/L (ref 135–146)
Total Bilirubin: 0.4 mg/dL (ref 0.2–1.2)
Total Protein: 6.2 g/dL (ref 6.1–8.1)

## 2020-04-26 LAB — VITAMIN D 25 HYDROXY (VIT D DEFICIENCY, FRACTURES): Vit D, 25-Hydroxy: 29 ng/mL — ABNORMAL LOW (ref 30–100)

## 2020-04-26 LAB — LIPID PANEL
Cholesterol: 207 mg/dL — ABNORMAL HIGH (ref <200)
HDL: 49 mg/dL — ABNORMAL LOW (ref >=50)
LDL Cholesterol (Calc): 129 mg/dL (calc) — ABNORMAL HIGH
Non-HDL Cholesterol (Calc): 158 mg/dL (calc) — ABNORMAL HIGH (ref <130)
Total CHOL/HDL Ratio: 4.2 (calc) (ref <5.0)
Triglycerides: 176 mg/dL — ABNORMAL HIGH (ref <150)

## 2020-05-03 DIAGNOSIS — M170 Bilateral primary osteoarthritis of knee: Secondary | ICD-10-CM | POA: Diagnosis not present

## 2020-05-08 ENCOUNTER — Ambulatory Visit
Admission: RE | Admit: 2020-05-08 | Discharge: 2020-05-08 | Disposition: A | Discharge status: Home / Self Care | Payer: Medicaid Other | Source: Ambulatory Visit | Attending: Oncology | Admitting: Oncology

## 2020-05-08 ENCOUNTER — Other Ambulatory Visit: Payer: Self-pay

## 2020-05-08 DIAGNOSIS — Z170 Estrogen receptor positive status [ER+]: Secondary | ICD-10-CM | POA: Diagnosis present

## 2020-05-08 DIAGNOSIS — C50212 Malignant neoplasm of upper-inner quadrant of left female breast: Secondary | ICD-10-CM | POA: Insufficient documentation

## 2020-05-08 DIAGNOSIS — Z853 Personal history of malignant neoplasm of breast: Secondary | ICD-10-CM | POA: Diagnosis not present

## 2020-05-08 DIAGNOSIS — R928 Other abnormal and inconclusive findings on diagnostic imaging of breast: Secondary | ICD-10-CM | POA: Diagnosis not present

## 2020-05-09 DIAGNOSIS — M170 Bilateral primary osteoarthritis of knee: Secondary | ICD-10-CM | POA: Diagnosis not present

## 2020-05-16 DIAGNOSIS — Z853 Personal history of malignant neoplasm of breast: Secondary | ICD-10-CM | POA: Diagnosis not present

## 2020-05-17 ENCOUNTER — Other Ambulatory Visit: Payer: Self-pay

## 2020-05-17 ENCOUNTER — Encounter: Payer: Self-pay | Admitting: Dietician

## 2020-05-17 ENCOUNTER — Encounter: Payer: Medicaid Other | Attending: Family Medicine | Admitting: Dietician

## 2020-05-17 VITALS — Ht 60.0 in | Wt 225.9 lb

## 2020-05-17 DIAGNOSIS — M170 Bilateral primary osteoarthritis of knee: Secondary | ICD-10-CM | POA: Diagnosis not present

## 2020-05-17 DIAGNOSIS — Z6841 Body Mass Index (BMI) 40.0 and over, adult: Secondary | ICD-10-CM | POA: Diagnosis not present

## 2020-05-17 NOTE — Patient Instructions (Addendum)
   Portion control your carbs   2-3 serving at meals   1-2 servings at snacks   Keep up the great fluid intake

## 2020-05-17 NOTE — Progress Notes (Signed)
Medical Nutrition Therapy: Visit start time: 2778  end time: 1645  Assessment:  Diagnosis: obesity Past medical history: prediabetes, high cholesterol, sleep apnea, PCOS Psychosocial issues/ stress concerns: pt rates stress level as "high" and feels "not so well" about stress management   Preferred learning method:  . Auditory . Visual . Hands-on  Current weight: 225.9 lbs  Height: 5' Medications, supplements: reconciled in medical record  Labs HgA1c- 5.7(H) on 04/25/2020  Progress and evaluation:   Pt states she had breast cancer in 2018 treated with radiation and chemotherapy; pt attributes some of her other health complications to chemotherapy  Pt states weight was 175 lbs about five years ago after joining "[facility]" a christian-based weight loss program; pt reports the most helpful aspects of the program were the accountability and support via weekly check-ins   Pt reports she has tried time restricted intermittent fasting and that helped curb excessive snacking and promoted mild weight loss  Pt reports she tried the Atkins diet at one point and that was helpful initially but hard to maintain due to the restriction of the diet   Physical activity: ADLs  Dietary Intake:  Usual eating pattern includes 2-3 meals and 1-2 snacks per day. Dining out frequency: 7 meals per week.  Breakfast: 3/4 cup granola with 1/2 banana or peaches and almond milk  Snack: apples and PB with lite cool whip  Lunch: same as supper Supper: McD[employer]ds Big Mac and fries; red bowl stir fried rice; Mcannos; Subways; Wendy's or [employer] grilled chicken salad with low fat dressing at home; PB and Jelly sandwich Beverages: water, SF gingerale, 1 cup coffee with cream and sugar in the raw, diet pepsi/sprite    Nutrition Care Education: Basic nutrition: basic food groups, appropriate nutrient balance, appropriate meal and snack schedule, general nutrition guidelines    Weight control: importance  of low sugar and low fat choices, portion control strategies  Advanced nutrition: cooking techniques, dining out, food label reading Prediabetes:  appropriate meal and snack schedule, appropriate carb intake and balance, healthy carb choices, role of fiber, protein, fat Hyperlipidemia:  target goals for lipids, healthy and unhealthy fats, role of fiber, plant sterols, role of exercise Other lifestyle changes:  benefits of making changes, readiness for change  Nutritional Diagnosis:  [place]-3.3 Overweight/obesity As related to excess caloric intake and inadequate physcial activity.  As evidenced by pt BMI of 44.12.  Intervention:  Discussion and instruction as noted above.  Focused on how to find balance in meals and snacks that promote weight loss, curb cravings, and are sustainable lifestyle changes.  Established goals for additional change.    Increase fruit and vegetable intake   Incorporate vegetables at lunch and dinner   Incorporate more F/V at snacks  Do not skip breakfast, include whole fruit at breakfast   Try frozen veggies that come in microwaveable pouches (may be tender than fresh and low prep time)  Try canned fruit NOT in heavy syrup (mandarin oranges, peaches, pears, applesauce) for snacks + protein (cheese, PB)   Stress management (Follow up)  Recommend meeting with Pastoral Care  Increase quality of sleep, at follow up discuss link between sleep and weight management   At follow up, discuss Mindful Eating handout    Increase physical activity   Incrementally reintroduce exercise back into routine   150 minutes per week is recommendation   Try chair yoga or seated exercises on YouTube   Increase fiber intake   Eat at least 3  servings of whole grains a day  Increase fruit and vegetable intake  Maintain adequate fluid intake   Drink at least 64oz water daily   Decrease sodium intake   Reduce sodium intake to recommended 1552m/day   Read nutrition  labels on packaged foods to monitor sodium intake   400-600 mg/meal  <200 mg/snack   Decrease fast food frequency   Avoid processed meats    Decrease saturated fat intake   Try more plant-based sources of protein  Limit processed meats   Switch to low fat dairy products   Education Materials given:  . 10 healthy habit changes  . Plate Planner with food lists . My Food Record . Goals/ instructions  Learner/ who was taught:  . Patient   Level of understanding: .Marland KitchenVerbalizes/ demonstrates competency  Demonstrated degree of understanding via:   Teach back Learning barriers: . None  Willingness to learn/ readiness for change: . Eager, change in progress  Monitoring and Evaluation:  Dietary intake, exercise, A1c, and body weight      follow up: Wednesday, November 17 at 3:15pm

## 2020-05-24 DIAGNOSIS — E042 Nontoxic multinodular goiter: Secondary | ICD-10-CM | POA: Diagnosis not present

## 2020-05-25 DIAGNOSIS — F411 Generalized anxiety disorder: Secondary | ICD-10-CM | POA: Diagnosis not present

## 2020-05-25 DIAGNOSIS — F410 Panic disorder [episodic paroxysmal anxiety] without agoraphobia: Secondary | ICD-10-CM | POA: Diagnosis not present

## 2020-06-08 ENCOUNTER — Telehealth: Payer: Self-pay | Admitting: *Deleted

## 2020-06-08 NOTE — Telephone Encounter (Addendum)
Patient called reporting that she would like to go back on Paxil 20 mg daily stating she had better quality of life with that even with dose increases of the newer medications. Please advise

## 2020-06-08 NOTE — Telephone Encounter (Signed)
See my 11/18/2017 note. I had a discussion with her psyc dr at that time. Paxil is not recommend as it reduced efficacy of tamoxifen.

## 2020-06-08 NOTE — Telephone Encounter (Signed)
Called patient for clarification of her medication concerns.  Patient was previously on Paxil but Dr. Tasia Catchings advised she switch medication due to the interaction/contraindication of Paxil with Tamoxifen.  Her psychiatrist changed her to Effexor but since the change 3 years ago her anxiety is terrible and doesn't even want to get out of the bed in the morning.  Patient has talked to her psychiatrist regarding this issue and they advised her to discuss the possibility of changing back to Paxil while taking Tamoxifen.

## 2020-06-09 NOTE — Telephone Encounter (Signed)
Yes, patient is aware of your recommendation but she is asking if ok to change back to Paxil despite the reduced efficacy of Tamoxifen.  She was advised by psych to reach out to you for your opinion.

## 2020-06-12 NOTE — Telephone Encounter (Signed)
Psychiatrist recommend her to contact you to see if OK to go back on Paxil

## 2020-06-12 NOTE — Telephone Encounter (Signed)
Paxil is not recommend as it reduced efficacy of tamoxifen. she may proceed with Paxil if she understands her increased risk of cancer recurrence due to decreased efficacy of Tamoxifen. Does she want to discuss in person?

## 2020-06-13 ENCOUNTER — Other Ambulatory Visit: Payer: Self-pay | Admitting: Emergency Medicine

## 2020-06-13 DIAGNOSIS — R519 Headache, unspecified: Secondary | ICD-10-CM

## 2020-06-13 MED ORDER — KETOCONAZOLE 2 % EX SHAM: 1.0000 "application " | MEDICATED_SHAMPOO | CUTANEOUS | 3 refills | Status: DC

## 2020-06-13 NOTE — Telephone Encounter (Signed)
Left message for patient to return call.

## 2020-06-14 NOTE — Telephone Encounter (Signed)
Patient notified and does not think a visit is needed.  She is going to consider the risk vs benefits of taking Paxil with Tamoxifen and will discuss with her psychiatrist as well.

## 2020-06-28 ENCOUNTER — Other Ambulatory Visit: Payer: Self-pay

## 2020-06-28 ENCOUNTER — Encounter: Payer: Medicaid Other | Attending: Family Medicine | Admitting: Dietician

## 2020-06-28 DIAGNOSIS — Z6841 Body Mass Index (BMI) 40.0 and over, adult: Secondary | ICD-10-CM | POA: Diagnosis not present

## 2020-06-28 NOTE — Patient Instructions (Addendum)
·   When choosing frozen dinners look for   Saturated fat <5g   Sodium 600mg   Net Carbs <45g  Added Sugar <10g   Read up on the sleep hygiene   Keep up the great work!

## 2020-06-28 NOTE — Progress Notes (Signed)
Medical Nutrition Therapy: Visit start time: 1615  end time: 1715  Assessment:  Diagnosis: obesity Medical history changes: none Psychosocial issues/ stress concerns: pt provided with counseling resource  Current weight: 222.7 lbs  Height: 5' Medications, supplement changes: none  Progress and evaluation:   Pt weight down 3.2 lbs since initial visit  about 0.5 lbs/week which is healthy weight loss rate   Pt reports she has made some changes to her diet   Eating more greek yogurt, kind bars, and veggie burgers  More aware of snacking habits; pt notes pretzels and nuts in bulk packing lead to more snacking, will be switching to individually packaged snack bags   Pt states she and her husband are adopting a puppy on December 17; pt anticipates this will help with mood and activity level    Physical activity: ADLs, pt reports she may start walking more regularly once son moves in and she has someone to walk with also states walking dog once he is adopted will help to increase physical activity as well   Dietary Intake:  Usual eating pattern includes 3 meals and 1-2 snacks per day. Dining out frequency: decrease in dining out frequency  Breakfast: egg white souffles, coconut greek yogurt with granola and apple;   Lunch: chicken salad, pinwheel [place] Snack: smokehouse almonds, pretzels   Supper: grilled chicken salad; rice with veggie patty and steamed veggies;  Snack: german chocolate cake, couple pieces of halloween candy Beverages: water   Nutrition Care Education: Weight control: reviewed progress since previous visit, determining reasonable weight loss rate, portion control strategies Advanced nutrition:  recipe modification, cooking techniques, dining out, food label reading Prediabetes/PCOS: appropriate meal and snack schedule, appropriate carb intake and balance, healthy carb choices for lower glycemic response, role of fiber, protein Hypertension:  identifying high sodium  foods Hyperlipidemia: healthy and unhealthy fats Other lifestyle changes:  Measuring progress in multiple ways (not just weight on the scale)  Nutritional Diagnosis:  [place]-3.3 Overweight/obesity As related to history of excess caloric intake and inadequate physical activity.  As evidenced by pt BMI of 43.49.  Intervention:  Discussion and instruction as noted above.  Additionally, reviewed importance of quality sleep and stress management.  Provided pt with pastoral care information and will send referral.  Praised pt for amazing progress towards goals- lots of diet changes, mild weight loss.  Established additional goals for continued progress.   Increase fruit and vegetable intake   Incorporate vegetables at lunch and dinner   Incorporate more F/V at snacks  Try frozen veggies that come in microwaveable pouches (may be tender than fresh and low prep time)  Try canned fruit NOT in heavy syrup (mandarin oranges, peaches, pears, applesauce) for snacks + protein (cheese, PB)  Stress management   Set up virtual visit with Pastoral Care  Decrease screen time before bed to improve sleep quality   Increase physical activity   Incrementally reintroduce exercise back into routine   150 minutes per week is recommendation  Try chair yoga or seated exercises on YouTube  Increase fiber intake   Eat at least 3 servings of whole grains a day  Increase fruit and vegetable intake  Maintain adequate fluid intake   Drink at least 64oz water daily   Decrease sodium intake   Reduce sodium intake to recommended 1500mg /day   Read nutrition labels on packaged foods to monitor sodium intake  ? 400-600 mg/meal ? <200 mg/snack   Decrease fast food frequency   Avoid processed meats  Decrease saturated fat intake   Try more plant-based sources of protein  Limit processed meats   Switch to low fat dairy products   Education Materials given:  Marland Kitchen Sleep quality 101 handout   . 10 stress management techniques  . Goals/ instructions  Learner/ who was taught:  . Patient   Level of understanding: Marland Kitchen Verbalizes/ demonstrates competency  Demonstrated degree of understanding via:   Teach back Learning barriers: . None  Willingness to learn/ readiness for change: . Eager, change in progress  Monitoring and Evaluation:  Dietary intake, exercise, HgA1c, and body weight      follow up: January 12 at 3p

## 2020-06-29 ENCOUNTER — Other Ambulatory Visit: Payer: Self-pay | Admitting: Oncology

## 2020-06-29 ENCOUNTER — Telehealth: Payer: Self-pay | Admitting: *Deleted

## 2020-06-29 DIAGNOSIS — C50212 Malignant neoplasm of upper-inner quadrant of left female breast: Secondary | ICD-10-CM

## 2020-06-29 DIAGNOSIS — G4700 Insomnia, unspecified: Secondary | ICD-10-CM | POA: Diagnosis not present

## 2020-06-29 DIAGNOSIS — F411 Generalized anxiety disorder: Secondary | ICD-10-CM | POA: Diagnosis not present

## 2020-06-29 DIAGNOSIS — F410 Panic disorder [episodic paroxysmal anxiety] without agoraphobia: Secondary | ICD-10-CM | POA: Diagnosis not present

## 2020-06-29 DIAGNOSIS — Z170 Estrogen receptor positive status [ER+]: Secondary | ICD-10-CM

## 2020-06-29 NOTE — Telephone Encounter (Signed)
Barbette Reichmann, resident psychiatrist from [facility] called asking for Dr Tasia Catchings to return her call to discuss this patient [phone]

## 2020-07-10 NOTE — Telephone Encounter (Signed)
I called last week and talked to Dr.Howard.

## 2020-07-20 DIAGNOSIS — F411 Generalized anxiety disorder: Secondary | ICD-10-CM | POA: Diagnosis not present

## 2020-07-20 DIAGNOSIS — F410 Panic disorder [episodic paroxysmal anxiety] without agoraphobia: Secondary | ICD-10-CM | POA: Diagnosis not present

## 2020-07-24 ENCOUNTER — Ambulatory Visit
Admission: RE | Admit: 2020-07-24 | Discharge: 2020-07-24 | Disposition: A | Discharge status: Home / Self Care | Payer: Medicaid Other | Source: Ambulatory Visit | Attending: Radiation Oncology | Admitting: Radiation Oncology

## 2020-07-24 ENCOUNTER — Other Ambulatory Visit: Payer: Self-pay

## 2020-07-24 ENCOUNTER — Encounter: Payer: Self-pay | Admitting: Radiation Oncology

## 2020-07-24 ENCOUNTER — Ambulatory Visit: Payer: Self-pay

## 2020-07-24 VITALS — BP 136/86 | HR 77

## 2020-07-24 DIAGNOSIS — Z79810 Long term (current) use of selective estrogen receptor modulators (SERMs): Secondary | ICD-10-CM | POA: Insufficient documentation

## 2020-07-24 DIAGNOSIS — C50212 Malignant neoplasm of upper-inner quadrant of left female breast: Secondary | ICD-10-CM | POA: Insufficient documentation

## 2020-07-24 DIAGNOSIS — Z170 Estrogen receptor positive status [ER+]: Secondary | ICD-10-CM | POA: Insufficient documentation

## 2020-07-24 DIAGNOSIS — Z923 Personal history of irradiation: Secondary | ICD-10-CM | POA: Diagnosis not present

## 2020-07-24 NOTE — Progress Notes (Signed)
Radiation Oncology Follow up Note  Name: Sheena Martinez   Date:   07/24/2020 MRN:  [id number] DOB: 08-13-1960    This 59 y.o. female presents to the clinic today for 3-year follow-up status post accelerated partial breast radiation to her left breast for stage I ER/PR positive invasive mammary carcinoma.  REFERRING PROVIDER: Hubbard Hartshorn, FNP  HPI: Patient is a 59 year old female now out 3 years having completed accelerated partial breast radiation to her left breast for stage I ER/PR positive invasive mammary carcinoma.  She is seen today in routine follow-up and is doing well.  She specifically denies breast tenderness cough or bone pain..  She is currently on tamoxifen tolerating that well without side effect.  She had mammograms in September which I have reviewed were BI-RADS 2 benign  COMPLICATIONS OF TREATMENT: none  FOLLOW UP COMPLIANCE: keeps appointments   PHYSICAL EXAM:  BP 136/86 (BP Location: Right Arm)   Pulse 77   Resp (P) 16   Wt (P) 226 lb 9.6 oz (102.8 kg)   BMI (P) 44.25 kg/m  Lungs are clear to A&P cardiac examination essentially unremarkable with regular rate and rhythm. No dominant mass or nodularity is noted in either breast in 2 positions examined. Incision is well-healed. No axillary or supraclavicular adenopathy is appreciated. Cosmetic result is excellent.  Well-developed well-nourished patient in NAD. HEENT reveals PERLA, EOMI, discs not visualized.  Oral cavity is clear. No oral mucosal lesions are identified. Neck is clear without evidence of cervical or supraclavicular adenopathy. Lungs are clear to A&P. Cardiac examination is essentially unremarkable with regular rate and rhythm without murmur rub or thrill. Abdomen is benign with no organomegaly or masses noted. Motor sensory and DTR levels are equal and symmetric in the upper and lower extremities. Cranial nerves II through XII are grossly intact. Proprioception is intact. No peripheral adenopathy or edema  is identified. No motor or sensory levels are noted. Crude visual fields are within normal range.  RADIOLOGY RESULTS: Mammograms reviewed compatible with above-stated findings  PLAN: Present time patient is doing well with no evidence of disease now at 3 years I am pleased with her overall progress.  She continues tamoxifen without side effect.  I have asked to see her back in 1 year for follow-up.  Patient knows to call with any concerns.  I would like to take this opportunity to thank you for allowing me to participate in the care of your patient.Noreene Filbert, MD

## 2020-08-21 ENCOUNTER — Ambulatory Visit: Payer: Medicaid Other | Admitting: Dietician

## 2020-08-25 ENCOUNTER — Encounter: Payer: Self-pay | Admitting: Oncology

## 2020-08-25 ENCOUNTER — Inpatient Hospital Stay: Payer: Medicaid Other | Attending: Oncology

## 2020-08-25 ENCOUNTER — Inpatient Hospital Stay (HOSPITAL_BASED_OUTPATIENT_CLINIC_OR_DEPARTMENT_OTHER): Payer: Medicaid Other | Admitting: Oncology

## 2020-08-25 VITALS — BP 119/73 | Temp 98.1°F | Wt 228.4 lb

## 2020-08-25 DIAGNOSIS — E0590 Thyrotoxicosis, unspecified without thyrotoxic crisis or storm: Secondary | ICD-10-CM | POA: Diagnosis not present

## 2020-08-25 DIAGNOSIS — Z800 Family history of malignant neoplasm of digestive organs: Secondary | ICD-10-CM | POA: Insufficient documentation

## 2020-08-25 DIAGNOSIS — Z170 Estrogen receptor positive status [ER+]: Secondary | ICD-10-CM

## 2020-08-25 DIAGNOSIS — M8580 Other specified disorders of bone density and structure, unspecified site: Secondary | ICD-10-CM | POA: Diagnosis not present

## 2020-08-25 DIAGNOSIS — Z833 Family history of diabetes mellitus: Secondary | ICD-10-CM | POA: Diagnosis not present

## 2020-08-25 DIAGNOSIS — N898 Other specified noninflammatory disorders of vagina: Secondary | ICD-10-CM | POA: Diagnosis not present

## 2020-08-25 DIAGNOSIS — Z832 Family history of diseases of the blood and blood-forming organs and certain disorders involving the immune mechanism: Secondary | ICD-10-CM | POA: Insufficient documentation

## 2020-08-25 DIAGNOSIS — F419 Anxiety disorder, unspecified: Secondary | ICD-10-CM | POA: Diagnosis not present

## 2020-08-25 DIAGNOSIS — Z809 Family history of malignant neoplasm, unspecified: Secondary | ICD-10-CM | POA: Diagnosis not present

## 2020-08-25 DIAGNOSIS — M3500 Sicca syndrome, unspecified: Secondary | ICD-10-CM | POA: Diagnosis not present

## 2020-08-25 DIAGNOSIS — Z87891 Personal history of nicotine dependence: Secondary | ICD-10-CM | POA: Insufficient documentation

## 2020-08-25 DIAGNOSIS — Z8379 Family history of other diseases of the digestive system: Secondary | ICD-10-CM | POA: Diagnosis not present

## 2020-08-25 DIAGNOSIS — F411 Generalized anxiety disorder: Secondary | ICD-10-CM | POA: Diagnosis not present

## 2020-08-25 DIAGNOSIS — M2550 Pain in unspecified joint: Secondary | ICD-10-CM | POA: Insufficient documentation

## 2020-08-25 DIAGNOSIS — Z8041 Family history of malignant neoplasm of ovary: Secondary | ICD-10-CM | POA: Diagnosis not present

## 2020-08-25 DIAGNOSIS — Z801 Family history of malignant neoplasm of trachea, bronchus and lung: Secondary | ICD-10-CM | POA: Diagnosis not present

## 2020-08-25 DIAGNOSIS — E785 Hyperlipidemia, unspecified: Secondary | ICD-10-CM | POA: Diagnosis not present

## 2020-08-25 DIAGNOSIS — Z79899 Other long term (current) drug therapy: Secondary | ICD-10-CM | POA: Insufficient documentation

## 2020-08-25 DIAGNOSIS — Z806 Family history of leukemia: Secondary | ICD-10-CM | POA: Insufficient documentation

## 2020-08-25 DIAGNOSIS — Z803 Family history of malignant neoplasm of breast: Secondary | ICD-10-CM | POA: Diagnosis not present

## 2020-08-25 DIAGNOSIS — C50212 Malignant neoplasm of upper-inner quadrant of left female breast: Secondary | ICD-10-CM | POA: Diagnosis not present

## 2020-08-25 DIAGNOSIS — Z79810 Long term (current) use of selective estrogen receptor modulators (SERMs): Secondary | ICD-10-CM

## 2020-08-25 DIAGNOSIS — Z808 Family history of malignant neoplasm of other organs or systems: Secondary | ICD-10-CM | POA: Insufficient documentation

## 2020-08-25 LAB — CBC WITH DIFFERENTIAL/PLATELET
Abs Immature Granulocytes: 0.01 10*3/uL (ref 0.00–0.07)
Basophils Absolute: 0 10*3/uL (ref 0.0–0.1)
Basophils Relative: 0 %
Eosinophils Absolute: 0.1 10*3/uL (ref 0.0–0.5)
Eosinophils Relative: 2 %
HCT: 39.8 % (ref 36.0–46.0)
Hemoglobin: 13.4 g/dL (ref 12.0–15.0)
Immature Granulocytes: 0 %
Lymphocytes Relative: 37 %
Lymphs Abs: 1.7 10*3/uL (ref 0.7–4.0)
MCH: 31.5 pg (ref 26.0–34.0)
MCHC: 33.7 g/dL (ref 30.0–36.0)
MCV: 93.6 fL (ref 80.0–100.0)
Monocytes Absolute: 0.5 10*3/uL (ref 0.1–1.0)
Monocytes Relative: 11 %
Neutro Abs: 2.3 10*3/uL (ref 1.7–7.7)
Neutrophils Relative %: 50 %
Platelets: 287 10*3/uL (ref 150–400)
RBC: 4.25 MIL/uL (ref 3.87–5.11)
RDW: 13.3 % (ref 11.5–15.5)
WBC: 4.5 10*3/uL (ref 4.0–10.5)
nRBC: 0 % (ref 0.0–0.2)

## 2020-08-25 LAB — COMPREHENSIVE METABOLIC PANEL
ALT: 15 U/L (ref 0–44)
AST: 21 U/L (ref 15–41)
Albumin: 3.5 g/dL (ref 3.5–5.0)
Alkaline Phosphatase: 72 U/L (ref 38–126)
Anion gap: 8 (ref 5–15)
BUN: 18 mg/dL (ref 6–20)
CO2: 24 mmol/L (ref 22–32)
Calcium: 8.9 mg/dL (ref 8.9–10.3)
Chloride: 107 mmol/L (ref 98–111)
Creatinine, Ser: 0.72 mg/dL (ref 0.44–1.00)
GFR, Estimated: >60 mL/min (ref >60)
Glucose, Bld: 113 mg/dL — ABNORMAL HIGH (ref 70–99)
Potassium: 4 mmol/L (ref 3.5–5.1)
Sodium: 139 mmol/L (ref 135–145)
Total Bilirubin: 0.4 mg/dL (ref 0.3–1.2)
Total Protein: 6.7 g/dL (ref 6.5–8.1)

## 2020-08-25 NOTE — Progress Notes (Addendum)
Hematology/Oncology Follow up note [facility] [phone] [phone]   Patient Care Team: Delsa Grana, PA-C as PCP - General (Family Medicine) Rico Junker, RN as Registered Nurse Theodore Demark, RN as Registered Nurse Byrnett, Forest Gleason, MD (General Surgery) Noreene Filbert, MD as Referring Physician (Radiation Oncology) Earlie Server, MD as Consulting Physician (Oncology) Shon Hough, MD as Referring Physician (Endocrinology)  REFERRING PROVIDER: Delsa Grana, PA-C REASON FOR VISIT Follow up for breast cancer  HISTORY OF PRESENTING ILLNESS:  Sheena Martinez is a  60 y.o.  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 Margin is negative, no LVI. Mammoprintr revealed 10 year recurrence rate at 29% high risk group. Patient has completed MammoSite RT. Denies any hormonal replacement therapy.  Adjuvant chemotherapy with TC, finished 3 cycles.  She is perimenopausal.   # His Sjogren disease for which she has had tear duct surgery. She also reports history of PCOS with elevated testosterone level.   Genetic testing:  Genetic testing result vis [facility] showed no pathogenic sequence appearance or deletions /duplications.  # she developed corneal ulcer for which she has to undergo emergency ophthalmology surgery.  This is considered to be related to Sjogren's syndrome.  Patient has been started on prednisone by ophthalmologist.  Her anxiety has not been well controlled lately due to her ophthalmology problems.  Patient has been on Paxil for a long time and recently switched to Lexapro as Paxil interfere with efficacy of tamoxifen.  She feels her anxiety is not well controlled.  Current Treatment Adjuvant TC x3. Patient declined cycle 4.  Started on Tamoxifen in March 2019. Declined option of ovarian suppression with aromatase inhibitor as patient is concerned about having new side  effects.   INTERVAL HISTORY 60 y.o. female  presented for follow up for breast cancer. Patient is taking Tamoxifen $RemoveBeforeDE'20mg'[id number]$  daily. She has hot flash.  Follows up with Gyn and was recommended to try  black cohosh  She has not started yet and want to check with me if ok to be used with Tamoxifen.   She has anxiety and follows up with [facility] psychiatrist and was previously on Zoloft and Ativan. Recently Zoloft was changed to Effexor.  Hyperthyroidism, on methimazole.  Follows up with endocrinology. Cornea ulcer, s/p cornea transplant. Currently on steroid eye drop Denies any fever, chills, chest pain, shortness of breath, abdominal pain, back pain, lower extremity swelling. Denies any concern of her breast.  She has had bilateral diagnostic mammogram done on 05/06/2019  which did not show malignant findings. Bone density 03/15/2019 showed osteopenia.    INTERVAL HISTORY Sheena Martinez is a 60 y.o. female who has above history reviewed by me today presents for follow up visit for management of breast cancer.  Problems and complaints are listed below: She has been having brown vaginal discharge for the past few weeks, LMP was about 1 year ago.  Anxiety and depression were not controlled well. She wants to go back to Paxil which she was previously on but has to change to other treatments due to being on Tamoxifen.  + Hair loss, easy bruising.   Review of Systems  Constitutional: Negative for appetite change, chills, fatigue and fever.  HENT:   Negative for hearing loss and voice change.   Eyes: Negative for eye problems.  Respiratory: Negative for chest tightness and cough.   Cardiovascular: Negative for chest pain.  Gastrointestinal: Negative for abdominal distention, abdominal  pain and blood in stool.  Endocrine: Positive for hot flashes.  Genitourinary: Positive for vaginal discharge. Negative for difficulty urinating and frequency.   Musculoskeletal: Positive for arthralgias.  Skin: Negative  for itching and rash.  Neurological: Negative for extremity weakness.  Hematological: Negative for adenopathy. Bruises/bleeds easily.  Psychiatric/Behavioral: Negative for confusion. The patient is nervous/anxious.     MEDICAL HISTORY:  Past Medical History:  Diagnosis Date  . Anxiety   . Arthritis   . Breast cancer ([place]) 05/12/2017   INVASIVE MAMMARY CARCINOMA ER/PR positive LEFT BREAST UPPER inner  QUAD  . Cataract 01/11/2020   left  . Corneal perforation of left eye 10/09/2017   Overview:  Added automatically from request for surgery [id number]  . GERD (gastroesophageal reflux disease)    OCC  . Goiter   . Hematuria 10/21/2018  . Hyperthyroidism   . Personal history of radiation therapy 2018   LEFT lumpectomy  . Sjogren's syndrome ([place])   . Uses continuous positive airway pressure (CPAP) ventilation at home 04/30/2019    SURGICAL HISTORY: Past Surgical History:  Procedure Laterality Date  . BREAST BIOPSY Left 05/12/2017   [place] bx/ INVASIVE MAMMARY CARCINOMA  . BREAST LUMPECTOMY Left 05/2017   invasive mammary carcinoma, neg margins  . BREAST LUMPECTOMY WITH SENTINEL LYMPH NODE BIOPSY Left 06/10/2017   Procedure: BREAST LUMPECTOMY WITH SENTINEL LYMPH NODE BX AND NEEDLE LOCALIZATION;  Surgeon: Robert Bellow, MD;  Location: [facility] ORS;  Service: General;  Laterality: Left;  . BREAST MAMMOSITE Left 06/24/2017   Procedure: MAMMOSITE BREAST;  Surgeon: Robert Bellow, MD;  Location: [facility] ORS;  Service: General;  Laterality: Left;  . CESAREAN SECTION  1995  . 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 COSMETIC SURGERY    . CYST EXCISION  2018   pilar cyst/ Dr Will Bonnet  . EYE SURGERY    . PORTACATH PLACEMENT Right 07/08/2017   Procedure: INSERTION PORT-A-CATH;  Surgeon: Robert Bellow, MD;  Location: [facility] ORS;  Service: General;  Laterality: Right;  . TONSILLECTOMY      SOCIAL HISTORY: Social History   Socioeconomic History  . Marital status: Married    Spouse name:  Eulas Post  . Number of children: 3  . Years of education: Not on file  . Highest education level: GED or equivalent  Occupational History  . Occupation: unemployed  Tobacco Use  . Smoking status: Former Smoker    Years: 7.00    Types: Cigarettes    Quit date: 08/13/1979    Years since quitting: 41.0  . Smokeless tobacco: Never Used  . Tobacco comment: AGE [age]  Vaping Use  . Vaping Use: Never used  Substance and Sexual Activity  . Alcohol use: No  . Drug use: No  . Sexual activity: Yes    Partners: Male  Other Topics Concern  . Not on file  Social History Narrative  . Not on file   Social Determinants of Health   Financial Resource Strain: Not on file  Food Insecurity: Not on file  Transportation Needs: Not on file  Physical Activity: Not on file  Stress: Not on file  Social Connections: Not on file  Intimate Partner Violence: Not on file    FAMILY HISTORY: Family History  Problem Relation Age of Onset  . Breast cancer Maternal Aunt 20       currently ~65  . Diabetes Mother   . Skin cancer Mother        currently 2; TAH/BSO (age?)  .  Anemia Mother   . Liver disease Father        deceased / not much info about him / alcoholic  . Lung cancer Paternal Aunt        smoker / deceased 37s  . Stomach cancer Paternal Uncle 44       deceased / deceased 47s  . Ovarian cancer Maternal Grandmother 67       deceased 87s  . Stomach cancer Paternal Grandfather 84       deceased 30s  . Cancer Maternal Uncle        unk. type; deceased 98s  . Leukemia Cousin 89       deceased 20    ALLERGIES:  is allergic to rituximab, benadryl [diphenhydramine], cortisone, diphenhydramine hcl, clonazepam, and sulfamethoxazole-trimethoprim.  MEDICATIONS:  Current Outpatient Medications  Medication Sig Dispense Refill  . acetaminophen (TYLENOL) 500 MG tablet Take 1,000 mg by mouth every 8 (eight) hours as needed for mild pain or headache.    . Ca Carbonate-Mag Hydroxide (ROLAIDS PO) Take 1  tablet as needed by mouth (indigestion).     . cholecalciferol (VITAMIN D) 1000 units tablet Take 2,000 Units by mouth daily.    . dorzolamide-timolol (COSOPT) 22.3-6.8 MG/ML ophthalmic solution Administer 1 drop into the left eye Two (2) times a day.    Marland Kitchen doxycycline (ADOXA) 100 MG tablet Take 100 mg by mouth daily.    Marland Kitchen ketoconazole (NIZORAL) 2 % cream Apply 1 application topically daily.    Marland Kitchen ketoconazole (NIZORAL) 2 % shampoo Apply 1 application topically 2 (two) times a week. 120 mL 3  . LORazepam (ATIVAN) 0.5 MG tablet   0  . loteprednol (LOTEMAX) 0.5 % ophthalmic suspension Place 1 drop into the left eye 2 (two) times daily.    . meloxicam (MOBIC) 15 MG tablet Take 0.5-1 tablets (7.5-15 mg total) by mouth daily. For chronic joint pain and arthritis 90 tablet 3  . moxifloxacin (VIGAMOX) 0.5 % ophthalmic solution INSTILL 1 DROP INTO LEFT EYE 4 TIMES DAILY FOR 14 DAYS    . tamoxifen (NOLVADEX) 20 MG tablet Take 1 tablet by mouth once daily 90 tablet 1  . tizanidine (ZANAFLEX) 2 MG capsule Take 1 capsule (2 mg total) by mouth 3 (three) times daily as needed for muscle spasms. 30 capsule 2  . venlafaxine XR (EFFEXOR-XR) 75 MG 24 hr capsule Take 75 mg by mouth daily with breakfast.    . vitamin k 100 MCG tablet Take 100 mcg by mouth 2 (two) times a week.     No current facility-administered medications for this visit.      Marland Kitchen  PHYSICAL EXAMINATION: ECOG PERFORMANCE STATUS: 0 - Asymptomatic Vitals:   08/25/20 1027  BP: 119/73  Temp: 98.1 F (36.7 C)   Physical Exam Constitutional:      General: She is not in acute distress.    Appearance: She is not diaphoretic.     Comments: Obese.   HENT:     Head: Normocephalic and atraumatic.     Nose: Nose normal.     Mouth/Throat:     Pharynx: No oropharyngeal exudate.  Eyes:     General: No scleral icterus.    Conjunctiva/sclera: Conjunctivae normal.     Pupils: Pupils are equal, round, and reactive to light.  Neck:     Thyroid: No  thyromegaly.  Cardiovascular:     Rate and Rhythm: Normal rate and regular rhythm.     Heart sounds: Normal heart sounds. No murmur  heard.   Pulmonary:     Effort: Pulmonary effort is normal. No respiratory distress.     Breath sounds: Normal breath sounds. No wheezing or rales.  Chest:     Chest wall: No tenderness.  Abdominal:     General: Bowel sounds are normal. There is no distension.     Palpations: Abdomen is soft.     Tenderness: There is no abdominal tenderness.  Musculoskeletal:        General: Normal range of motion.     Cervical back: Normal range of motion and neck supple.  Lymphadenopathy:     Cervical: No cervical adenopathy.  Skin:    General: Skin is warm and dry.     Findings: No erythema or rash.  Neurological:     Mental Status: She is alert and oriented to person, place, and time.     Cranial Nerves: No cranial nerve deficit.     Motor: No abnormal muscle tone.     Coordination: Coordination normal.  Psychiatric:        Mood and Affect: Affect normal.    LABORATORY DATA:  I have reviewed the data as listed: no recent labs.  Lab Results  Component Value Date   WBC 4.5 08/25/2020   HGB 13.4 08/25/2020   HCT 39.8 08/25/2020   MCV 93.6 08/25/2020   PLT 287 08/25/2020   Recent Labs    02/07/20 1414 04/25/20 1352 08/25/20 0947  NA 140 142 139  K 4.2 4.3 4.0  CL 107 107 107  CO2 $Re'26 27 24  'qsH$ GLUCOSE 99 86 113*  BUN $Re'14 14 18  'CHL$ CREATININE 0.75 0.73 0.72  CALCIUM 8.6* 9.2 8.9  GFRNONAA >60 90 >60  GFRAA >60 104  --   PROT 6.8 6.2 6.7  ALBUMIN 3.4*  --  3.5  AST $Re'23 14 21  'EOg$ ALT $R'20 13 15  'dS$ ALKPHOS 90  --  72  BILITOT 0.5 0.4 0.4    RADIOGRAPHIC STUDIES: I have personally reviewed the radiological images as listed and agreed with the findings in the report. 05/04/2018 bilateral diagnostic mammogram benign finding.  ASSESSMENT & PLAN:  60 yo premenopausal female with Stage 1A breast cancer, high risk mammaprint index currently on adjuvant  chemotherapy with TC. Cancer Staging Malignant neoplasm of upper-inner quadrant of left breast in female, estrogen receptor positive ([place]) Staging form: Breast, AJCC 8th Edition - Clinical stage from 05/22/2017: Stage IA (cT1, cN0, cM0, G1, ER: Positive, PR: Positive, HER2: Negative) - Signed by Earlie Server, MD on 05/22/2017 - Pathologic stage from 07/09/2017: Stage IA (pT1b, pN0, cM0, G2, ER: Positive, PR: Positive, HER2: Negative) - Signed by Earlie Server, MD on 07/30/2017  1. Malignant neoplasm of upper-inner quadrant of left breast in female, estrogen receptor positive ([place])   2. Long-term current use of tamoxifen   3. Osteopenia, unspecified location   4. Generalized anxiety disorder   5. Anxiety     # Stage IA, left Breast cancer.  Vaginal discharge discontinue Tamoxifen due to vaginal discharge. Recommend patient to call her Gyn for evaluation.  Check FSH, estradiol.  I discussed with her about switching to aromatase inhibitor +/- ovarian suppression, depending on her menaupasue status.  Rationale of using aromatase inhibitor -Arimidex  discussed with patient.  Side effects of Arimidex, Zoladex including but not limited to hot flush, joint pain, fatigue, mood swing, osteoporosis discussed with patient. Patient voices understanding and willing to proceed.  # Osteopenia, recommend calcium and vitamin D supplementation.  Recommend patient  to obtain dental clearance, consider bisphosphonate.  # Anxiety, once she is switched to AI, she maybe restart Paxil.      Return of visit: 4-6 weeks.   Earlie Server, MD, PhD Hematology Oncology [facility] Pager- [phone] 08/25/2020

## 2020-08-25 NOTE — Progress Notes (Signed)
Patient Stated that she is having some brown down discharge in her panties and having pelvic pain. She also stated that she is having hair loss. She stated that her anxiety medicine is not working, she feel anxious in the mherning  When she wakes up. She told me that her skin is bruising easily feel crapey

## 2020-08-26 LAB — ESTRADIOL: Estradiol: 32.7 pg/mL

## 2020-08-26 LAB — FOLLICLE STIMULATING HORMONE: FSH: 22 m[IU]/mL

## 2020-08-26 MED ORDER — ANASTROZOLE 1 MG PO TABS: 1.0000 mg | ORAL_TABLET | Freq: Every day | ORAL | 1 refills | Status: DC

## 2020-08-26 NOTE — Addendum Note (Signed)
Addended by: Earlie Server on: 08/26/2020 06:44 PM   Modules accepted: Orders

## 2020-08-28 ENCOUNTER — Telehealth: Payer: Self-pay

## 2020-08-28 ENCOUNTER — Encounter: Payer: Self-pay | Admitting: Oncology

## 2020-08-28 NOTE — Telephone Encounter (Signed)
Per Dr. Tasia Catchings: Madaline Brilliant to start paxil if psychiatrist agrees. Pt has been notified of MD response via Mychart.

## 2020-08-28 NOTE — Telephone Encounter (Signed)
Patient has been notified of prescription change and follow up plan. Patient is asking if she can go ahead and contact her psyciatrist for her to restart on Paxil. She has sent a Mychart message regarding this as well.

## 2020-08-28 NOTE — Telephone Encounter (Signed)
-----   Message from Earlie Server, MD sent at 08/26/2020  6:54 PM EST ----- Please let her know that she is still in the trasition of menopause. Recommend her to stop Tamoxifen, I sent her Rx of Arimidex 1mg  daily, and she will need to have Zoladex monthly, please arrange he to get Zoladex asap.  Follow up, Zoladex 4 weeks (from the first injection,), 8 weeks,  Lab cbc cmp MD Zoladex in 12 weeks. Thanks.

## 2020-08-28 NOTE — Telephone Encounter (Signed)
Done.. All appts have been sched as requested Pt was made aware

## 2020-08-30 ENCOUNTER — Ambulatory Visit: Payer: Medicaid Other | Admitting: Obstetrics & Gynecology

## 2020-08-31 ENCOUNTER — Inpatient Hospital Stay: Payer: Medicaid Other

## 2020-08-31 DIAGNOSIS — Z79899 Other long term (current) drug therapy: Secondary | ICD-10-CM | POA: Diagnosis not present

## 2020-08-31 DIAGNOSIS — F411 Generalized anxiety disorder: Secondary | ICD-10-CM | POA: Diagnosis not present

## 2020-08-31 DIAGNOSIS — M8580 Other specified disorders of bone density and structure, unspecified site: Secondary | ICD-10-CM | POA: Diagnosis not present

## 2020-08-31 DIAGNOSIS — Z170 Estrogen receptor positive status [ER+]: Secondary | ICD-10-CM

## 2020-08-31 DIAGNOSIS — M3500 Sicca syndrome, unspecified: Secondary | ICD-10-CM | POA: Diagnosis not present

## 2020-08-31 DIAGNOSIS — E0590 Thyrotoxicosis, unspecified without thyrotoxic crisis or storm: Secondary | ICD-10-CM | POA: Diagnosis not present

## 2020-08-31 DIAGNOSIS — M2550 Pain in unspecified joint: Secondary | ICD-10-CM | POA: Diagnosis not present

## 2020-08-31 DIAGNOSIS — E785 Hyperlipidemia, unspecified: Secondary | ICD-10-CM | POA: Diagnosis not present

## 2020-08-31 DIAGNOSIS — Z87891 Personal history of nicotine dependence: Secondary | ICD-10-CM | POA: Diagnosis not present

## 2020-08-31 DIAGNOSIS — Z833 Family history of diabetes mellitus: Secondary | ICD-10-CM | POA: Diagnosis not present

## 2020-08-31 DIAGNOSIS — Z801 Family history of malignant neoplasm of trachea, bronchus and lung: Secondary | ICD-10-CM | POA: Diagnosis not present

## 2020-08-31 DIAGNOSIS — C50212 Malignant neoplasm of upper-inner quadrant of left female breast: Secondary | ICD-10-CM | POA: Diagnosis not present

## 2020-08-31 DIAGNOSIS — Z8379 Family history of other diseases of the digestive system: Secondary | ICD-10-CM | POA: Diagnosis not present

## 2020-08-31 DIAGNOSIS — Z806 Family history of leukemia: Secondary | ICD-10-CM | POA: Diagnosis not present

## 2020-08-31 DIAGNOSIS — Z832 Family history of diseases of the blood and blood-forming organs and certain disorders involving the immune mechanism: Secondary | ICD-10-CM | POA: Diagnosis not present

## 2020-08-31 DIAGNOSIS — Z803 Family history of malignant neoplasm of breast: Secondary | ICD-10-CM | POA: Diagnosis not present

## 2020-08-31 DIAGNOSIS — Z808 Family history of malignant neoplasm of other organs or systems: Secondary | ICD-10-CM | POA: Diagnosis not present

## 2020-08-31 DIAGNOSIS — Z800 Family history of malignant neoplasm of digestive organs: Secondary | ICD-10-CM | POA: Diagnosis not present

## 2020-08-31 DIAGNOSIS — N898 Other specified noninflammatory disorders of vagina: Secondary | ICD-10-CM | POA: Diagnosis not present

## 2020-08-31 DIAGNOSIS — Z809 Family history of malignant neoplasm, unspecified: Secondary | ICD-10-CM | POA: Diagnosis not present

## 2020-08-31 DIAGNOSIS — Z8041 Family history of malignant neoplasm of ovary: Secondary | ICD-10-CM | POA: Diagnosis not present

## 2020-08-31 MED ORDER — GOSERELIN ACETATE 3.6 MG ~~LOC~~ IMPL
3.6000 mg | DRUG_IMPLANT | Freq: Once | SUBCUTANEOUS | Status: AC
  Administered 2020-08-31: 3.6 mg via SUBCUTANEOUS
  Filled 2020-08-31: qty 3.6

## 2020-09-01 ENCOUNTER — Telehealth: Payer: Self-pay | Admitting: *Deleted

## 2020-09-01 NOTE — Telephone Encounter (Signed)
Patient does not know how to check it, She will be there by noon for VS check

## 2020-09-01 NOTE — Telephone Encounter (Signed)
Pt came in for vital sign check. T-99.0, P-82, BP-143/85, R-19, O2 97%. Denies chest pain or dizziness. States she feels "jittery" and notes a slight increase in shortness of breath with exertion. Heart sounds normal upon ascultation. Discussed with Dr. Tasia Catchings, who does not feel there is any concern at this time. Recommended for patient to stay away from caffeine and stimulants and to monitor symptoms. Informed patient of recommendations and encouraged to call for any worsening or new symptoms. She verbalized understanding.

## 2020-09-01 NOTE — Telephone Encounter (Signed)
Is she able to check her pulse? If not, can she come to get a set of vital sign?

## 2020-09-01 NOTE — Telephone Encounter (Signed)
Her heart is like racing like it does when she has too much coffee

## 2020-09-01 NOTE — Telephone Encounter (Signed)
Did patient state what kind of symptoms did she exhibit when referring to "a LOT of coffee."

## 2020-09-01 NOTE — Telephone Encounter (Signed)
Patient called reporting that she got her first dose of Zoladex yesterday and this morning she feels as if she has had a LOT of coffee which she has not had any. Asking if this is normal . Please advise

## 2020-09-05 ENCOUNTER — Ambulatory Visit: Payer: Medicaid Other | Admitting: Obstetrics and Gynecology

## 2020-09-05 NOTE — Progress Notes (Deleted)
Delsa Grana, PA-C   No chief complaint on file.   HPI:      [name] is a 60 y.o. [phone] whose LMP was No LMP recorded. (Menstrual status: Perimenopausal)., presents today for ***    Past Medical History:  Diagnosis Date  . Anxiety   . Arthritis   . Breast cancer ([place]) 05/12/2017   INVASIVE MAMMARY CARCINOMA ER/PR positive LEFT BREAST UPPER inner  QUAD  . Cataract 01/11/2020   left  . Corneal perforation of left eye 10/09/2017   Overview:  Added automatically from request for surgery [id number]  . GERD (gastroesophageal reflux disease)    OCC  . Goiter   . Hematuria 10/21/2018  . Hyperthyroidism   . Personal history of radiation therapy 2018   LEFT lumpectomy  . Sjogren's syndrome ([place])   . Uses continuous positive airway pressure (CPAP) ventilation at home 04/30/2019    Past Surgical History:  Procedure Laterality Date  . BREAST BIOPSY Left 05/12/2017   [place] bx/ INVASIVE MAMMARY CARCINOMA  . BREAST LUMPECTOMY Left 05/2017   invasive mammary carcinoma, neg margins  . BREAST LUMPECTOMY WITH SENTINEL LYMPH NODE BIOPSY Left 06/10/2017   Procedure: BREAST LUMPECTOMY WITH SENTINEL LYMPH NODE BX AND NEEDLE LOCALIZATION;  Surgeon: Robert Bellow, MD;  Location: [facility] ORS;  Service: General;  Laterality: Left;  . BREAST MAMMOSITE Left 06/24/2017   Procedure: MAMMOSITE BREAST;  Surgeon: Robert Bellow, MD;  Location: [facility] ORS;  Service: General;  Laterality: Left;  . CESAREAN SECTION  1995  . 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 COSMETIC SURGERY    . CYST EXCISION  2018   pilar cyst/ Dr Will Bonnet  . EYE SURGERY    . PORTACATH PLACEMENT Right 07/08/2017   Procedure: INSERTION PORT-A-CATH;  Surgeon: Robert Bellow, MD;  Location: [facility] ORS;  Service: General;  Laterality: Right;  . TONSILLECTOMY      Family History  Problem Relation Age of Onset  . Breast cancer Maternal Aunt 27       currently ~65  . Diabetes Mother   . Skin cancer Mother         currently 79; TAH/BSO (age?)  . Anemia Mother   . Liver disease Father        deceased / not much info about him / alcoholic  . Lung cancer Paternal Aunt        smoker / deceased 26s  . Stomach cancer Paternal Uncle 67       deceased / deceased 18s  . Ovarian cancer Maternal Grandmother 73       deceased 45s  . Stomach cancer Paternal Grandfather 34       deceased 52s  . Cancer Maternal Uncle        unk. type; deceased 78s  . Leukemia Cousin 3       deceased 48    Social History   Socioeconomic History  . Marital status: Married    Spouse name: Eulas Post  . Number of children: 3  . Years of education: Not on file  . Highest education level: GED or equivalent  Occupational History  . Occupation: unemployed  Tobacco Use  . Smoking status: Former Smoker    Years: 7.00    Types: Cigarettes    Quit date: 08/13/1979    Years since quitting: 41.0  . Smokeless tobacco: Never Used  . Tobacco comment: AGE [age]  Vaping Use  . Vaping Use: Never used  Substance and Sexual Activity  . Alcohol use: No  . Drug use: No  . Sexual activity: Yes    Partners: Male  Other Topics Concern  . Not on file  Social History Narrative  . Not on file   Social Determinants of Health   Financial Resource Strain: Not on file  Food Insecurity: Not on file  Transportation Needs: Not on file  Physical Activity: Not on file  Stress: Not on file  Social Connections: Not on file  Intimate Partner Violence: Not on file    Outpatient Medications Prior to Visit  Medication Sig Dispense Refill  . acetaminophen (TYLENOL) 500 MG tablet Take 1,000 mg by mouth every 8 (eight) hours as needed for mild pain or headache.    . anastrozole (ARIMIDEX) 1 MG tablet Take 1 tablet (1 mg total) by mouth daily. 90 tablet 1  . Ca Carbonate-Mag Hydroxide (ROLAIDS PO) Take 1 tablet as needed by mouth (indigestion).     . cholecalciferol (VITAMIN D) 1000 units tablet Take 2,000 Units by mouth daily.    .  dorzolamide-timolol (COSOPT) 22.3-6.8 MG/ML ophthalmic solution Administer 1 drop into the left eye Two (2) times a day.    Marland Kitchen doxycycline (ADOXA) 100 MG tablet Take 100 mg by mouth daily.    Marland Kitchen ketoconazole (NIZORAL) 2 % cream Apply 1 application topically daily.    Marland Kitchen ketoconazole (NIZORAL) 2 % shampoo Apply 1 application topically 2 (two) times a week. 120 mL 3  . LORazepam (ATIVAN) 0.5 MG tablet   0  . loteprednol (LOTEMAX) 0.5 % ophthalmic suspension Place 1 drop into the left eye 2 (two) times daily.    . meloxicam (MOBIC) 15 MG tablet Take 0.5-1 tablets (7.5-15 mg total) by mouth daily. For chronic joint pain and arthritis 90 tablet 3  . moxifloxacin (VIGAMOX) 0.5 % ophthalmic solution INSTILL 1 DROP INTO LEFT EYE 4 TIMES DAILY FOR 14 DAYS    . tizanidine (ZANAFLEX) 2 MG capsule Take 1 capsule (2 mg total) by mouth 3 (three) times daily as needed for muscle spasms. 30 capsule 2  . venlafaxine XR (EFFEXOR-XR) 75 MG 24 hr capsule Take 75 mg by mouth daily with breakfast.    . vitamin k 100 MCG tablet Take 100 mcg by mouth 2 (two) times a week.     No facility-administered medications prior to visit.      ROS:  Review of Systems BREAST: No symptoms   OBJECTIVE:   Vitals:  There were no vitals taken for this visit.  Physical Exam  Results: No results found for this or any previous visit (from the past 24 hour(s)).   Assessment/Plan: No diagnosis found.    No orders of the defined types were placed in this encounter.     No follow-ups on file.  Nas Wafer B. Pocahontas Cohenour, PA-C 09/05/2020 10:59 AM

## 2020-09-06 DIAGNOSIS — T380X5A Adverse effect of glucocorticoids and synthetic analogues, initial encounter: Secondary | ICD-10-CM | POA: Insufficient documentation

## 2020-09-06 DIAGNOSIS — H4062X0 Glaucoma secondary to drugs, left eye, stage unspecified: Secondary | ICD-10-CM | POA: Insufficient documentation

## 2020-09-07 DIAGNOSIS — T380X5A Adverse effect of glucocorticoids and synthetic analogues, initial encounter: Secondary | ICD-10-CM | POA: Diagnosis not present

## 2020-09-07 DIAGNOSIS — F419 Anxiety disorder, unspecified: Secondary | ICD-10-CM | POA: Diagnosis not present

## 2020-09-07 DIAGNOSIS — E669 Obesity, unspecified: Secondary | ICD-10-CM | POA: Diagnosis not present

## 2020-09-07 DIAGNOSIS — Z79899 Other long term (current) drug therapy: Secondary | ICD-10-CM | POA: Diagnosis not present

## 2020-09-07 DIAGNOSIS — E785 Hyperlipidemia, unspecified: Secondary | ICD-10-CM | POA: Diagnosis not present

## 2020-09-07 DIAGNOSIS — Z87891 Personal history of nicotine dependence: Secondary | ICD-10-CM | POA: Diagnosis not present

## 2020-09-07 DIAGNOSIS — H4062X0 Glaucoma secondary to drugs, left eye, stage unspecified: Secondary | ICD-10-CM | POA: Diagnosis not present

## 2020-09-08 ENCOUNTER — Telehealth: Payer: Self-pay | Admitting: Family Medicine

## 2020-09-08 NOTE — Telephone Encounter (Signed)
lvm for pt to schedule appt °

## 2020-09-08 NOTE — Telephone Encounter (Signed)
Patient called to request a referral to a cardiologist.  She stated it was suggested by her eye doctor after having a procedure.  Please call patient to discuss asap.  CB# [phone]

## 2020-09-08 NOTE — Telephone Encounter (Signed)
Per anesthesia/surgeon note: Pt and spouse, Sheena Martinez informed by Dr. Almeta Monas of some cardiac abnormalities noted during her surgery under anesthesia. Ekg performed at bedside for baseline data and filed. Dr. Almeta Monas informed pt and spouse to seek PCP for follow up of noted cardiac abnormalities and for referral to cardiology if they feel further work up is warranted

## 2020-09-08 NOTE — Telephone Encounter (Signed)
Please schedule in person visit for referral

## 2020-09-08 NOTE — Telephone Encounter (Signed)
Pt called stating that her opthalmologist is placing a referral for her for cardiology already. Pt declines appt at the moment and state she will contact PCP if she changes her mind. Please advise.

## 2020-09-11 ENCOUNTER — Ambulatory Visit: Payer: Medicaid Other | Admitting: Dietician

## 2020-09-11 ENCOUNTER — Ambulatory Visit (INDEPENDENT_AMBULATORY_CARE_PROVIDER_SITE_OTHER): Payer: Medicaid Other | Admitting: Obstetrics & Gynecology

## 2020-09-11 ENCOUNTER — Other Ambulatory Visit (HOSPITAL_COMMUNITY)
Admission: RE | Admit: 2020-09-11 | Discharge: 2020-09-11 | Disposition: A | Discharge status: Home / Self Care | Payer: Medicaid Other | Source: Ambulatory Visit | Attending: Obstetrics & Gynecology | Admitting: Obstetrics & Gynecology

## 2020-09-11 ENCOUNTER — Encounter: Payer: Self-pay | Admitting: Obstetrics & Gynecology

## 2020-09-11 ENCOUNTER — Other Ambulatory Visit: Payer: Self-pay

## 2020-09-11 VITALS — BP 122/80 | Ht 60.0 in | Wt 227.0 lb

## 2020-09-11 DIAGNOSIS — N950 Postmenopausal bleeding: Secondary | ICD-10-CM | POA: Diagnosis not present

## 2020-09-11 DIAGNOSIS — Z853 Personal history of malignant neoplasm of breast: Secondary | ICD-10-CM | POA: Diagnosis not present

## 2020-09-11 DIAGNOSIS — Z124 Encounter for screening for malignant neoplasm of cervix: Secondary | ICD-10-CM | POA: Insufficient documentation

## 2020-09-11 NOTE — Patient Instructions (Signed)

## 2020-09-11 NOTE — Progress Notes (Signed)
Postmenopausal Bleeding Patient complains of vaginal bleeding. She has been menopausal for several years. Currently on TAMOXIFEN for the past 3 years although recently being switched to Arimidex for her breast cancer prevention of recurrence.   Bleeding is described as more brown than red as if a discharge.  Other menopausal symptoms include: occas hot flashes. Workup to date: none.  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 Her smoking use included cigarettes. She quit after 7.00 years of use. She has never used smokeless tobacco. She reports that she does not drink alcohol and does not use drugs.  She has a current medication list which includes the following prescription(s): acetaminophen, anastrozole, ca carbonate-mag hydroxide, cholecalciferol, doxycycline, ketoconazole, ketoconazole, lorazepam, meloxicam, moxifloxacin, tizanidine, vitamin k, dorzolamide-timolol, loteprednol, and venlafaxine xr. Also, is allergic to rituximab, benadryl [diphenhydramine], cortisone, diphenhydramine hcl, clonazepam, and sulfamethoxazole-trimethoprim.  Review of Systems  Constitutional: Negative for chills, fever and malaise/fatigue.  HENT: Negative for congestion, sinus pain and sore throat.   Eyes: Negative for blurred vision and pain.  Respiratory: Negative for cough and wheezing.   Cardiovascular: Negative for chest pain and leg swelling.  Gastrointestinal: Negative for abdominal pain, constipation, diarrhea, heartburn, nausea and vomiting.  Genitourinary: Negative for dysuria, frequency, hematuria and urgency.  Musculoskeletal: Negative for back pain, joint pain, myalgias and neck pain.  Skin: Negative for itching and rash.  Neurological: Negative for dizziness, tremors and weakness.  Endo/Heme/Allergies: Does not bruise/bleed easily.  Psychiatric/Behavioral: Negative for depression. The patient is not nervous/anxious and does not have insomnia.     Objective: BP 122/80   Ht 5' (1.524 m)   Wt 227 lb (103 kg)   BMI 44.33 kg/m  Physical Exam Constitutional:      General: She is not in acute distress.    Appearance: She is well-developed.  Genitourinary:     Vagina and  uterus normal.     No vaginal erythema or bleeding.      Right Adnexa: not tender and no mass present.    Left Adnexa: not tender and no mass present.    No cervical motion tenderness, discharge, polyp or  nabothian cyst.     Uterus is mobile.     Uterus is not enlarged.     No uterine mass detected.    Uterus is midaxial.     Pelvic exam was performed with patient supine.  HENT:     Head: Normocephalic and atraumatic.     Nose: Nose normal.  Abdominal:     General: There is no distension.     Palpations: Abdomen is soft.     Tenderness: There is no abdominal tenderness.  Musculoskeletal:        General: Normal range of motion.  Neurological:     Mental Status: She is alert and oriented to person, place, and time.     Cranial Nerves: No cranial nerve deficit.  Skin:    General: Skin is warm and dry.  Psychiatric:        Attention and Perception: Attention normal.        Mood and Affect: Mood and affect normal.        Speech: Speech normal.        Behavior: Behavior normal.        Thought Content: Thought content normal.        Judgment: Judgment normal.     ASSESSMENT/PLAN:    Problem List Items Addressed This Visit      Other   Post-menopausal bleeding - Primary   Relevant Orders   Surgical pathology   History of breast cancer    Will assess for uterine cancer based on risks of PMB and Tamoxifen use PAP also done today as is soon due  Endometrial Biopsy After discussion with the patient regarding her abnormal uterine bleeding I recommended that she proceed with an endometrial biopsy for further diagnosis. The risks, benefits, alternatives, and indications for an endometrial biopsy were discussed with the patient in detail. She understood the risks including infection, bleeding, cervical laceration and uterine perforation.  Verbal consent was obtained.   PROCEDURE NOTE:  [place] endometrial biopsy was performed using aseptic technique with iodine preparation.  The uterus was sounded to a length of 7 cm.  Adequate sampling was obtained with minimal blood loss.  The patient tolerated the procedure well.  Disposition will be pending pathology.  Barnett Applebaum, MD,  [facility], [facility] 09/11/2020  11:51 AM

## 2020-09-12 LAB — SURGICAL PATHOLOGY

## 2020-09-12 LAB — CYTOLOGY - PAP
Comment: NEGATIVE
Diagnosis: NEGATIVE
Diagnosis: REACTIVE
High risk HPV: NEGATIVE

## 2020-09-14 ENCOUNTER — Encounter: Payer: Self-pay | Admitting: Oncology

## 2020-09-14 ENCOUNTER — Other Ambulatory Visit: Payer: Self-pay | Admitting: Oncology

## 2020-09-14 MED ORDER — DICLOFENAC SODIUM 1 % EX GEL: 2.0000 g | Freq: Four times a day (QID) | CUTANEOUS | 3 refills | Status: DC

## 2020-09-21 DIAGNOSIS — F410 Panic disorder [episodic paroxysmal anxiety] without agoraphobia: Secondary | ICD-10-CM | POA: Diagnosis not present

## 2020-09-21 DIAGNOSIS — F411 Generalized anxiety disorder: Secondary | ICD-10-CM | POA: Diagnosis not present

## 2020-09-28 ENCOUNTER — Telehealth: Payer: Self-pay

## 2020-09-28 ENCOUNTER — Inpatient Hospital Stay: Payer: Medicaid Other

## 2020-09-28 MED ORDER — GOSERELIN ACETATE 3.6 MG ~~LOC~~ IMPL
3.6000 mg | DRUG_IMPLANT | Freq: Once | SUBCUTANEOUS | Status: AC
Start: 2020-09-28 — End: ?
  Filled 2020-09-28: qty 3.6

## 2020-09-28 NOTE — Telephone Encounter (Signed)
Per [place] from [place]: Patient is here for her Zoladex injection and had some issues with it last month. She says that after her eye surgery they took a picture of her heart and told her part of it was enlarged. She states that she sent a message through MyChart asking to make sure that the Zoladex injection does not have anything to do with her enlarged heart. She states that she was told that the message would be passed along to Dr. Tasia Catchings but never heard anything else back and wants to make sure before she receives her injection today.   Dr. Tasia Catchings: It is uncommon for zoledex to cause direct cardiovascular problems, but certainly possible indirectly related to heart problem when her estrogen is being suppressed.  many other condition can cause enlarged heart. hold off today's zoledex, postpone her next Zoledex to be a few days after 3/18 after she sees cardiology. Please mover her future appt accordingly. zoledex 4 weeks apart  zoladex has been r/s to 3/22 and lab/MD/ zoladex to 4/19.

## 2020-10-13 ENCOUNTER — Encounter: Payer: Self-pay | Admitting: Dietician

## 2020-10-13 NOTE — Progress Notes (Signed)
Have not heard back from patient to reschedule her cancelled appointment from 09/11/20. Sent notification to referring provider.

## 2020-10-24 ENCOUNTER — Ambulatory Visit: Payer: Medicaid Other | Admitting: Family Medicine

## 2020-10-26 ENCOUNTER — Ambulatory Visit: Payer: Medicaid Other

## 2020-10-27 DIAGNOSIS — E785 Hyperlipidemia, unspecified: Secondary | ICD-10-CM | POA: Diagnosis not present

## 2020-10-27 DIAGNOSIS — Z6841 Body Mass Index (BMI) 40.0 and over, adult: Secondary | ICD-10-CM | POA: Diagnosis not present

## 2020-10-27 DIAGNOSIS — G4733 Obstructive sleep apnea (adult) (pediatric): Secondary | ICD-10-CM | POA: Diagnosis not present

## 2020-10-27 DIAGNOSIS — I959 Hypotension, unspecified: Secondary | ICD-10-CM | POA: Diagnosis not present

## 2020-10-27 DIAGNOSIS — I517 Cardiomegaly: Secondary | ICD-10-CM | POA: Diagnosis not present

## 2020-10-30 ENCOUNTER — Telehealth: Payer: Self-pay | Admitting: *Deleted

## 2020-10-30 ENCOUNTER — Ambulatory Visit: Payer: Medicaid Other
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
## 2020-10-30 NOTE — Telephone Encounter (Signed)
Call returned to patient and informed of doctor response. She said to let her think about it because she needs to be her best self right now and the last injection had her in a bad way for 2 weeks last time. She said she will call back when she decides

## 2020-10-31 ENCOUNTER — Inpatient Hospital Stay: Payer: Medicaid Other | Attending: Oncology

## 2020-11-07 ENCOUNTER — Ambulatory Visit: Payer: Medicaid Other | Admitting: Physician Assistant

## 2020-11-07 ENCOUNTER — Other Ambulatory Visit: Payer: Self-pay

## 2020-11-07 ENCOUNTER — Encounter: Payer: Self-pay | Admitting: Physician Assistant

## 2020-11-07 VITALS — BP 136/82 | HR 97 | Temp 98.5°F | Resp 16 | Ht 60.0 in | Wt 229.3 lb

## 2020-11-07 DIAGNOSIS — M62838 Other muscle spasm: Secondary | ICD-10-CM | POA: Diagnosis not present

## 2020-11-07 DIAGNOSIS — E785 Hyperlipidemia, unspecified: Secondary | ICD-10-CM | POA: Diagnosis not present

## 2020-11-07 DIAGNOSIS — R7303 Prediabetes: Secondary | ICD-10-CM | POA: Diagnosis not present

## 2020-11-07 DIAGNOSIS — Z6841 Body Mass Index (BMI) 40.0 and over, adult: Secondary | ICD-10-CM | POA: Diagnosis not present

## 2020-11-07 MED ORDER — CYCLOBENZAPRINE HCL 5 MG PO TABS: 5.0000 mg | ORAL_TABLET | Freq: Three times a day (TID) | ORAL | 0 refills | Status: DC | PRN
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
## 2020-11-07 NOTE — Patient Instructions (Signed)
Arthritis Arthritis means joint pain. It can also mean joint disease. A joint is a place where bones come together. There are more than 100 types of arthritis. What are the causes? This condition may be caused by:  Wear and tear of a joint. This is the most common cause.  A lot of acid in the blood, which leads to pain in the joint (gout).  Pain and swelling (inflammation) in a joint.  Infection of a joint.  Injuries in the joint.  A reaction to medicines (allergy). In some cases, the cause may not be known. What are the signs or symptoms? Symptoms of this condition include:  Redness at a joint.  Swelling at a joint.  Stiffness at a joint.  Warmth coming from the joint.  A fever.  A feeling of being sick. How is this treated? This condition may be treated with:  Treating the cause, if it is known.  Rest.  Raising (elevating) the joint.  Putting cold or hot packs on the joint.  Medicines to treat symptoms and reduce pain and swelling.  Shots of medicines (cortisone) into the joint. You may also be told to make changes in your life, such as doing exercises and losing weight. Follow these instructions at home: Medicines  Take over-the-counter and prescription medicines only as told by your doctor.  Do not take aspirin for pain if your doctor says that you may have gout. Activity  Rest your joint if your doctor tells you to.  Avoid activities that make the pain worse.  Exercise your joint regularly as told by your doctor. Try doing exercises like: ? Swimming. ? Water aerobics. ? Biking. ? Walking. Managing pain, stiffness, and swelling  If told, put ice on the affected area. ? Put ice in a plastic bag. ? Place a towel between your skin and the bag. ? Leave the ice on for 20 minutes, 2-3 times per day.  If your joint is swollen, raise (elevate) it above the level of your heart if told by your doctor.  If your joint feels stiff in the morning, try  taking a warm shower.  If told, put heat on the affected area. Do this as often as told by your doctor. Use the heat source that your doctor recommends, such as a moist heat pack or a heating pad. If you have diabetes, do not apply heat without asking your doctor. To apply heat: ? Place a towel between your skin and the heat source. ? Leave the heat on for 20-30 minutes. ? Remove the heat if your skin turns bright red. This is very important if you are unable to feel pain, heat, or cold. You may have a greater risk of getting burned.      General instructions  Do not use any products that contain nicotine or tobacco, such as cigarettes, e-cigarettes, and chewing tobacco. If you need help quitting, ask your doctor.  Keep all follow-up visits as told by your doctor. This is important. Contact a doctor if:  The pain gets worse.  You have a fever. Get help right away if:  You have very bad pain in your joint.  You have swelling in your joint.  Your joint is red.  Many joints become painful and swollen.  You have very bad back pain.  Your leg is very weak.  You cannot control your pee (urine) or poop (stool). Summary  Arthritis means joint pain. It can also mean joint disease. A joint is a  place where bones come together.  The most common cause of this condition is wear and tear of a joint.  Symptoms of this condition include redness, swelling, or stiffness of the joint.  This condition is treated with rest, raising the joint, medicines, and putting cold or hot packs on the joint.  Follow your doctor's instructions about medicines, activity, exercises, and other home care treatments. This information is not intended to replace advice given to you by your health care provider. Make sure you discuss any questions you have with your health care provider. Document Revised: 07/06/2018 Document Reviewed: 07/06/2018 Elsevier Patient Education  2021 [employer].

## 2020-11-23 DIAGNOSIS — F410 Panic disorder [episodic paroxysmal anxiety] without agoraphobia: Secondary | ICD-10-CM | POA: Diagnosis not present

## 2020-11-23 DIAGNOSIS — G4700 Insomnia, unspecified: Secondary | ICD-10-CM | POA: Diagnosis not present

## 2020-11-23 DIAGNOSIS — F411 Generalized anxiety disorder: Secondary | ICD-10-CM | POA: Diagnosis not present

## 2020-11-24 ENCOUNTER — Other Ambulatory Visit: Payer: Medicaid Other

## 2020-11-24 ENCOUNTER — Ambulatory Visit: Payer: Medicaid Other | Admitting: Oncology

## 2020-11-24 ENCOUNTER — Ambulatory Visit: Payer: Medicaid Other

## 2020-11-28 ENCOUNTER — Inpatient Hospital Stay: Payer: Medicaid Other

## 2020-11-28 ENCOUNTER — Inpatient Hospital Stay: Payer: Medicaid Other | Admitting: Oncology

## 2020-12-18 DIAGNOSIS — M159 Polyosteoarthritis, unspecified: Secondary | ICD-10-CM | POA: Diagnosis not present

## 2020-12-18 DIAGNOSIS — H16002 Unspecified corneal ulcer, left eye: Secondary | ICD-10-CM | POA: Diagnosis not present

## 2020-12-18 DIAGNOSIS — M3501 Sicca syndrome with keratoconjunctivitis: Secondary | ICD-10-CM | POA: Diagnosis not present

## 2020-12-29 DIAGNOSIS — M5440 Lumbago with sciatica, unspecified side: Secondary | ICD-10-CM | POA: Diagnosis not present

## 2021-01-03 DIAGNOSIS — R82998 Other abnormal findings in urine: Secondary | ICD-10-CM | POA: Diagnosis not present

## 2021-01-15 DIAGNOSIS — M3501 Sicca syndrome with keratoconjunctivitis: Secondary | ICD-10-CM | POA: Diagnosis not present

## 2021-01-15 DIAGNOSIS — H16002 Unspecified corneal ulcer, left eye: Secondary | ICD-10-CM | POA: Diagnosis not present

## 2021-01-15 DIAGNOSIS — M159 Polyosteoarthritis, unspecified: Secondary | ICD-10-CM | POA: Diagnosis not present

## 2021-01-22 DIAGNOSIS — H16002 Unspecified corneal ulcer, left eye: Secondary | ICD-10-CM | POA: Diagnosis not present

## 2021-01-22 DIAGNOSIS — M159 Polyosteoarthritis, unspecified: Secondary | ICD-10-CM | POA: Diagnosis not present

## 2021-01-22 DIAGNOSIS — M5440 Lumbago with sciatica, unspecified side: Secondary | ICD-10-CM | POA: Diagnosis not present

## 2021-01-25 DIAGNOSIS — G4700 Insomnia, unspecified: Secondary | ICD-10-CM | POA: Diagnosis not present

## 2021-01-25 DIAGNOSIS — F411 Generalized anxiety disorder: Secondary | ICD-10-CM | POA: Diagnosis not present

## 2021-01-25 DIAGNOSIS — F410 Panic disorder [episodic paroxysmal anxiety] without agoraphobia: Secondary | ICD-10-CM | POA: Diagnosis not present

## 2021-02-02 ENCOUNTER — Other Ambulatory Visit: Payer: Self-pay | Admitting: Oncology

## 2021-02-03 ENCOUNTER — Encounter: Payer: Self-pay | Admitting: Oncology

## 2021-02-08 DIAGNOSIS — M170 Bilateral primary osteoarthritis of knee: Secondary | ICD-10-CM | POA: Diagnosis not present

## 2021-02-27 ENCOUNTER — Encounter: Payer: Self-pay | Admitting: *Deleted

## 2021-02-27 NOTE — Telephone Encounter (Signed)
Opened in error

## 2021-03-08 DIAGNOSIS — F411 Generalized anxiety disorder: Secondary | ICD-10-CM | POA: Diagnosis not present

## 2021-03-08 DIAGNOSIS — F410 Panic disorder [episodic paroxysmal anxiety] without agoraphobia: Secondary | ICD-10-CM | POA: Diagnosis not present

## 2021-04-18 ENCOUNTER — Encounter: Payer: Self-pay | Admitting: *Deleted

## 2021-04-23 DIAGNOSIS — H16002 Unspecified corneal ulcer, left eye: Secondary | ICD-10-CM | POA: Diagnosis not present

## 2021-04-23 DIAGNOSIS — M159 Polyosteoarthritis, unspecified: Secondary | ICD-10-CM | POA: Diagnosis not present

## 2021-04-30 ENCOUNTER — Other Ambulatory Visit: Payer: Self-pay | Admitting: General Surgery

## 2021-04-30 DIAGNOSIS — C50212 Malignant neoplasm of upper-inner quadrant of left female breast: Secondary | ICD-10-CM

## 2021-04-30 DIAGNOSIS — Z170 Estrogen receptor positive status [ER+]: Secondary | ICD-10-CM

## 2021-05-03 DIAGNOSIS — F411 Generalized anxiety disorder: Secondary | ICD-10-CM | POA: Diagnosis not present

## 2021-05-10 ENCOUNTER — Other Ambulatory Visit: Payer: Self-pay

## 2021-05-10 ENCOUNTER — Encounter: Payer: Self-pay | Admitting: Family Medicine

## 2021-05-10 ENCOUNTER — Ambulatory Visit (INDEPENDENT_AMBULATORY_CARE_PROVIDER_SITE_OTHER): Payer: Medicaid Other | Admitting: Family Medicine

## 2021-05-10 VITALS — BP 116/68 | HR 86 | Temp 98.4°F | Resp 16 | Ht 60.0 in | Wt 232.3 lb

## 2021-05-10 DIAGNOSIS — F4323 Adjustment disorder with mixed anxiety and depressed mood: Secondary | ICD-10-CM

## 2021-05-10 DIAGNOSIS — Z5181 Encounter for therapeutic drug level monitoring: Secondary | ICD-10-CM | POA: Diagnosis not present

## 2021-05-10 DIAGNOSIS — D849 Immunodeficiency, unspecified: Secondary | ICD-10-CM

## 2021-05-10 DIAGNOSIS — E559 Vitamin D deficiency, unspecified: Secondary | ICD-10-CM | POA: Diagnosis not present

## 2021-05-10 DIAGNOSIS — M3500 Sicca syndrome, unspecified: Secondary | ICD-10-CM

## 2021-05-10 DIAGNOSIS — E785 Hyperlipidemia, unspecified: Secondary | ICD-10-CM | POA: Diagnosis not present

## 2021-05-10 DIAGNOSIS — R7303 Prediabetes: Secondary | ICD-10-CM | POA: Diagnosis not present

## 2021-05-10 DIAGNOSIS — D75839 Thrombocytosis, unspecified: Secondary | ICD-10-CM

## 2021-05-10 DIAGNOSIS — Z6841 Body Mass Index (BMI) 40.0 and over, adult: Secondary | ICD-10-CM

## 2021-05-10 DIAGNOSIS — M8589 Other specified disorders of bone density and structure, multiple sites: Secondary | ICD-10-CM | POA: Diagnosis not present

## 2021-05-10 DIAGNOSIS — Z79810 Long term (current) use of selective estrogen receptor modulators (SERMs): Secondary | ICD-10-CM | POA: Diagnosis not present

## 2021-05-10 MED ORDER — PAROXETINE HCL 20 MG PO TABS: 20.0000 mg | ORAL_TABLET | Freq: Every day | ORAL | 1 refills | Status: DC

## 2021-05-10 NOTE — Progress Notes (Signed)
Name: RYLEIGH ESQUEDA   MRN: [id number]    DOB: [birth year]   Date:05/10/2021       Progress Note  Chief Complaint  Patient presents with   Hyperlipidemia     Subjective:   BERKLEIGH BECKLES is a 60 y.o. female, presents to clinic for routine f/up  Hyperlipidemia: Currently treated with diet/lifestyle efforts, no meds Last Lipids: Lab Results  Component Value Date   CHOL 207 (H) 04/25/2020   HDL 49 (L) 04/25/2020   LDLCALC 129 (H) 04/25/2020   TRIG 176 (H) 04/25/2020   CHOLHDL 4.2 04/25/2020   - Denies: Chest pain, shortness of breath, myalgias, claudication  Obesity:  Weight up a little, no change with multiple efforts Wt Readings from Last 5 Encounters:  05/10/21 232 lb 4.8 oz (105.4 kg)  11/07/20 229 lb 4.8 oz (104 kg)  09/11/20 227 lb (103 kg)  08/25/20 228 lb 6.4 oz (103.6 kg)  07/24/20 (P) 226 lb 9.6 oz (102.8 kg)   BMI Readings from Last 5 Encounters:  05/10/21 45.37 kg/m  11/07/20 44.78 kg/m  09/11/20 44.33 kg/m  08/25/20 44.61 kg/m  07/24/20 (P) 44.25 kg/m    Prediabetes, Lab Results  Component Value Date   HGBA1C 5.7 (H) 04/25/2020  Was previously on long term steroids, not currently on per pt   Current Outpatient Medications:    acetaminophen (TYLENOL) 500 MG tablet, Take 1,000 mg by mouth every 8 (eight) hours as needed for mild pain or headache., Disp: , Rfl:    ALPHAGAN P 0.1 % SOLN, Place 1 drop into the left eye 3 (three) times daily., Disp: , Rfl:    anastrozole (ARIMIDEX) 1 MG tablet, Take 1 tablet by mouth once daily, Disp: 90 tablet, Rfl: 0   Ca Carbonate-Mag Hydroxide (ROLAIDS PO), Take 1 tablet as needed by mouth (indigestion). , Disp: , Rfl:    cholecalciferol (VITAMIN D) 1000 units tablet, Take 2,000 Units by mouth daily., Disp: , Rfl:    diclofenac (VOLTAREN) 50 MG EC tablet, Take 1 tablet by mouth 2 (two) times daily with a meal., Disp: , Rfl:    diclofenac Sodium (VOLTAREN) 1 % GEL, Apply 2 g topically 4 (four) times daily.,  Disp: 100 g, Rfl: 3   dorzolamide-timolol (COSOPT) 22.3-6.8 MG/ML ophthalmic solution, Place 1 drop into the left eye 2 (two) times daily., Disp: , Rfl:    doxycycline (VIBRA-TABS) 100 MG tablet, Take 100 mg by mouth daily., Disp: , Rfl:    ketoconazole (NIZORAL) 2 % cream, Apply 1 application topically daily., Disp: , Rfl:    ketoconazole (NIZORAL) 2 % shampoo, Apply 1 application topically 2 (two) times a week., Disp: 120 mL, Rfl: 3   moxifloxacin (VIGAMOX) 0.5 % ophthalmic solution, INSTILL 1 DROP INTO LEFT EYE 4 TIMES DAILY FOR 14 DAYS, Disp: , Rfl:    mycophenolate (CELLCEPT) 250 MG capsule, Take by mouth., Disp: , Rfl:    neomycin-polymyxin b-dexamethasone (MAXITROL) 3.5-10000-0.1 SUSP, , Disp: , Rfl:    ofloxacin (OCUFLOX) 0.3 % ophthalmic solution, Place 1 drop into the left eye 3 (three) times daily., Disp: , Rfl:    [START ON 07/23/2021] PARoxetine (PAXIL) 20 MG tablet, Take 1 tablet (20 mg total) by mouth daily., Disp: 90 tablet, Rfl: 1   prednisoLONE acetate (PRED FORTE) 1 % ophthalmic suspension, , Disp: , Rfl:    vitamin k 100 MCG tablet, Take 100 mcg by mouth 2 (two) times a week., Disp: , Rfl:  No current facility-administered medications for  this visit.  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   Combined form of senile cataract of right eye 02/11/2020   Cataract 12/14/2019   Post-menopausal bleeding 10/25/2019   Osteopenia 03/18/2019   Facial flushing 10/21/2018   Tinnitus of both ears 10/21/2018   Drug-induced neutropenia ([place]) 10/20/2018   Thrombocytosis 10/20/2018   Toxic multinodular goiter 06/29/2018   Irregular menstrual cycle 03/02/2018   Screening for cervical cancer 03/02/2018   Long-term current use of tamoxifen 02/17/2018    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   Anxiety 10/03/2017   Bilateral primary osteoarthritis of knee 10/03/2017   Peripheral ulcerative keratitis 10/01/2017   Malignant neoplasm of upper-inner quadrant of left breast in female, estrogen receptor positive ([place]) 05/22/2017   Hirsutism 11/24/2015   Hyperlipidemia 02/01/2014   Impaired glucose tolerance 02/01/2014   Vitamin D deficiency 02/01/2014   Family history of diabetes mellitus 01/31/2014   Morbid obesity with BMI of 40.0-44.9, adult ([place]) 01/31/2014   Sjogren's syndrome ([place]) 01/17/2014    Past Surgical History:  Procedure Laterality Date   BREAST BIOPSY Left 05/12/2017   [place] bx/ INVASIVE MAMMARY CARCINOMA   BREAST LUMPECTOMY Left 05/2017   invasive mammary carcinoma, neg margins   BREAST LUMPECTOMY WITH SENTINEL LYMPH NODE BIOPSY Left 06/10/2017   Procedure: BREAST LUMPECTOMY WITH SENTINEL LYMPH NODE BX AND NEEDLE LOCALIZATION;  Surgeon: Robert Bellow, MD;  Location: [facility];  Service: General;  Laterality: Left;   BREAST MAMMOSITE Left 06/24/2017   Procedure: MAMMOSITE BREAST;  Surgeon: Robert Bellow, MD;  Location: [facility];  Service: General;  Laterality: Left;   [place]  11/2017   [facility]   COSMETIC SURGERY     CYST EXCISION  2018   pilar cyst/ Dr Will Bonnet   EYE SURGERY     PORTACATH PLACEMENT Right 07/08/2017   Procedure: INSERTION PORT-A-CATH;  Surgeon: Robert Bellow, MD;  Location: [facility];  Service: General;  Laterality: Right;   TONSILLECTOMY      Family History  Problem Relation Age of Onset   Breast cancer Maternal Aunt 14       currently ~65   Diabetes Mother    Skin cancer Mother        currently 51; TAH/BSO (age?)   Anemia Mother    Liver disease Father        deceased / not much info about him / alcoholic   Lung cancer Paternal Aunt        smoker / deceased 63s   Stomach cancer Paternal Uncle 85       deceased / deceased 26s   Ovarian cancer  Maternal Grandmother 92       deceased 76s   Stomach cancer Paternal Grandfather 72       deceased 12s   Cancer Maternal Uncle        unk. type; deceased 55s   Leukemia Cousin 17       deceased 63    Social History   Tobacco Use   Smoking status: Former    Years: 7.00    Types: Cigarettes    Quit date: 08/13/1979    Years since quitting: 41.7   Smokeless tobacco: Never   Tobacco comments:  AGE [age]  Vaping Use   Vaping Use: Never used  Substance Use Topics   Alcohol use: No   Drug use: No     Allergies  Allergen Reactions   Rituximab Anaphylaxis   Benadryl [Diphenhydramine] Other (See Comments)    Patient felt like she was going to "crawl out of her skin"   Cortisone Other (See Comments)    Also reacts to cortisone injections; they make her flushed   Diphenhydramine Hcl    Clonazepam Anxiety   Sulfamethoxazole-Trimethoprim Rash    Chest tightness. Bactrim    Health Maintenance  Topic Date Due   MAMMOGRAM  05/08/2021   COVID-19 Vaccine (1) 05/26/2021 (Originally 08/03/1961)   Zoster Vaccines- Shingrix (1 of 2) 08/09/2021 (Originally 02/02/1980)   INFLUENZA VACCINE  11/09/2021 (Originally 03/12/2021)   PAP SMEAR-Modifier  09/12/2023   COLONOSCOPY (Pts 45-80yrs Insurance coverage will need to be confirmed)  12/14/2027   TETANUS/TDAP  07/21/2028   Hepatitis C Screening  Completed   HIV Screening  Completed   HPV VACCINES  Aged Out    Chart Review Today: I personally reviewed active problem list, medication list, allergies, family history, social history, health maintenance, notes from last encounter, lab results, imaging with the patient/caregiver today.   Review of Systems  Constitutional: Negative.   HENT: Negative.    Eyes: Negative.   Respiratory: Negative.    Cardiovascular: Negative.   Gastrointestinal: Negative.   Endocrine: Negative.   Genitourinary: Negative.   Musculoskeletal: Negative.   Skin: Negative.   Allergic/Immunologic: Negative.    Neurological: Negative.   Hematological: Negative.   Psychiatric/Behavioral: Negative.    All other systems reviewed and are negative.   Objective:   Vitals:   05/10/21 1328  BP: 116/68  Pulse: 86  Resp: 16  Temp: 98.4 F (36.9 C)  SpO2: 97%  Weight: 232 lb 4.8 oz (105.4 kg)  Height: 5' (1.524 m)    Body mass index is 45.37 kg/m.  Physical Exam Vitals reviewed.  Constitutional:      General: She is not in acute distress.    Appearance: She is obese. She is not ill-appearing, toxic-appearing or diaphoretic.  HENT:     Head: Normocephalic and atraumatic.  Eyes:     General: No scleral icterus. Cardiovascular:     Rate and Rhythm: Normal rate and regular rhythm.     Pulses: Normal pulses.     Heart sounds: Normal heart sounds. No murmur heard.   No friction rub. No gallop.  Pulmonary:     Effort: Pulmonary effort is normal. No respiratory distress.     Breath sounds: Normal breath sounds. No stridor. No wheezing, rhonchi or rales.  Abdominal:     General: Bowel sounds are normal.     Palpations: Abdomen is soft.  Skin:    General: Skin is warm.     Coloration: Skin is not jaundiced or pale.     Findings: No rash.  Neurological:     Mental Status: She is alert. Mental status is at baseline.     Gait: Gait normal.  Psychiatric:        Mood and Affect: Mood normal.        Behavior: Behavior normal.        Assessment & Plan:     ICD-10-CM   1. Hyperlipidemia, unspecified hyperlipidemia type  E78.5 Lipid panel    COMPLETE METABOLIC PANEL WITH GFR   discussed diet/lifestyle efforts, reviewing lipid panel and reassessing ASCVD risk and advising  on need for statin    2. Prediabetes  R73.03 Hemoglobin A1C    COMPLETE METABOLIC PANEL WITH GFR   with PCOS, morbid obesity, metabolic dysfunction, may want to try meds, due for recheck of labs    3. Class 3 severe obesity with serious comorbidity and body mass index (BMI) of 45.0 to 49.9 in adult, unspecified  obesity type (HCC)  E66.01 Lipid panel   Z68.42 Hemoglobin A1C    COMPLETE METABOLIC PANEL WITH GFR   considering bariatric surgery, return to discuss meds or other med weight management?    4. Immunocompromised ([place])  D84.9 CANCELED: CBC with Differential/Platelet    5. Vitamin D deficiency  Z00.9 COMPLETE METABOLIC PANEL WITH GFR   improved levels, last were normal, continue supplement    6. Sjogren's syndrome, with unspecified organ involvement ([place])  M35.00     7. Osteopenia of multiple sites  M85.89    monitoring calcium and vit d, increase weight bearing activity, may need dexa soon due to longterm steroid use    8. Thrombocytosis  D75.839 CANCELED: CBC with Differential/Platelet   monitored by hem/onc, reviewed last labs    9. Long-term current use of tamoxifen  Q33.007 COMPLETE METABOLIC PANEL WITH GFR    CANCELED: CBC with Differential/Platelet   per onc    10. Encounter for medication monitoring  Z51.81 Lipid panel    Hemoglobin A1C    COMPLETE METABOLIC PANEL WITH GFR    CANCELED: CBC with Differential/Platelet    11. Adjustment disorder with mixed anxiety and depressed mood  F43.23 PARoxetine (PAXIL) 20 MG tablet   currently well controlled with paxil 20 mg, would like pcp to take over RX, was seeing psych, but stopping and going to therapist instead       Return for 6 months HLD prediabetes OSA (may need sooner if new meds started).   Delsa Grana, PA-C 05/10/21 2:02 PM

## 2021-05-18 ENCOUNTER — Ambulatory Visit
Admission: RE | Admit: 2021-05-18 | Discharge: 2021-05-18 | Disposition: A | Discharge status: Home / Self Care | Payer: Medicaid Other | Source: Ambulatory Visit | Attending: General Surgery | Admitting: General Surgery

## 2021-05-18 ENCOUNTER — Other Ambulatory Visit: Payer: Self-pay | Admitting: Oncology

## 2021-05-18 ENCOUNTER — Other Ambulatory Visit: Payer: Self-pay

## 2021-05-18 DIAGNOSIS — Z170 Estrogen receptor positive status [ER+]: Secondary | ICD-10-CM | POA: Diagnosis not present

## 2021-05-18 DIAGNOSIS — D849 Immunodeficiency, unspecified: Secondary | ICD-10-CM | POA: Diagnosis not present

## 2021-05-18 DIAGNOSIS — C50212 Malignant neoplasm of upper-inner quadrant of left female breast: Secondary | ICD-10-CM | POA: Diagnosis not present

## 2021-05-18 DIAGNOSIS — E559 Vitamin D deficiency, unspecified: Secondary | ICD-10-CM | POA: Diagnosis not present

## 2021-05-18 DIAGNOSIS — Z6841 Body Mass Index (BMI) 40.0 and over, adult: Secondary | ICD-10-CM | POA: Diagnosis not present

## 2021-05-18 DIAGNOSIS — E785 Hyperlipidemia, unspecified: Secondary | ICD-10-CM | POA: Diagnosis not present

## 2021-05-18 DIAGNOSIS — R7303 Prediabetes: Secondary | ICD-10-CM | POA: Diagnosis not present

## 2021-05-18 DIAGNOSIS — D75839 Thrombocytosis, unspecified: Secondary | ICD-10-CM | POA: Diagnosis not present

## 2021-05-18 DIAGNOSIS — Z1231 Encounter for screening mammogram for malignant neoplasm of breast: Secondary | ICD-10-CM | POA: Insufficient documentation

## 2021-05-18 DIAGNOSIS — Z79810 Long term (current) use of selective estrogen receptor modulators (SERMs): Secondary | ICD-10-CM | POA: Diagnosis not present

## 2021-05-18 DIAGNOSIS — Z5181 Encounter for therapeutic drug level monitoring: Secondary | ICD-10-CM | POA: Diagnosis not present

## 2021-05-19 LAB — COMPLETE METABOLIC PANEL WITH GFR
AG Ratio: 1.9 (calc) (ref 1.0–2.5)
ALT: 17 U/L (ref 6–29)
AST: 15 U/L (ref 10–35)
Albumin: 4 g/dL (ref 3.6–5.1)
Alkaline phosphatase (APISO): 109 U/L (ref 37–153)
BUN: 16 mg/dL (ref 7–25)
CO2: 27 mmol/L (ref 20–32)
Calcium: 9.2 mg/dL (ref 8.6–10.4)
Chloride: 106 mmol/L (ref 98–110)
Creat: 0.65 mg/dL (ref 0.50–1.05)
Globulin: 2.1 g/dL (calc) (ref 1.9–3.7)
Glucose, Bld: 95 mg/dL (ref 65–99)
Potassium: 4.3 mmol/L (ref 3.5–5.3)
Sodium: 139 mmol/L (ref 135–146)
Total Bilirubin: 0.4 mg/dL (ref 0.2–1.2)
Total Protein: 6.1 g/dL (ref 6.1–8.1)
eGFR: 101 mL/min/{1.73_m2} (ref >=60)

## 2021-05-19 LAB — CBC WITH DIFFERENTIAL/PLATELET
Absolute Monocytes: 588 cells/uL (ref 200–950)
Basophils Absolute: 21 cells/uL (ref 0–200)
Basophils Relative: 0.4 %
Eosinophils Absolute: 90 cells/uL (ref 15–500)
Eosinophils Relative: 1.7 %
HCT: 39.2 % (ref 35.0–45.0)
Hemoglobin: 12.6 g/dL (ref 11.7–15.5)
Lymphs Abs: 2067 cells/uL (ref 850–3900)
MCH: 30.4 pg (ref 27.0–33.0)
MCHC: 32.1 g/dL (ref 32.0–36.0)
MCV: 94.7 fL (ref 80.0–100.0)
MPV: 9.3 fL (ref 7.5–12.5)
Monocytes Relative: 11.1 %
Neutro Abs: 2533 cells/uL (ref 1500–7800)
Neutrophils Relative %: 47.8 %
Platelets: 325 10*3/uL (ref 140–400)
RBC: 4.14 10*6/uL (ref 3.80–5.10)
RDW: 12.9 % (ref 11.0–15.0)
Total Lymphocyte: 39 %
WBC: 5.3 10*3/uL (ref 3.8–10.8)

## 2021-05-19 LAB — LIPID PANEL
Cholesterol: 232 mg/dL — ABNORMAL HIGH (ref <200)
HDL: 41 mg/dL — ABNORMAL LOW (ref >=50)
LDL Cholesterol (Calc): 154 mg/dL (calc) — ABNORMAL HIGH
Non-HDL Cholesterol (Calc): 191 mg/dL (calc) — ABNORMAL HIGH (ref <130)
Total CHOL/HDL Ratio: 5.7 (calc) — ABNORMAL HIGH (ref <5.0)
Triglycerides: 208 mg/dL — ABNORMAL HIGH (ref <150)

## 2021-05-19 LAB — HEMOGLOBIN A1C
Hgb A1c MFr Bld: 5.5 % of total Hgb (ref <5.7)
Mean Plasma Glucose: 111 mg/dL
eAG (mmol/L): 6.2 mmol/L

## 2021-05-22 ENCOUNTER — Other Ambulatory Visit: Payer: Self-pay | Admitting: *Deleted

## 2021-05-22 MED ORDER — ANASTROZOLE 1 MG PO TABS: 1.0000 mg | ORAL_TABLET | Freq: Every day | ORAL | 0 refills | Status: DC

## 2021-05-22 NOTE — Telephone Encounter (Signed)
Patient agreed to schedule follow up appointment and was transferred to scheduler. I sent in # 30 tabs and advised her that if she does not see doctor in that 30 days that we will not refill medicine again until she is seen

## 2021-05-24 DIAGNOSIS — Z1211 Encounter for screening for malignant neoplasm of colon: Secondary | ICD-10-CM | POA: Diagnosis not present

## 2021-05-24 DIAGNOSIS — Z853 Personal history of malignant neoplasm of breast: Secondary | ICD-10-CM | POA: Diagnosis not present

## 2021-05-29 DIAGNOSIS — E042 Nontoxic multinodular goiter: Secondary | ICD-10-CM | POA: Diagnosis not present

## 2021-06-05 DIAGNOSIS — E0520 Thyrotoxicosis with toxic multinodular goiter without thyrotoxic crisis or storm: Secondary | ICD-10-CM | POA: Diagnosis not present

## 2021-06-05 DIAGNOSIS — E042 Nontoxic multinodular goiter: Secondary | ICD-10-CM | POA: Diagnosis not present

## 2021-06-15 ENCOUNTER — Other Ambulatory Visit: Payer: Self-pay

## 2021-06-15 DIAGNOSIS — C50212 Malignant neoplasm of upper-inner quadrant of left female breast: Secondary | ICD-10-CM

## 2021-06-15 DIAGNOSIS — Z170 Estrogen receptor positive status [ER+]: Secondary | ICD-10-CM

## 2021-06-18 ENCOUNTER — Inpatient Hospital Stay: Payer: Medicaid Other | Attending: Oncology

## 2021-06-18 ENCOUNTER — Other Ambulatory Visit: Payer: Self-pay

## 2021-06-18 ENCOUNTER — Inpatient Hospital Stay: Payer: Medicaid Other

## 2021-06-18 ENCOUNTER — Inpatient Hospital Stay (HOSPITAL_BASED_OUTPATIENT_CLINIC_OR_DEPARTMENT_OTHER): Payer: Medicaid Other | Admitting: Oncology

## 2021-06-18 ENCOUNTER — Encounter: Payer: Self-pay | Admitting: Oncology

## 2021-06-18 VITALS — BP 140/79 | HR 71 | Temp 97.2°F | Resp 16 | Wt 230.8 lb

## 2021-06-18 DIAGNOSIS — Z832 Family history of diseases of the blood and blood-forming organs and certain disorders involving the immune mechanism: Secondary | ICD-10-CM | POA: Diagnosis not present

## 2021-06-18 DIAGNOSIS — F419 Anxiety disorder, unspecified: Secondary | ICD-10-CM | POA: Insufficient documentation

## 2021-06-18 DIAGNOSIS — C50212 Malignant neoplasm of upper-inner quadrant of left female breast: Secondary | ICD-10-CM

## 2021-06-18 DIAGNOSIS — Z170 Estrogen receptor positive status [ER+]: Secondary | ICD-10-CM | POA: Insufficient documentation

## 2021-06-18 DIAGNOSIS — Z8041 Family history of malignant neoplasm of ovary: Secondary | ICD-10-CM | POA: Diagnosis not present

## 2021-06-18 DIAGNOSIS — Z8379 Family history of other diseases of the digestive system: Secondary | ICD-10-CM | POA: Diagnosis not present

## 2021-06-18 DIAGNOSIS — Z79899 Other long term (current) drug therapy: Secondary | ICD-10-CM | POA: Insufficient documentation

## 2021-06-18 DIAGNOSIS — Z800 Family history of malignant neoplasm of digestive organs: Secondary | ICD-10-CM | POA: Diagnosis not present

## 2021-06-18 DIAGNOSIS — Z888 Allergy status to other drugs, medicaments and biological substances status: Secondary | ICD-10-CM | POA: Diagnosis not present

## 2021-06-18 DIAGNOSIS — Z806 Family history of leukemia: Secondary | ICD-10-CM | POA: Insufficient documentation

## 2021-06-18 DIAGNOSIS — E0590 Thyrotoxicosis, unspecified without thyrotoxic crisis or storm: Secondary | ICD-10-CM | POA: Insufficient documentation

## 2021-06-18 DIAGNOSIS — Z803 Family history of malignant neoplasm of breast: Secondary | ICD-10-CM | POA: Insufficient documentation

## 2021-06-18 DIAGNOSIS — E282 Polycystic ovarian syndrome: Secondary | ICD-10-CM | POA: Diagnosis not present

## 2021-06-18 DIAGNOSIS — Z923 Personal history of irradiation: Secondary | ICD-10-CM | POA: Insufficient documentation

## 2021-06-18 DIAGNOSIS — M8580 Other specified disorders of bone density and structure, unspecified site: Secondary | ICD-10-CM | POA: Diagnosis not present

## 2021-06-18 DIAGNOSIS — Z881 Allergy status to other antibiotic agents status: Secondary | ICD-10-CM | POA: Diagnosis not present

## 2021-06-18 DIAGNOSIS — M3500 Sicca syndrome, unspecified: Secondary | ICD-10-CM | POA: Diagnosis not present

## 2021-06-18 DIAGNOSIS — Z801 Family history of malignant neoplasm of trachea, bronchus and lung: Secondary | ICD-10-CM | POA: Insufficient documentation

## 2021-06-18 DIAGNOSIS — Z79810 Long term (current) use of selective estrogen receptor modulators (SERMs): Secondary | ICD-10-CM | POA: Diagnosis not present

## 2021-06-18 DIAGNOSIS — Z560 Unemployment, unspecified: Secondary | ICD-10-CM | POA: Insufficient documentation

## 2021-06-18 DIAGNOSIS — Z833 Family history of diabetes mellitus: Secondary | ICD-10-CM | POA: Diagnosis not present

## 2021-06-18 DIAGNOSIS — Z87891 Personal history of nicotine dependence: Secondary | ICD-10-CM | POA: Diagnosis not present

## 2021-06-18 LAB — COMPREHENSIVE METABOLIC PANEL WITH GFR
ALT: 22 U/L (ref 0–44)
AST: 21 U/L (ref 15–41)
Albumin: 3.8 g/dL (ref 3.5–5.0)
Alkaline Phosphatase: 115 U/L (ref 38–126)
Anion gap: 6 (ref 5–15)
BUN: 18 mg/dL (ref 6–20)
CO2: 28 mmol/L (ref 22–32)
Calcium: 8.8 mg/dL — ABNORMAL LOW (ref 8.9–10.3)
Chloride: 103 mmol/L (ref 98–111)
Creatinine, Ser: 0.75 mg/dL (ref 0.44–1.00)
GFR, Estimated: >60 mL/min
Glucose, Bld: 114 mg/dL — ABNORMAL HIGH (ref 70–99)
Potassium: 4.2 mmol/L (ref 3.5–5.1)
Sodium: 137 mmol/L (ref 135–145)
Total Bilirubin: 0.5 mg/dL (ref 0.3–1.2)
Total Protein: 6.8 g/dL (ref 6.5–8.1)

## 2021-06-18 LAB — CBC WITH DIFFERENTIAL/PLATELET
Abs Immature Granulocytes: 0.01 10*3/uL (ref 0.00–0.07)
Basophils Absolute: 0 10*3/uL (ref 0.0–0.1)
Basophils Relative: 0 %
Eosinophils Absolute: 0.1 10*3/uL (ref 0.0–0.5)
Eosinophils Relative: 2 %
HCT: 40.1 % (ref 36.0–46.0)
Hemoglobin: 13.3 g/dL (ref 12.0–15.0)
Immature Granulocytes: 0 %
Lymphocytes Relative: 36 %
Lymphs Abs: 2.2 10*3/uL (ref 0.7–4.0)
MCH: 31 pg (ref 26.0–34.0)
MCHC: 33.2 g/dL (ref 30.0–36.0)
MCV: 93.5 fL (ref 80.0–100.0)
Monocytes Absolute: 0.6 10*3/uL (ref 0.1–1.0)
Monocytes Relative: 9 %
Neutro Abs: 3.1 10*3/uL (ref 1.7–7.7)
Neutrophils Relative %: 53 %
Platelets: 305 10*3/uL (ref 150–400)
RBC: 4.29 MIL/uL (ref 3.87–5.11)
RDW: 14.1 % (ref 11.5–15.5)
WBC: 6 10*3/uL (ref 4.0–10.5)
nRBC: 0 % (ref 0.0–0.2)

## 2021-06-18 MED ORDER — ANASTROZOLE 1 MG PO TABS: 1.0000 mg | ORAL_TABLET | Freq: Every day | ORAL | 0 refills | Status: DC

## 2021-06-18 NOTE — Progress Notes (Signed)
Hematology/Oncology Follow up note  [phone] [phone]   Patient Care Team: Delsa Grana, PA-C as PCP - General (Family Medicine) Rico Junker, RN as Registered Nurse Theodore Demark, RN as Registered Nurse Byrnett, Forest Gleason, MD (General Surgery) Noreene Filbert, MD as Referring Physician (Radiation Oncology) Earlie Server, MD as Consulting Physician (Oncology) Shon Hough, MD as Referring Physician (Endocrinology)  REFERRING PROVIDER: Delsa Grana, PA-C REASON FOR VISIT Follow up for breast cancer  HISTORY OF PRESENTING ILLNESS:  Sheena Martinez is a  60 y.o.  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 Margin is negative, no LVI. Mammoprintr revealed 10 year recurrence rate at 29% high risk group. Patient has completed MammoSite RT. Denies any hormonal replacement therapy.  Adjuvant chemotherapy with TC, finished 3 cycles.  She is perimenopausal.   # His Sjogren disease for which she has had tear duct surgery. She also reports history of PCOS with elevated testosterone level.   Genetic testing:  Genetic testing result vis [facility] showed no pathogenic sequence appearance or deletions /duplications.  # she developed corneal ulcer for which she has to undergo emergency ophthalmology surgery.  This is considered to be related to Sjogren's syndrome.  Patient has been started on prednisone by ophthalmologist.  Her anxiety has not been well controlled lately due to her ophthalmology problems.  Patient has been on Paxil for a long time and recently switched to Lexapro as Paxil interfere with efficacy of tamoxifen.  She feels her anxiety is not well controlled.  Current Treatment Adjuvant TC x3. Patient declined cycle 4.  Started on Tamoxifen in March 2019. Declined option of ovarian suppression with aromatase inhibitor as patient is concerned about having new side effects.   INTERVAL HISTORY 60  y.o. female  presented for follow up for breast cancer. Patient is taking Tamoxifen 61m daily. She has hot flash.  Follows up with Gyn and was recommended to try  black cohosh  She has not started yet and want to check with me if ok to be used with Tamoxifen.   [facility] and was previously on Zoloft and Ativan. Recently Zoloft was changed to Effexor.  Hyperthyroidism, on methimazole.  Follows up with endocrinology. Cornea ulcer, s/p cornea transplant. Currently on steroid eye drop Denies any fever, chills, chest pain, shortness of breath, abdominal pain, back pain, lower extremity swelling. Denies any concern of her breast.  She has had bilateral diagnostic mammogram done on 05/06/2019  which did not show malignant findings. Bone density 03/15/2019 showed osteopenia.    INTERVAL HISTORY Sheena BEALis a 60y.o. female who has above history reviewed by me today presents for follow up visit for management of breast cancer.  Problems and complaints are listed below: She is back on Paxil.  Was started on Zoledex + Arimidex. She reports generalized body aches after Zoledex treatment.  She called and cancelled subsequent injection appt and follow up appt due to her husband's MVA.    Review of Systems  Constitutional:  Negative for appetite change, chills, fatigue and fever.  HENT:   Negative for hearing loss and voice change.   Eyes:  Negative for eye problems.  Respiratory:  Negative for chest tightness and cough.   Cardiovascular:  Negative for chest pain.  Gastrointestinal:  Negative for abdominal distention, abdominal pain and blood in stool.  Endocrine: Positive for hot flashes.  Genitourinary:  Negative for  difficulty urinating and frequency.   Musculoskeletal:  Positive for arthralgias.  Skin:  Negative for itching and rash.  Neurological:  Negative for extremity weakness.  Hematological:  Negative for adenopathy.   Psychiatric/Behavioral:  Negative for confusion. The patient is nervous/anxious.    MEDICAL HISTORY:  Past Medical History:  Diagnosis Date   Anxiety    Arthritis    Breast cancer (HCC) 05/12/2017   INVASIVE MAMMARY CARCINOMA ER/PR positive LEFT BREAST UPPER inner  QUAD   Cataract 01/11/2020   left   Corneal perforation of left eye 10/09/2017   Overview:  Added automatically from request for surgery [id number]   GERD (gastroesophageal reflux disease)    OCC   Goiter    Hematuria 10/21/2018   Hyperthyroidism    Personal history of radiation therapy 2018   LEFT lumpectomy   Sjogren's syndrome (HCC)    Uses continuous positive airway pressure (CPAP) ventilation at home 04/30/2019    SURGICAL HISTORY: Past Surgical History:  Procedure Laterality Date   BREAST BIOPSY Left 05/12/2017   us bx/ INVASIVE MAMMARY CARCINOMA   BREAST LUMPECTOMY Left 05/2017   invasive mammary carcinoma, neg margins   BREAST LUMPECTOMY WITH SENTINEL LYMPH NODE BIOPSY Left 06/10/2017   Procedure: BREAST LUMPECTOMY WITH SENTINEL LYMPH NODE BX AND NEEDLE LOCALIZATION;  Surgeon: Byrnett, Jeffrey W, MD;  Location: [facility];  Service: General;  Laterality: Left;   BREAST MAMMOSITE Left 06/24/2017   Procedure: MAMMOSITE BREAST;  Surgeon: Byrnett, Jeffrey W, MD;  Location: [facility];  Service: General;  Laterality: Left;   CESAREAN SECTION  1995   CORNEAL TRANSPLANT  11/2017   [facility]   COSMETIC SURGERY     CYST EXCISION  2018   pilar cyst/ Dr Culton   EYE SURGERY     PORTACATH PLACEMENT Right 07/08/2017   Procedure: INSERTION PORT-A-CATH;  Surgeon: Byrnett, Jeffrey W, MD;  Location: [facility];  Service: General;  Laterality: Right;   TONSILLECTOMY      SOCIAL HISTORY: Social History   Socioeconomic History   Marital status: Married    Spouse name: Glenn   Number of children: 3   Years of education: Not on file   Highest education level: GED or equivalent  Occupational History   Occupation: unemployed   Tobacco Use   Smoking status: Former    Years: 7.00    Types: Cigarettes    Quit date: 08/13/1979    Years since quitting: 41.8   Smokeless tobacco: Never   Tobacco comments:    [age]  Vaping Use   Vaping Use: Never used  Substance and Sexual Activity   Alcohol use: No   Drug use: No   Sexual activity: Yes    Partners: Male  Other Topics Concern   Not on file  Social History Narrative   Not on file   Social Determinants of Health   Financial Resource Strain: Not on file  Food Insecurity: Not on file  Transportation Needs: Not on file  Physical Activity: Not on file  Stress: Not on file  Social Connections: Not on file  Intimate Partner Violence: Not on file    FAMILY HISTORY: Family History  Problem Relation Age of Onset   Breast cancer Maternal Aunt 45       currently ~65   Diabetes Mother    Skin cancer Mother        currently 76; TAH/BSO (age?)   Anemia Mother    Liver disease Father          deceased / not much info about him / alcoholic   Lung cancer Paternal Aunt        smoker / deceased 40s   Stomach cancer Paternal Uncle 55       deceased / deceased 50s   Ovarian cancer Maternal Grandmother 92       deceased 90s   Stomach cancer Paternal Grandfather 65       deceased 60s   Cancer Maternal Uncle        unk. type; deceased 40s   Leukemia Cousin 57       deceased 57    ALLERGIES:  is allergic to rituximab, benadryl [diphenhydramine], cortisone, diphenhydramine hcl, clonazepam, and sulfamethoxazole-trimethoprim.  MEDICATIONS:  Current Outpatient Medications  Medication Sig Dispense Refill   acetaminophen (TYLENOL) 500 MG tablet Take 1,000 mg by mouth every 8 (eight) hours as needed for mild pain or headache.     ALPHAGAN P 0.1 % SOLN Place 1 drop into the left eye 3 (three) times daily.     Ca Carbonate-Mag Hydroxide (ROLAIDS PO) Take 1 tablet as needed by mouth (indigestion).      cholecalciferol (VITAMIN D) 1000 units tablet Take 2,000 Units by  mouth daily.     diclofenac (VOLTAREN) 50 MG EC tablet Take 1 tablet by mouth 2 (two) times daily with a meal.     diclofenac Sodium (VOLTAREN) 1 % GEL Apply 2 g topically 4 (four) times daily. 100 g 3   dorzolamide-timolol (COSOPT) 22.3-6.8 MG/ML ophthalmic solution Place 1 drop into the left eye 2 (two) times daily.     doxycycline (VIBRA-TABS) 100 MG tablet Take 100 mg by mouth daily.     ketoconazole (NIZORAL) 2 % cream Apply 1 application topically daily.     ketoconazole (NIZORAL) 2 % shampoo Apply 1 application topically 2 (two) times a week. 120 mL 3   moxifloxacin (VIGAMOX) 0.5 % ophthalmic solution INSTILL 1 DROP INTO LEFT EYE 4 TIMES DAILY FOR 14 DAYS     mycophenolate (CELLCEPT) 250 MG capsule Take by mouth.     neomycin-polymyxin b-dexamethasone (MAXITROL) 3.5-10000-0.1 SUSP      ofloxacin (OCUFLOX) 0.3 % ophthalmic solution Place 1 drop into the left eye 3 (three) times daily.     [START ON 07/23/2021] PARoxetine (PAXIL) 20 MG tablet Take 1 tablet (20 mg total) by mouth daily. 90 tablet 1   prednisoLONE acetate (PRED FORTE) 1 % ophthalmic suspension      vitamin k 100 MCG tablet Take 100 mcg by mouth 2 (two) times a week.     anastrozole (ARIMIDEX) 1 MG tablet Take 1 tablet (1 mg total) by mouth daily. 30 tablet 0   No current facility-administered medications for this visit.   Facility-Administered Medications Ordered in Other Visits  Medication Dose Route Frequency Provider Last Rate Last Admin   goserelin (ZOLADEX) injection 3.6 mg  3.6 mg Subcutaneous Once Yu, Zhou, MD          .  PHYSICAL EXAMINATION: ECOG PERFORMANCE STATUS: 1 - Symptomatic but completely ambulatory Vitals:   06/18/21 1400  BP: 140/79  Pulse: 71  Resp: 16  Temp: (!) 97.2 F (36.2 C)  SpO2: 97%   Physical Exam Constitutional:      General: She is not in acute distress.    Appearance: She is not diaphoretic.     Comments: Obese.   HENT:     Head: Normocephalic and atraumatic.     Nose:  Nose normal.       Mouth/Throat:     Pharynx: No oropharyngeal exudate.  Eyes:     General: No scleral icterus.    Conjunctiva/sclera: Conjunctivae normal.     Pupils: Pupils are equal, round, and reactive to light.  Neck:     Thyroid: No thyromegaly.  Cardiovascular:     Rate and Rhythm: Normal rate and regular rhythm.     Heart sounds: Normal heart sounds. No murmur heard. Pulmonary:     Effort: Pulmonary effort is normal. No respiratory distress.     Breath sounds: Normal breath sounds. No wheezing or rales.  Chest:     Chest wall: No tenderness.  Abdominal:     General: Bowel sounds are normal. There is no distension.     Palpations: Abdomen is soft.     Tenderness: There is no abdominal tenderness.  Musculoskeletal:        General: Normal range of motion.     Cervical back: Normal range of motion and neck supple.  Lymphadenopathy:     Cervical: No cervical adenopathy.  Skin:    General: Skin is warm and dry.     Findings: No erythema or rash.  Neurological:     Mental Status: She is alert and oriented to person, place, and time.     Cranial Nerves: No cranial nerve deficit.     Motor: No abnormal muscle tone.     Coordination: Coordination normal.  Psychiatric:        Mood and Affect: Affect normal.   LABORATORY DATA:  I have reviewed the data as listed: no recent labs.  Lab Results  Component Value Date   WBC 6.0 06/18/2021   HGB 13.3 06/18/2021   HCT 40.1 06/18/2021   MCV 93.5 06/18/2021   PLT 305 06/18/2021   Recent Labs    08/25/20 0947 05/18/21 1433 06/18/21 1346  NA 139 139 137  K 4.0 4.3 4.2  CL 107 106 103  CO2 _0 GLUCOSE 113* 95 114*  BUN _1 CREATININE 0.72 0.65 0.75  CALCIUM 8.9 9.2 8.8*  GFRNONAA >60  --  >60  PROT 6.7 6.1 6.8  ALBUMIN 3.5  --  3.8  AST _2 ALT _3 ALKPHOS 72  --  115  BILITOT 0.4 0.4 0.5      RADIOGRAPHIC STUDIES: I have personally reviewed the radiological images as listed and agreed with  the findings in the report. MM 3D SCREEN BREAST BILATERAL  Result Date: 05/23/2021 CLINICAL DATA:  Screening. EXAM: DIGITAL SCREENING BILATERAL MAMMOGRAM WITH TOMOSYNTHESIS AND CAD TECHNIQUE: Bilateral screening digital craniocaudal and mediolateral oblique mammograms were obtained. Bilateral screening digital breast tomosynthesis was performed. The images were evaluated with computer-aided detection. COMPARISON:  Previous exam(s). ACR Breast Density Category b: There are scattered areas of fibroglandular density. FINDINGS: There are no findings suspicious for malignancy. IMPRESSION: No mammographic evidence of malignancy. A result letter of this screening mammogram will be mailed directly to the patient. RECOMMENDATION: Screening mammogram in one year. (Code:SM-B-01Y) BI-RADS CATEGORY  1: Negative. Electronically Signed   By: Audie Pinto M.D.   On: 05/23/2021 13:13    ASSESSMENT & PLAN:  60 yo premenopausal female with Stage 1A breast cancer, high risk mammaprint index currently on adjuvant chemotherapy with TC. Cancer Staging Malignant neoplasm of upper-inner quadrant of left breast in female, estrogen receptor positive ([facility]) Staging form: Breast, AJCC 8th Edition - Clinical stage from 05/22/2017: Stage IA (cT1, cN0, cM0, G1, ER+,  PR+, HER2-) - Signed by Earlie Server, MD on 05/22/2017 - Pathologic stage from 07/09/2017: Stage IA (pT1b, pN0, cM0, G2, ER+, PR+, HER2-) - Signed by Earlie Server, MD on 07/30/2017  1. Malignant neoplasm of upper-inner quadrant of left breast in female, estrogen receptor positive ([place])   2. Osteopenia, unspecified location     # Stage IA, left Breast cancer.  Last time [facility], estradiol showed that she is in perimenopausal state.  Recommend her to continue ovarian suppression and Arimidex. Patient prefers to have her [place] and estradiol checked again.  If still perimenopausal state, resume Zoledex.   # Body aches due to Zoledex Consider Tylenol #3 PRN for 2-3 days after  injections.    # Osteopenia, recommend calcium and vitamin D supplementation. Repeat DEXA Recommend patient to obtain dental clearance, consider bisphosphonate.    Return of visit:  to be determined.   Earlie Server, MD, PhD Hematology Oncology  06/18/2021

## 2021-06-18 NOTE — Progress Notes (Signed)
Pt has no concerns at this time. 

## 2021-06-19 ENCOUNTER — Other Ambulatory Visit: Payer: Self-pay | Admitting: Oncology

## 2021-06-19 ENCOUNTER — Telehealth: Payer: Self-pay

## 2021-06-19 ENCOUNTER — Ambulatory Visit
Admission: RE | Admit: 2021-06-19 | Discharge: 2021-06-19 | Disposition: A | Discharge status: Home / Self Care | Payer: Medicaid Other | Source: Ambulatory Visit | Attending: Oncology | Admitting: Oncology

## 2021-06-19 DIAGNOSIS — Z170 Estrogen receptor positive status [ER+]: Secondary | ICD-10-CM

## 2021-06-19 DIAGNOSIS — C50212 Malignant neoplasm of upper-inner quadrant of left female breast: Secondary | ICD-10-CM

## 2021-06-19 DIAGNOSIS — M8580 Other specified disorders of bone density and structure, unspecified site: Secondary | ICD-10-CM | POA: Insufficient documentation

## 2021-06-19 DIAGNOSIS — M85852 Other specified disorders of bone density and structure, left thigh: Secondary | ICD-10-CM | POA: Diagnosis not present

## 2021-06-19 LAB — ESTRADIOL: Estradiol: 16.8 pg/mL

## 2021-06-19 LAB — FOLLICLE STIMULATING HORMONE: FSH: 40.7 m[IU]/mL

## 2021-06-19 MED ORDER — ANASTROZOLE 1 MG PO TABS: 1.0000 mg | ORAL_TABLET | Freq: Every day | ORAL | 1 refills | Status: DC

## 2021-06-19 NOTE — Telephone Encounter (Signed)
-----   Message from Earlie Server, MD sent at 06/19/2021  8:25 AM EST ----- My chart message sent.  Please schedule her to do lab cbc cmp estradiol, FSH [ not FSH/LH, just FSH]  in 4 months, see me a few days after labs. Thanks.

## 2021-06-19 NOTE — Telephone Encounter (Signed)
Pt informed that she is in menopausal range and that Dr. Tasia Catchings will hold off Zoladex injection for now.  Also to conintue anastrazole and that refill was sent. Pt voiced understanding.   Please schedule her for labs in 4 months (cbc,cmp, estradiol, FSH (not FSH/LH, just [facility]) and MD a few days after labs. Please inform pt of appts.

## 2021-06-25 DIAGNOSIS — Z1211 Encounter for screening for malignant neoplasm of colon: Secondary | ICD-10-CM | POA: Diagnosis not present

## 2021-06-25 LAB — COLOGUARD: Cologuard: NEGATIVE

## 2021-06-30 ENCOUNTER — Encounter: Payer: Self-pay | Admitting: Oncology

## 2021-06-30 LAB — COLOGUARD: COLOGUARD: NEGATIVE

## 2021-07-02 NOTE — Telephone Encounter (Signed)
Sheena Martinez, will look at her bone density results and let me know if there is anything patient should know/ be concerned of please?   Thanks

## 2021-07-03 DIAGNOSIS — H25811 Combined forms of age-related cataract, right eye: Secondary | ICD-10-CM | POA: Diagnosis not present

## 2021-07-13 ENCOUNTER — Encounter: Payer: Self-pay | Admitting: Family Medicine

## 2021-07-19 ENCOUNTER — Ambulatory Visit: Payer: Medicaid Other | Admitting: Radiation Oncology

## 2021-07-23 ENCOUNTER — Ambulatory Visit: Payer: Medicaid Other | Admitting: Radiation Oncology

## 2021-07-23 DIAGNOSIS — H16002 Unspecified corneal ulcer, left eye: Secondary | ICD-10-CM | POA: Diagnosis not present

## 2021-07-23 DIAGNOSIS — M159 Polyosteoarthritis, unspecified: Secondary | ICD-10-CM | POA: Diagnosis not present

## 2021-08-12 DIAGNOSIS — A4902 Methicillin resistant Staphylococcus aureus infection, unspecified site: Secondary | ICD-10-CM

## 2021-08-12 HISTORY — DX: Methicillin resistant Staphylococcus aureus infection, unspecified site: A49.02

## 2021-08-21 DIAGNOSIS — H5213 Myopia, bilateral: Secondary | ICD-10-CM | POA: Diagnosis not present

## 2021-08-24 ENCOUNTER — Ambulatory Visit
Admission: RE | Admit: 2021-08-24 | Discharge: 2021-08-24 | Disposition: A | Discharge status: Home / Self Care | Payer: Medicaid Other | Source: Ambulatory Visit | Attending: Radiation Oncology | Admitting: Radiation Oncology

## 2021-08-24 ENCOUNTER — Other Ambulatory Visit: Payer: Self-pay

## 2021-08-24 ENCOUNTER — Encounter: Payer: Self-pay | Admitting: Radiation Oncology

## 2021-08-24 VITALS — BP 139/73 | HR 69 | Temp 97.6°F | Wt 236.9 lb

## 2021-08-24 DIAGNOSIS — Z79811 Long term (current) use of aromatase inhibitors: Secondary | ICD-10-CM | POA: Diagnosis not present

## 2021-08-24 DIAGNOSIS — Z923 Personal history of irradiation: Secondary | ICD-10-CM | POA: Insufficient documentation

## 2021-08-24 DIAGNOSIS — C50212 Malignant neoplasm of upper-inner quadrant of left female breast: Secondary | ICD-10-CM | POA: Insufficient documentation

## 2021-08-24 DIAGNOSIS — Z170 Estrogen receptor positive status [ER+]: Secondary | ICD-10-CM | POA: Diagnosis not present

## 2021-08-24 NOTE — Progress Notes (Signed)
Radiation Oncology Follow up Note  Name: Sheena Martinez   Date:   08/24/2021 MRN:  [id number] DOB: 14-Dec-1960    This 61 y.o. female presents to the clinic today for over 4-year follow-up status post accelerated partial breast radiation to her left breast for stage I ER/PR positive invasive mammary carcinoma.  REFERRING PROVIDER: Delsa Grana, PA-C  HPI: Patient is a 61 year old female now out over 4 years having completed accelerated partial breast radiation to her left breast for stage I ER/PR positive invasive mammary carcinoma.  Seen today in routine follow-up she is doing well she specifically denies breast tenderness cough or bone pain she is having problems with her eyes.  She is currently on anastrozole tolerating it well without side effect.  She had mammograms back in October which I have reviewed were BI-RADS 1 negative.  COMPLICATIONS OF TREATMENT: none  FOLLOW UP COMPLIANCE: keeps appointments   PHYSICAL EXAM:  BP 139/73    Pulse 69    Temp 97.6 F (36.4 C) (Tympanic)    Wt 236 lb 14.4 oz (107.5 kg)    BMI 46.27 kg/m  Lungs are clear to A&P cardiac examination essentially unremarkable with regular rate and rhythm. No dominant mass or nodularity is noted in either breast in 2 positions examined. Incision is well-healed. No axillary or supraclavicular adenopathy is appreciated. Cosmetic result is excellent.  Well-developed well-nourished patient in NAD. HEENT reveals PERLA, EOMI, discs not visualized.  Oral cavity is clear. No oral mucosal lesions are identified. Neck is clear without evidence of cervical or supraclavicular adenopathy. Lungs are clear to A&P. Cardiac examination is essentially unremarkable with regular rate and rhythm without murmur rub or thrill. Abdomen is benign with no organomegaly or masses noted. Motor sensory and DTR levels are equal and symmetric in the upper and lower extremities. Cranial nerves II through XII are grossly intact. Proprioception is intact. No  peripheral adenopathy or edema is identified. No motor or sensory levels are noted. Crude visual fields are within normal range.  RADIOLOGY RESULTS: Mammograms reviewed compatible with above-stated findings  PLAN: Present time patient continues to do well now out over 4 years with no evidence of breast disease.  And pleased with her overall progress I am going to discontinue follow-up care.  She continues follow-up care with surgery as well as medical oncology.  Patient knows to call at anytime with any concerns.  I would like to take this opportunity to thank you for allowing me to participate in the care of your patient.Noreene Filbert, MD

## 2021-09-11 DIAGNOSIS — M170 Bilateral primary osteoarthritis of knee: Secondary | ICD-10-CM | POA: Diagnosis not present

## 2021-09-12 ENCOUNTER — Telehealth: Payer: Self-pay | Admitting: Oncology

## 2021-09-12 NOTE — Telephone Encounter (Signed)
Spoke to patient and clarified scheduling questions. She will need labs prior to MD appt. Please move lab to 3/3 in the afternoon. Patient will see appt on Mychart

## 2021-09-12 NOTE — Telephone Encounter (Signed)
Attempted to call pt, sent MyChart message

## 2021-09-12 NOTE — Telephone Encounter (Signed)
Pt called with questions about her appt. Call back at [phone]

## 2021-09-12 NOTE — Telephone Encounter (Signed)
Pt called back to question who appts are for? She and husband see same MD. Call back at [phone]

## 2021-09-13 NOTE — Telephone Encounter (Signed)
Appts have been moved

## 2021-10-12 ENCOUNTER — Other Ambulatory Visit: Payer: Self-pay

## 2021-10-12 ENCOUNTER — Inpatient Hospital Stay: Payer: Medicaid Other | Attending: Oncology

## 2021-10-12 DIAGNOSIS — Z800 Family history of malignant neoplasm of digestive organs: Secondary | ICD-10-CM | POA: Diagnosis not present

## 2021-10-12 DIAGNOSIS — Z803 Family history of malignant neoplasm of breast: Secondary | ICD-10-CM | POA: Insufficient documentation

## 2021-10-12 DIAGNOSIS — M8580 Other specified disorders of bone density and structure, unspecified site: Secondary | ICD-10-CM | POA: Diagnosis not present

## 2021-10-12 DIAGNOSIS — Z833 Family history of diabetes mellitus: Secondary | ICD-10-CM | POA: Diagnosis not present

## 2021-10-12 DIAGNOSIS — Z79811 Long term (current) use of aromatase inhibitors: Secondary | ICD-10-CM | POA: Insufficient documentation

## 2021-10-12 DIAGNOSIS — C50212 Malignant neoplasm of upper-inner quadrant of left female breast: Secondary | ICD-10-CM | POA: Diagnosis not present

## 2021-10-12 DIAGNOSIS — Z808 Family history of malignant neoplasm of other organs or systems: Secondary | ICD-10-CM | POA: Insufficient documentation

## 2021-10-12 DIAGNOSIS — M3500 Sicca syndrome, unspecified: Secondary | ICD-10-CM | POA: Diagnosis not present

## 2021-10-12 DIAGNOSIS — Z87891 Personal history of nicotine dependence: Secondary | ICD-10-CM | POA: Insufficient documentation

## 2021-10-12 DIAGNOSIS — E282 Polycystic ovarian syndrome: Secondary | ICD-10-CM | POA: Diagnosis not present

## 2021-10-12 DIAGNOSIS — Z881 Allergy status to other antibiotic agents status: Secondary | ICD-10-CM | POA: Insufficient documentation

## 2021-10-12 DIAGNOSIS — F419 Anxiety disorder, unspecified: Secondary | ICD-10-CM | POA: Insufficient documentation

## 2021-10-12 DIAGNOSIS — Z8379 Family history of other diseases of the digestive system: Secondary | ICD-10-CM | POA: Insufficient documentation

## 2021-10-12 DIAGNOSIS — Z888 Allergy status to other drugs, medicaments and biological substances status: Secondary | ICD-10-CM | POA: Diagnosis not present

## 2021-10-12 DIAGNOSIS — Z79899 Other long term (current) drug therapy: Secondary | ICD-10-CM | POA: Insufficient documentation

## 2021-10-12 DIAGNOSIS — M2550 Pain in unspecified joint: Secondary | ICD-10-CM | POA: Diagnosis not present

## 2021-10-12 DIAGNOSIS — R232 Flushing: Secondary | ICD-10-CM | POA: Diagnosis not present

## 2021-10-12 DIAGNOSIS — Z832 Family history of diseases of the blood and blood-forming organs and certain disorders involving the immune mechanism: Secondary | ICD-10-CM | POA: Diagnosis not present

## 2021-10-12 DIAGNOSIS — Z170 Estrogen receptor positive status [ER+]: Secondary | ICD-10-CM

## 2021-10-12 DIAGNOSIS — Z560 Unemployment, unspecified: Secondary | ICD-10-CM | POA: Diagnosis not present

## 2021-10-12 DIAGNOSIS — Z79810 Long term (current) use of selective estrogen receptor modulators (SERMs): Secondary | ICD-10-CM | POA: Insufficient documentation

## 2021-10-12 DIAGNOSIS — Z806 Family history of leukemia: Secondary | ICD-10-CM | POA: Insufficient documentation

## 2021-10-12 DIAGNOSIS — Z923 Personal history of irradiation: Secondary | ICD-10-CM | POA: Diagnosis not present

## 2021-10-12 DIAGNOSIS — Z801 Family history of malignant neoplasm of trachea, bronchus and lung: Secondary | ICD-10-CM | POA: Diagnosis not present

## 2021-10-12 DIAGNOSIS — E0590 Thyrotoxicosis, unspecified without thyrotoxic crisis or storm: Secondary | ICD-10-CM | POA: Diagnosis not present

## 2021-10-12 DIAGNOSIS — Z8041 Family history of malignant neoplasm of ovary: Secondary | ICD-10-CM | POA: Insufficient documentation

## 2021-10-12 LAB — CBC WITH DIFFERENTIAL/PLATELET
Abs Immature Granulocytes: 0.01 10*3/uL (ref 0.00–0.07)
Basophils Absolute: 0 10*3/uL (ref 0.0–0.1)
Basophils Relative: 1 %
Eosinophils Absolute: 0.1 10*3/uL (ref 0.0–0.5)
Eosinophils Relative: 2 %
HCT: 40.7 % (ref 36.0–46.0)
Hemoglobin: 13.4 g/dL (ref 12.0–15.0)
Immature Granulocytes: 0 %
Lymphocytes Relative: 34 %
Lymphs Abs: 2.3 10*3/uL (ref 0.7–4.0)
MCH: 32.2 pg (ref 26.0–34.0)
MCHC: 32.9 g/dL (ref 30.0–36.0)
MCV: 97.8 fL (ref 80.0–100.0)
Monocytes Absolute: 0.7 10*3/uL (ref 0.1–1.0)
Monocytes Relative: 11 %
Neutro Abs: 3.5 10*3/uL (ref 1.7–7.7)
Neutrophils Relative %: 52 %
Platelets: 343 10*3/uL (ref 150–400)
RBC: 4.16 MIL/uL (ref 3.87–5.11)
RDW: 15 % (ref 11.5–15.5)
WBC: 6.6 10*3/uL (ref 4.0–10.5)
nRBC: 0 % (ref 0.0–0.2)

## 2021-10-12 LAB — COMPREHENSIVE METABOLIC PANEL
ALT: 25 U/L (ref 0–44)
AST: 21 U/L (ref 15–41)
Albumin: 3.8 g/dL (ref 3.5–5.0)
Alkaline Phosphatase: 111 U/L (ref 38–126)
Anion gap: 4 — ABNORMAL LOW (ref 5–15)
BUN: 20 mg/dL (ref 6–20)
CO2: 26 mmol/L (ref 22–32)
Calcium: 8.9 mg/dL (ref 8.9–10.3)
Chloride: 106 mmol/L (ref 98–111)
Creatinine, Ser: 0.7 mg/dL (ref 0.44–1.00)
GFR, Estimated: >60 mL/min (ref >60)
Glucose, Bld: 75 mg/dL (ref 70–99)
Potassium: 4.3 mmol/L (ref 3.5–5.1)
Sodium: 136 mmol/L (ref 135–145)
Total Bilirubin: 0.3 mg/dL (ref 0.3–1.2)
Total Protein: 7.3 g/dL (ref 6.5–8.1)

## 2021-10-13 LAB — ESTRADIOL: Estradiol: 34.9 pg/mL

## 2021-10-13 LAB — FOLLICLE STIMULATING HORMONE: FSH: 42.9 m[IU]/mL

## 2021-10-16 ENCOUNTER — Inpatient Hospital Stay (HOSPITAL_BASED_OUTPATIENT_CLINIC_OR_DEPARTMENT_OTHER): Payer: Medicaid Other | Admitting: Oncology

## 2021-10-16 ENCOUNTER — Other Ambulatory Visit: Payer: Self-pay

## 2021-10-16 ENCOUNTER — Encounter: Payer: Self-pay | Admitting: Oncology

## 2021-10-16 ENCOUNTER — Other Ambulatory Visit: Payer: Medicaid Other

## 2021-10-16 VITALS — BP 125/64 | HR 68 | Temp 97.8°F | Resp 18 | Ht 60.0 in | Wt 230.0 lb

## 2021-10-16 DIAGNOSIS — F419 Anxiety disorder, unspecified: Secondary | ICD-10-CM | POA: Diagnosis not present

## 2021-10-16 DIAGNOSIS — M2550 Pain in unspecified joint: Secondary | ICD-10-CM | POA: Diagnosis not present

## 2021-10-16 DIAGNOSIS — Z803 Family history of malignant neoplasm of breast: Secondary | ICD-10-CM | POA: Diagnosis not present

## 2021-10-16 DIAGNOSIS — Z560 Unemployment, unspecified: Secondary | ICD-10-CM | POA: Diagnosis not present

## 2021-10-16 DIAGNOSIS — Z79810 Long term (current) use of selective estrogen receptor modulators (SERMs): Secondary | ICD-10-CM | POA: Diagnosis not present

## 2021-10-16 DIAGNOSIS — Z801 Family history of malignant neoplasm of trachea, bronchus and lung: Secondary | ICD-10-CM | POA: Diagnosis not present

## 2021-10-16 DIAGNOSIS — Z833 Family history of diabetes mellitus: Secondary | ICD-10-CM | POA: Diagnosis not present

## 2021-10-16 DIAGNOSIS — C50212 Malignant neoplasm of upper-inner quadrant of left female breast: Secondary | ICD-10-CM

## 2021-10-16 DIAGNOSIS — Z8379 Family history of other diseases of the digestive system: Secondary | ICD-10-CM | POA: Diagnosis not present

## 2021-10-16 DIAGNOSIS — E282 Polycystic ovarian syndrome: Secondary | ICD-10-CM | POA: Diagnosis not present

## 2021-10-16 DIAGNOSIS — M8580 Other specified disorders of bone density and structure, unspecified site: Secondary | ICD-10-CM | POA: Diagnosis not present

## 2021-10-16 DIAGNOSIS — Z79811 Long term (current) use of aromatase inhibitors: Secondary | ICD-10-CM

## 2021-10-16 DIAGNOSIS — Z881 Allergy status to other antibiotic agents status: Secondary | ICD-10-CM | POA: Diagnosis not present

## 2021-10-16 DIAGNOSIS — Z800 Family history of malignant neoplasm of digestive organs: Secondary | ICD-10-CM | POA: Diagnosis not present

## 2021-10-16 DIAGNOSIS — Z888 Allergy status to other drugs, medicaments and biological substances status: Secondary | ICD-10-CM | POA: Diagnosis not present

## 2021-10-16 DIAGNOSIS — Z87891 Personal history of nicotine dependence: Secondary | ICD-10-CM | POA: Diagnosis not present

## 2021-10-16 DIAGNOSIS — Z808 Family history of malignant neoplasm of other organs or systems: Secondary | ICD-10-CM | POA: Diagnosis not present

## 2021-10-16 DIAGNOSIS — Z923 Personal history of irradiation: Secondary | ICD-10-CM | POA: Diagnosis not present

## 2021-10-16 DIAGNOSIS — E0590 Thyrotoxicosis, unspecified without thyrotoxic crisis or storm: Secondary | ICD-10-CM | POA: Diagnosis not present

## 2021-10-16 DIAGNOSIS — Z170 Estrogen receptor positive status [ER+]: Secondary | ICD-10-CM | POA: Diagnosis not present

## 2021-10-16 DIAGNOSIS — R232 Flushing: Secondary | ICD-10-CM | POA: Diagnosis not present

## 2021-10-16 DIAGNOSIS — Z832 Family history of diseases of the blood and blood-forming organs and certain disorders involving the immune mechanism: Secondary | ICD-10-CM | POA: Diagnosis not present

## 2021-10-16 DIAGNOSIS — Z79899 Other long term (current) drug therapy: Secondary | ICD-10-CM | POA: Diagnosis not present

## 2021-10-16 DIAGNOSIS — M3500 Sicca syndrome, unspecified: Secondary | ICD-10-CM | POA: Diagnosis not present

## 2021-10-16 MED ORDER — ANASTROZOLE 1 MG PO TABS
1.0000 mg | ORAL_TABLET | Freq: Every day | ORAL | 1 refills | Status: DC
Start: 2021-10-16 — End: 2022-04-18

## 2021-10-16 NOTE — Progress Notes (Signed)
Hematology/Oncology Follow up note  [phone] [phone]   Patient Care Team: Delsa Grana, PA-C as PCP - General (Family Medicine) Rico Junker, RN as Registered Nurse Theodore Demark, RN as Registered Nurse Byrnett, Forest Gleason, MD (General Surgery) Noreene Filbert, MD as Referring Physician (Radiation Oncology) Earlie Server, MD as Consulting Physician (Oncology) Shon Hough, MD as Referring Physician (Endocrinology)  REFERRING PROVIDER: Delsa Grana, PA-C REASON FOR VISIT Follow up for breast cancer  HISTORY OF PRESENTING ILLNESS:  Sheena Martinez is a  61 y.o.  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 Margin is negative, no LVI. Mammoprintr revealed 10 year recurrence rate at 29% high risk group. Patient has completed MammoSite RT. Denies any hormonal replacement therapy.  Adjuvant chemotherapy with TC, finished 3 cycles.  She is perimenopausal.   # His Sjogren disease for which she has had tear duct surgery. She also reports history of PCOS with elevated testosterone level.   Genetic testing:  Genetic testing result vis [facility] showed no pathogenic sequence appearance or deletions /duplications.  # she developed corneal ulcer for which she has to undergo emergency ophthalmology surgery.  This is considered to be related to Sjogren's syndrome.  Patient has been started on prednisone by ophthalmologist.  Her anxiety has not been well controlled lately due to her ophthalmology problems.  Patient has been on Paxil for a long time and recently switched to Lexapro as Paxil interfere with efficacy of tamoxifen.  She feels her anxiety is not well controlled.  Current Treatment Adjuvant TC x3. Patient declined cycle 4.  Started on Tamoxifen in March 2019. Declined option of ovarian suppression with aromatase inhibitor as patient is concerned about having new side effects.   INTERVAL HISTORY 61  y.o. female  presented for follow up for breast cancer. Patient is taking Tamoxifen $RemoveBeforeDE'20mg'jKLYtufSzqwOYdR$  daily. She has hot flash.  Follows up with Gyn and was recommended to try  black cohosh  She has not started yet and want to check with me if ok to be used with Tamoxifen.   She has anxiety and follows up with [facility] psychiatrist and was previously on Zoloft and Ativan. Recently Zoloft was changed to Effexor.  Hyperthyroidism, on methimazole.  Follows up with endocrinology. Cornea ulcer, s/p cornea transplant. Currently on steroid eye drop Denies any fever, chills, chest pain, shortness of breath, abdominal pain, back pain, lower extremity swelling. Denies any concern of her breast.  She has had bilateral diagnostic mammogram done on 05/06/2019  which did not show malignant findings. Bone density 03/15/2019 showed osteopenia.    INTERVAL HISTORY Sheena Martinez is a 61 y.o. female who has above history reviewed by me today presents for follow up visit for management of breast cancer.  She is back on Paxil.  She is on Zoledex + Arimidex. She did not tolerate Zoledex due to body aches.  She has no new complains.  Review of Systems  Constitutional:  Negative for appetite change, chills, fatigue and fever.  HENT:   Negative for hearing loss and voice change.   Eyes:  Negative for eye problems.  Respiratory:  Negative for chest tightness and cough.   Cardiovascular:  Negative for chest pain.  Gastrointestinal:  Negative for abdominal distention, abdominal pain and blood in stool.  Endocrine: Positive for hot flashes.  Genitourinary:  Negative for difficulty urinating and frequency.   Musculoskeletal:  Positive for arthralgias.  Skin:  Negative for itching and  rash.  Neurological:  Negative for extremity weakness.  Hematological:  Negative for adenopathy.  Psychiatric/Behavioral:  Negative for confusion. The patient is nervous/anxious.    MEDICAL HISTORY:  Past Medical History:  Diagnosis Date   Anxiety     Arthritis    Breast cancer (HCC) 05/12/2017   INVASIVE MAMMARY CARCINOMA ER/PR positive LEFT BREAST UPPER inner  QUAD   Cataract 01/11/2020   left   Corneal perforation of left eye 10/09/2017   Overview:  Added automatically from request for surgery [id number]   GERD (gastroesophageal reflux disease)    OCC   Goiter    Hematuria 10/21/2018   Hyperthyroidism    Personal history of radiation therapy 2018   LEFT lumpectomy   Sjogren's syndrome (HCC)    Uses continuous positive airway pressure (CPAP) ventilation at home 04/30/2019    SURGICAL HISTORY: Past Surgical History:  Procedure Laterality Date   BREAST BIOPSY Left 05/12/2017   [place] bx/ INVASIVE MAMMARY CARCINOMA   BREAST LUMPECTOMY Left 05/2017   invasive mammary carcinoma, neg margins   BREAST LUMPECTOMY WITH SENTINEL LYMPH NODE BIOPSY Left 06/10/2017   Procedure: BREAST LUMPECTOMY WITH SENTINEL LYMPH NODE BX AND NEEDLE LOCALIZATION;  Surgeon: Earline Mayotte, MD;  Location: [facility] ORS;  Service: General;  Laterality: Left;   BREAST MAMMOSITE Left 06/24/2017   Procedure: MAMMOSITE BREAST;  Surgeon: Earline Mayotte, MD;  Location: [facility] ORS;  Service: General;  Laterality: Left;   CESAREAN SECTION  1995   CORNEAL TRANSPLANT  11/2017   [facility]   COSMETIC SURGERY     CYST EXCISION  2018   pilar cyst/ Dr Orma Flaming   EYE SURGERY     PORTACATH PLACEMENT Right 07/08/2017   Procedure: INSERTION PORT-A-CATH;  Surgeon: Earline Mayotte, MD;  Location: [facility] ORS;  Service: General;  Laterality: Right;   TONSILLECTOMY      SOCIAL HISTORY: Social History   Socioeconomic History   Marital status: Married    Spouse name: Sherrine Maples   Number of children: 3   Years of education: Not on file   Highest education level: GED or equivalent  Occupational History   Occupation: unemployed  Tobacco Use   Smoking status: Former    Years: 7.00    Types: Cigarettes    Quit date: 08/13/1979    Years since quitting: 42.2   Smokeless tobacco: Never    Tobacco comments:    [age]  Vaping Use   Vaping Use: Never used  Substance and Sexual Activity   Alcohol use: No   Drug use: No   Sexual activity: Yes    Partners: Male  Other Topics Concern   Not on file  Social History Narrative   Not on file   Social Determinants of Health   Financial Resource Strain: Not on file  Food Insecurity: Not on file  Transportation Needs: Not on file  Physical Activity: Not on file  Stress: Not on file  Social Connections: Not on file  Intimate Partner Violence: Not on file    FAMILY HISTORY: Family History  Problem Relation Age of Onset   Breast cancer Maternal Aunt 45       currently ~65   Diabetes Mother    Skin cancer Mother        currently 73; TAH/BSO (age?)   Anemia Mother    Liver disease Father        deceased / not much info about him / alcoholic   Lung cancer Paternal Aunt  smoker / deceased 28s   Stomach cancer Paternal Uncle 11       deceased / deceased 76s   Ovarian cancer Maternal Grandmother 92       deceased 74s   Stomach cancer Paternal Grandfather 53       deceased 26s   Cancer Maternal Uncle        unk. type; deceased 38s   Leukemia Cousin 34       deceased 70    ALLERGIES:  is allergic to rituximab, benadryl [diphenhydramine], cortisone, diphenhydramine hcl, clonazepam, and sulfamethoxazole-trimethoprim.  MEDICATIONS:  Current Outpatient Medications  Medication Sig Dispense Refill   acetaminophen (TYLENOL) 500 MG tablet Take 1,000 mg by mouth every 8 (eight) hours as needed for mild pain or headache.     cholecalciferol (VITAMIN D) 1000 units tablet Take 2,000 Units by mouth daily.     diclofenac (VOLTAREN) 50 MG EC tablet Take 1 tablet by mouth 2 (two) times daily with a meal.     diclofenac Sodium (VOLTAREN) 1 % GEL Apply 2 g topically 4 (four) times daily. 100 g 3   dorzolamide-timolol (COSOPT) 22.3-6.8 MG/ML ophthalmic solution Place 1 drop into the left eye 2 (two) times daily.      doxycycline (VIBRA-TABS) 100 MG tablet Take 100 mg by mouth daily.     folic acid (FOLVITE) 1 MG tablet Take 1 mg by mouth daily.     ketoconazole (NIZORAL) 2 % cream Apply 1 application topically daily.     ketoconazole (NIZORAL) 2 % shampoo Apply 1 application topically 2 (two) times a week. 120 mL 3   Light Mineral Oil-Mineral Oil 0.5-0.5 % EMUL Apply to eye.     methotrexate (RHEUMATREX) 2.5 MG tablet Take 15 mg by mouth once a week.     moxifloxacin (VIGAMOX) 0.5 % ophthalmic solution INSTILL 1 DROP INTO LEFT EYE 4 TIMES DAILY FOR 14 DAYS     PARoxetine (PAXIL) 20 MG tablet Take 1 tablet (20 mg total) by mouth daily. 90 tablet 1   prednisoLONE acetate (PRED FORTE) 1 % ophthalmic suspension      vitamin k 100 MCG tablet Take 100 mcg by mouth 2 (two) times a week.     ALPHAGAN P 0.1 % SOLN Place 1 drop into the left eye 3 (three) times daily. (Patient not taking: Reported on 10/16/2021)     anastrozole (ARIMIDEX) 1 MG tablet Take 1 tablet (1 mg total) by mouth daily. 90 tablet 1   Ca Carbonate-Mag Hydroxide (ROLAIDS PO) Take 1 tablet as needed by mouth (indigestion).  (Patient not taking: Reported on 08/24/2021)     mycophenolate (CELLCEPT) 250 MG capsule Take by mouth. (Patient not taking: Reported on 10/16/2021)     neomycin-polymyxin b-dexamethasone (MAXITROL) 3.5-10000-0.1 SUSP  (Patient not taking: Reported on 10/16/2021)     ofloxacin (OCUFLOX) 0.3 % ophthalmic solution Place 1 drop into the left eye 3 (three) times daily. (Patient not taking: Reported on 10/16/2021)     No current facility-administered medications for this visit.   Facility-Administered Medications Ordered in Other Visits  Medication Dose Route Frequency Provider Last Rate Last Admin   goserelin (ZOLADEX) injection 3.6 mg  3.6 mg Subcutaneous Once Earlie Server, MD          .  PHYSICAL EXAMINATION: ECOG PERFORMANCE STATUS: 1 - Symptomatic but completely ambulatory Vitals:   10/16/21 1002 10/16/21 1004  BP:  125/64  Pulse:   68  Resp: 18 18  Temp:  97.8 F (36.6  C)  SpO2:  97%   Physical Exam Constitutional:      General: She is not in acute distress.    Appearance: She is not diaphoretic.     Comments: Obese.   HENT:     Head: Normocephalic and atraumatic.     Nose: Nose normal.     Mouth/Throat:     Pharynx: No oropharyngeal exudate.  Eyes:     General: No scleral icterus.    Conjunctiva/sclera: Conjunctivae normal.     Pupils: Pupils are equal, round, and reactive to light.  Neck:     Thyroid: No thyromegaly.  Cardiovascular:     Rate and Rhythm: Normal rate and regular rhythm.     Heart sounds: Normal heart sounds. No murmur heard. Pulmonary:     Effort: Pulmonary effort is normal. No respiratory distress.     Breath sounds: Normal breath sounds. No wheezing or rales.  Chest:     Chest wall: No tenderness.  Abdominal:     General: Bowel sounds are normal. There is no distension.     Palpations: Abdomen is soft.     Tenderness: There is no abdominal tenderness.  Musculoskeletal:        General: Normal range of motion.     Cervical back: Normal range of motion and neck supple.  Lymphadenopathy:     Cervical: No cervical adenopathy.  Skin:    General: Skin is warm and dry.     Findings: No erythema or rash.  Neurological:     Mental Status: She is alert and oriented to person, place, and time.     Cranial Nerves: No cranial nerve deficit.     Motor: No abnormal muscle tone.     Coordination: Coordination normal.  Psychiatric:        Mood and Affect: Affect normal.  She declined breast examination.   LABORATORY DATA:  I have reviewed the data as listed: no recent labs.  Lab Results  Component Value Date   WBC 6.6 10/12/2021   HGB 13.4 10/12/2021   HCT 40.7 10/12/2021   MCV 97.8 10/12/2021   PLT 343 10/12/2021   Recent Labs    05/18/21 1433 06/18/21 1346 10/12/21 1427  NA 139 137 136  K 4.3 4.2 4.3  CL 106 103 106  CO2 $Re'27 28 26  'qui$ GLUCOSE 95 114* 75  BUN $Re'16 18 20   'xkv$ CREATININE 0.65 0.75 0.70  CALCIUM 9.2 8.8* 8.9  GFRNONAA  --  >60 >60  PROT 6.1 6.8 7.3  ALBUMIN  --  3.8 3.8  AST $Re'15 21 21  'lDh$ ALT $R'17 22 25  'ih$ ALKPHOS  --  115 111  BILITOT 0.4 0.5 0.3      RADIOGRAPHIC STUDIES: I have personally reviewed the radiological images as listed and agreed with the findings in the report. No results found.   ASSESSMENT & PLAN:  61 yo premenopausal female with Stage 1A breast cancer, high risk mammaprint index s/p adjuvant chemotherapy with TC.presents for follow up  Cancer Staging  Malignant neoplasm of upper-inner quadrant of left breast in female, estrogen receptor positive ([place]) Staging form: Breast, AJCC 8th Edition - Clinical stage from 05/22/2017: Stage IA (cT1, cN0, cM0, G1, ER+, PR+, HER2-) - Signed by Earlie Server, MD on 05/22/2017 - Pathologic stage from 07/09/2017: Stage IA (pT1b, pN0, cM0, G2, ER+, PR+, HER2-) - Signed by Earlie Server, MD on 07/30/2017  1. Malignant neoplasm of upper-inner quadrant of left breast in female, estrogen receptor positive ([place])  2. Osteopenia, unspecified location   3. Aromatase inhibitor use     # Stage IA, left Breast cancer.  She is 60, LMP was more than 2 years ago. FSH>40, postmenopausal.  Recommend her to continue Arimidex.  Discontinue Zoledex.  Continue annual mammogram. - next due 05/2023. Will obtain.   # Osteopenia, recommend calcium and vitamin D supplementation. 06/19/21 DEXA, osteopenia has improved- normalized,.    Return of visit:  6 months.   Earlie Server, MD, PhD Hematology Oncology  10/16/2021

## 2021-10-16 NOTE — Progress Notes (Signed)
Patient reports some diarrhea from her medication ?

## 2021-10-22 DIAGNOSIS — M159 Polyosteoarthritis, unspecified: Secondary | ICD-10-CM | POA: Diagnosis not present

## 2021-10-23 DIAGNOSIS — M170 Bilateral primary osteoarthritis of knee: Secondary | ICD-10-CM | POA: Diagnosis not present

## 2021-10-25 DIAGNOSIS — H16002 Unspecified corneal ulcer, left eye: Secondary | ICD-10-CM | POA: Diagnosis not present

## 2021-10-25 DIAGNOSIS — M79641 Pain in right hand: Secondary | ICD-10-CM | POA: Diagnosis not present

## 2021-10-25 DIAGNOSIS — M159 Polyosteoarthritis, unspecified: Secondary | ICD-10-CM | POA: Diagnosis not present

## 2021-11-13 ENCOUNTER — Other Ambulatory Visit: Payer: Self-pay | Admitting: Family Medicine

## 2021-11-13 DIAGNOSIS — R519 Headache, unspecified: Secondary | ICD-10-CM

## 2021-11-14 DIAGNOSIS — L219 Seborrheic dermatitis, unspecified: Secondary | ICD-10-CM | POA: Diagnosis not present

## 2021-11-14 DIAGNOSIS — D692 Other nonthrombocytopenic purpura: Secondary | ICD-10-CM | POA: Diagnosis not present

## 2021-11-14 DIAGNOSIS — L689 Hypertrichosis, unspecified: Secondary | ICD-10-CM | POA: Diagnosis not present

## 2021-11-14 DIAGNOSIS — L821 Other seborrheic keratosis: Secondary | ICD-10-CM | POA: Diagnosis not present

## 2021-11-14 DIAGNOSIS — L578 Other skin changes due to chronic exposure to nonionizing radiation: Secondary | ICD-10-CM | POA: Diagnosis not present

## 2021-11-20 DIAGNOSIS — S92511A Displaced fracture of proximal phalanx of right lesser toe(s), initial encounter for closed fracture: Secondary | ICD-10-CM | POA: Diagnosis not present

## 2021-11-27 DIAGNOSIS — S92511A Displaced fracture of proximal phalanx of right lesser toe(s), initial encounter for closed fracture: Secondary | ICD-10-CM | POA: Diagnosis not present

## 2021-12-25 DIAGNOSIS — S92511A Displaced fracture of proximal phalanx of right lesser toe(s), initial encounter for closed fracture: Secondary | ICD-10-CM | POA: Diagnosis not present

## 2022-01-21 ENCOUNTER — Other Ambulatory Visit: Payer: Self-pay | Admitting: Family Medicine

## 2022-01-21 DIAGNOSIS — R519 Headache, unspecified: Secondary | ICD-10-CM

## 2022-01-25 DIAGNOSIS — S92511A Displaced fracture of proximal phalanx of right lesser toe(s), initial encounter for closed fracture: Secondary | ICD-10-CM | POA: Diagnosis not present

## 2022-02-11 ENCOUNTER — Other Ambulatory Visit: Payer: Self-pay | Admitting: Family Medicine

## 2022-02-11 DIAGNOSIS — R519 Headache, unspecified: Secondary | ICD-10-CM

## 2022-02-19 ENCOUNTER — Other Ambulatory Visit: Payer: Self-pay | Admitting: Family Medicine

## 2022-02-19 DIAGNOSIS — F4323 Adjustment disorder with mixed anxiety and depressed mood: Secondary | ICD-10-CM

## 2022-02-19 DIAGNOSIS — S92511D Displaced fracture of proximal phalanx of right lesser toe(s), subsequent encounter for fracture with routine healing: Secondary | ICD-10-CM | POA: Diagnosis not present

## 2022-02-21 ENCOUNTER — Telehealth: Payer: Self-pay

## 2022-02-21 NOTE — Telephone Encounter (Signed)
Copied from [place]. Topic: General - Other >> Feb 20, 2022  4:06 PM Everette C wrote: Reason for CRM: The patient has called to request orders for their thyroid to be checked  Patient has been scheduled to see their PCP on 03/05/22 at 2 PM   Please contact the patient further if needed

## 2022-03-01 DIAGNOSIS — H16002 Unspecified corneal ulcer, left eye: Secondary | ICD-10-CM | POA: Diagnosis not present

## 2022-03-05 ENCOUNTER — Ambulatory Visit: Payer: Medicaid Other | Admitting: Family Medicine

## 2022-03-12 ENCOUNTER — Other Ambulatory Visit: Payer: Self-pay | Admitting: Nurse Practitioner

## 2022-03-12 DIAGNOSIS — R519 Headache, unspecified: Secondary | ICD-10-CM

## 2022-03-13 NOTE — Telephone Encounter (Signed)
Requested medication (s) are due for refill today: yes  Requested medication (s) are on the active medication list: yes  Last refill:  02/15/22  Future visit scheduled: no  Notes to clinic:  Unable to refill per protocol, cannot delegate.     Requested Prescriptions  Pending Prescriptions Disp Refills   ketoconazole (NIZORAL) 2 % shampoo [Pharmacy Med Name: Ketoconazole 2 % External Shampoo] 120 mL 0    Sig: APPLY TOPICALLY TWICE WEEKLY     Not Delegated - Over the Counter: OTC 2 Failed - 03/12/2022  9:17 AM      Failed - This refill cannot be delegated      Passed - Valid encounter within last 12 months    Recent Outpatient Visits           10 months ago Hyperlipidemia, unspecified hyperlipidemia type   [facility] Delsa Grana, PA-C   1 year ago Hyperlipidemia, unspecified hyperlipidemia type   [place], [place]   1 year ago Hyperlipidemia, unspecified hyperlipidemia type   [facility] Delsa Grana, PA-C   1 year ago Suspected COVID-19 virus infection   [facility] Delsa Grana, PA-C   2 years ago Class 2 severe obesity with serious comorbidity and body mass index (BMI) of 36.0 to 36.9 in adult, unspecified obesity type [facility])   [facility] Delsa Grana, [place]

## 2022-04-04 ENCOUNTER — Other Ambulatory Visit: Payer: Self-pay | Admitting: Family Medicine

## 2022-04-04 DIAGNOSIS — Z1231 Encounter for screening mammogram for malignant neoplasm of breast: Secondary | ICD-10-CM

## 2022-04-18 ENCOUNTER — Other Ambulatory Visit: Payer: Self-pay

## 2022-04-18 ENCOUNTER — Inpatient Hospital Stay (HOSPITAL_BASED_OUTPATIENT_CLINIC_OR_DEPARTMENT_OTHER): Payer: Medicaid Other | Admitting: Oncology

## 2022-04-18 ENCOUNTER — Encounter: Payer: Self-pay | Admitting: Oncology

## 2022-04-18 ENCOUNTER — Inpatient Hospital Stay: Payer: Medicaid Other | Attending: Oncology

## 2022-04-18 VITALS — BP 121/67 | HR 64 | Temp 97.6°F | Resp 18 | Wt 205.7 lb

## 2022-04-18 DIAGNOSIS — Z79811 Long term (current) use of aromatase inhibitors: Secondary | ICD-10-CM

## 2022-04-18 DIAGNOSIS — Z923 Personal history of irradiation: Secondary | ICD-10-CM | POA: Diagnosis not present

## 2022-04-18 DIAGNOSIS — Z8041 Family history of malignant neoplasm of ovary: Secondary | ICD-10-CM | POA: Insufficient documentation

## 2022-04-18 DIAGNOSIS — E282 Polycystic ovarian syndrome: Secondary | ICD-10-CM | POA: Diagnosis not present

## 2022-04-18 DIAGNOSIS — C50212 Malignant neoplasm of upper-inner quadrant of left female breast: Secondary | ICD-10-CM | POA: Insufficient documentation

## 2022-04-18 DIAGNOSIS — Z8379 Family history of other diseases of the digestive system: Secondary | ICD-10-CM | POA: Insufficient documentation

## 2022-04-18 DIAGNOSIS — Z170 Estrogen receptor positive status [ER+]: Secondary | ICD-10-CM | POA: Insufficient documentation

## 2022-04-18 DIAGNOSIS — Z806 Family history of leukemia: Secondary | ICD-10-CM | POA: Diagnosis not present

## 2022-04-18 DIAGNOSIS — Z808 Family history of malignant neoplasm of other organs or systems: Secondary | ICD-10-CM | POA: Diagnosis not present

## 2022-04-18 DIAGNOSIS — Z881 Allergy status to other antibiotic agents status: Secondary | ICD-10-CM | POA: Diagnosis not present

## 2022-04-18 DIAGNOSIS — Z801 Family history of malignant neoplasm of trachea, bronchus and lung: Secondary | ICD-10-CM | POA: Insufficient documentation

## 2022-04-18 DIAGNOSIS — Z79810 Long term (current) use of selective estrogen receptor modulators (SERMs): Secondary | ICD-10-CM | POA: Insufficient documentation

## 2022-04-18 DIAGNOSIS — Z888 Allergy status to other drugs, medicaments and biological substances status: Secondary | ICD-10-CM | POA: Diagnosis not present

## 2022-04-18 DIAGNOSIS — Z800 Family history of malignant neoplasm of digestive organs: Secondary | ICD-10-CM | POA: Diagnosis not present

## 2022-04-18 DIAGNOSIS — Z832 Family history of diseases of the blood and blood-forming organs and certain disorders involving the immune mechanism: Secondary | ICD-10-CM | POA: Diagnosis not present

## 2022-04-18 DIAGNOSIS — R232 Flushing: Secondary | ICD-10-CM | POA: Insufficient documentation

## 2022-04-18 DIAGNOSIS — Z87891 Personal history of nicotine dependence: Secondary | ICD-10-CM | POA: Diagnosis not present

## 2022-04-18 DIAGNOSIS — Z79899 Other long term (current) drug therapy: Secondary | ICD-10-CM | POA: Diagnosis not present

## 2022-04-18 DIAGNOSIS — F419 Anxiety disorder, unspecified: Secondary | ICD-10-CM | POA: Insufficient documentation

## 2022-04-18 DIAGNOSIS — M3500 Sicca syndrome, unspecified: Secondary | ICD-10-CM | POA: Insufficient documentation

## 2022-04-18 DIAGNOSIS — Z833 Family history of diabetes mellitus: Secondary | ICD-10-CM | POA: Insufficient documentation

## 2022-04-18 DIAGNOSIS — Z803 Family history of malignant neoplasm of breast: Secondary | ICD-10-CM | POA: Diagnosis not present

## 2022-04-18 DIAGNOSIS — M8580 Other specified disorders of bone density and structure, unspecified site: Secondary | ICD-10-CM | POA: Diagnosis not present

## 2022-04-18 DIAGNOSIS — M2550 Pain in unspecified joint: Secondary | ICD-10-CM | POA: Insufficient documentation

## 2022-04-18 LAB — CBC WITH DIFFERENTIAL/PLATELET
Abs Immature Granulocytes: 0.01 10*3/uL (ref 0.00–0.07)
Basophils Absolute: 0 10*3/uL (ref 0.0–0.1)
Basophils Relative: 0 %
Eosinophils Absolute: 0.1 10*3/uL (ref 0.0–0.5)
Eosinophils Relative: 1 %
HCT: 41.1 % (ref 36.0–46.0)
Hemoglobin: 13.8 g/dL (ref 12.0–15.0)
Immature Granulocytes: 0 %
Lymphocytes Relative: 40 %
Lymphs Abs: 2.3 10*3/uL (ref 0.7–4.0)
MCH: 32.9 pg (ref 26.0–34.0)
MCHC: 33.6 g/dL (ref 30.0–36.0)
MCV: 97.9 fL (ref 80.0–100.0)
Monocytes Absolute: 0.5 10*3/uL (ref 0.1–1.0)
Monocytes Relative: 9 %
Neutro Abs: 2.8 10*3/uL (ref 1.7–7.7)
Neutrophils Relative %: 50 %
Platelets: 305 10*3/uL (ref 150–400)
RBC: 4.2 MIL/uL (ref 3.87–5.11)
RDW: 14.5 % (ref 11.5–15.5)
WBC: 5.7 10*3/uL (ref 4.0–10.5)
nRBC: 0 % (ref 0.0–0.2)

## 2022-04-18 LAB — T4, FREE: Free T4: 0.85 ng/dL (ref 0.61–1.12)

## 2022-04-18 LAB — COMPREHENSIVE METABOLIC PANEL
ALT: 27 U/L (ref 0–44)
AST: 23 U/L (ref 15–41)
Albumin: 3.9 g/dL (ref 3.5–5.0)
Alkaline Phosphatase: 111 U/L (ref 38–126)
Anion gap: 5 (ref 5–15)
BUN: 20 mg/dL (ref 8–23)
CO2: 28 mmol/L (ref 22–32)
Calcium: 9.2 mg/dL (ref 8.9–10.3)
Chloride: 108 mmol/L (ref 98–111)
Creatinine, Ser: 0.72 mg/dL (ref 0.44–1.00)
GFR, Estimated: >60 mL/min (ref >60)
Glucose, Bld: 96 mg/dL (ref 70–99)
Potassium: 4.4 mmol/L (ref 3.5–5.1)
Sodium: 141 mmol/L (ref 135–145)
Total Bilirubin: 0.5 mg/dL (ref 0.3–1.2)
Total Protein: 6.9 g/dL (ref 6.5–8.1)

## 2022-04-18 LAB — TSH: TSH: 4.692 u[IU]/mL — ABNORMAL HIGH (ref 0.350–4.500)

## 2022-04-18 MED ORDER — ANASTROZOLE 1 MG PO TABS: 1.0000 mg | ORAL_TABLET | Freq: Every day | ORAL | 1 refills | Status: DC

## 2022-04-18 NOTE — Progress Notes (Signed)
Pt here for follow up. Pt has had 25 pound weight loss since last visit. Weight loss is intentional

## 2022-04-18 NOTE — Progress Notes (Signed)
Hematology/Oncology Follow up note  [phone] [phone]   Patient Care Team: Delsa Grana, PA-C as PCP - General (Family Medicine) Rico Junker, RN as Registered Nurse Theodore Demark, RN (Inactive) as Registered Nurse Byrnett, Forest Gleason, MD (General Surgery) Noreene Filbert, MD as Referring Physician (Radiation Oncology) Earlie Server, MD as Consulting Physician (Oncology) Shon Hough, MD as Referring Physician (Endocrinology)  REFERRING PROVIDER: Delsa Grana, PA-C REASON FOR VISIT Follow up for breast cancer  HISTORY OF PRESENTING ILLNESS:  Sheena Martinez is a  61 y.o.  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 Margin is negative, no LVI. Mammoprintr revealed 10 year recurrence rate at 29% high risk group. Patient has completed MammoSite RT. Denies any hormonal replacement therapy.  Adjuvant chemotherapy with TC, finished 3 cycles.  She is perimenopausal.   # His Sjogren disease for which she has had tear duct surgery. She also reports history of PCOS with elevated testosterone level.   Genetic testing:  Genetic testing result vis [facility] showed no pathogenic sequence appearance or deletions /duplications.  # she developed corneal ulcer for which she has to undergo emergency ophthalmology surgery.  This is considered to be related to Sjogren's syndrome.  Patient has been started on prednisone by ophthalmologist.  Her anxiety has not been well controlled lately due to her ophthalmology problems.  Patient has been on Paxil for a long time and recently switched to Lexapro as Paxil interfere with efficacy of tamoxifen.  She feels her anxiety is not well controlled.  Treatment Adjuvant TC x3. Patient declined cycle 4.  Started on Tamoxifen in March 2019. Declined option of ovarian suppression with aromatase inhibitor as patient is concerned about having new side effects.    She has anxiety  and follows up with [facility] psychiatrist and was previously on Zoloft and Ativan.  Zoloft was switched back to Paxil after tamoxifen was discontinued.  Hyperthyroidism, on methimazole.  Follows up with endocrinology. Cornea ulcer, s/p cornea transplant. Currently on steroid eye drop Denies any fever, chills, chest pain, shortness of breath, abdominal pain, back pain, lower extremity swelling. Denies any concern of her breast.  She has had bilateral diagnostic mammogram done on 05/06/2019  which did not show malignant findings. Bone density 03/15/2019 showed osteopenia.   #Adjuvant tamoxifen, switched to Zoladex plus Arimidex, Zoladex was discontinued after confirming postmenopausal state.    INTERVAL HISTORY Sheena Martinez is a 61 y.o. female who has above history reviewed by me today presents for follow up visit for management of breast cancer.  Patient takes Arimidex daily.  Overall tolerates well.  No new complaints.  She has no breast concerns today. During interval, patient has developed MRSA left infection and has been on vancomycin eyedrops.  She follows up mostly with her ophthalmologist.  She is also on methotrexate for autoimmune diseases.  Follows up with rheumatology. + hair loss   Review of Systems  Constitutional:  Negative for appetite change, chills, fatigue and fever.  HENT:   Negative for hearing loss and voice change.   Eyes:  Negative for eye problems.  Respiratory:  Negative for chest tightness and cough.   Cardiovascular:  Negative for chest pain.  Gastrointestinal:  Negative for abdominal distention, abdominal pain and blood in stool.  Endocrine: Positive for hot flashes.  Genitourinary:  Negative for difficulty urinating and frequency.   Musculoskeletal:  Positive for arthralgias.  Skin:  Negative for itching and rash.  Neurological:  Negative for extremity weakness.  Hematological:  Negative for adenopathy.  Psychiatric/Behavioral:  Negative for confusion.      MEDICAL HISTORY:  Past Medical History:  Diagnosis Date   Anxiety    Arthritis    Breast cancer ([place]) 05/12/2017   INVASIVE MAMMARY CARCINOMA ER/PR positive LEFT BREAST UPPER inner  QUAD   Cataract 01/11/2020   left   Corneal perforation of left eye 10/09/2017   Overview:  Added automatically from request for surgery [id number]   GERD (gastroesophageal reflux disease)    OCC   Goiter    Hematuria 10/21/2018   Hyperthyroidism    Personal history of radiation therapy 2018   LEFT lumpectomy   Sjogren's syndrome ([place])    Uses continuous positive airway pressure (CPAP) ventilation at home 04/30/2019    SURGICAL HISTORY: Past Surgical History:  Procedure Laterality Date   BREAST BIOPSY Left 05/12/2017   [place] bx/ INVASIVE MAMMARY CARCINOMA   BREAST LUMPECTOMY Left 05/2017   invasive mammary carcinoma, neg margins   BREAST LUMPECTOMY WITH SENTINEL LYMPH NODE BIOPSY Left 06/10/2017   Procedure: BREAST LUMPECTOMY WITH SENTINEL LYMPH NODE BX AND NEEDLE LOCALIZATION;  Surgeon: Robert Bellow, MD;  Location: [facility];  Service: General;  Laterality: Left;   BREAST MAMMOSITE Left 06/24/2017   Procedure: MAMMOSITE BREAST;  Surgeon: Robert Bellow, MD;  Location: [facility];  Service: General;  Laterality: Left;   [place]  11/2017   [facility]   COSMETIC SURGERY     CYST EXCISION  2018   pilar cyst/ Dr Will Bonnet   EYE SURGERY     PORTACATH PLACEMENT Right 07/08/2017   Procedure: INSERTION PORT-A-CATH;  Surgeon: Robert Bellow, MD;  Location: [facility];  Service: General;  Laterality: Right;   TONSILLECTOMY      SOCIAL HISTORY: Social History   Socioeconomic History   Marital status: Married    Spouse name: Eulas Post   Number of children: 3   Years of education: Not on file   Highest education level: GED or equivalent  Occupational History   Occupation: unemployed  Tobacco Use   Smoking status: Former    Years: 7.00    Types: Cigarettes    Quit  date: 08/13/1979    Years since quitting: 42.7   Smokeless tobacco: Never   Tobacco comments:    [age]  Vaping Use   Vaping Use: Never used  Substance and Sexual Activity   Alcohol use: No   Drug use: No   Sexual activity: Yes    Partners: Male  Other Topics Concern   Not on file  Social History Narrative   Not on file   Social Determinants of Health   Financial Resource Strain: Not on file  Food Insecurity: No Food Insecurity (06/19/2017)   Hunger Vital Sign    Worried About Running Out of Food in the Last Year: Never true    Ran Out of Food in the Last Year: Never true  Transportation Needs: No Transportation Needs (06/19/2017)   PRAPARE - Hydrologist (Medical): No    Lack of Transportation (Non-Medical): No  Physical Activity: Inactive (07/24/2018)   Exercise Vital Sign    Days of Exercise per Week: 0 days    Minutes of Exercise per Session: 0 min  Stress: Stress Concern Present (07/24/2018)   [place]    Feeling of Stress : Very much  Social  Connections: Socially Integrated (07/24/2018)   Social Connection and Isolation Panel [NHANES]    Frequency of Communication with Friends and Family: More than three times a week    Frequency of Social Gatherings with Friends and Family: More than three times a week    Attends Religious Services: More than 4 times per year    Active Member of [employer] or Organizations: Yes    Attends Music therapist: More than 4 times per year    Marital Status: Married  Human resources officer Violence: Not At Risk (07/24/2018)   Humiliation, Afraid, Rape, and Kick questionnaire    Fear of Current or Ex-Partner: No    Emotionally Abused: No    Physically Abused: No    Sexually Abused: No    FAMILY HISTORY: Family History  Problem Relation Age of Onset   Breast cancer Maternal Aunt 83       currently ~65   Diabetes Mother    Skin cancer  Mother        currently 51; TAH/BSO (age?)   Anemia Mother    Liver disease Father        deceased / not much info about him / alcoholic   Lung cancer Paternal Aunt        smoker / deceased 87s   Stomach cancer Paternal Uncle 29       deceased / deceased 48s   Ovarian cancer Maternal Grandmother 92       deceased 37s   Stomach cancer Paternal Grandfather 6       deceased 12s   Cancer Maternal Uncle        unk. type; deceased 54s   Leukemia Cousin 68       deceased 73    ALLERGIES:  is allergic to rituximab, benadryl [diphenhydramine], cortisone, diphenhydramine hcl, clonazepam, and sulfamethoxazole-trimethoprim.  MEDICATIONS:  Current Outpatient Medications  Medication Sig Dispense Refill   acetaminophen (TYLENOL) 500 MG tablet Take 1,000 mg by mouth every 8 (eight) hours as needed for mild pain or headache.     ALPHAGAN P 0.1 % SOLN Place 1 drop into the left eye 3 (three) times daily.     Ca Carbonate-Mag Hydroxide (ROLAIDS PO) Take 1 tablet by mouth as needed (indigestion).     cholecalciferol (VITAMIN D) 1000 units tablet Take 2,000 Units by mouth daily.     diclofenac (VOLTAREN) 50 MG EC tablet Take 1 tablet by mouth 2 (two) times daily with a meal.     diclofenac Sodium (VOLTAREN) 1 % GEL Apply 2 g topically 4 (four) times daily. 100 g 3   dorzolamide-timolol (COSOPT) 22.3-6.8 MG/ML ophthalmic solution Place 1 drop into the left eye 2 (two) times daily.     doxycycline (VIBRA-TABS) 100 MG tablet Take 100 mg by mouth daily.     folic acid (FOLVITE) 1 MG tablet Take 1 mg by mouth daily.     ketoconazole (NIZORAL) 2 % cream Apply 1 application topically daily.     ketoconazole (NIZORAL) 2 % shampoo APPLY TOPICALLY TWICE WEEKLY 120 mL 0   Light Mineral Oil-Mineral Oil 0.5-0.5 % EMUL Apply to eye.     methotrexate (RHEUMATREX) 2.5 MG tablet Take 15 mg by mouth once a week.     PARoxetine (PAXIL) 20 MG tablet Take 1 tablet by mouth once daily 90 tablet 1   prednisoLONE acetate  (PRED FORTE) 1 % ophthalmic suspension      vitamin k 100 MCG tablet Take 100 mcg by mouth  2 (two) times a week.     anastrozole (ARIMIDEX) 1 MG tablet Take 1 tablet (1 mg total) by mouth daily. 90 tablet 1   No current facility-administered medications for this visit.   Facility-Administered Medications Ordered in Other Visits  Medication Dose Route Frequency Provider Last Rate Last Admin   goserelin (ZOLADEX) injection 3.6 mg  3.6 mg Subcutaneous Once Earlie Server, MD          .  PHYSICAL EXAMINATION: ECOG PERFORMANCE STATUS: 1 - Symptomatic but completely ambulatory Vitals:   04/18/22 0951  BP: 121/67  Pulse: 64  Resp: 18  Temp: 97.6 F (36.4 C)   Physical Exam Constitutional:      General: She is not in acute distress.    Appearance: She is not diaphoretic.     Comments: Obese.   HENT:     Head: Normocephalic and atraumatic.     Nose: Nose normal.     Mouth/Throat:     Pharynx: No oropharyngeal exudate.  Eyes:     General: No scleral icterus.    Conjunctiva/sclera: Conjunctivae normal.     Pupils: Pupils are equal, round, and reactive to light.  Neck:     Thyroid: No thyromegaly.  Cardiovascular:     Rate and Rhythm: Normal rate and regular rhythm.     Heart sounds: Normal heart sounds. No murmur heard. Pulmonary:     Effort: Pulmonary effort is normal. No respiratory distress.     Breath sounds: Normal breath sounds. No wheezing or rales.  Chest:     Chest wall: No tenderness.  Abdominal:     General: Bowel sounds are normal. There is no distension.     Palpations: Abdomen is soft.     Tenderness: There is no abdominal tenderness.  Musculoskeletal:        General: Normal range of motion.     Cervical back: Normal range of motion and neck supple.  Lymphadenopathy:     Cervical: No cervical adenopathy.  Skin:    General: Skin is warm and dry.     Findings: No erythema or rash.  Neurological:     Mental Status: She is alert and oriented to person, place, and  time.     Cranial Nerves: No cranial nerve deficit.     Motor: No abnormal muscle tone.     Coordination: Coordination normal.  Psychiatric:        Mood and Affect: Affect normal.   Breast exam was performed in seated and lying down position. No palpable mass in bilateral breast.  No palpable axillary lymphadenopathy bilaterally.   LABORATORY DATA:  I have reviewed the data as listed: no recent labs.  Lab Results  Component Value Date   WBC 5.7 04/18/2022   HGB 13.8 04/18/2022   HCT 41.1 04/18/2022   MCV 97.9 04/18/2022   PLT 305 04/18/2022   Recent Labs    06/18/21 1346 10/12/21 1427 04/18/22 0931  NA 137 136 141  K 4.2 4.3 4.4  CL 103 106 108  CO2 _0 GLUCOSE 114* 75 96  BUN _1 CREATININE 0.75 0.70 0.72  CALCIUM 8.8* 8.9 9.2  GFRNONAA >60 >60 >60  PROT 6.8 7.3 6.9  ALBUMIN 3.8 3.8 3.9  AST _2 ALT _3 ALKPHOS 115 111 111  BILITOT 0.5 0.3 0.5      RADIOGRAPHIC STUDIES: I have personally reviewed the radiological images as listed and agreed with the  findings in the report. No results found.   ASSESSMENT & PLAN:  61 yo premenopausal female with Stage 1A breast cancer, high risk mammaprint index s/p adjuvant chemotherapy with TC.presents for follow up  Cancer Staging  Malignant neoplasm of upper-inner quadrant of left breast in female, estrogen receptor positive ([place]) Staging form: Breast, AJCC 8th Edition - Clinical stage from 05/22/2017: Stage IA (cT1, cN0, cM0, G1, ER+, PR+, HER2-) - Signed by Earlie Server, MD on 05/22/2017 - Pathologic stage from 07/09/2017: Stage IA (pT1b, pN0, cM0, G2, ER+, PR+, HER2-) - Signed by Earlie Server, MD on 07/30/2017  1. Malignant neoplasm of upper-inner quadrant of left breast in female, estrogen receptor positive ([place])   2. Osteopenia, unspecified location   3. Aromatase inhibitor use     # Stage IA, left Breast cancer.  She is 60, LMP was more than 2 years ago. FSH>40, postmenopausal.  Labs reviewed and  discussed with patient. Continue Arimidex. Recommend annual mammogram. -Scheduled in October 2023.  # Osteopenia, recommend patient to continue calcium and vitamin D supplementation.  06/19/21 DEXA, osteopenia has improved- normalized    Return of visit:  6 months.   Earlie Server, MD, PhD Hematology Oncology  04/18/2022

## 2022-04-22 ENCOUNTER — Other Ambulatory Visit: Payer: Self-pay | Admitting: General Surgery

## 2022-04-22 DIAGNOSIS — C50212 Malignant neoplasm of upper-inner quadrant of left female breast: Secondary | ICD-10-CM

## 2022-04-23 DIAGNOSIS — E038 Other specified hypothyroidism: Secondary | ICD-10-CM | POA: Diagnosis not present

## 2022-05-22 ENCOUNTER — Ambulatory Visit
Admission: RE | Admit: 2022-05-22 | Discharge: 2022-05-22 | Disposition: A | Discharge status: Home / Self Care | Payer: Medicaid Other | Source: Ambulatory Visit | Attending: Family Medicine | Admitting: Family Medicine

## 2022-05-22 DIAGNOSIS — Z1231 Encounter for screening mammogram for malignant neoplasm of breast: Secondary | ICD-10-CM | POA: Insufficient documentation

## 2022-05-30 DIAGNOSIS — Z1211 Encounter for screening for malignant neoplasm of colon: Secondary | ICD-10-CM | POA: Diagnosis not present

## 2022-05-30 DIAGNOSIS — Z853 Personal history of malignant neoplasm of breast: Secondary | ICD-10-CM | POA: Diagnosis not present

## 2022-05-30 DIAGNOSIS — M3501 Sicca syndrome with keratoconjunctivitis: Secondary | ICD-10-CM | POA: Diagnosis not present

## 2022-05-30 DIAGNOSIS — Z7960 Long term (current) use of unspecified immunomodulators and immunosuppressants: Secondary | ICD-10-CM | POA: Diagnosis not present

## 2022-06-19 ENCOUNTER — Other Ambulatory Visit: Payer: Self-pay | Admitting: Nurse Practitioner

## 2022-06-19 DIAGNOSIS — R519 Headache, unspecified: Secondary | ICD-10-CM

## 2022-06-19 NOTE — Telephone Encounter (Signed)
Requested medication (s) are due for refill today: yes  Requested medication (s) are on the active medication list: yes  Last refill:  03/13/22  Future visit scheduled: yes  Notes to clinic:  Unable to refill per protocol, cannot delegate.      Requested Prescriptions  Pending Prescriptions Disp Refills   ketoconazole (NIZORAL) 2 % shampoo [Pharmacy Med Name: Ketoconazole 2 % External Shampoo] 120 mL 0    Sig: SHAMPOO   TOPICALLY TO AFFECTED AREA TWICE A WEEK     Not Delegated - Over the Counter: OTC 2 Failed - 06/19/2022  9:10 AM      Failed - This refill cannot be delegated      Failed - Valid encounter within last 12 months    Recent Outpatient Visits           1 year ago Hyperlipidemia, unspecified hyperlipidemia type   [facility] Delsa Grana, PA-C   1 year ago Hyperlipidemia, unspecified hyperlipidemia type   [place], [place]   2 years ago Hyperlipidemia, unspecified hyperlipidemia type   [facility] Delsa Grana, PA-C   2 years ago Suspected COVID-19 virus infection   [facility] Delsa Grana, PA-C   2 years ago Class 2 severe obesity with serious comorbidity and body mass index (BMI) of 36.0 to 36.9 in adult, unspecified obesity type [facility])   [facility] Delsa Grana, [place]

## 2022-06-20 NOTE — Telephone Encounter (Signed)
Spoke with pt she is aware that prescription has been called in. She was driving therefore she will call back to schedule the appt. I also suggested that she can also schedule through mychart.

## 2022-06-27 ENCOUNTER — Ambulatory Visit (INDEPENDENT_AMBULATORY_CARE_PROVIDER_SITE_OTHER): Payer: Medicaid Other | Admitting: Family Medicine

## 2022-06-27 ENCOUNTER — Encounter: Payer: Self-pay | Admitting: Family Medicine

## 2022-06-27 VITALS — BP 126/74 | HR 81 | Temp 98.3°F | Resp 16 | Ht 60.0 in | Wt 198.2 lb

## 2022-06-27 DIAGNOSIS — E0520 Thyrotoxicosis with toxic multinodular goiter without thyrotoxic crisis or storm: Secondary | ICD-10-CM | POA: Diagnosis not present

## 2022-06-27 DIAGNOSIS — J3489 Other specified disorders of nose and nasal sinuses: Secondary | ICD-10-CM

## 2022-06-27 DIAGNOSIS — Z8614 Personal history of Methicillin resistant Staphylococcus aureus infection: Secondary | ICD-10-CM

## 2022-06-27 DIAGNOSIS — D75839 Thrombocytosis, unspecified: Secondary | ICD-10-CM

## 2022-06-27 DIAGNOSIS — E785 Hyperlipidemia, unspecified: Secondary | ICD-10-CM

## 2022-06-27 DIAGNOSIS — Z6838 Body mass index (BMI) 38.0-38.9, adult: Secondary | ICD-10-CM | POA: Diagnosis not present

## 2022-06-27 DIAGNOSIS — R7303 Prediabetes: Secondary | ICD-10-CM

## 2022-06-27 DIAGNOSIS — M8580 Other specified disorders of bone density and structure, unspecified site: Secondary | ICD-10-CM

## 2022-06-27 DIAGNOSIS — Z22322 Carrier or suspected carrier of Methicillin resistant Staphylococcus aureus: Secondary | ICD-10-CM

## 2022-06-27 NOTE — Progress Notes (Signed)
Patient ID: Sheena Martinez, female    DOB: 1961/03/02, 61 y.o.   MRN: [id number]  PCP: Delsa Grana, PA-C  Chief Complaint  Patient presents with   Pain    Nose Pain/sore onset for months, pt states had mrsa in one eye and would like to make sure she does not have it in her nose    Subjective:   Sheena Martinez is a 61 y.o. female, presents to clinic with CC of the following:  HPI  Pt presents with pain/soreness inside nose/nostril, onset months ago Nose running constantly prior to the sores She sees rheumatology and they said her methotrexate can cause mouth or nose sores Has tried - nothing Recent nasal/URI/allergy sx:  runny nose for months and then it stopped and now its dry   - swab from 03/01/22 of infection/I positive for MRSA on 03/04/22     Overdue for routine f/up  Adjustment anxiety/depression sx on paxil 20  PHQ-9 and GAD-7 reviewed    06/27/2022    1:19 PM 05/10/2021    1:27 PM 11/07/2020    2:42 PM  Depression screen PHQ 2/9  Decreased Interest 0 1 1  Down, Depressed, Hopeless 0 1 1  PHQ - 2 Score 0 2 2  Altered sleeping 0 1 1  Tired, decreased energy 0 1 1  Change in appetite 0 3 1  Feeling bad or failure about yourself  0 1 0  Trouble concentrating 0 0 1  Moving slowly or fidgety/restless 0 0 0  Suicidal thoughts 0 0 0  PHQ-9 Score 0 8 6  Difficult doing work/chores Not difficult at all Not difficult at all Extremely dIfficult      05/10/2021    1:34 PM 11/07/2020    2:45 PM 02/22/2020    9:46 AM 05/03/2019    1:35 PM  GAD 7 : Generalized Anxiety Score  Nervous, Anxious, on Edge 1 3 0 2  Control/stop worrying 1 3 0 1  Worry too much - different things '1 3 1 1  '$ Trouble relaxing 1 3 0 0  Restless 0 2 0 1  Easily annoyed or irritable 1 0 0 1  Afraid - awful might happen 1 3 0 0  Total GAD 7 Score '6 17 1 6  '$ Anxiety Difficulty Not difficult at all Extremely difficult Not difficult at all    History of hyperlipidemia, not currently taking any  lipid-lowering medications       Patient Active Problem List   Diagnosis Date Noted   History of breast cancer 09/11/2020   Steroid induced glaucoma, 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   Combined form of senile cataract of right eye 02/11/2020   Cataract 12/14/2019   Post-menopausal bleeding 10/25/2019   Osteopenia 03/18/2019   Facial flushing 10/21/2018   Tinnitus of both ears 10/21/2018   Drug-induced neutropenia (HCC) 10/20/2018   Thrombocytosis 10/20/2018   Toxic multinodular goiter 06/29/2018   Irregular menstrual cycle 03/02/2018   Screening for cervical cancer 03/02/2018   Long-term current use of tamoxifen 02/17/2018   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   Anxiety 10/03/2017   Bilateral primary osteoarthritis of knee 10/03/2017   Peripheral ulcerative keratitis 10/01/2017   Malignant neoplasm of upper-inner quadrant of left breast in female, estrogen receptor positive ([place]) 05/22/2017   Hirsutism 11/24/2015   Hyperlipidemia 02/01/2014   Impaired glucose tolerance 02/01/2014   Vitamin D deficiency 02/01/2014   Family history of diabetes mellitus 01/31/2014   Morbid obesity with BMI of 40.0-44.9,  adult [facility]) 01/31/2014   Sjogren's syndrome ([place]) 01/17/2014      Current Outpatient Medications:    acetaminophen (TYLENOL) 500 MG tablet, Take 1,000 mg by mouth every 8 (eight) hours as needed for mild pain or headache., Disp: , Rfl:    ALPHAGAN P 0.1 % SOLN, Place 1 drop into the left eye 3 (three) times daily., Disp: , Rfl:    anastrozole (ARIMIDEX) 1 MG tablet, Take 1 tablet (1 mg total) by mouth daily., Disp: 90 tablet, Rfl: 1   Ca Carbonate-Mag Hydroxide (ROLAIDS PO), Take 1 tablet by mouth as needed (indigestion)., Disp: , Rfl:    cholecalciferol (VITAMIN D) 1000 units tablet, Take 2,000 Units by mouth daily., Disp: , Rfl:    diclofenac (VOLTAREN) 50 MG EC tablet, Take 1 tablet by mouth 2 (two) times daily with a meal., Disp: , Rfl:    diclofenac Sodium (VOLTAREN) 1 % GEL,  Apply 2 g topically 4 (four) times daily., Disp: 100 g, Rfl: 3   dorzolamide-timolol (COSOPT) 22.3-6.8 MG/ML ophthalmic solution, Place 1 drop into the left eye 2 (two) times daily., Disp: , Rfl:    doxycycline (VIBRA-TABS) 100 MG tablet, Take 100 mg by mouth daily., Disp: , Rfl:    folic acid (FOLVITE) 1 MG tablet, Take 1 mg by mouth daily., Disp: , Rfl:    ketoconazole (NIZORAL) 2 % cream, Apply 1 application topically daily., Disp: , Rfl:    ketoconazole (NIZORAL) 2 % shampoo, SHAMPOO   TOPICALLY TO AFFECTED AREA TWICE A WEEK, Disp: 120 mL, Rfl: 0   Light Mineral Oil-Mineral Oil 0.5-0.5 % EMUL, Apply to eye., Disp: , Rfl:    methotrexate (RHEUMATREX) 2.5 MG tablet, Take 15 mg by mouth once a week., Disp: , Rfl:    PARoxetine (PAXIL) 20 MG tablet, Take 1 tablet by mouth once daily, Disp: 90 tablet, Rfl: 1   prednisoLONE acetate (PRED FORTE) 1 % ophthalmic suspension, , Disp: , Rfl:    vitamin k 100 MCG tablet, Take 100 mcg by mouth 2 (two) times a week., Disp: , Rfl:  No current facility-administered medications for this visit.  Facility-Administered Medications Ordered in Other Visits:    goserelin (ZOLADEX) injection 3.6 mg, 3.6 mg, Subcutaneous, Once, Earlie Server, MD   Allergies  Allergen Reactions   Rituximab Anaphylaxis   Benadryl [Diphenhydramine] Other (See Comments)    Patient felt like she was going to "crawl out of her skin"   Cortisone Other (See Comments)    Also reacts to cortisone injections; they make her flushed   Diphenhydramine Hcl    Clonazepam Anxiety   Sulfamethoxazole-Trimethoprim Rash    Chest tightness. Bactrim     Social History   Tobacco Use   Smoking status: Former    Years: 7.00    Types: Cigarettes    Quit date: 08/13/1979    Years since quitting: 42.9   Smokeless tobacco: Never   Tobacco comments:    [age]  Vaping Use   Vaping Use: Never used  Substance Use Topics   Alcohol use: No   Drug use: No      Chart Review Today: I personally  reviewed active problem list, medication list, allergies, family history, social history, health maintenance, notes from last encounter, lab results, imaging with the patient/caregiver today.   Review of Systems  Constitutional: Negative.   HENT: Negative.    Eyes: Negative.   Respiratory: Negative.    Cardiovascular: Negative.   Gastrointestinal: Negative.   Endocrine: Negative.  Genitourinary: Negative.   Musculoskeletal: Negative.   Skin: Negative.   Allergic/Immunologic: Negative.   Neurological: Negative.   Hematological: Negative.   Psychiatric/Behavioral: Negative.    All other systems reviewed and are negative.      Objective:   Vitals:   06/27/22 1320  BP: 126/74  Pulse: 81  Resp: 16  Temp: 98.3 F (36.8 C)  TempSrc: Oral  SpO2: 98%  Weight: 198 lb 3.2 oz (89.9 kg)  Height: 5' (1.524 m)    Body mass index is 38.71 kg/m.  Physical Exam Vitals and nursing note reviewed.  Constitutional:      General: She is not in acute distress.    Appearance: She is well-developed. She is obese. She is not ill-appearing, toxic-appearing or diaphoretic.  HENT:     Head: Normocephalic and atraumatic.     Right Ear: External ear normal.     Left Ear: External ear normal.     Nose: Nose normal. No congestion or rhinorrhea.     Comments: Distal tip of nose mildly erythematous Bilateral nasal mucosa, nasal septum extremely erythematous, generally tender to palpation    Mouth/Throat:     Mouth: Mucous membranes are dry.     Pharynx: Oropharynx is clear. No oropharyngeal exudate or posterior oropharyngeal erythema.  Eyes:     General:        Right eye: No discharge.        Left eye: No discharge.  Neck:     Trachea: No tracheal deviation.  Cardiovascular:     Rate and Rhythm: Normal rate and regular rhythm.  Pulmonary:     Effort: Pulmonary effort is normal. No respiratory distress.     Breath sounds: Normal breath sounds. No stridor.  Skin:    General: Skin is warm  and dry.     Findings: No rash.  Neurological:     Mental Status: She is alert.     Motor: No abnormal muscle tone.     Coordination: Coordination normal.  Psychiatric:        Behavior: Behavior normal.      Results for orders placed or performed in visit on 04/18/22  T4, free  Result Value Ref Range   Free T4 0.85 0.61 - 1.12 ng/dL       Assessment & Plan:   Patient presented for acute complaint today after lengthy discussion of her recent history with infections with ophthalmology, Sjogren's, meds with rheumatology and examining her and devising a plan for her nose pain and erythema she then asked for labs and routine follow-up.  She has not been in clinic for routine follow-up or any care for over a year. She does have a complicated medical history, is seeing multiple specialist History of hyperlipidemia, prediabetes, morbid obesity, immunocompromise, vitamin D deficiency, Sjogren's syndrome, osteopenia, thrombocytosis, history of breast cancer and long-term use of tamoxifen, also history of anxiety and depression on medications for that and it is not being managed anywhere else but here She sees rheumatology specialist at [facility], ophthalmology at [facility], endocrinology at [facility] and she is also consulted with general surgery recently -since she was last seen in this clinic with her primary care she has had more than 30 office visits elsewhere with specialist -attempted to briefly review her care and follow-up with specialist and recent changes to her health most significantly seems to be recurrent infections  Health Maintenance  Topic Date Due   COVID-19 Vaccine (1) 07/13/2022*   Zoster (Shingles) Vaccine (1 of 2)  09/27/2022*   Flu Shot  11/10/2022*   Mammogram  05/23/2023   Pap Smear  09/12/2023   Colon Cancer Screening  12/14/2027   Hepatitis C Screening: USPSTF Recommendation to screen - Ages 18-79 yo.  Completed   HIV Screening  Completed   HPV Vaccine  Aged Out   *Topic was postponed. The date shown is not the original due date.    Problem List Items Addressed This Visit       Endocrine   Toxic multinodular goiter    She has been managing this with endocrinology, most recent labs reviewed through care everywhere        Musculoskeletal and Integument   Osteopenia (Chronic)    Reviewed supplementation and weightbearing exercise, at higher risk with her diagnoses, deficiencies and tamoxifen use She has done a bone density in the past year with Dr. Tasia Catchings        Hematopoietic and Hemostatic   Thrombocytosis    Per hematology, office visits and labs reviewed        Other   Hyperlipidemia    Elevated cholesterol with associated obesity, not on any medications She wished for her labs to be rechecked today      Relevant Orders   Lipid panel (Completed)   Hemoglobin A1c (Completed)   RESOLVED: Morbid obesity with BMI of 40.0-44.9, adult ([place])   Other Visit Diagnoses     Nostril infection    -  Primary   Mild erythema and this to the tip of her nose and noted externally with diffuse erythema inside both nares to bilateral nasal septum   Relevant Medications   vancomycin (VANCOCIN) 25 mg/mL SOLN   mupirocin ointment (BACTROBAN) 2 %   Other Relevant Orders   MRSA culture (Completed)   Ambulatory referral to Infectious Disease   Personal history of MRSA (methicillin resistant Staphylococcus aureus)       Recurrent infections most recently eye infection-has been managed by ophthalmology consider ID consult   Relevant Medications   vancomycin (VANCOCIN) 25 mg/mL SOLN   mupirocin ointment (BACTROBAN) 2 %   Other Relevant Orders   MRSA culture (Completed)   Ambulatory referral to Infectious Disease   Prediabetes       Recheck labs per patient request   Relevant Orders   Lipid panel (Completed)   Hemoglobin A1c (Completed)   MRSA nasal colonization       Suspect she may have MRSA colonization in her nose or a MRSA infection currently,  culture obtained   Relevant Medications   vancomycin (VANCOCIN) 25 mg/mL SOLN   mupirocin ointment (BACTROBAN) 2 %   Other Relevant Orders   Ambulatory referral to Infectious Disease         Return in about 6 months (around 12/26/2022) for Annual Physical need routine f/up with CPE and other OV at least 2x a year.     Delsa Grana, PA-C 06/27/22 12:55 PM

## 2022-06-28 LAB — HEMOGLOBIN A1C
Hgb A1c MFr Bld: 5.5 % of total Hgb (ref <5.7)
Mean Plasma Glucose: 111 mg/dL
eAG (mmol/L): 6.2 mmol/L

## 2022-06-28 LAB — LIPID PANEL
Cholesterol: 209 mg/dL — ABNORMAL HIGH (ref <200)
HDL: 43 mg/dL — ABNORMAL LOW (ref >=50)
LDL Cholesterol (Calc): 136 mg/dL (calc) — ABNORMAL HIGH
Non-HDL Cholesterol (Calc): 166 mg/dL (calc) — ABNORMAL HIGH (ref <130)
Total CHOL/HDL Ratio: 4.9 (calc) (ref <5.0)
Triglycerides: 167 mg/dL — ABNORMAL HIGH (ref <150)

## 2022-06-30 LAB — MRSA CULTURE
MICRO NUMBER:: 14204648
SPECIMEN QUALITY:: ADEQUATE

## 2022-07-01 ENCOUNTER — Encounter: Payer: Self-pay | Admitting: Family Medicine

## 2022-07-01 ENCOUNTER — Telehealth: Payer: Self-pay | Admitting: Family Medicine

## 2022-07-01 MED ORDER — MUPIROCIN 2 % EX OINT: 1.0000 | TOPICAL_OINTMENT | Freq: Two times a day (BID) | CUTANEOUS | 0 refills | Status: AC

## 2022-07-01 NOTE — Telephone Encounter (Signed)
Called patient in regards to lab results. No answer, left VM to call back. Patient viewed results on mychart and provider's recommendations, called to see if patient had additional questions.

## 2022-07-01 NOTE — Telephone Encounter (Signed)
Pt is calling because she reviewed her results on Mychart. She would like to know what abnormal means for her MRSA culture . Please advise

## 2022-07-03 NOTE — Assessment & Plan Note (Addendum)
Reviewed supplementation and weightbearing exercise, at higher risk with her diagnoses, deficiencies and tamoxifen use She has done a bone density in the past year with Dr. Tasia Catchings

## 2022-07-03 NOTE — Assessment & Plan Note (Signed)
She has been managing this with endocrinology, most recent labs reviewed through care everywhere

## 2022-07-03 NOTE — Assessment & Plan Note (Signed)
Elevated cholesterol with associated obesity, not on any medications She wished for her labs to be rechecked today

## 2022-07-03 NOTE — Assessment & Plan Note (Signed)
Per hematology, office visits and labs reviewed

## 2022-07-07 DIAGNOSIS — F419 Anxiety disorder, unspecified: Secondary | ICD-10-CM | POA: Diagnosis not present

## 2022-07-07 DIAGNOSIS — M3500 Sicca syndrome, unspecified: Secondary | ICD-10-CM | POA: Diagnosis not present

## 2022-07-07 DIAGNOSIS — H16002 Unspecified corneal ulcer, left eye: Secondary | ICD-10-CM | POA: Diagnosis not present

## 2022-07-07 DIAGNOSIS — F32A Depression, unspecified: Secondary | ICD-10-CM | POA: Diagnosis not present

## 2022-07-08 DIAGNOSIS — F32A Depression, unspecified: Secondary | ICD-10-CM | POA: Diagnosis not present

## 2022-07-08 DIAGNOSIS — H16002 Unspecified corneal ulcer, left eye: Secondary | ICD-10-CM | POA: Diagnosis not present

## 2022-07-08 DIAGNOSIS — F419 Anxiety disorder, unspecified: Secondary | ICD-10-CM | POA: Diagnosis not present

## 2022-07-08 DIAGNOSIS — M3500 Sicca syndrome, unspecified: Secondary | ICD-10-CM | POA: Diagnosis not present

## 2022-07-11 DIAGNOSIS — B49 Unspecified mycosis: Secondary | ICD-10-CM | POA: Diagnosis not present

## 2022-07-11 DIAGNOSIS — Z20822 Contact with and (suspected) exposure to covid-19: Secondary | ICD-10-CM | POA: Diagnosis not present

## 2022-07-11 DIAGNOSIS — H44002 Unspecified purulent endophthalmitis, left eye: Secondary | ICD-10-CM | POA: Diagnosis not present

## 2022-07-11 DIAGNOSIS — H5789 Other specified disorders of eye and adnexa: Secondary | ICD-10-CM | POA: Diagnosis not present

## 2022-07-16 ENCOUNTER — Ambulatory Visit: Payer: Medicaid Other | Attending: Infectious Diseases | Admitting: Infectious Diseases

## 2022-07-16 ENCOUNTER — Encounter: Payer: Self-pay | Admitting: Infectious Diseases

## 2022-07-16 VITALS — BP 127/84 | HR 72 | Temp 97.2°F | Ht 62.0 in | Wt 192.0 lb

## 2022-07-16 DIAGNOSIS — Z8614 Personal history of Methicillin resistant Staphylococcus aureus infection: Secondary | ICD-10-CM | POA: Insufficient documentation

## 2022-07-16 DIAGNOSIS — Z853 Personal history of malignant neoplasm of breast: Secondary | ICD-10-CM | POA: Insufficient documentation

## 2022-07-16 DIAGNOSIS — E119 Type 2 diabetes mellitus without complications: Secondary | ICD-10-CM | POA: Diagnosis not present

## 2022-07-16 DIAGNOSIS — A4902 Methicillin resistant Staphylococcus aureus infection, unspecified site: Secondary | ICD-10-CM | POA: Diagnosis not present

## 2022-07-16 DIAGNOSIS — M3500 Sicca syndrome, unspecified: Secondary | ICD-10-CM | POA: Diagnosis not present

## 2022-07-16 MED ORDER — CHLORHEXIDINE GLUCONATE 2 % EX LIQD: 15.0000 mL | Freq: Every day | CUTANEOUS | 0 refills | Status: DC

## 2022-07-16 NOTE — Progress Notes (Signed)
NAME: Sheena Martinez  DOB: [birth year]  MRN: [id number]  Date/Time: 07/16/2022 9:34 AM   Subjective:   Here with her friend  Sheena Martinez is a 61 y.o. 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 for which she is managed at [facility] by [facility] with topical antifungal x 2 and oral voriconazole is referred to me by her PCP for MRSA decolonization Pt saw her PCP on 06/27/22 for nose soreness and pain and was concerned it could be MRSA as she had a left eye culture positive for MRSA in July 2023 and was treated with focal vanco drops then MRSA nares came back positive and she is referred to me She has never had any boils or abscess son the skin Lives with her husband , son and 2 grand kids and no one has MRSA She has been on methotrexate till last Wednesday- She will not be taking it for some time going forward till the left eye heals      Past Surgical History:  Procedure Laterality Date   BREAST BIOPSY Left 05/12/2017   [place] bx/ INVASIVE MAMMARY CARCINOMA   BREAST LUMPECTOMY Left 05/2017   invasive mammary carcinoma, neg margins   BREAST LUMPECTOMY WITH SENTINEL LYMPH NODE BIOPSY Left 06/10/2017   Procedure: BREAST LUMPECTOMY WITH SENTINEL LYMPH NODE BX AND NEEDLE LOCALIZATION;  Surgeon: Robert Bellow, MD;  Location: [facility];  Service: General;  Laterality: Left;   BREAST MAMMOSITE Left 06/24/2017   Procedure: MAMMOSITE BREAST;  Surgeon: Robert Bellow, MD;  Location: [facility];  Service: General;  Laterality: Left;   [place]  11/2017   [facility]   COSMETIC SURGERY     CYST EXCISION  2018   pilar cyst/ Dr Will Bonnet   EYE SURGERY     PORTACATH PLACEMENT Right 07/08/2017   Procedure: INSERTION PORT-A-CATH;  Surgeon: Robert Bellow, MD;  Location: [facility];  Service: General;  Laterality:  Right;   TONSILLECTOMY      Social History   Socioeconomic History   Marital status: Married    Spouse name: Eulas Post   Number of children: 3   Years of education: Not on file   Highest education level: GED or equivalent  Occupational History   Occupation: unemployed  Tobacco Use   Smoking status: Former    Years: 7.00    Types: Cigarettes    Quit date: 08/13/1979    Years since quitting: 42.9   Smokeless tobacco: Never   Tobacco comments:    AGE [age]  Vaping Use   Vaping Use: Never used  Substance and Sexual Activity   Alcohol use: No   Drug use: No   Sexual activity: Yes    Partners: Male  Other Topics Concern   Not on file  Social History Narrative   Not on file   Social Determinants of Health   Financial Resource Strain: Not on file  Food Insecurity: No Food Insecurity (06/19/2017)   Hunger Vital Sign    Worried About Running Out of Food in the Last Year: Never true    Ran Out of Food in the Last Year: Never true  Transportation Needs: No Transportation Needs (06/19/2017)   PRAPARE - Hydrologist (Medical): No    Lack of Transportation (Non-Medical): No  Physical Activity: Inactive (07/24/2018)  Exercise Vital Sign    Days of Exercise per Week: 0 days    Minutes of Exercise per Session: 0 min  Stress: Stress Concern Present (07/24/2018)   [place]    Feeling of Stress : Very much  Social Connections: Socially Integrated (07/24/2018)   Social Connection and Isolation Panel [NHANES]    Frequency of Communication with Friends and Family: More than three times a week    Frequency of Social Gatherings with Friends and Family: More than three times a week    Attends Religious Services: More than 4 times per year    Active Member of [employer] or Organizations: Yes    Attends Music therapist: More than 4 times per year    Marital Status: Married  Arboriculturist Violence: Not At Risk (07/24/2018)   Humiliation, Afraid, Rape, and Kick questionnaire    Fear of Current or Ex-Partner: No    Emotionally Abused: No    Physically Abused: No    Sexually Abused: No    Family History  Problem Relation Age of Onset   Breast cancer Maternal Aunt 82       currently ~65   Diabetes Mother    Skin cancer Mother        currently 34; TAH/BSO (age?)   Anemia Mother    Liver disease Father        deceased / not much info about him / alcoholic   Lung cancer Paternal Aunt        smoker / deceased 24s   Stomach cancer Paternal Uncle 2       deceased / deceased 74s   Ovarian cancer Maternal Grandmother 92       deceased 36s   Stomach cancer Paternal Grandfather 98       deceased 86s   Cancer Maternal Uncle        unk. type; deceased 30s   Leukemia Cousin 44       deceased 52   Allergies  Allergen Reactions   Rituximab Anaphylaxis   Benadryl [Diphenhydramine] Other (See Comments)    Patient felt like she was going to "crawl out of her skin"   Clonazepam Anxiety and Other (See Comments)   Cortisone Other (See Comments)    Also reacts to cortisone injections; they make her flushed   Diphenhydramine Hcl    Sulfamethoxazole-Trimethoprim Rash    Chest tightness. Bactrim   I? Current Outpatient Medications  Medication Sig Dispense Refill   acetaminophen (TYLENOL) 500 MG tablet Take 1,000 mg by mouth every 8 (eight) hours as needed for mild pain or headache.     ALPHAGAN P 0.1 % SOLN Place 1 drop into the left eye 3 (three) times daily.     anastrozole (ARIMIDEX) 1 MG tablet Take 1 tablet (1 mg total) by mouth daily. 90 tablet 1   Ca Carbonate-Mag Hydroxide (ROLAIDS PO) Take 1 tablet by mouth as needed (indigestion).     cholecalciferol (VITAMIN D) 1000 units tablet Take 2,000 Units by mouth daily.     diclofenac (VOLTAREN) 50 MG EC tablet Take 1 tablet by mouth 2 (two) times daily with a meal.     diclofenac Sodium (VOLTAREN) 1 % GEL Apply 2 g  topically 4 (four) times daily. 100 g 3   dorzolamide-timolol (COSOPT) 22.3-6.8 MG/ML ophthalmic solution Place 1 drop into the left eye 2 (two) times daily.     doxycycline (ADOXA) 100 MG tablet Take  100 mg by mouth daily.     folic acid (FOLVITE) 1 MG tablet Take 1 mg by mouth daily.     ketoconazole (NIZORAL) 2 % cream Apply 1 application topically daily.     ketoconazole (NIZORAL) 2 % shampoo SHAMPOO   TOPICALLY TO AFFECTED AREA TWICE A WEEK 120 mL 0   Light Mineral Oil-Mineral Oil 0.5-0.5 % EMUL Apply to eye.     LORazepam (ATIVAN) 0.5 MG tablet Take 0.5 mg by mouth 2 (two) times daily as needed.     methotrexate (RHEUMATREX) 2.5 MG tablet Take 15 mg by mouth once a week.     natamycin (NATACYN) 5 % ophthalmic suspension Place 1 drop into the left eye every 2 (two) hours while awake.     NON FORMULARY voriconazole (VFEND) ophthalmic solution 1 Administer 1 drop into the left eye every hour for 14 days. Discard after 7 days and refill for full 14 days.     PARoxetine (PAXIL) 20 MG tablet Take 1 tablet by mouth once daily 90 tablet 1   prednisoLONE acetate (PRED FORTE) 1 % ophthalmic suspension      vitamin k 100 MCG tablet Take 100 mcg by mouth 2 (two) times a week.     voriconazole (VFEND) 200 MG tablet Take 200 mg by mouth 2 (two) times daily.     Water For Injection Sterile (STERILE WATER, PRESERVATIVE FREE,) injection      No current facility-administered medications for this visit.   Facility-Administered Medications Ordered in Other Visits  Medication Dose Route Frequency Provider Last Rate Last Admin   goserelin (ZOLADEX) injection 3.6 mg  3.6 mg Subcutaneous Once Earlie Server, MD         Abtx:  Anti-infectives (From admission, onward)    None       REVIEW OF SYSTEMS:  Const: negative fever, negative chills, negative weight loss Eyes: left eye sticky, painful Dryness both eyes ENT: negative coryza, negative sore throat Resp: negative cough, hemoptysis, dyspnea Cards:  negative for chest pain, palpitations, lower extremity edema GU: negative for frequency, dysuria and hematuria GI: Negative for abdominal pain, diarrhea, bleeding, constipation Skin: negative for rash and pruritus Heme: negative for easy bruising and gum/nose bleeding MS: negative for myalgias, arthralgias, back pain and muscle weakness Neurolo:negative for headaches, dizziness, vertigo, memory problems  Psych: anxiety, depression  Endocrine: hyperthyroidism diabetes Allergy/Immunology- as above: Objective:  VITALS:  BP 127/84   Pulse 72   Temp (!) 97.2 F (36.2 C) (Temporal)   Ht '5\' 2"'$  (1.575 m)   Wt 192 lb (87.1 kg)   BMI 35.12 kg/m   PHYSICAL EXAM:  General: Alert, cooperative, no distress, appears stated age.  Head: Normocephalic, without obvious abnormality, atraumatic. Eyes: left eye is closed ENT Nares normal. No drainage or sinus tenderness. Lips, mucosa, and tongue normal. No Thrush Neck: Supple, symmetrical, no adenopathy, thyroid: non tender no carotid bruit and no JVD. Back: No CVA tenderness. Lungs: Clear to auscultation bilaterally. No Wheezing or Rhonchi. No rales. Heart: Regular rate and rhythm, no murmur, rub or gallop. Abdomen: Soft, non-tender,not distended. Bowel sounds normal. No masses Extremities: atraumatic, no cyanosis. No edema. No clubbing Skin: No rashes or lesions. Or bruising Lymph: Cervical, supraclavicular normal. Neurologic: Grossly non-focal Pertinent Labs 06/27/22 nares PCR- MRSA 07/13/22 CBC/CMP okay 06/2622 Opthal culture purpureocillium    ? Impression/Recommendation ?MRSA colonization- nares Also had MRSA eye infection- blepharitis Treated and repeat culture neg  Will decolonize with Mupirocin nares ointment + CHG baths and  other protocol which I shared with her  Fungal keratitis- followed at [facility] I did not address that in this visit. She will continue to follow with them  H/o ca breast treated  Sjogrens- on  methotrexate  DM  Discussed the management with patient in detail  Follow PRN

## 2022-07-16 NOTE — Patient Instructions (Addendum)
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 To ensure that your skin is in good condition, follow the Routine Skin Care principles below to reduce drying and enhance healing. Do not start while you have any active boils. Routine Skin Care principles to reduce drying and enhance healing: Avoid the use of soap when bathing or showering when performing this decolonization. DO NOT routinely use antiseptic solutions. If you need to use something, chose a soap substitute (examples -QV Wash or Cetaphil).  When drying with a towel, be gentle and pat dry your skin. Avoid rubbing the skin.  Reduce the overall frequency of bathing or showering. A short shower (3 minutes) is better than a bath in terms of its effect on the skin.  Use a simple sorbelene based-cream on your skin prior to showering and immediately after drying. (Examples: Hydroderm or other Sorbelene-based preparations). Especially protect healing or dry areas of skin in this way. Don't use a barrier cream with a vaseline base or with perfumes and additives. You are more likely to be allergic to these products, and cause further damage to your skin.  Make sure that you clean and cover any skin cuts or grazes that occur. Try to avoid picking or biting fingernails and the skin around the nails Keep your fingernails clipped short and clean to reduce problems caused by scratching.  For itchy skin, try gently massaging sorbelene-based cream into itchy areas instead of scratching it. A long-acting non-sedating antihistamine drug is the next option to try. However make sure that you are not taking medication  that will interact with this drug type.                                                                         To prepare yourself for your treatment, it is recommended that you complete the following steps: Remove nose, ear and other body piercing items for several days prior to the treatment and keep them out during the treatment period  Purchase a new toothbrush, disposable razor (if used), sterident for dentures (if required) and a container of alcohol hand hygiene solution (gel or rub)  Discard old toothbrushes and razors when the treatment starts. Also discard opened deodorant rollers, skin adhesive tapes, skin creams and solutions- all of these may already be contaminated with staph  Discard pumice stone(s), sponges and disposable face cloths if used   Discard all make-up brushes, creams, and implements  Discard or hot wash all fluffy toys  Wash hair brush and comb, nail files, plastic toys and cutters in the dishwasher or purchase new ones  Remove nail varnish and artificial nails  Daily routine for 5 days     *Minimize contact with members of the community during the 5 days of treatment as much as possible* Body washes The effectiveness of the program increases if the correct procedure is  used: Apply the provided antiseptic body wash (2% chlorhexidine) in the shower daily  Take care to wash hair, under the arms and into the groin and into any folds of skin  Allow the antiseptic to remain on the skin for at least 5 minutes  Nasal ointment Disinfect your hands with alcohol gel/rub and allow to dry   Open the mupirocin 2% (Bactroban) nasal ointment.  Place small amount (size of match head) of ointment onto a clean cotton swab and massage gently around the inside of the nostril on one side, making sure not to insert it too deeply (no more than 2 cm or a little less than an inch).  Use a new cotton bud for the other nostril so that you do not contaminate the Bactroban tube with staph.    After applying the ointment, press a finger against the nose next to the nostril opening and use a circular motion to spread the ointment within the nose  Apply the mupirocin ointment two times a day for 5 days  Disinfect your hands with alcohol rub/gel after applying the ointmentp>  Personal items (combs, razor, eyeglasses, jewelry, etc.)     Disinfect all personal items daily with an alcohol-based cleanser  House Environment and Clothes/Linens On day 2 and 5 of the treatment, clean your house, (especially the bedroom and bathroom). Clean dust off all surfaces and then vacuum clean all floor surfaces AND soft furnishings (such as your favorite chair). If your chair/couch has a vinyl or leather covering then wipe over the chair with warm soapy water and then dry with a clean towel (which should then be washed). Staph lives in skin scales from humans that contaminate the environment. This can lead to re-infection.   Disinfect the shower floor and/or bath tub daily   On days 1, 3, AND 5 of the treatment wash your clothes, underwear, pajamas and bed linen (such as towels, sheets, washcloths, and bath mats). A hot wash with laundry detergent is best (there is no need to use expensive laundry detergent or powder). Dry clothes in sun if possible. Change into clean clothes or pajamas on those days after your shower.   Do not share or exchange any personal items of clothing  Sports/Gym     Surfaces, equipment and towels, and skin-to-skin contact are all potential sources for staph re-infection Pets Dogs and other companion animals can also be colonized with the same strains of MRSA. Best to wash or replace bedding material for the animal and wash the animal at least once during the treatment period with antiseptic solution (2% chlorhexidine wash). Ensure that the skin of the animal is kept in good condition. If the pet has any chronic skin disorders, consult your vet prior to starting your treatment  process. What about my partner, family, or household members? Usually when an aggressive strain of staph moves in to a family or household, only certain members of that group get infections (boils). This is despite the fact that the strain has probably transferred itself from person to person within the group. Those without boils may also be carrying the bacterium, however they must have better resistance (immunity) or perhaps have better skin condition. Staph likes to invade through cuts, scratches and skin with dermatitis or dryness.

## 2022-07-29 DIAGNOSIS — Z79899 Other long term (current) drug therapy: Secondary | ICD-10-CM | POA: Diagnosis not present

## 2022-07-29 DIAGNOSIS — H16002 Unspecified corneal ulcer, left eye: Secondary | ICD-10-CM | POA: Diagnosis not present

## 2022-08-01 DIAGNOSIS — E038 Other specified hypothyroidism: Secondary | ICD-10-CM | POA: Diagnosis not present

## 2022-08-08 DIAGNOSIS — M3500 Sicca syndrome, unspecified: Secondary | ICD-10-CM | POA: Diagnosis not present

## 2022-08-08 DIAGNOSIS — H16002 Unspecified corneal ulcer, left eye: Secondary | ICD-10-CM | POA: Diagnosis not present

## 2022-08-08 DIAGNOSIS — Z6837 Body mass index (BMI) 37.0-37.9, adult: Secondary | ICD-10-CM | POA: Diagnosis not present

## 2022-08-08 DIAGNOSIS — E669 Obesity, unspecified: Secondary | ICD-10-CM | POA: Diagnosis not present

## 2022-08-08 DIAGNOSIS — Z87891 Personal history of nicotine dependence: Secondary | ICD-10-CM | POA: Diagnosis not present

## 2022-08-08 DIAGNOSIS — Z8349 Family history of other endocrine, nutritional and metabolic diseases: Secondary | ICD-10-CM | POA: Diagnosis not present

## 2022-08-08 DIAGNOSIS — Z79899 Other long term (current) drug therapy: Secondary | ICD-10-CM | POA: Diagnosis not present

## 2022-08-08 DIAGNOSIS — Z923 Personal history of irradiation: Secondary | ICD-10-CM | POA: Diagnosis not present

## 2022-08-08 DIAGNOSIS — Z79811 Long term (current) use of aromatase inhibitors: Secondary | ICD-10-CM | POA: Diagnosis not present

## 2022-08-08 DIAGNOSIS — F419 Anxiety disorder, unspecified: Secondary | ICD-10-CM | POA: Diagnosis not present

## 2022-08-08 DIAGNOSIS — Z791 Long term (current) use of non-steroidal anti-inflammatories (NSAID): Secondary | ICD-10-CM | POA: Diagnosis not present

## 2022-08-08 DIAGNOSIS — E785 Hyperlipidemia, unspecified: Secondary | ICD-10-CM | POA: Diagnosis not present

## 2022-08-08 DIAGNOSIS — R002 Palpitations: Secondary | ICD-10-CM | POA: Diagnosis not present

## 2022-08-08 DIAGNOSIS — C50912 Malignant neoplasm of unspecified site of left female breast: Secondary | ICD-10-CM | POA: Diagnosis not present

## 2022-08-08 DIAGNOSIS — E0590 Thyrotoxicosis, unspecified without thyrotoxic crisis or storm: Secondary | ICD-10-CM | POA: Diagnosis not present

## 2022-08-08 DIAGNOSIS — E049 Nontoxic goiter, unspecified: Secondary | ICD-10-CM | POA: Diagnosis not present

## 2022-08-14 DIAGNOSIS — H16002 Unspecified corneal ulcer, left eye: Secondary | ICD-10-CM | POA: Diagnosis not present

## 2022-08-14 DIAGNOSIS — B49 Unspecified mycosis: Secondary | ICD-10-CM | POA: Diagnosis not present

## 2022-08-16 DIAGNOSIS — E0590 Thyrotoxicosis, unspecified without thyrotoxic crisis or storm: Secondary | ICD-10-CM | POA: Diagnosis not present

## 2022-08-16 DIAGNOSIS — H44002 Unspecified purulent endophthalmitis, left eye: Secondary | ICD-10-CM | POA: Diagnosis not present

## 2022-08-16 DIAGNOSIS — Z882 Allergy status to sulfonamides status: Secondary | ICD-10-CM | POA: Diagnosis not present

## 2022-08-16 DIAGNOSIS — Z170 Estrogen receptor positive status [ER+]: Secondary | ICD-10-CM | POA: Diagnosis not present

## 2022-08-16 DIAGNOSIS — Z947 Corneal transplant status: Secondary | ICD-10-CM | POA: Diagnosis not present

## 2022-08-16 DIAGNOSIS — E785 Hyperlipidemia, unspecified: Secondary | ICD-10-CM | POA: Diagnosis not present

## 2022-08-16 DIAGNOSIS — Z79811 Long term (current) use of aromatase inhibitors: Secondary | ICD-10-CM | POA: Diagnosis not present

## 2022-08-16 DIAGNOSIS — E559 Vitamin D deficiency, unspecified: Secondary | ICD-10-CM | POA: Diagnosis not present

## 2022-08-16 DIAGNOSIS — Z87891 Personal history of nicotine dependence: Secondary | ICD-10-CM | POA: Diagnosis not present

## 2022-08-16 DIAGNOSIS — H579 Unspecified disorder of eye and adnexa: Secondary | ICD-10-CM | POA: Diagnosis not present

## 2022-08-16 DIAGNOSIS — Z888 Allergy status to other drugs, medicaments and biological substances status: Secondary | ICD-10-CM | POA: Diagnosis not present

## 2022-08-16 DIAGNOSIS — F419 Anxiety disorder, unspecified: Secondary | ICD-10-CM | POA: Diagnosis not present

## 2022-08-16 DIAGNOSIS — M3500 Sicca syndrome, unspecified: Secondary | ICD-10-CM | POA: Diagnosis not present

## 2022-09-01 DIAGNOSIS — E785 Hyperlipidemia, unspecified: Secondary | ICD-10-CM | POA: Diagnosis not present

## 2022-09-01 DIAGNOSIS — H169 Unspecified keratitis: Secondary | ICD-10-CM | POA: Diagnosis not present

## 2022-09-01 DIAGNOSIS — H16072 Perforated corneal ulcer, left eye: Secondary | ICD-10-CM | POA: Diagnosis not present

## 2022-09-01 DIAGNOSIS — E0590 Thyrotoxicosis, unspecified without thyrotoxic crisis or storm: Secondary | ICD-10-CM | POA: Diagnosis not present

## 2022-09-01 DIAGNOSIS — F419 Anxiety disorder, unspecified: Secondary | ICD-10-CM | POA: Diagnosis not present

## 2022-09-01 DIAGNOSIS — Z882 Allergy status to sulfonamides status: Secondary | ICD-10-CM | POA: Diagnosis not present

## 2022-09-01 DIAGNOSIS — Z87891 Personal history of nicotine dependence: Secondary | ICD-10-CM | POA: Diagnosis not present

## 2022-09-01 DIAGNOSIS — Z853 Personal history of malignant neoplasm of breast: Secondary | ICD-10-CM | POA: Diagnosis not present

## 2022-09-01 DIAGNOSIS — Z9889 Other specified postprocedural states: Secondary | ICD-10-CM | POA: Diagnosis not present

## 2022-09-01 DIAGNOSIS — Z888 Allergy status to other drugs, medicaments and biological substances status: Secondary | ICD-10-CM | POA: Diagnosis not present

## 2022-09-09 DIAGNOSIS — H16012 Central corneal ulcer, left eye: Secondary | ICD-10-CM | POA: Diagnosis not present

## 2022-09-09 DIAGNOSIS — H16002 Unspecified corneal ulcer, left eye: Secondary | ICD-10-CM | POA: Diagnosis not present

## 2022-09-09 DIAGNOSIS — Z5181 Encounter for therapeutic drug level monitoring: Secondary | ICD-10-CM | POA: Diagnosis not present

## 2022-09-18 DIAGNOSIS — B470 Eumycetoma: Secondary | ICD-10-CM | POA: Diagnosis not present

## 2022-09-18 DIAGNOSIS — B49 Unspecified mycosis: Secondary | ICD-10-CM | POA: Diagnosis not present

## 2022-09-18 DIAGNOSIS — H16002 Unspecified corneal ulcer, left eye: Secondary | ICD-10-CM | POA: Diagnosis not present

## 2022-09-20 ENCOUNTER — Other Ambulatory Visit: Payer: Self-pay | Admitting: Family Medicine

## 2022-09-20 DIAGNOSIS — F4323 Adjustment disorder with mixed anxiety and depressed mood: Secondary | ICD-10-CM

## 2022-09-20 NOTE — Telephone Encounter (Signed)
Spoke with pt and was informed that her prescription has been sent to pharmacy. Also informed her to schedule a 67mfollow up visit. She verbalized understanding and stated she is currently driving and will call back to schedule.

## 2022-10-17 ENCOUNTER — Other Ambulatory Visit: Payer: Self-pay | Admitting: Family Medicine

## 2022-10-17 ENCOUNTER — Inpatient Hospital Stay: Payer: Medicaid Other | Attending: Oncology

## 2022-10-17 ENCOUNTER — Inpatient Hospital Stay (HOSPITAL_BASED_OUTPATIENT_CLINIC_OR_DEPARTMENT_OTHER): Payer: Medicaid Other | Admitting: Oncology

## 2022-10-17 ENCOUNTER — Encounter: Payer: Self-pay | Admitting: Oncology

## 2022-10-17 VITALS — BP 133/63 | HR 63 | Temp 96.8°F | Resp 18 | Wt 181.8 lb

## 2022-10-17 DIAGNOSIS — Z8041 Family history of malignant neoplasm of ovary: Secondary | ICD-10-CM | POA: Insufficient documentation

## 2022-10-17 DIAGNOSIS — M3500 Sicca syndrome, unspecified: Secondary | ICD-10-CM | POA: Diagnosis not present

## 2022-10-17 DIAGNOSIS — C50212 Malignant neoplasm of upper-inner quadrant of left female breast: Secondary | ICD-10-CM | POA: Insufficient documentation

## 2022-10-17 DIAGNOSIS — E0590 Thyrotoxicosis, unspecified without thyrotoxic crisis or storm: Secondary | ICD-10-CM | POA: Insufficient documentation

## 2022-10-17 DIAGNOSIS — F419 Anxiety disorder, unspecified: Secondary | ICD-10-CM | POA: Insufficient documentation

## 2022-10-17 DIAGNOSIS — Z808 Family history of malignant neoplasm of other organs or systems: Secondary | ICD-10-CM | POA: Diagnosis not present

## 2022-10-17 DIAGNOSIS — R232 Flushing: Secondary | ICD-10-CM | POA: Diagnosis not present

## 2022-10-17 DIAGNOSIS — Z79810 Long term (current) use of selective estrogen receptor modulators (SERMs): Secondary | ICD-10-CM | POA: Diagnosis not present

## 2022-10-17 DIAGNOSIS — Z800 Family history of malignant neoplasm of digestive organs: Secondary | ICD-10-CM | POA: Insufficient documentation

## 2022-10-17 DIAGNOSIS — Z803 Family history of malignant neoplasm of breast: Secondary | ICD-10-CM | POA: Insufficient documentation

## 2022-10-17 DIAGNOSIS — Z833 Family history of diabetes mellitus: Secondary | ICD-10-CM | POA: Insufficient documentation

## 2022-10-17 DIAGNOSIS — Z881 Allergy status to other antibiotic agents status: Secondary | ICD-10-CM | POA: Insufficient documentation

## 2022-10-17 DIAGNOSIS — Z170 Estrogen receptor positive status [ER+]: Secondary | ICD-10-CM | POA: Insufficient documentation

## 2022-10-17 DIAGNOSIS — E282 Polycystic ovarian syndrome: Secondary | ICD-10-CM | POA: Diagnosis not present

## 2022-10-17 DIAGNOSIS — Z923 Personal history of irradiation: Secondary | ICD-10-CM | POA: Insufficient documentation

## 2022-10-17 DIAGNOSIS — Z79811 Long term (current) use of aromatase inhibitors: Secondary | ICD-10-CM | POA: Diagnosis not present

## 2022-10-17 DIAGNOSIS — E785 Hyperlipidemia, unspecified: Secondary | ICD-10-CM | POA: Diagnosis not present

## 2022-10-17 DIAGNOSIS — Z888 Allergy status to other drugs, medicaments and biological substances status: Secondary | ICD-10-CM | POA: Diagnosis not present

## 2022-10-17 DIAGNOSIS — M8580 Other specified disorders of bone density and structure, unspecified site: Secondary | ICD-10-CM | POA: Diagnosis not present

## 2022-10-17 DIAGNOSIS — Z79899 Other long term (current) drug therapy: Secondary | ICD-10-CM | POA: Diagnosis not present

## 2022-10-17 DIAGNOSIS — R748 Abnormal levels of other serum enzymes: Secondary | ICD-10-CM | POA: Diagnosis not present

## 2022-10-17 DIAGNOSIS — Z8379 Family history of other diseases of the digestive system: Secondary | ICD-10-CM | POA: Diagnosis not present

## 2022-10-17 DIAGNOSIS — Z832 Family history of diseases of the blood and blood-forming organs and certain disorders involving the immune mechanism: Secondary | ICD-10-CM | POA: Diagnosis not present

## 2022-10-17 DIAGNOSIS — Z801 Family history of malignant neoplasm of trachea, bronchus and lung: Secondary | ICD-10-CM | POA: Diagnosis not present

## 2022-10-17 DIAGNOSIS — Z87891 Personal history of nicotine dependence: Secondary | ICD-10-CM | POA: Insufficient documentation

## 2022-10-17 DIAGNOSIS — M2550 Pain in unspecified joint: Secondary | ICD-10-CM | POA: Insufficient documentation

## 2022-10-17 LAB — COMPREHENSIVE METABOLIC PANEL
ALT: 30 U/L (ref 0–44)
AST: 29 U/L (ref 15–41)
Albumin: 3.7 g/dL (ref 3.5–5.0)
Alkaline Phosphatase: 136 U/L — ABNORMAL HIGH (ref 38–126)
Anion gap: 6 (ref 5–15)
BUN: 16 mg/dL (ref 8–23)
CO2: 26 mmol/L (ref 22–32)
Calcium: 9 mg/dL (ref 8.9–10.3)
Chloride: 106 mmol/L (ref 98–111)
Creatinine, Ser: 0.61 mg/dL (ref 0.44–1.00)
GFR, Estimated: >60 mL/min (ref >60)
Glucose, Bld: 97 mg/dL (ref 70–99)
Potassium: 4.1 mmol/L (ref 3.5–5.1)
Sodium: 138 mmol/L (ref 135–145)
Total Bilirubin: 0.3 mg/dL (ref 0.3–1.2)
Total Protein: 6.7 g/dL (ref 6.5–8.1)

## 2022-10-17 LAB — CBC WITH DIFFERENTIAL/PLATELET
Abs Immature Granulocytes: 0.01 10*3/uL (ref 0.00–0.07)
Basophils Absolute: 0 10*3/uL (ref 0.0–0.1)
Basophils Relative: 0 %
Eosinophils Absolute: 0.1 10*3/uL (ref 0.0–0.5)
Eosinophils Relative: 2 %
HCT: 39.5 % (ref 36.0–46.0)
Hemoglobin: 12.9 g/dL (ref 12.0–15.0)
Immature Granulocytes: 0 %
Lymphocytes Relative: 40 %
Lymphs Abs: 2.1 10*3/uL (ref 0.7–4.0)
MCH: 31.2 pg (ref 26.0–34.0)
MCHC: 32.7 g/dL (ref 30.0–36.0)
MCV: 95.6 fL (ref 80.0–100.0)
Monocytes Absolute: 0.6 10*3/uL (ref 0.1–1.0)
Monocytes Relative: 11 %
Neutro Abs: 2.5 10*3/uL (ref 1.7–7.7)
Neutrophils Relative %: 47 %
Platelets: 291 10*3/uL (ref 150–400)
RBC: 4.13 MIL/uL (ref 3.87–5.11)
RDW: 13.8 % (ref 11.5–15.5)
WBC: 5.3 10*3/uL (ref 4.0–10.5)
nRBC: 0 % (ref 0.0–0.2)

## 2022-10-17 MED ORDER — ANASTROZOLE 1 MG PO TABS: 1.0000 mg | ORAL_TABLET | Freq: Every day | ORAL | 1 refills | Status: DC

## 2022-10-17 NOTE — Assessment & Plan Note (Signed)
recommend patient to continue calcium and vitamin D supplementation.  06/19/21 DEXA, osteopenia has improved- normalized

## 2022-10-17 NOTE — Assessment & Plan Note (Addendum)
Stage IA, left Breast cancer.  She is 60, LMP was more than 2 years ago. FSH>40, postmenopausal.  Labs reviewed and discussed with patient. Continue Arimidex,  Recommend annual mammogram. - October 2024 Mildly elevated alkaline phosphatase likely secondary to antibiotic use.
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
## 2022-10-17 NOTE — Assessment & Plan Note (Signed)
Patient is on Paxil.  Anxiety is worse and the patient requests Ativan prescription.  I discussed with patient's primary care provider via secure chat.  I will defer to primary provider to further evaluate patient and prescribe if indicated.

## 2022-11-01 ENCOUNTER — Encounter: Payer: Self-pay | Admitting: Family Medicine

## 2022-11-01 ENCOUNTER — Ambulatory Visit: Payer: Medicaid Other | Admitting: Family Medicine

## 2022-11-01 VITALS — BP 118/74 | HR 97 | Temp 97.9°F | Resp 18 | Ht 60.0 in | Wt 183.7 lb

## 2022-11-01 DIAGNOSIS — R7303 Prediabetes: Secondary | ICD-10-CM | POA: Diagnosis not present

## 2022-11-01 DIAGNOSIS — E785 Hyperlipidemia, unspecified: Secondary | ICD-10-CM

## 2022-11-01 DIAGNOSIS — F4323 Adjustment disorder with mixed anxiety and depressed mood: Secondary | ICD-10-CM | POA: Diagnosis not present

## 2022-11-01 DIAGNOSIS — L659 Nonscarring hair loss, unspecified: Secondary | ICD-10-CM | POA: Diagnosis not present

## 2022-11-01 MED ORDER — PAROXETINE HCL 30 MG PO TABS: 30.0000 mg | ORAL_TABLET | Freq: Every day | ORAL | 1 refills | Status: DC

## 2022-11-01 MED ORDER — BUSPIRONE HCL 10 MG PO TABS: 10.0000 mg | ORAL_TABLET | Freq: Three times a day (TID) | ORAL | 1 refills | Status: DC

## 2022-11-01 MED ORDER — KETOCONAZOLE 2 % EX SHAM: MEDICATED_SHAMPOO | CUTANEOUS | 3 refills | Status: DC

## 2022-11-01 MED ORDER — LORAZEPAM 0.5 MG PO TABS: ORAL_TABLET | ORAL | 0 refills | Status: AC

## 2022-11-01 NOTE — Progress Notes (Signed)
Name: Sheena Martinez   MRN: [id number]    DOB: 12/01/1960   Date:11/01/2022       Progress Note  Chief Complaint  Patient presents with   Follow-up   Medication Refill     Subjective:   Sheena Martinez is a 62 y.o. female, presents to clinic for routine f/up  A lot of health issues, infections, vision loss, new corneal transplant, facing rejection of the transplant and having blurry vision in other eye, a lot of stress and trauma, having.   Wants benzos - previously on them regularly/daily She is currently on paxil 20 mg, multiple meds tried over the past several decades - she reports many of them ineffective In the past on zoloft, effexor, buspar She feels worn down with her health and other issue in her family and at home    11/01/2022    1:59 PM 11/01/2022    1:58 PM 06/27/2022    1:19 PM  Depression screen PHQ 2/9  Decreased Interest 1 1 0  Down, Depressed, Hopeless 3 2 0  PHQ - 2 Score 4 3 0  Altered sleeping 1  0  Tired, decreased energy 1  0  Change in appetite 0  0  Feeling bad or failure about yourself  0  0  Trouble concentrating 0  0  Moving slowly or fidgety/restless 0  0  Suicidal thoughts 0  0  PHQ-9 Score 6  0  Difficult doing work/chores Not difficult at all  Not difficult at all      11/01/2022    2:04 PM 06/27/2022    2:35 PM 05/10/2021    1:34 PM 11/07/2020    2:45 PM  GAD 7 : Generalized Anxiety Score  Nervous, Anxious, on Edge 3 3 1 3   Control/stop worrying 2 3 1 3   Worry too much - different things 3 3 1 3   Trouble relaxing 2 3 1 3   Restless 0 3 0 2  Easily annoyed or irritable 0 0 1 0  Afraid - awful might happen 1 0 1 3  Total GAD 7 Score 11 15 6 17   Anxiety Difficulty Not difficult at all Very difficult Not difficult at all Extremely difficult  When asked what she hopes benzos will do for her - she explains -  In the ER previously she had all this stuff on her stressed and she asked for benzo in ED and it just made it all calm down and she  felt better.  Sheis not having panic attacks or ptsd. She has reached out to psychiatry, no appt for several months. She reports past therapy was unhelpful Husband with TBI stress with finances, bills, disability   Fair loss with antifungal medicine, her hirsitism has improved, but she's losing hair on her head/scalp Hx of PCOS = previously tried spironolactone and that also made her hair fall out on scalp She was recently on strong antfungal meds - doesn't know the name of them or SE - she was told her liver enzymes should improve after getting off medication Lab Results  Component Value Date   ALT 30 10/17/2022   AST 29 10/17/2022   ALKPHOS 136 (H) 10/17/2022   BILITOT 0.3 10/17/2022   Weight loss down 60 lbs Still working on [employer] Readings from Last 5 Encounters:  11/01/22 183 lb 11.2 oz (83.3 kg)  10/17/22 181 lb 12.8 oz (82.5 kg)  07/16/22 192 lb (87.1 kg)  06/27/22 198 lb 3.2 oz (89.9 kg)  04/18/22 205 lb 11.2 oz (93.3 kg)   BMI Readings from Last 5 Encounters:  11/01/22 35.88 kg/m  10/17/22 33.25 kg/m  07/16/22 35.12 kg/m  06/27/22 38.71 kg/m  04/18/22 40.17 kg/m   Last labs A1C was normal Lab Results  Component Value Date   HGBA1C 5.5 06/27/2022    HLD not on meds Lab Results  Component Value Date   CHOL 209 (H) 06/27/2022   HDL 43 (L) 06/27/2022   LDLCALC 136 (H) 06/27/2022   TRIG 167 (H) 06/27/2022   CHOLHDL 4.9 06/27/2022           Current Outpatient Medications:    acetaminophen (TYLENOL) 500 MG tablet, Take 1,000 mg by mouth every 8 (eight) hours as needed for mild pain or headache., Disp: , Rfl:    anastrozole (ARIMIDEX) 1 MG tablet, Take 1 tablet (1 mg total) by mouth daily., Disp: 90 tablet, Rfl: 1   Ca Carbonate-Mag Hydroxide (ROLAIDS PO), Take 1 tablet by mouth as needed (indigestion)., Disp: , Rfl:    Chlorhexidine Gluconate 2 % LIQD, Apply 15 mLs topically daily., Disp: 150 mL, Rfl: 0   cholecalciferol (VITAMIN D) 1000  units tablet, Take 2,000 Units by mouth daily., Disp: , Rfl:    diclofenac (VOLTAREN) 50 MG EC tablet, Take 1 tablet by mouth 2 (two) times daily with a meal., Disp: , Rfl:    diclofenac Sodium (VOLTAREN) 1 % GEL, Apply 2 g topically 4 (four) times daily., Disp: 100 g, Rfl: 3   ketoconazole (NIZORAL) 2 % cream, Apply 1 application topically daily., Disp: , Rfl:    ketoconazole (NIZORAL) 2 % shampoo, SHAMPOO   TOPICALLY TO AFFECTED AREA TWICE A WEEK, Disp: 120 mL, Rfl: 0   Light Mineral Oil-Mineral Oil 0.5-0.5 % EMUL, Apply to eye., Disp: , Rfl:    natamycin (NATACYN) 5 % ophthalmic suspension, Place 1 drop into the left eye every 2 (two) hours while awake., Disp: , Rfl:    NON FORMULARY, voriconazole (VFEND) ophthalmic solution 1 Administer 1 drop into the left eye every hour for 14 days. Discard after 7 days and refill for full 14 days., Disp: , Rfl:    PARoxetine (PAXIL) 20 MG tablet, Take 1 tablet by mouth once daily, Disp: 90 tablet, Rfl: 0   prednisoLONE acetate (PRED FORTE) 1 % ophthalmic suspension, , Disp: , Rfl:    vitamin k 100 MCG tablet, Take 100 mcg by mouth 2 (two) times a week., Disp: , Rfl:    Water For Injection Sterile (STERILE WATER, PRESERVATIVE FREE,) injection, , Disp: , Rfl:  No current facility-administered medications for this visit.  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   Combined form of senile cataract of right eye 02/11/2020   Cataract 12/14/2019   Post-menopausal bleeding 10/25/2019   Osteopenia 03/18/2019   Facial flushing 10/21/2018   Tinnitus of both ears 10/21/2018   Thrombocytosis 10/20/2018   Toxic multinodular goiter 06/29/2018   Irregular menstrual cycle 03/02/2018   Screening for cervical cancer 03/02/2018    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   Anxiety 10/03/2017   Bilateral primary osteoarthritis of knee 10/03/2017   Peripheral ulcerative keratitis 10/01/2017   Malignant neoplasm of upper-inner quadrant of left breast in female, estrogen receptor positive ([place]) 05/22/2017  Hirsutism 11/24/2015   Hyperlipidemia 02/01/2014   Vitamin D deficiency 02/01/2014   Family history of diabetes mellitus 01/31/2014   Sjogren's syndrome ([place]) 01/17/2014    Past Surgical History:  Procedure Laterality Date   BREAST BIOPSY Left 05/12/2017   [place] bx/ INVASIVE MAMMARY CARCINOMA   BREAST LUMPECTOMY Left 05/2017   invasive mammary carcinoma, neg margins   BREAST LUMPECTOMY WITH SENTINEL LYMPH NODE BIOPSY Left 06/10/2017   Procedure: BREAST LUMPECTOMY WITH SENTINEL LYMPH NODE BX AND NEEDLE LOCALIZATION;  Surgeon: Robert Bellow, MD;  Location: [facility];  Service: General;  Laterality: Left;   BREAST MAMMOSITE Left 06/24/2017   Procedure: MAMMOSITE BREAST;  Surgeon: Robert Bellow, MD;  Location: [facility];  Service: General;  Laterality: Left;   [place]  11/2017   [facility]   COSMETIC SURGERY     CYST EXCISION  2018   pilar cyst/ Dr Will Bonnet   EYE SURGERY     PORTACATH PLACEMENT Right 07/08/2017   Procedure: INSERTION PORT-A-CATH;  Surgeon: Robert Bellow, MD;  Location: [facility];  Service: General;  Laterality: Right;   TONSILLECTOMY      Family History  Problem Relation Age of Onset   Breast cancer Maternal Aunt 70       currently ~65   Diabetes Mother    Skin cancer Mother        currently 54; TAH/BSO (age?)   Anemia Mother    Liver disease Father        deceased / not much info about him / alcoholic   Lung cancer Paternal Aunt        smoker / deceased 59s   Stomach cancer Paternal Uncle 33       deceased / deceased 56s   Ovarian cancer Maternal Grandmother 92       deceased 50s   Stomach cancer Paternal Grandfather 65       deceased 51s    Cancer Maternal Uncle        unk. type; deceased 22s   Leukemia Cousin 11       deceased 69    Social History   Tobacco Use   Smoking status: Former    Years: 7    Types: Cigarettes    Quit date: 08/13/1979    Years since quitting: 43.2   Smokeless tobacco: Never   Tobacco comments:    AGE [age]  Vaping Use   Vaping Use: Never used  Substance Use Topics   Alcohol use: No   Drug use: No     Allergies  Allergen Reactions   Rituximab Anaphylaxis   Benadryl [Diphenhydramine] Other (See Comments)    Patient felt like she was going to "crawl out of her skin"   Clonazepam Anxiety and Other (See Comments)   Cortisone Other (See Comments)    Also reacts to cortisone injections; they make her flushed   Diphenhydramine Hcl    Sulfamethoxazole-Trimethoprim Rash    Chest tightness. Bactrim    Health Maintenance  Topic Date Due   INFLUENZA VACCINE  11/10/2022 (Originally 03/12/2022)   COVID-19 Vaccine (1) 11/17/2022 (Originally 02/01/1966)   Zoster Vaccines- Shingrix (1 of 2) 02/01/2023 (Originally 02/02/1980)   MAMMOGRAM  05/23/2023   DEXA SCAN  06/20/2023   PAP SMEAR-Modifier  09/12/2023   COLONOSCOPY (Pts 45-16yrs Insurance coverage will need to be confirmed)  12/14/2027   DTaP/Tdap/Td (2 - Td or Tdap) 07/21/2028   Hepatitis C Screening  Completed  HIV Screening  Completed   HPV VACCINES  Aged Out    Chart Review Today: I personally reviewed active problem list, medication list, allergies, family history, social history, health maintenance, notes from last encounter, lab results, imaging with the patient/caregiver today.   Review of Systems  Constitutional: Negative.   HENT: Negative.    Eyes: Negative.   Respiratory: Negative.    Cardiovascular: Negative.   Gastrointestinal: Negative.   Endocrine: Negative.   Genitourinary: Negative.   Musculoskeletal: Negative.   Skin: Negative.   Allergic/Immunologic: Negative.   Neurological: Negative.   Hematological:  Negative.   Psychiatric/Behavioral: Negative.    All other systems reviewed and are negative.    Objective:   Vitals:   11/01/22 1355  BP: 118/74  Pulse: 97  Resp: 18  Temp: 97.9 F (36.6 C)  SpO2: 99%  Weight: 183 lb 11.2 oz (83.3 kg)  Height: 5' (1.524 m)    Body mass index is 35.88 kg/m.  Physical Exam Vitals and nursing note reviewed.  Constitutional:      General: She is not in acute distress.    Appearance: She is well-developed. She is obese. She is not ill-appearing, toxic-appearing or diaphoretic.  HENT:     Head: Normocephalic and atraumatic.     Nose: Nose normal.  Neck:     Trachea: No tracheal deviation.  Cardiovascular:     Rate and Rhythm: Normal rate and regular rhythm.     Pulses: Normal pulses.     Heart sounds: Normal heart sounds. No murmur heard.    No friction rub. No gallop.  Pulmonary:     Effort: Pulmonary effort is normal. No respiratory distress.     Breath sounds: Normal breath sounds. No stridor.  Skin:    General: Skin is warm and dry.     Findings: No rash.  Neurological:     Mental Status: She is alert.     Motor: No abnormal muscle tone.     Coordination: Coordination normal.  Psychiatric:        Behavior: Behavior normal.         Assessment & Plan:        ICD-10-CM   1. Hyperlipidemia, unspecified hyperlipidemia type  [id number] COMPLETE METABOLIC PANEL WITH GFR   not on meds, lipids very high    2. Prediabetes  [id number] COMPLETE METABOLIC PANEL WITH GFR   last labs improved, only 4 months later - can check q 6-12 months    3. Alopecia  L65.9 TSH    CBC with Differential/Platelet    Iron, TIBC and Ferritin Panel   hx of hair loss in the past, last tsh was normal, hair loss to body and scalp gradual - labs to eval thyroid/iron, may need derm She can try topical OTC rogaine    4. Adjustment disorder with mixed anxiety and depressed mood  F43.23 TSH     see below     PHQ9 and GAD 7 positive - pt wants benzos for  symptoms, but not having anxiety/panic attacks, just persistently overwhelmed and stressed and frustrated with multiple stressors. She reports multiple prior meds that didn't work, she specifically states she does not want to increase paxil dose - but dose increase of current med and psych/therapy was recommended to her today as best tx plan. She states therapy was not helpful in the past - she was urged to try again. She called psych but did not make an appt, she was encouraged to make an  appt even if its a few months wait. She has significant stressors and continued chronic health issues as well as other family and financial stress that are not going away so I explained standard of care for anxiety/depression/adjustment disorder - SSRIs may help some, other therapies and treatment with ssri will likely help more I explained I do not prescribe benzos for daily use and only very rarely with multiple other tx set up will I sometimes rx a few benzos for severe sx/rescue - usually for panic attacks or medical procedures - and only #10 to last several months.  No refills given w/o f/up appt and proof of pt working on plan as outlined.  Risk and SE of benzos reviewed at length.  Paxil dose sent in slightly higher Other tx and coping skills reviewed and recommended - meditations, exercise, talk therapy, CBT, support groups. Pt encouraged to try safer anxiety meds again first - like buspar 10 mg BID to TID for 10 + days to see if she notices a benefit. Hydroxyzine may not be a good choice with her sjogrens - avoid anticholinergics   Paxil dose increased Buspar sent in Ativan 0.5 mg dose sent in for sparingly use - not for daily use, RX must last more than 3 months, no refills      Return in about 6 months (around 05/04/2023).   Delsa Grana, PA-C 11/01/22 2:32 PM

## 2022-11-02 LAB — IRON,TIBC AND FERRITIN PANEL
%SAT: 36 % (calc) (ref 16–45)
Ferritin: 115 ng/mL (ref 16–288)
Iron: 105 ug/dL (ref 45–160)
TIBC: 289 mcg/dL (calc) (ref 250–450)

## 2022-11-02 LAB — COMPLETE METABOLIC PANEL WITH GFR
AG Ratio: 1.5 (calc) (ref 1.0–2.5)
ALT: 18 U/L (ref 6–29)
AST: 17 U/L (ref 10–35)
Albumin: 4.1 g/dL (ref 3.6–5.1)
Alkaline phosphatase (APISO): 120 U/L (ref 37–153)
BUN: 17 mg/dL (ref 7–25)
CO2: 28 mmol/L (ref 20–32)
Calcium: 9.6 mg/dL (ref 8.6–10.4)
Chloride: 105 mmol/L (ref 98–110)
Creat: 0.67 mg/dL (ref 0.50–1.05)
Globulin: 2.7 g/dL (calc) (ref 1.9–3.7)
Glucose, Bld: 81 mg/dL (ref 65–99)
Potassium: 4.6 mmol/L (ref 3.5–5.3)
Sodium: 143 mmol/L (ref 135–146)
Total Bilirubin: 0.4 mg/dL (ref 0.2–1.2)
Total Protein: 6.8 g/dL (ref 6.1–8.1)
eGFR: 99 mL/min/{1.73_m2} (ref >=60)

## 2022-11-02 LAB — CBC WITH DIFFERENTIAL/PLATELET
Absolute Monocytes: 557 cells/uL (ref 200–950)
Basophils Absolute: 21 cells/uL (ref 0–200)
Basophils Relative: 0.4 %
Eosinophils Absolute: 69 cells/uL (ref 15–500)
Eosinophils Relative: 1.3 %
HCT: 39.9 % (ref 35.0–45.0)
Hemoglobin: 13.3 g/dL (ref 11.7–15.5)
Lymphs Abs: 2014 cells/uL (ref 850–3900)
MCH: 31.1 pg (ref 27.0–33.0)
MCHC: 33.3 g/dL (ref 32.0–36.0)
MCV: 93.2 fL (ref 80.0–100.0)
MPV: 9.9 fL (ref 7.5–12.5)
Monocytes Relative: 10.5 %
Neutro Abs: 2639 cells/uL (ref 1500–7800)
Neutrophils Relative %: 49.8 %
Platelets: 346 10*3/uL (ref 140–400)
RBC: 4.28 10*6/uL (ref 3.80–5.10)
RDW: 12.9 % (ref 11.0–15.0)
Total Lymphocyte: 38 %
WBC: 5.3 10*3/uL (ref 3.8–10.8)

## 2022-11-02 LAB — TSH: TSH: 2.86 mIU/L (ref 0.40–4.50)

## 2022-11-05 DIAGNOSIS — F411 Generalized anxiety disorder: Secondary | ICD-10-CM | POA: Diagnosis not present

## 2022-11-06 NOTE — Addendum Note (Signed)
Addended by: Delsa Grana on: 11/06/2022 05:35 PM   Modules accepted: Orders

## 2022-12-03 DIAGNOSIS — F411 Generalized anxiety disorder: Secondary | ICD-10-CM | POA: Diagnosis not present

## 2022-12-23 ENCOUNTER — Telehealth: Payer: Self-pay

## 2022-12-23 NOTE — Telephone Encounter (Signed)
Patient states she has some question in regards to her anastrozole.

## 2022-12-24 NOTE — Telephone Encounter (Signed)
Per Dr. Cathie Hoops, pt may hold off for 4 weeks , if no improvement she may resume. Pt informed and verbalized understanding.

## 2022-12-24 NOTE — Telephone Encounter (Signed)
Called pt back. Pt sates that she has been having blurry vision to right eye (new onset). She see's a cornea specialist and was told that Anastrazole might be the culprit. She would like to know if it would be ok to hold temporarrily to see if side effects improve.

## 2023-01-14 DIAGNOSIS — L650 Telogen effluvium: Secondary | ICD-10-CM | POA: Diagnosis not present

## 2023-01-14 DIAGNOSIS — L649 Androgenic alopecia, unspecified: Secondary | ICD-10-CM | POA: Diagnosis not present

## 2023-01-14 DIAGNOSIS — L659 Nonscarring hair loss, unspecified: Secondary | ICD-10-CM | POA: Diagnosis not present

## 2023-01-21 DIAGNOSIS — T868409 Corneal transplant rejection, unspecified eye: Secondary | ICD-10-CM | POA: Diagnosis not present

## 2023-02-04 DIAGNOSIS — H16012 Central corneal ulcer, left eye: Secondary | ICD-10-CM | POA: Diagnosis not present

## 2023-02-04 DIAGNOSIS — H16002 Unspecified corneal ulcer, left eye: Secondary | ICD-10-CM | POA: Diagnosis not present

## 2023-02-04 DIAGNOSIS — T868409 Corneal transplant rejection, unspecified eye: Secondary | ICD-10-CM | POA: Diagnosis not present

## 2023-02-06 ENCOUNTER — Other Ambulatory Visit: Payer: Self-pay | Admitting: Nurse Practitioner

## 2023-02-06 ENCOUNTER — Telehealth: Payer: Self-pay | Admitting: Family Medicine

## 2023-02-06 DIAGNOSIS — H16009 Unspecified corneal ulcer, unspecified eye: Secondary | ICD-10-CM

## 2023-02-06 DIAGNOSIS — T868409 Corneal transplant rejection, unspecified eye: Secondary | ICD-10-CM

## 2023-02-06 NOTE — Telephone Encounter (Signed)
Pt aware It can take up to 1-2 weeks for it to be processed.

## 2023-02-06 NOTE — Telephone Encounter (Signed)
Pt seen Leisa on 11/01/22. Only documentation that was noted the day of visits was:  A lot of health issues, infections, vision loss, new corneal transplant, facing rejection of the transplant and having blurry vision in other eye, a lot of stress and trauma, having.   Can be referral be placed or she needs an appointment?

## 2023-02-06 NOTE — Telephone Encounter (Signed)
Copied from CRM [id number]. Topic: Referral - Request for Referral >> Feb 06, 2023 11:39 AM Epimenio Foot F wrote: Has patient seen PCP for this complaint? Yes.   *If NO, is insurance requiring patient see PCP for this issue before PCP can refer them? Referral for which specialty: Eye Care  Preferred provider/office: [facility]  Reason for referral: Issues with eyes   Fax number:  [phone] , pt is requesting a callback once the referral has been sent.

## 2023-02-06 NOTE — Telephone Encounter (Signed)
Pt states specialist she sees is at [facility] she was trying just to get a protective contact lens (local) which she did not want to travel that way. If you can please put referral in.

## 2023-02-18 DIAGNOSIS — F411 Generalized anxiety disorder: Secondary | ICD-10-CM | POA: Diagnosis not present

## 2023-02-20 DIAGNOSIS — H16002 Unspecified corneal ulcer, left eye: Secondary | ICD-10-CM | POA: Diagnosis not present

## 2023-02-20 DIAGNOSIS — T868409 Corneal transplant rejection, unspecified eye: Secondary | ICD-10-CM | POA: Diagnosis not present

## 2023-03-07 ENCOUNTER — Telehealth: Payer: Self-pay | Admitting: Family Medicine

## 2023-03-07 ENCOUNTER — Encounter: Payer: Self-pay | Admitting: Family Medicine

## 2023-03-07 NOTE — Telephone Encounter (Signed)
Medication Refill - Medication: Moxifloxacin 0.5% eye drops   Pt called to report that [facility] in [place] is temporarily closed. They usually prescribe this. They have water damage at the clinic. Pt wants to know if PCP would be willing to fill this.    Has the patient contacted their pharmacy? Yes.   (Agent: If no, request that the patient contact the pharmacy for the refill. If patient does not wish to contact the pharmacy document the reason why and proceed with request.) (Agent: If yes, when and what did the pharmacy advise?)  Preferred Pharmacy (with phone number or street name):  [facility] [address], [place] - [address]  [address] [place] [address]  Phone: [phone] Fax: [phone]  Hours: Not open 24 hours   Has the patient been seen for an appointment in the last year OR does the patient have an upcoming appointment? Yes.    Agent: Please be advised that RX refills may take up to 3 business days. We ask that you follow-up with your pharmacy.

## 2023-03-07 NOTE — Telephone Encounter (Signed)
Request has been reviewed and responded to by office in MyChart: Alba Cory, MD  to TARONDA KOKES      03/07/23 12:47 PM That is an antibiotics , we do not fill that You can go to urgent care if needed I am sorry   Last read by Carmie Kanner at 12:51 PM on 03/07/2023.

## 2023-03-10 DIAGNOSIS — Z7960 Long term (current) use of unspecified immunomodulators and immunosuppressants: Secondary | ICD-10-CM | POA: Diagnosis not present

## 2023-03-10 DIAGNOSIS — H16002 Unspecified corneal ulcer, left eye: Secondary | ICD-10-CM | POA: Diagnosis not present

## 2023-03-10 DIAGNOSIS — M3501 Sicca syndrome with keratoconjunctivitis: Secondary | ICD-10-CM | POA: Diagnosis not present

## 2023-03-21 DIAGNOSIS — N3001 Acute cystitis with hematuria: Secondary | ICD-10-CM | POA: Diagnosis not present

## 2023-04-12 DIAGNOSIS — H16072 Perforated corneal ulcer, left eye: Secondary | ICD-10-CM | POA: Diagnosis not present

## 2023-05-08 ENCOUNTER — Ambulatory Visit: Payer: Medicaid Other | Admitting: Nurse Practitioner

## 2023-05-08 DIAGNOSIS — B49 Unspecified mycosis: Secondary | ICD-10-CM | POA: Diagnosis not present

## 2023-05-08 DIAGNOSIS — H169 Unspecified keratitis: Secondary | ICD-10-CM | POA: Diagnosis not present

## 2023-05-08 DIAGNOSIS — H168 Other keratitis: Secondary | ICD-10-CM | POA: Diagnosis not present

## 2023-05-13 DIAGNOSIS — M3501 Sicca syndrome with keratoconjunctivitis: Secondary | ICD-10-CM | POA: Diagnosis not present

## 2023-05-13 DIAGNOSIS — H169 Unspecified keratitis: Secondary | ICD-10-CM | POA: Diagnosis not present

## 2023-05-13 DIAGNOSIS — F411 Generalized anxiety disorder: Secondary | ICD-10-CM | POA: Diagnosis not present

## 2023-05-13 DIAGNOSIS — M170 Bilateral primary osteoarthritis of knee: Secondary | ICD-10-CM | POA: Diagnosis not present

## 2023-05-14 DIAGNOSIS — B470 Eumycetoma: Secondary | ICD-10-CM | POA: Diagnosis not present

## 2023-05-28 ENCOUNTER — Ambulatory Visit
Admission: RE | Admit: 2023-05-28 | Discharge: 2023-05-28 | Disposition: A | Discharge status: Home / Self Care | Payer: Medicaid Other | Source: Ambulatory Visit | Attending: Oncology | Admitting: Oncology

## 2023-05-28 DIAGNOSIS — C50212 Malignant neoplasm of upper-inner quadrant of left female breast: Secondary | ICD-10-CM | POA: Diagnosis not present

## 2023-05-28 DIAGNOSIS — Z170 Estrogen receptor positive status [ER+]: Secondary | ICD-10-CM | POA: Insufficient documentation

## 2023-05-28 DIAGNOSIS — Z1231 Encounter for screening mammogram for malignant neoplasm of breast: Secondary | ICD-10-CM | POA: Insufficient documentation

## 2023-05-29 DIAGNOSIS — B49 Unspecified mycosis: Secondary | ICD-10-CM | POA: Diagnosis not present

## 2023-05-29 DIAGNOSIS — H168 Other keratitis: Secondary | ICD-10-CM | POA: Diagnosis not present

## 2023-06-09 ENCOUNTER — Encounter: Payer: Self-pay | Admitting: Physician Assistant

## 2023-06-09 ENCOUNTER — Ambulatory Visit: Payer: Medicaid Other | Admitting: Physician Assistant

## 2023-06-09 VITALS — BP 112/76 | HR 88 | Temp 98.4°F | Resp 16 | Ht 60.0 in | Wt 174.8 lb

## 2023-06-09 DIAGNOSIS — R1011 Right upper quadrant pain: Secondary | ICD-10-CM | POA: Diagnosis not present

## 2023-06-09 DIAGNOSIS — R110 Nausea: Secondary | ICD-10-CM

## 2023-06-09 DIAGNOSIS — K8020 Calculus of gallbladder without cholecystitis without obstruction: Secondary | ICD-10-CM

## 2023-06-09 MED ORDER — ONDANSETRON HCL 4 MG PO TABS: 4.0000 mg | ORAL_TABLET | Freq: Three times a day (TID) | ORAL | 0 refills | Status: DC | PRN

## 2023-06-09 NOTE — Patient Instructions (Addendum)
At this time I recommend trying to take Pepcid daily for the next 2-3 weeks and Pepto to help with the gas and bloating.  I have placed an order for an ultrasound of your liver and gallbladder   You should be contacted in the next few days to set this up

## 2023-06-09 NOTE — Progress Notes (Signed)
Acute Office Visit   Patient: Sheena Martinez   DOB: 02/02/1961   62 y.o. Female  MRN: [id number] Visit Date: 06/09/2023  Today's healthcare provider: Oswaldo Conroy Estha Few, PA-C  Introduced myself to the patient as a Secondary school teacher and provided education on APPs in clinical practice.    Chief Complaint  Patient presents with   Abdominal Pain    Center area, every time pt eats she gets cramping, onset for a week   Nausea   Diarrhea    Loose stools, more frequent   Subjective    HPI HPI     Abdominal Pain    Additional comments: Center area, every time pt eats she gets cramping, onset for a week        Diarrhea    Additional comments: Loose stools, more frequent      Last edited by Forde Radon, CMA on 06/09/2023  3:07 PM.     Abdominal Pain  She reports ongoing nausea that is constant She states when she eats something she has pain and gas. Today she also had loose stools, not liquid but had several bouts of bowel movements which is not her normal  Onset: sudden  Duration: ongoing for a week  She reports her symptoms do not seem to correlate with type of foods ingested, She reports she has been eating same things every day, salads for dinner. She reports eating the same things for breakfast and lunch as well- denies significant changes  Location: epigastric discomfort and nausea  Radiation: none  Pain level and character: She reports it is around a 2/10 but persistent and makes her feel sick to do things in her normal day  Other associated symptoms: She reports the pain typically starts about an hour after eating. Interventions: she has tried eliminating her humus and nuts but this has not changed her symptoms, Tried eating pizza last night but this did not improve symptoms either   Alleviating: nothing  Aggravating: unsure    She has stopped taking her Voriconazole as her above symptoms were listed as top side effects  She reports that she recently had  LFTs due to antifungal use (late September) which were normal.    Medications: Outpatient Medications Prior to Visit  Medication Sig   acetaminophen (TYLENOL) 500 MG tablet Take 1,000 mg by mouth every 8 (eight) hours as needed for mild pain or headache.   anastrozole (ARIMIDEX) 1 MG tablet Take 1 tablet (1 mg total) by mouth daily.   busPIRone (BUSPAR) 10 MG tablet Take 1 tablet (10 mg total) by mouth 3 (three) times daily.   Ca Carbonate-Mag Hydroxide (ROLAIDS PO) Take 1 tablet by mouth as needed (indigestion).   Chlorhexidine Gluconate 2 % LIQD Apply 15 mLs topically daily.   cholecalciferol (VITAMIN D) 1000 units tablet Take 2,000 Units by mouth daily.   diclofenac (VOLTAREN) 50 MG EC tablet Take 1 tablet by mouth 2 (two) times daily with a meal.   diclofenac Sodium (VOLTAREN) 1 % GEL Apply 2 g topically 4 (four) times daily.   ketoconazole (NIZORAL) 2 % cream Apply 1 application topically daily.   ketoconazole (NIZORAL) 2 % shampoo SHAMPOO   TOPICALLY TO AFFECTED AREA TWICE A WEEK   Light Mineral Oil-Mineral Oil 0.5-0.5 % EMUL Apply to eye.   LORazepam (ATIVAN) 0.5 MG tablet May take 0.5-1 mg (1 to 2 tablets) po daily prn for severe stress/anxiety symptoms, use sparingly, not for daily use, rx  to last more than 3 months, no refills   loteprednol (LOTEMAX) 0.5 % ophthalmic suspension SMARTSIG:In Eye(s)   moxifloxacin (VIGAMOX) 0.5 % ophthalmic solution Place 1 drop into the left eye 4 (four) times daily.   natamycin (NATACYN) 5 % ophthalmic suspension Place 1 drop into the left eye every 2 (two) hours while awake.   NON FORMULARY voriconazole (VFEND) ophthalmic solution 1 Administer 1 drop into the left eye every hour for 14 days. Discard after 7 days and refill for full 14 days.   ofloxacin (OCUFLOX) 0.3 % ophthalmic solution Place 1 drop into the right eye 4 (four) times daily.   PARoxetine (PAXIL) 30 MG tablet Take 1 tablet (30 mg total) by mouth daily.   prednisoLONE acetate (PRED  FORTE) 1 % ophthalmic suspension    vitamin k 100 MCG tablet Take 100 mcg by mouth 2 (two) times a week.   Water For Injection Sterile (STERILE WATER, PRESERVATIVE FREE,) injection    voriconazole (VFEND) 200 MG tablet Take 200 mg by mouth 2 (two) times daily. (Patient not taking: Reported on 06/09/2023)   Facility-Administered Medications Prior to Visit  Medication Dose Route Frequency Provider   goserelin (ZOLADEX) injection 3.6 mg  3.6 mg Subcutaneous Once Rickard Patience, MD    Review of Systems  Constitutional:  Negative for chills, diaphoresis and fever.  Gastrointestinal:  Positive for abdominal pain, diarrhea and nausea. Negative for abdominal distention, blood in stool, constipation and vomiting.  Neurological:  Negative for dizziness and headaches.        Objective    BP 112/76   Pulse 88   Temp 98.4 F (36.9 C) (Oral)   Resp 16   Ht 5' (1.524 m)   Wt 174 lb 12.8 oz (79.3 kg)   SpO2 98%   BMI 34.14 kg/m     Physical Exam Vitals reviewed.  Constitutional:      General: She is awake.     Appearance: Normal appearance. She is well-developed and well-groomed.  HENT:     Head: Normocephalic and atraumatic.  Cardiovascular:     Rate and Rhythm: Normal rate and regular rhythm.     Heart sounds: Normal heart sounds.  Pulmonary:     Effort: Pulmonary effort is normal.     Breath sounds: Normal breath sounds.  Abdominal:     General: Abdomen is flat. Bowel sounds are normal.     Palpations: Abdomen is soft.     Tenderness: There is abdominal tenderness in the right upper quadrant. Positive signs include Murphy's sign.  Musculoskeletal:     Cervical back: Normal range of motion and neck supple.     Right lower leg: No edema.     Left lower leg: No edema.  Skin:    General: Skin is warm and dry.  Neurological:     General: No focal deficit present.     Mental Status: She is alert and oriented to person, place, and time.     GCS: GCS eye subscore is 4. GCS verbal  subscore is 5. GCS motor subscore is 6.     Cranial Nerves: No dysarthria or facial asymmetry.  Psychiatric:        Attention and Perception: Attention and perception normal.        Mood and Affect: Mood and affect normal.        Speech: Speech normal.        Behavior: Behavior normal. Behavior is cooperative.        Thought  Content: Thought content normal.        Cognition and Memory: Cognition normal.       No results found for any visits on 06/09/23.  Assessment & Plan      No follow-ups on file.       Problem List Items Addressed This Visit   None Visit Diagnoses     RUQ abdominal pain    -  Primary Acute new concern, ongoing  Patient reports ongoing epigastric and RUQ abdominal pain for the past week that is not improving despite changes to diet. She has a positive Murphy sign on exam which is concerning for potential gallbladder etiology especially given her HPI Differential includes but is not limited to: cholelithiasis, gastritis, GERD, medication side effects, peptic ulcer Will get ultrasound of right upper quadrant to assist with rule out Recommend that she starts Zofran as needed for nausea.  Also recommend starting Pepcid and Pepto-Bismol as needed to assist with symptoms further We reviewed ED and return precautions Follow-up as needed for progressing or persistent symptoms   Relevant Medications   ondansetron (ZOFRAN) 4 MG tablet   Other Relevant Orders   US Abdomen Limited RUQ (LIVER/GB)   Nausea       Relevant Medications   ondansetron (ZOFRAN) 4 MG tablet        No follow-ups on file.   I, Natasja Niday E Marley Charlot, PA-C, have reviewed all documentation for this visit. The documentation on 06/09/23 for the exam, diagnosis, procedures, and orders are all accurate and complete.   Jacquelin Hawking, MHS, PA-C [facility]

## 2023-06-11 ENCOUNTER — Encounter: Payer: Self-pay | Admitting: Oncology

## 2023-06-11 ENCOUNTER — Ambulatory Visit
Admission: RE | Admit: 2023-06-11 | Discharge: 2023-06-11 | Disposition: A | Discharge status: Home / Self Care | Payer: Medicaid Other | Source: Ambulatory Visit | Attending: Physician Assistant | Admitting: Physician Assistant

## 2023-06-11 DIAGNOSIS — R1011 Right upper quadrant pain: Secondary | ICD-10-CM

## 2023-06-11 DIAGNOSIS — K828 Other specified diseases of gallbladder: Secondary | ICD-10-CM | POA: Diagnosis not present

## 2023-06-11 DIAGNOSIS — R110 Nausea: Secondary | ICD-10-CM | POA: Diagnosis not present

## 2023-06-11 DIAGNOSIS — R1013 Epigastric pain: Secondary | ICD-10-CM | POA: Diagnosis not present

## 2023-06-11 DIAGNOSIS — K8020 Calculus of gallbladder without cholecystitis without obstruction: Secondary | ICD-10-CM | POA: Diagnosis not present

## 2023-06-11 NOTE — Addendum Note (Signed)
Addended by: Jacquelin Hawking on: 06/11/2023 12:46 PM   Modules accepted: Orders

## 2023-06-11 NOTE — Progress Notes (Signed)
Your ultrasound is back There was evidence of numerous stones in your gallbladder.  However it does not appear that there were any in the bile duct causing obstruction or backup.  Your gallbladder was mildly distended which means that it was probably a little irritated.  These findings are consistent with what we were discussing at your appointment-gallstones moving in front of the the outlet for the gallbladder but not causing direct obstruction or getting stuck.  Since you are having symptoms when you eat I have placed a referral to general surgery for you to discuss potential surgical intervention.  If you start having more severe pain, fever, chills, nausea, vomiting, inability to eat or drink I do recommend going to the emergency room for prompt evaluation as this could be signs of an inflamed/infected gallbladder.  Please let us know if you have further questions or concerns

## 2023-06-12 ENCOUNTER — Telehealth: Payer: Self-pay | Admitting: General Surgery

## 2023-06-12 ENCOUNTER — Ambulatory Visit: Payer: Medicaid Other | Admitting: General Surgery

## 2023-06-12 ENCOUNTER — Encounter: Payer: Self-pay | Admitting: General Surgery

## 2023-06-12 ENCOUNTER — Ambulatory Visit: Payer: Self-pay | Admitting: General Surgery

## 2023-06-12 VITALS — BP 119/67 | HR 67 | Temp 98.5°F | Ht 60.0 in | Wt 173.8 lb

## 2023-06-12 DIAGNOSIS — K8020 Calculus of gallbladder without cholecystitis without obstruction: Secondary | ICD-10-CM | POA: Diagnosis not present

## 2023-06-12 NOTE — Telephone Encounter (Signed)
Left message for patient to call, please inform her of the following regarding scheduled surgery with Dr. Maurine Minister.   Pre-Admission date/time, and Surgery date at [facility].  Surgery Date: 06/20/23 Preadmission Testing Date: 06/17/23 (phone 8a-1p)  Also patient will need to call at [phone], between 1-3:00pm the day before surgery, to find out what time to arrive for surgery.

## 2023-06-12 NOTE — Progress Notes (Signed)
Patient ID: Sheena Martinez, female   DOB: May 02, 1961, 62 y.o.   MRN: [id number] CC: RUQ pain History of Present Illness Sheena Martinez is a 62 y.o. female who presents evaluation for symptomatic cholelithiasis.  The patient reports that over the last 2 weeks she has had postprandial epigastric pain.  This has happened with every meal.  She says the pain last for a couple of hours and then goes away.  She has been nauseated but denies any vomiting, fevers or chills.  She had an ultrasound that showed symptomatic cholelithiasis.  Of note the patient is on anastrozole due to history of breast cancer.  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   Overview:  Added automatically from request for surgery [id number]   GERD (gastroesophageal reflux disease)    OCC   Goiter    Hematuria 10/21/2018   Hyperthyroidism    Personal history of radiation therapy 2018   LEFT lumpectomy   Sjogren's syndrome (HCC)    Uses continuous positive airway pressure (CPAP) ventilation at home 04/30/2019       Past Surgical History:  Procedure Laterality Date   BREAST BIOPSY Left 05/12/2017   [place] bx/ INVASIVE MAMMARY CARCINOMA   BREAST LUMPECTOMY Left 05/2017   invasive mammary carcinoma, neg margins   BREAST LUMPECTOMY WITH SENTINEL LYMPH NODE BIOPSY Left 06/10/2017   Procedure: BREAST LUMPECTOMY WITH SENTINEL LYMPH NODE BX AND NEEDLE LOCALIZATION;  Surgeon: Earline Mayotte, MD;  Location: [facility] ORS;  Service: General;  Laterality: Left;   BREAST MAMMOSITE Left 06/24/2017   Procedure: MAMMOSITE BREAST;  Surgeon: Earline Mayotte, MD;  Location: [facility] ORS;  Service: General;  Laterality: Left;   CESAREAN SECTION  1995   CORNEAL TRANSPLANT  11/2017   [facility]   COSMETIC SURGERY     CYST EXCISION   2018   pilar cyst/ Dr Orma Flaming   EYE SURGERY     PORTACATH PLACEMENT Right 07/08/2017   Procedure: INSERTION PORT-A-CATH;  Surgeon: Earline Mayotte, MD;  Location: [facility] ORS;  Service: General;  Laterality: Right;   TONSILLECTOMY      Allergies  Allergen Reactions   Rituximab Anaphylaxis   Benadryl [Diphenhydramine] Other (See Comments)    Patient felt like she was going to "crawl out of her skin"   Clonazepam Anxiety and Other (See Comments)   Cortisone Other (See Comments)    Also reacts to cortisone injections; they make her flushed   Diphenhydramine Hcl    Sulfamethoxazole-Trimethoprim Rash    Chest tightness. Bactrim    Current Outpatient Medications  Medication Sig Dispense Refill   acetaminophen (TYLENOL) 500 MG tablet Take 1,000 mg by mouth every 8 (eight) hours as needed for mild pain or headache.     anastrozole (ARIMIDEX) 1 MG tablet Take 1 tablet (1 mg total) by mouth daily. 90 tablet 1   busPIRone (BUSPAR) 10 MG tablet Take 1 tablet (10 mg total) by mouth 3 (three) times daily. 90 tablet 1   Ca Carbonate-Mag Hydroxide (ROLAIDS PO) Take 1 tablet by mouth as needed (indigestion).     Chlorhexidine Gluconate 2 % LIQD Apply 15 mLs topically daily. 150 mL 0   cholecalciferol (VITAMIN D) 1000 units tablet Take 2,000 Units by mouth daily.  diclofenac (VOLTAREN) 50 MG EC tablet Take 1 tablet by mouth 2 (two) times daily with a meal.     diclofenac Sodium (VOLTAREN) 1 % GEL Apply 2 g topically 4 (four) times daily. 100 g 3   ketoconazole (NIZORAL) 2 % cream Apply 1 application topically daily.     Light Mineral Oil-Mineral Oil 0.5-0.5 % EMUL Apply to eye.     LORazepam (ATIVAN) 0.5 MG tablet May take 0.5-1 mg (1 to 2 tablets) po daily prn for severe stress/anxiety symptoms, use sparingly, not for daily use, rx to last more than 3 months, no refills 20 tablet 0   loteprednol (LOTEMAX) 0.5 % ophthalmic suspension SMARTSIG:In Eye(s)     moxifloxacin (VIGAMOX) 0.5 % ophthalmic  solution Place 1 drop into the left eye 4 (four) times daily.     natamycin (NATACYN) 5 % ophthalmic suspension Place 1 drop into the left eye every 2 (two) hours while awake.     NON FORMULARY voriconazole (VFEND) ophthalmic solution 1 Administer 1 drop into the left eye every hour for 14 days. Discard after 7 days and refill for full 14 days.     ofloxacin (OCUFLOX) 0.3 % ophthalmic solution Place 1 drop into the right eye 4 (four) times daily.     ondansetron (ZOFRAN) 4 MG tablet Take 1 tablet (4 mg total) by mouth every 8 (eight) hours as needed for nausea or vomiting. 20 tablet 0   PARoxetine (PAXIL) 30 MG tablet Take 1 tablet (30 mg total) by mouth daily. 90 tablet 1   prednisoLONE acetate (PRED FORTE) 1 % ophthalmic suspension      vitamin k 100 MCG tablet Take 100 mcg by mouth 2 (two) times a week.     Water For Injection Sterile (STERILE WATER, PRESERVATIVE FREE,) injection      No current facility-administered medications for this visit.   Facility-Administered Medications Ordered in Other Visits  Medication Dose Route Frequency Provider Last Rate Last Admin   goserelin (ZOLADEX) injection 3.6 mg  3.6 mg Subcutaneous Once Rickard Patience, MD        Family History Family History  Problem Relation Age of Onset   Breast cancer Maternal Aunt 45       currently ~65   Diabetes Mother    Skin cancer Mother        currently 7; TAH/BSO (age?)   Anemia Mother    Liver disease Father        deceased / not much info about him / alcoholic   Lung cancer Paternal Aunt        smoker / deceased 3s   Stomach cancer Paternal Uncle 52       deceased / deceased 25s   Ovarian cancer Maternal Grandmother 84       deceased 90s   Stomach cancer Paternal Grandfather 59       deceased 25s   Cancer Maternal Uncle        unk. type; deceased 56s   Leukemia Cousin 28       deceased 27       Social History Social History   Tobacco Use   Smoking status: Former    Current packs/day: 0.00     Types: Cigarettes    Start date: 08/12/1972    Quit date: 08/13/1979    Years since quitting: 43.8   Smokeless tobacco: Never   Tobacco comments:    AGE [age]  Vaping Use   Vaping status: Never Used  Substance  Use Topics   Alcohol use: No   Drug use: No       ROS Full ROS of systems performed and is otherwise negative there than what is stated in the HPI  Physical Exam Blood pressure 119/67, pulse 67, temperature 98.5 F (36.9 C), temperature source Oral, height 5' (1.524 m), weight 173 lb 12.8 oz (78.8 kg), SpO2 96%.  No acute distress, alert and oriented x 3, normal work of breathing on room air, moving all extremities spontaneously, abdomen is soft, tender to palpation in the right upper quadrant without Murphy sign. Data Reviewed Ultrasound revealed cholelithiasis  I have personally reviewed the patient's imaging and medical records.    Assessment    62 year old with symptomatic cholelithiasis  Plan    We will plan for robotic assisted cholecystectomy.  I discussed the risk, benefits alternatives of the procedure including risk of infection, bleeding, retained stone, bile leak, injury to the common bile duct and need for open procedure.  The patient understands these risks and wishes to proceed with surgery.   Kandis Cocking 06/12/2023, 2:44 PM

## 2023-06-12 NOTE — Patient Instructions (Signed)
You have requested to have your gallbladder removed. This will be done at [facility] with Dr. Maurine Minister.  You will most likely be out of work 1-2 weeks for this surgery.  If you have FMLA or disability paperwork that needs filled out you may drop this off at our office or this can be faxed to [phone].  You will return after your post-op appointment with a lifting restriction for approximately 4 more weeks.  You will be able to eat anything you would like to following surgery. But, start by eating a bland diet and advance this as tolerated. The Gallbladder diet is below, please go as closely by this diet as possible prior to surgery to avoid any further attacks.  Please see the (blue)pre-care form that you have been given today. Our surgery scheduler will call you to verify surgery date and to go over information.   If you have any questions, please call our office.  Laparoscopic Cholecystectomy Laparoscopic cholecystectomy is surgery to remove the gallbladder. The gallbladder is located in the upper right part of the abdomen, behind the liver. It is a storage sac for bile, which is produced in the liver. Bile aids in the digestion and absorption of fats. Cholecystectomy is often done for inflammation of the gallbladder (cholecystitis). This condition is usually caused by a buildup of gallstones (cholelithiasis) in the gallbladder. Gallstones can block the flow of bile, and that can result in inflammation and pain. In severe cases, emergency surgery may be required. If emergency surgery is not required, you will have time to prepare for the procedure. Laparoscopic surgery is an alternative to open surgery. Laparoscopic surgery has a shorter recovery time. Your common bile duct may also need to be examined during the procedure. If stones are found in the common bile duct, they may be removed. LET [facility] CARE PROVIDER KNOW ABOUT: Any allergies you have. All medicines you are taking,  including vitamins, herbs, eye drops, creams, and over-the-counter medicines. Previous problems you or members of your family have had with the use of anesthetics. Any blood disorders you have. Previous surgeries you have had.  Any medical conditions you have. RISKS AND COMPLICATIONS Generally, this is a safe procedure. However, problems may occur, including: Infection. Bleeding. Allergic reactions to medicines. Damage to other structures or organs. A stone remaining in the common bile duct. A bile leak from the cyst duct that is clipped when your gallbladder is removed. The need to convert to open surgery, which requires a larger incision in the abdomen. This may be necessary if your surgeon thinks that it is not safe to continue with a laparoscopic procedure. BEFORE THE PROCEDURE Ask your health care provider about: Changing or stopping your regular medicines. This is especially important if you are taking diabetes medicines or blood thinners. Taking medicines such as aspirin and ibuprofen. These medicines can thin your blood. Do not take these medicines before your procedure if your health care provider instructs you not to. Follow instructions from your health care provider about eating or drinking restrictions. Let your health care provider know if you develop a cold or an infection before surgery. Plan to have someone take you home after the procedure. Ask your health care provider how your surgical site will be marked or identified. You may be given antibiotic medicine to help prevent infection. PROCEDURE To reduce your risk of infection: Your health care team will wash or sanitize their hands. Your skin will be washed with soap. An IV  tube may be inserted into one of your veins. You will be given a medicine to make you fall asleep (general anesthetic). A breathing tube will be placed in your mouth. The surgeon will make several small cuts (incisions) in your abdomen. A thin,  lighted tube (laparoscope) that has a tiny camera on the end will be inserted through one of the small incisions. The camera on the laparoscope will send a picture to a TV screen (monitor) in the operating room. This will give the surgeon a good view inside your abdomen. A gas will be pumped into your abdomen. This will expand your abdomen to give the surgeon more room to perform the surgery. Other tools that are needed for the procedure will be inserted through the other incisions. The gallbladder will be removed through one of the incisions. After your gallbladder has been removed, the incisions will be closed with stitches (sutures), staples, or skin glue. Your incisions may be covered with a bandage (dressing). The procedure may vary among health care providers and hospitals. AFTER THE PROCEDURE Your blood pressure, heart rate, breathing rate, and blood oxygen level will be monitored often until the medicines you were given have worn off. You will be given medicines as needed to control your pain.   This information is not intended to replace advice given to you by your health care provider. Make sure you discuss any questions you have with your health care provider.   Document Released: 07/29/2005 Document Revised: 04/19/2015 Document Reviewed: 03/10/2013 Elsevier Interactive Patient Education 2016 [employer] Inc[employer]   Low-Fat Diet for Gallbladder Conditions A low-fat diet can be helpful if you have pancreatitis or a gallbladder condition. With these conditions, your pancreas and gallbladder have trouble digesting fats. A healthy eating plan with less fat will help rest your pancreas and gallbladder and reduce your symptoms. WHAT DO I NEED TO KNOW ABOUT THIS DIET? Eat a low-fat diet. Reduce your fat intake to less than 20-30% of your total daily calories. This is less than 50-60 g of fat per day. Remember that you need some fat in your diet. Ask your dietician what your daily goal should  be. Choose nonfat and low-fat healthy foods. Look for the words "nonfat," "low fat," or "fat free." As a guide, look on the label and choose foods with less than 3 g of fat per serving. Eat only one serving. Avoid alcohol. Do not smoke. If you need help quitting, talk with your health care provider. Eat small frequent meals instead of three large heavy meals. WHAT FOODS CAN I EAT? Grains Include healthy grains and starches such as potatoes, wheat bread, fiber-rich cereal, and brown rice. Choose whole grain options whenever possible. In adults, whole grains should account for 45-65% of your daily calories.  Fruits and Vegetables Eat plenty of fruits and vegetables. Fresh fruits and vegetables add fiber to your diet. Meats and Other Protein Sources Eat lean meat such as chicken and pork. Trim any fat off of meat before cooking it. Eggs, fish, and beans are other sources of protein. In adults, these foods should account for 10-35% of your daily calories. Dairy Choose low-fat milk and dairy options. Dairy includes fat and protein, as well as calcium.  Fats and Oils Limit high-fat foods such as fried foods, sweets, baked goods, sugary drinks.  Other Creamy sauces and condiments, such as mayonnaise, can add extra fat. Think about whether or not you need to use them, or use smaller amounts or low fat options.  WHAT FOODS ARE NOT RECOMMENDED? High fat foods, such as: [employer]. Ice cream. [place] toast. Sweet rolls. Pizza. Cheese bread. Foods covered with batter, butter, creamy sauces, or cheese. Fried foods. Sugary drinks and desserts. Foods that cause gas or bloating   This information is not intended to replace advice given to you by your health care provider. Make sure you discuss any questions you have with your health care provider.   Document Released: 08/03/2013 Document Reviewed: 08/03/2013 Elsevier Interactive Patient Education [employer]

## 2023-06-13 NOTE — Telephone Encounter (Signed)
Patient calls back, she is now informed of all dates regarding surgery.  

## 2023-06-17 ENCOUNTER — Encounter
Admission: RE | Admit: 2023-06-17 | Discharge: 2023-06-17 | Disposition: A | Discharge status: Home / Self Care | Payer: Medicaid Other | Source: Ambulatory Visit | Attending: General Surgery | Admitting: General Surgery

## 2023-06-17 ENCOUNTER — Other Ambulatory Visit: Payer: Self-pay

## 2023-06-17 HISTORY — DX: Prediabetes: R73.03

## 2023-06-17 HISTORY — DX: Personal history of urinary calculi: Z87.442

## 2023-06-17 HISTORY — DX: Sleep apnea, unspecified: G47.30

## 2023-06-17 HISTORY — DX: Depression, unspecified: F32.A

## 2023-06-17 NOTE — Patient Instructions (Addendum)
Your procedure is scheduled on: 06/20/23 - Friday Report to the Registration Desk on the 1st floor of the [facility]. To find out your arrival time, please call [phone] between 1PM - 3PM on: 06/19/23 - Thursday If your arrival time is 6:00 am, do not arrive before that time as the [facility] entrance doors do not open until 6:00 am.  REMEMBER: Instructions that are not followed completely may result in serious medical risk, up to and including death; or upon the discretion of your surgeon and anesthesiologist your surgery may need to be rescheduled.  Do not eat food or drink any liquids after midnight the night before surgery.  No gum chewing or hard candies.   One week prior to surgery: Stop Anti-inflammatories (NSAIDS) such as  diclofenac (VOLTAREN) , Advil, Aleve, Ibuprofen, Motrin, Naproxen, Naprosyn and Aspirin based products such as Excedrin, Goody's Powder, BC Powder. You may take Tylenol if needed for pain up until the day of surgery.  Stop ANY OVER THE COUNTER supplements until after surgery.   ON THE DAY OF SURGERY ONLY TAKE THESE MEDICATIONS WITH SIPS OF WATER:  cycloSPORINE 0.1 % SOLN  loteprednol (LOTEMAX)  PARoxetine (PAXIL)  prednisoLONE acetate    No Alcohol for 24 hours before or after surgery.  No Smoking including e-cigarettes for 24 hours before surgery.  No chewable tobacco products for at least 6 hours before surgery.  No nicotine patches on the day of surgery.  Do not use any "recreational" drugs for at least a week (preferably 2 weeks) before your surgery.  Please be advised that the combination of cocaine and anesthesia may have negative outcomes, up to and including death. If you test positive for cocaine, your surgery will be cancelled.  On the morning of surgery brush your teeth with toothpaste and water, you may rinse your mouth with mouthwash if you wish. Do not swallow any toothpaste or mouthwash.  Use CHG Soap or wipes as directed on  instruction sheet.  Do not wear jewelry, make-up, hairpins, clips or nail polish.  For welded (permanent) jewelry: bracelets, anklets, waist bands, etc.  Please have this removed prior to surgery.  If it is not removed, there is a chance that hospital personnel will need to cut it off on the day of surgery.  Do not wear lotions, powders, or perfumes.   Do not shave body hair from the neck down 48 hours before surgery.  Contact lenses, hearing aids and dentures may not be worn into surgery.  Do not bring valuables to the hospital. [facility] is not responsible for any missing/lost belongings or valuables.   Notify your doctor if there is any change in your medical condition (cold, fever, infection).  Wear comfortable clothing (specific to your surgery type) to the hospital.  After surgery, you can help prevent lung complications by doing breathing exercises.  Take deep breaths and cough every 1-2 hours. Your doctor may order a device called an Incentive Spirometer to help you take deep breaths. When coughing or sneezing, hold a pillow firmly against your incision with both hands. This is called "splinting." Doing this helps protect your incision. It also decreases belly discomfort.  If you are being admitted to the hospital overnight, leave your suitcase in the car. After surgery it may be brought to your room.  In case of increased patient census, it may be necessary for you, the patient, to continue your postoperative care in the Same Day Surgery department.  If you are  being discharged the day of surgery, you will not be allowed to drive home. You will need a responsible individual to drive you home and stay with you for 24 hours after surgery.   If you are taking public transportation, you will need to have a responsible individual with you.  Please call the Pre-admissions Testing Dept. at [phone] if you have any questions about these instructions.  Surgery Visitation  Policy:  Patients having surgery or a procedure may have two visitors.  Children under the age of 80 must have an adult with them who is not the patient.  Inpatient Visitation:    Visiting hours are 7 a.m. to 8 p.m. Up to four visitors are allowed at one time in a patient room. The visitors may rotate out with other people during the day.  One visitor age 53 or older may stay with the patient overnight and must be in the room by 8 p.m.    Preparing for Surgery with CHLORHEXIDINE GLUCONATE (CHG) Soap  Chlorhexidine Gluconate (CHG) Soap  o An antiseptic cleaner that kills germs and bonds with the skin to continue killing germs even after washing  o Used for showering the night before surgery and morning of surgery  Before surgery, you can play an important role by reducing the number of germs on your skin.  CHG (Chlorhexidine gluconate) soap is an antiseptic cleanser which kills germs and bonds with the skin to continue killing germs even after washing.  Please do not use if you have an allergy to CHG or antibacterial soaps. If your skin becomes reddened/irritated stop using the CHG.  1. Shower the NIGHT BEFORE SURGERY and the MORNING OF SURGERY with CHG soap.  2. If you choose to wash your hair, wash your hair first as usual with your normal shampoo.  3. After shampooing, rinse your hair and body thoroughly to remove the shampoo.  4. Use CHG as you would any other liquid soap. You can apply CHG directly to the skin and wash gently with a scrungie or a clean washcloth.  5. Apply the CHG soap to your body only from the neck down. Do not use on open wounds or open sores. Avoid contact with your eyes, ears, mouth, and genitals (private parts). Wash face and genitals (private parts) with your normal soap.  6. Wash thoroughly, paying special attention to the area where your surgery will be performed.  7. Thoroughly rinse your body with warm water.  8. Do not shower/wash with your  normal soap after using and rinsing off the CHG soap.  9. Pat yourself dry with a clean towel.  10. Wear clean pajamas to bed the night before surgery.  12. Place clean sheets on your bed the night of your first shower and do not sleep with pets.  13. Shower again with the CHG soap on the day of surgery prior to arriving at the hospital.  14. Do not apply any deodorants/lotions/powders.  15. Please wear clean clothes to the hospital.

## 2023-06-19 ENCOUNTER — Inpatient Hospital Stay: Payer: Medicaid Other | Admitting: Oncology

## 2023-06-19 ENCOUNTER — Inpatient Hospital Stay: Payer: Medicaid Other | Attending: Oncology

## 2023-06-19 ENCOUNTER — Encounter: Payer: Self-pay | Admitting: Oncology

## 2023-06-19 VITALS — BP 112/71 | HR 59 | Temp 98.0°F | Resp 18 | Wt 178.7 lb

## 2023-06-19 DIAGNOSIS — Z87891 Personal history of nicotine dependence: Secondary | ICD-10-CM | POA: Insufficient documentation

## 2023-06-19 DIAGNOSIS — Z888 Allergy status to other drugs, medicaments and biological substances status: Secondary | ICD-10-CM | POA: Diagnosis not present

## 2023-06-19 DIAGNOSIS — K8020 Calculus of gallbladder without cholecystitis without obstruction: Secondary | ICD-10-CM | POA: Insufficient documentation

## 2023-06-19 DIAGNOSIS — Z885 Allergy status to narcotic agent status: Secondary | ICD-10-CM | POA: Diagnosis not present

## 2023-06-19 DIAGNOSIS — C50212 Malignant neoplasm of upper-inner quadrant of left female breast: Secondary | ICD-10-CM

## 2023-06-19 DIAGNOSIS — Z808 Family history of malignant neoplasm of other organs or systems: Secondary | ICD-10-CM | POA: Insufficient documentation

## 2023-06-19 DIAGNOSIS — Z170 Estrogen receptor positive status [ER+]: Secondary | ICD-10-CM | POA: Insufficient documentation

## 2023-06-19 DIAGNOSIS — Z801 Family history of malignant neoplasm of trachea, bronchus and lung: Secondary | ICD-10-CM | POA: Insufficient documentation

## 2023-06-19 DIAGNOSIS — E0590 Thyrotoxicosis, unspecified without thyrotoxic crisis or storm: Secondary | ICD-10-CM | POA: Diagnosis not present

## 2023-06-19 DIAGNOSIS — M8580 Other specified disorders of bone density and structure, unspecified site: Secondary | ICD-10-CM

## 2023-06-19 DIAGNOSIS — Z79899 Other long term (current) drug therapy: Secondary | ICD-10-CM | POA: Diagnosis not present

## 2023-06-19 DIAGNOSIS — E282 Polycystic ovarian syndrome: Secondary | ICD-10-CM | POA: Diagnosis not present

## 2023-06-19 DIAGNOSIS — Z1721 Progesterone receptor positive status: Secondary | ICD-10-CM | POA: Insufficient documentation

## 2023-06-19 DIAGNOSIS — Z9089 Acquired absence of other organs: Secondary | ICD-10-CM | POA: Insufficient documentation

## 2023-06-19 DIAGNOSIS — M3500 Sicca syndrome, unspecified: Secondary | ICD-10-CM | POA: Diagnosis not present

## 2023-06-19 DIAGNOSIS — Z881 Allergy status to other antibiotic agents status: Secondary | ICD-10-CM | POA: Diagnosis not present

## 2023-06-19 DIAGNOSIS — Z8041 Family history of malignant neoplasm of ovary: Secondary | ICD-10-CM | POA: Insufficient documentation

## 2023-06-19 DIAGNOSIS — Z803 Family history of malignant neoplasm of breast: Secondary | ICD-10-CM | POA: Diagnosis not present

## 2023-06-19 DIAGNOSIS — Z87442 Personal history of urinary calculi: Secondary | ICD-10-CM | POA: Insufficient documentation

## 2023-06-19 DIAGNOSIS — Z8614 Personal history of Methicillin resistant Staphylococcus aureus infection: Secondary | ICD-10-CM | POA: Insufficient documentation

## 2023-06-19 DIAGNOSIS — R9230 Dense breasts, unspecified: Secondary | ICD-10-CM | POA: Diagnosis not present

## 2023-06-19 DIAGNOSIS — Z832 Family history of diseases of the blood and blood-forming organs and certain disorders involving the immune mechanism: Secondary | ICD-10-CM | POA: Insufficient documentation

## 2023-06-19 DIAGNOSIS — Z79810 Long term (current) use of selective estrogen receptor modulators (SERMs): Secondary | ICD-10-CM | POA: Insufficient documentation

## 2023-06-19 DIAGNOSIS — F419 Anxiety disorder, unspecified: Secondary | ICD-10-CM | POA: Diagnosis not present

## 2023-06-19 DIAGNOSIS — Z806 Family history of leukemia: Secondary | ICD-10-CM | POA: Insufficient documentation

## 2023-06-19 DIAGNOSIS — Z800 Family history of malignant neoplasm of digestive organs: Secondary | ICD-10-CM | POA: Insufficient documentation

## 2023-06-19 DIAGNOSIS — Z79811 Long term (current) use of aromatase inhibitors: Secondary | ICD-10-CM | POA: Diagnosis not present

## 2023-06-19 DIAGNOSIS — Z833 Family history of diabetes mellitus: Secondary | ICD-10-CM | POA: Insufficient documentation

## 2023-06-19 LAB — CBC WITH DIFFERENTIAL (CANCER CENTER ONLY)
Abs Immature Granulocytes: 0.01 10*3/uL (ref 0.00–0.07)
Basophils Absolute: 0 10*3/uL (ref 0.0–0.1)
Basophils Relative: 0 %
Eosinophils Absolute: 0.1 10*3/uL (ref 0.0–0.5)
Eosinophils Relative: 2 %
HCT: 35.1 % — ABNORMAL LOW (ref 36.0–46.0)
Hemoglobin: 11.7 g/dL — ABNORMAL LOW (ref 12.0–15.0)
Immature Granulocytes: 0 %
Lymphocytes Relative: 40 %
Lymphs Abs: 2 10*3/uL (ref 0.7–4.0)
MCH: 32.2 pg (ref 26.0–34.0)
MCHC: 33.3 g/dL (ref 30.0–36.0)
MCV: 96.7 fL (ref 80.0–100.0)
Monocytes Absolute: 0.5 10*3/uL (ref 0.1–1.0)
Monocytes Relative: 9 %
Neutro Abs: 2.4 10*3/uL (ref 1.7–7.7)
Neutrophils Relative %: 49 %
Platelet Count: 257 10*3/uL (ref 150–400)
RBC: 3.63 MIL/uL — ABNORMAL LOW (ref 3.87–5.11)
RDW: 12.8 % (ref 11.5–15.5)
WBC Count: 5 10*3/uL (ref 4.0–10.5)
nRBC: 0 % (ref 0.0–0.2)

## 2023-06-19 LAB — CMP (CANCER CENTER ONLY)
ALT: 18 U/L (ref 0–44)
AST: 21 U/L (ref 15–41)
Albumin: 3.4 g/dL — ABNORMAL LOW (ref 3.5–5.0)
Alkaline Phosphatase: 100 U/L (ref 38–126)
Anion gap: 5 (ref 5–15)
BUN: 17 mg/dL (ref 8–23)
CO2: 28 mmol/L (ref 22–32)
Calcium: 9 mg/dL (ref 8.9–10.3)
Chloride: 107 mmol/L (ref 98–111)
Creatinine: 0.67 mg/dL (ref 0.44–1.00)
GFR, Estimated: >60 mL/min (ref >60)
Glucose, Bld: 80 mg/dL (ref 70–99)
Potassium: 4.4 mmol/L (ref 3.5–5.1)
Sodium: 140 mmol/L (ref 135–145)
Total Bilirubin: 0.4 mg/dL (ref <1.2)
Total Protein: 6 g/dL — ABNORMAL LOW (ref 6.5–8.1)

## 2023-06-19 MED ORDER — INDOCYANINE GREEN 25 MG IV SOLR
1.2500 mg | Freq: Once | INTRAVENOUS | Status: AC
  Administered 2023-06-20: 1.25 mg via INTRAVENOUS

## 2023-06-19 MED ORDER — CHLORHEXIDINE GLUCONATE CLOTH 2 % EX PADS: 6.0000 | MEDICATED_PAD | Freq: Once | CUTANEOUS | Status: DC

## 2023-06-19 MED ORDER — LACTATED RINGERS IV SOLN: INTRAVENOUS | Status: DC

## 2023-06-19 MED ORDER — CHLORHEXIDINE GLUCONATE 0.12 % MT SOLN
15.0000 mL | Freq: Once | OROMUCOSAL | Status: AC
  Administered 2023-06-20: 15 mL via OROMUCOSAL

## 2023-06-19 MED ORDER — CHLORHEXIDINE GLUCONATE CLOTH 2 % EX PADS
6.0000 | MEDICATED_PAD | Freq: Once | CUTANEOUS | Status: AC
  Administered 2023-06-20: 6 via TOPICAL

## 2023-06-19 MED ORDER — ORAL CARE MOUTH RINSE: 15.0000 mL | Freq: Once | OROMUCOSAL | Status: AC

## 2023-06-19 MED ORDER — SODIUM CHLORIDE 0.9 % IV SOLN
2.0000 g | INTRAVENOUS | Status: AC
  Administered 2023-06-20: 2 g via INTRAVENOUS

## 2023-06-19 NOTE — Assessment & Plan Note (Addendum)
Patient is on Paxil.  Follow-up with PCP

## 2023-06-19 NOTE — Assessment & Plan Note (Addendum)
Stage IA, left Breast cancer.  Labs reviewed and discussed with patient. Continue Arimidex, extended endocrine therapy, till March 2029 if she tolerates.  Recommend annual mammogram. - October 2024 result reviewed.

## 2023-06-19 NOTE — Anesthesia Preprocedure Evaluation (Addendum)
Anesthesia Evaluation  Patient identified by MRN, date of birth, ID band Patient awake    Reviewed: Allergy & Precautions, NPO status , Patient's Chart, lab work & pertinent test results  History of Anesthesia Complications Negative for: history of anesthetic complications  Airway Mallampati: IV   Neck ROM: Full    Dental  (+) Missing   Pulmonary sleep apnea and Continuous Positive Airway Pressure Ventilation , former smoker (quit 1981)   Pulmonary exam normal breath sounds clear to auscultation       Cardiovascular Exercise Tolerance: Good negative cardio ROS Normal cardiovascular exam Rhythm:Regular Rate:Normal     Neuro/Psych  PSYCHIATRIC DISORDERS Anxiety Depression    negative neurological ROS     GI/Hepatic ,GERD  ,,  Endo/Other   Hyperthyroidism Obesity  Renal/GU Renal disease (nephrolithiasis)     Musculoskeletal  (+) Arthritis ,  Sjogren syndrome   Abdominal   Peds  Hematology Breast CA   Anesthesia Other Findings   Reproductive/Obstetrics                             Anesthesia Physical Anesthesia Plan  ASA: 2  Anesthesia Plan: General   Post-op Pain Management:    Induction: Intravenous  PONV Risk Score and Plan: 3 and Ondansetron, Dexamethasone and Treatment may vary due to age or medical condition  Airway Management Planned: Oral ETT  Additional Equipment:   Intra-op Plan:   Post-operative Plan: Extubation in OR  Informed Consent: I have reviewed the patients History and Physical, chart, labs and discussed the procedure including the risks, benefits and alternatives for the proposed anesthesia with the patient or authorized representative who has indicated his/her understanding and acceptance.     Dental advisory given  Plan Discussed with: CRNA  Anesthesia Plan Comments: (Patient consented for risks of anesthesia including but not limited to:  - adverse  reactions to medications - damage to eyes, teeth, lips or other oral mucosa - nerve damage due to positioning  - sore throat or hoarseness - damage to heart, brain, nerves, lungs, other parts of body or loss of life  Informed patient about role of CRNA in peri- and intra-operative care.  Patient voiced understanding.)       Anesthesia Quick Evaluation

## 2023-06-19 NOTE — Progress Notes (Signed)
Hematology/Oncology Progress note [phone] [phone]     REASON FOR VISIT Follow up for breast cancer  ASSESSMENT & PLAN:   Cancer Staging  Malignant neoplasm of upper-inner quadrant of left breast in female, estrogen receptor positive (HCC) Staging form: Breast, AJCC 8th Edition - Clinical stage from 05/22/2017: Stage IA (cT1, cN0, cM0, G1, ER+, PR+, HER2-) - Signed by Rickard Patience, MD on 05/22/2017 - Pathologic stage from 07/09/2017: Stage IA (pT1b, pN0, cM0, G2, ER+, PR+, HER2-) - Signed by Rickard Patience, MD on 07/30/2017   Malignant neoplasm of upper-inner quadrant of left breast in female, estrogen receptor positive (HCC) Stage IA, left Breast cancer.  Labs reviewed and discussed with patient. Continue Arimidex, extended endocrine therapy, till March 2029 if she tolerates.  Recommend annual mammogram. - October 2024 result reviewed.    Osteopenia recommend patient to continue calcium and vitamin D supplementation.  06/19/21 DEXA, osteopenia has improved- normalized Repeat DEXA prior to next visit  Anxiety Patient is on Paxil.  Follow-up with PCP  No orders of the defined types were placed in this encounter.  Follow-up in 6 months. All questions were answered. The patient knows to call the clinic with any problems, questions or concerns.  Rickard Patience, MD, PhD [facility] Hematology Oncology 06/19/2023    HISTORY OF PRESENTING ILLNESS:  Sheena Martinez is a  62 y.o.  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 Margin is negative, no LVI. Mammoprintr revealed 10 year recurrence rate at 29% high risk group. Patient has completed MammoSite RT. Denies any hormonal replacement therapy.  Adjuvant chemotherapy with TC, finished 3 cycles.  She is perimenopausal.   # His Sjogren disease for which she has had tear duct surgery. She also reports history of PCOS with elevated testosterone level.    Genetic testing:  Genetic testing result vis [facility] showed no pathogenic sequence appearance or deletions /duplications.  # she developed corneal ulcer for which she has to undergo emergency ophthalmology surgery.  This is considered to be related to Sjogren's syndrome.  Patient has been started on prednisone by ophthalmologist.  Her anxiety has not been well controlled lately due to her ophthalmology problems.  Patient has been on Paxil for a long time and recently switched to Lexapro as Paxil interfere with efficacy of tamoxifen.  She feels her anxiety is not well controlled.  Treatment Adjuvant TC x3. Patient declined cycle 4.  Started on Tamoxifen in March 2019. Declined option of ovarian suppression with aromatase inhibitor as patient is concerned about having new side effects.    She has anxiety and follows up with [facility] psychiatrist and was previously on Zoloft and Ativan.  Zoloft was switched back to Paxil after tamoxifen was discontinued.  Hyperthyroidism, on methimazole.  Follows up with endocrinology. Cornea ulcer, s/p cornea transplant. Currently on steroid eye drop Denies any fever, chills, chest pain, shortness of breath, abdominal pain, back pain, lower extremity swelling. Denies any concern of her breast.  She has had bilateral diagnostic mammogram done on 05/06/2019  which did not show malignant findings. Bone density 03/15/2019 showed osteopenia.   #Adjuvant tamoxifen, switched to Zoladex plus Arimidex, Zoladex was discontinued after confirming postmenopausal state.   patient had needed multiple eye.  Injections for treatment of MRSA left infection and has been on vancomycin eyedrops.  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  She follows up mostly with her ophthalmologist.  She is also  on methotrexate for autoimmune diseases.  Follows up with rheumatology.   INTERVAL HISTORY Sheena Martinez is a 62 y.o. female who has above history reviewed by me today  presents for follow up visit for management of breast cancer.  Patient takes Arimidex daily.  Overall tolerates well.  No new complaints.  She has no breast concerns today. Patient has plan for elective cholecystectomy tomorrow.   Review of Systems  Constitutional:  Negative for appetite change, chills, fatigue and fever.  HENT:   Negative for hearing loss and voice change.   Eyes:  Positive for eye problems.  Respiratory:  Negative for chest tightness and cough.   Cardiovascular:  Negative for chest pain.  Gastrointestinal:  Negative for abdominal distention, abdominal pain and blood in stool.  Endocrine: Positive for hot flashes.  Genitourinary:  Negative for difficulty urinating and frequency.   Musculoskeletal:  Positive for arthralgias.  Skin:  Negative for itching and rash.  Neurological:  Negative for extremity weakness.  Hematological:  Negative for adenopathy.  Psychiatric/Behavioral:  Negative for confusion. The patient is nervous/anxious.     MEDICAL HISTORY:  Past Medical History:  Diagnosis Date   Anxiety    Arthritis    Breast cancer (HCC) 05/12/2017   INVASIVE MAMMARY CARCINOMA ER/PR positive LEFT BREAST UPPER inner  QUAD   Cataract 01/11/2020   left   Corneal perforation of left eye 10/09/2017   Overview:  Added automatically from request for surgery [id number]   Depression    siuational   GERD (gastroesophageal reflux disease)    OCC   Goiter    Hematuria 10/21/2018   History of kidney stones    Hyperthyroidism    has nodules, no meds   MRSA infection 2023   corneal infections in the left eye   Personal history of radiation therapy 2018   LEFT lumpectomy   Pre-diabetes    lost 64 pds   Sjogren's syndrome (HCC)    Sleep apnea    Uses continuous positive airway pressure (CPAP) ventilation at home 04/30/2019    SURGICAL HISTORY: Past Surgical History:  Procedure Laterality Date   BREAST BIOPSY Left 05/12/2017   [place] bx/ INVASIVE MAMMARY CARCINOMA    BREAST LUMPECTOMY Left 05/2017   invasive mammary carcinoma, neg margins   BREAST LUMPECTOMY WITH SENTINEL LYMPH NODE BIOPSY Left 06/10/2017   Procedure: BREAST LUMPECTOMY WITH SENTINEL LYMPH NODE BX AND NEEDLE LOCALIZATION;  Surgeon: Earline Mayotte, MD;  Location: [facility] ORS;  Service: General;  Laterality: Left;   BREAST MAMMOSITE Left 06/24/2017   Procedure: MAMMOSITE BREAST;  Surgeon: Earline Mayotte, MD;  Location: [facility] ORS;  Service: General;  Laterality: Left;   CESAREAN SECTION  1995   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   COSMETIC SURGERY     CYST EXCISION  2018   pilar cyst/ Dr Orma Flaming   EYE SURGERY     PORTACATH PLACEMENT Right 07/08/2017   Procedure: INSERTION PORT-A-CATH;  Surgeon: Earline Mayotte, MD;  Location: [facility] ORS;  Service: General;  Laterality: Right;   TONSILLECTOMY      SOCIAL HISTORY: Social History   Socioeconomic History   Marital status: Married    Spouse name: Sherrine Maples   Number of children: 3   Years of education: Not on file   Highest education level: GED or equivalent  Occupational History   Occupation: unemployed  Tobacco Use   Smoking status: Former    Current packs/day: 0.00  Types: Cigarettes    Start date: 08/12/1972    Quit date: 08/13/1979    Years since quitting: 43.8   Smokeless tobacco: Never   Tobacco comments:    [age]  Vaping Use   Vaping status: Never Used  Substance and Sexual Activity   Alcohol use: No   Drug use: No   Sexual activity: Yes    Partners: Male  Other Topics Concern   Not on file  Social History Narrative   Lives in home with 2 sons, 2 grandson   Social Determinants of Health   Financial Resource Strain: Low Risk  (07/12/2022)   Received from [facility], [facility]   Overall Financial Resource Strain (CARDIA)    Difficulty of Paying Living Expenses: Not very hard  Food Insecurity: No Food Insecurity (07/12/2022)   Received [facility], [facility]   Hunger Vital Sign    Worried About Running Out of Food in the Last Year: Never true    Ran Out of Food in the Last Year: Never true  Transportation Needs: No Transportation Needs (07/12/2022)   Received from [facility], [facility]   [facility] - Transportation    Lack of Transportation (Medical): No    Lack of Transportation (Non-Medical): No  Physical Activity: Inactive (07/24/2018)   Exercise Vital Sign    Days of Exercise per Week: 0 days    Minutes of Exercise per Session: 0 min  Stress: Stress Concern Present (07/24/2018)   [employer] of Occupational Health - Occupational Stress Questionnaire    Feeling of Stress : Very much  Social Connections: Socially Integrated (07/24/2018)   Social Connection and Isolation Panel [NHANES]    Frequency of Communication with Friends and Family: More than three times a week    Frequency of Social Gatherings with Friends and Family: More than three times a week    Attends Religious Services: More than 4 times per year    Active Member of [employer] or Organizations: Yes    Attends Engineer, structural: More than 4 times per year    Marital Status: Married  Catering manager Violence: Not At Risk (07/24/2018)   Humiliation, Afraid, Rape, and Kick questionnaire    Fear of Current or Ex-Partner: No    Emotionally Abused: No    Physically Abused: No    Sexually Abused: No    FAMILY HISTORY: Family History  Problem Relation Age of Onset   Breast cancer Maternal Aunt 45       currently ~65   Diabetes Mother    Skin cancer Mother        currently 43; TAH/BSO (age?)   Anemia Mother    Liver disease Father        deceased / not much info about him / alcoholic   Lung cancer Paternal Aunt        smoker / deceased 39s   Stomach cancer Paternal Uncle 94       deceased / deceased 39s   Ovarian cancer Maternal Grandmother 85       deceased 90s   Stomach cancer Paternal Grandfather 46       deceased 77s   Cancer Maternal  Uncle        unk. type; deceased 85s   Leukemia Cousin 71       deceased 27    ALLERGIES:  is allergic to rituximab, benadryl [diphenhydramine], clonazepam, cortisone, diphenhydramine hcl, oxycodone, and sulfamethoxazole-trimethoprim.  MEDICATIONS:  Current Outpatient Medications  Medication Sig Dispense Refill   acetaminophen (TYLENOL) 500 MG tablet Take 1,000 mg by mouth every 8 (eight) hours as needed for mild pain or headache.     anastrozole (ARIMIDEX) 1 MG tablet Take 1 tablet (1 mg total) by mouth daily. 90 tablet 1   busPIRone (BUSPAR) 10 MG tablet Take 1 tablet (10 mg total) by mouth 3 (three) times daily. (Patient not taking: Reported on 06/13/2023) 90 tablet 1   Ca Carbonate-Mag Hydroxide (ROLAIDS PO) Take 1 tablet by mouth as needed (indigestion).     Chlorhexidine Gluconate 2 % LIQD Apply 15 mLs topically daily. (Patient not taking: Reported on 06/13/2023) 150 mL 0   Cholecalciferol (VITAMIN D) 50 MCG (2000 UT) tablet Take 2,000 Units by mouth daily.     cycloSPORINE 0.1 % SOLN Place 1 drop into the left eye in the morning, at noon, in the evening, and at bedtime.     diclofenac (VOLTAREN) 50 MG EC tablet Take 50 mg by mouth 2 (two) times daily with a meal.     diclofenac Sodium (VOLTAREN) 1 % GEL Apply 2 g topically 4 (four) times daily. (Patient taking differently: Apply 2 g topically daily as needed (pain).) 100 g 3   hydroxypropyl methylcellulose / hypromellose (ISOPTO TEARS / GONIOVISC) 2.5 % ophthalmic solution Place 1 drop into both eyes as needed for dry eyes.     ketoconazole (NIZORAL) 2 % cream Apply 1 Application topically daily as needed for irritation.     ketoconazole (NIZORAL) 2 % shampoo Apply 1 Application topically once a week.     LORazepam (ATIVAN) 0.5 MG tablet May take 0.5-1 mg (1 to 2 tablets) po daily prn for severe stress/anxiety symptoms, use sparingly, not for daily use, rx to last more than 3 months, no refills 20 tablet 0   loteprednol (LOTEMAX) 0.5 %  ophthalmic suspension Place 1 drop into the right eye in the morning and at bedtime.     natamycin (NATACYN) 5 % ophthalmic suspension Place 1 drop into the left eye as needed (irritation).     NON FORMULARY Place 1 drop into the left eye in the morning, at noon, in the evening, and at bedtime. voriconazole (VFEND) ophthalmic solution 1% Administer 1 drop into the left eye every hour for 14 days. Discard after 7 days and refill for full 14 days.     ondansetron (ZOFRAN) 4 MG tablet Take 1 tablet (4 mg total) by mouth every 8 (eight) hours as needed for nausea or vomiting. 20 tablet 0   PARoxetine (PAXIL) 20 MG tablet Take 20 mg by mouth daily.     PARoxetine (PAXIL) 30 MG tablet Take 1 tablet (30 mg total) by mouth daily. (Patient not taking: Reported on 06/13/2023) 90 tablet 1   prednisoLONE acetate (PRED FORTE) 1 % ophthalmic suspension Place 1 drop into the left eye in the morning and at bedtime.     vitamin k 100 MCG tablet Take 100 mcg by mouth 2 (two) times a week.     No current facility-administered medications for this visit.   Facility-Administered Medications Ordered in Other Visits  Medication Dose Route Frequency Provider Last Rate Last Admin   goserelin (ZOLADEX) injection 3.6 mg  3.6 mg Subcutaneous Once Rickard Patience, MD          .  PHYSICAL EXAMINATION: ECOG PERFORMANCE STATUS: 1 - Symptomatic but completely ambulatory There were no vitals filed for this visit.  Physical Exam Constitutional:  General: She is not in acute distress.    Appearance: She is not diaphoretic.     Comments: Obese.   HENT:     Head: Normocephalic and atraumatic.     Nose: Nose normal.     Mouth/Throat:     Pharynx: No oropharyngeal exudate.  Eyes:     General: No scleral icterus.    Conjunctiva/sclera: Conjunctivae normal.     Pupils: Pupils are equal, round, and reactive to light.  Neck:     Thyroid: No thyromegaly.  Cardiovascular:     Rate and Rhythm: Normal rate and regular rhythm.      Heart sounds: Normal heart sounds. No murmur heard. Pulmonary:     Effort: Pulmonary effort is normal. No respiratory distress.     Breath sounds: Normal breath sounds. No wheezing or rales.  Chest:     Chest wall: No tenderness.  Abdominal:     General: Bowel sounds are normal. There is no distension.     Palpations: Abdomen is soft.     Tenderness: There is no abdominal tenderness.  Musculoskeletal:        General: Normal range of motion.     Cervical back: Normal range of motion and neck supple.  Lymphadenopathy:     Cervical: No cervical adenopathy.  Skin:    General: Skin is warm and dry.     Findings: No erythema or rash.  Neurological:     Mental Status: She is alert and oriented to person, place, and time.     Cranial Nerves: No cranial nerve deficit.     Motor: No abnormal muscle tone.     Coordination: Coordination normal.  Psychiatric:        Mood and Affect: Affect normal.   Declined breast examination.   LABORATORY DATA:  I have reviewed the data as listed: no recent labs.     Latest Ref Rng & Units 06/19/2023    1:14 PM 11/01/2022    3:05 PM 10/17/2022    1:43 PM  CBC  WBC 4.0 - 10.5 K/uL 5.0  5.3  5.3   Hemoglobin 12.0 - 15.0 g/dL 16.1  09.6  04.5   Hematocrit 36.0 - 46.0 % 35.1  39.9  39.5   Platelets 150 - 400 K/uL 257  346  291       Latest Ref Rng & Units 11/01/2022    3:05 PM 10/17/2022    1:43 PM 04/18/2022    9:31 AM  CMP  Glucose 65 - 99 mg/dL 81  97  96   BUN 7 - 25 mg/dL 17  16  20    Creatinine 0.50 - 1.05 mg/dL 4.09  8.11  9.14   Sodium 135 - 146 mmol/L 143  138  141   Potassium 3.5 - 5.3 mmol/L 4.6  4.1  4.4   Chloride 98 - 110 mmol/L 105  106  108   CO2 20 - 32 mmol/L 28  26  28    Calcium 8.6 - 10.4 mg/dL 9.6  9.0  9.2   Total Protein 6.1 - 8.1 g/dL 6.8  6.7  6.9   Total Bilirubin 0.2 - 1.2 mg/dL 0.4  0.3  0.5   Alkaline Phos 38 - 126 U/L  136  111   AST 10 - 35 U/L 17  29  23    ALT 6 - 29 U/L 18  30  27         RADIOGRAPHIC  STUDIES: I have personally reviewed the  radiological images as listed and agreed with the findings in the report. US Abdomen Limited RUQ (LIVER/GB)  Result Date: 06/11/2023 CLINICAL DATA:  Persistent nausea and epigastric pain EXAM: ULTRASOUND ABDOMEN LIMITED RIGHT UPPER QUADRANT COMPARISON:  None Available. FINDINGS: Gallbladder: Distended gallbladder. No wall thickening or adjacent fluid. Gallstones are seen. Common bile duct: Diameter: 4 mm Liver: Heterogeneous hepatic parenchyma. Portal vein is patent on color Doppler imaging with normal direction of blood flow towards the liver. Other: None. IMPRESSION: Distended gallbladder with stones. No further sonographic evidence acute cholecystitis. No ductal dilatation. Heterogeneous hepatic parenchyma. Please correlate for any history of fatty infiltration or chronic liver disease. Electronically Signed   By: Karen Kays M.D.   On: 06/11/2023 10:16   MM 3D SCREENING MAMMOGRAM BILATERAL BREAST  Result Date: 05/29/2023 CLINICAL DATA:  Screening. EXAM: DIGITAL SCREENING BILATERAL MAMMOGRAM WITH TOMOSYNTHESIS AND CAD TECHNIQUE: Bilateral screening digital craniocaudal and mediolateral oblique mammograms were obtained. Bilateral screening digital breast tomosynthesis was performed. The images were evaluated with computer-aided detection. COMPARISON:  Previous exam(s). ACR Breast Density Category b: There are scattered areas of fibroglandular density. FINDINGS: There are no findings suspicious for malignancy. IMPRESSION: No mammographic evidence of malignancy. A result letter of this screening mammogram will be mailed directly to the patient. RECOMMENDATION: Screening mammogram in one year. (Code:SM-B-01Y) BI-RADS CATEGORY  1: Negative. Electronically Signed   By: Annia Belt M.D.   On: 05/29/2023 15:29

## 2023-06-19 NOTE — Assessment & Plan Note (Addendum)
recommend patient to continue calcium and vitamin D supplementation.  06/19/21 DEXA, osteopenia has improved- normalized Repeat DEXA prior to next visit

## 2023-06-20 ENCOUNTER — Ambulatory Visit: Payer: Medicaid Other | Admitting: Anesthesiology

## 2023-06-20 ENCOUNTER — Encounter
Admission: RE | Disposition: A | Discharge status: Home / Self Care | Payer: Self-pay | Source: Home / Self Care | Attending: General Surgery

## 2023-06-20 ENCOUNTER — Other Ambulatory Visit: Payer: Self-pay

## 2023-06-20 ENCOUNTER — Ambulatory Visit
Admission: RE | Admit: 2023-06-20 | Discharge: 2023-06-20 | Disposition: A | Discharge status: Home / Self Care | Payer: Medicaid Other | Attending: General Surgery | Admitting: General Surgery

## 2023-06-20 DIAGNOSIS — K8010 Calculus of gallbladder with chronic cholecystitis without obstruction: Secondary | ICD-10-CM | POA: Diagnosis not present

## 2023-06-20 DIAGNOSIS — Z8041 Family history of malignant neoplasm of ovary: Secondary | ICD-10-CM | POA: Diagnosis not present

## 2023-06-20 DIAGNOSIS — K219 Gastro-esophageal reflux disease without esophagitis: Secondary | ICD-10-CM | POA: Insufficient documentation

## 2023-06-20 DIAGNOSIS — M3500 Sicca syndrome, unspecified: Secondary | ICD-10-CM | POA: Diagnosis not present

## 2023-06-20 DIAGNOSIS — Z79811 Long term (current) use of aromatase inhibitors: Secondary | ICD-10-CM | POA: Insufficient documentation

## 2023-06-20 DIAGNOSIS — K811 Chronic cholecystitis: Secondary | ICD-10-CM

## 2023-06-20 DIAGNOSIS — Z853 Personal history of malignant neoplasm of breast: Secondary | ICD-10-CM | POA: Insufficient documentation

## 2023-06-20 DIAGNOSIS — Z806 Family history of leukemia: Secondary | ICD-10-CM | POA: Diagnosis not present

## 2023-06-20 DIAGNOSIS — Z947 Corneal transplant status: Secondary | ICD-10-CM | POA: Insufficient documentation

## 2023-06-20 DIAGNOSIS — Z800 Family history of malignant neoplasm of digestive organs: Secondary | ICD-10-CM | POA: Insufficient documentation

## 2023-06-20 DIAGNOSIS — Z801 Family history of malignant neoplasm of trachea, bronchus and lung: Secondary | ICD-10-CM | POA: Insufficient documentation

## 2023-06-20 DIAGNOSIS — Z87891 Personal history of nicotine dependence: Secondary | ICD-10-CM | POA: Insufficient documentation

## 2023-06-20 DIAGNOSIS — Z803 Family history of malignant neoplasm of breast: Secondary | ICD-10-CM | POA: Diagnosis not present

## 2023-06-20 DIAGNOSIS — K8020 Calculus of gallbladder without cholecystitis without obstruction: Secondary | ICD-10-CM

## 2023-06-20 SURGERY — CHOLECYSTECTOMY, ROBOT-ASSISTED, LAPAROSCOPIC
Anesthesia: General

## 2023-06-20 MED ORDER — EPHEDRINE 5 MG/ML INJ
INTRAVENOUS | Status: AC
  Filled 2023-06-20: qty 5

## 2023-06-20 MED ORDER — BUPIVACAINE-EPINEPHRINE (PF) 0.5% -1:200000 IJ SOLN
INTRAMUSCULAR | Status: AC
  Filled 2023-06-20: qty 10

## 2023-06-20 MED ORDER — EPHEDRINE SULFATE-NACL 50-0.9 MG/10ML-% IV SOSY
PREFILLED_SYRINGE | INTRAVENOUS | Status: DC | PRN
  Administered 2023-06-20: 5 mg via INTRAVENOUS
  Administered 2023-06-20 (×2): 10 mg via INTRAVENOUS
  Administered 2023-06-20: 5 mg via INTRAVENOUS
  Administered 2023-06-20: 10 mg via INTRAVENOUS

## 2023-06-20 MED ORDER — PHENYLEPHRINE 80 MCG/ML (10ML) SYRINGE FOR IV PUSH (FOR BLOOD PRESSURE SUPPORT)
PREFILLED_SYRINGE | INTRAVENOUS | Status: DC | PRN
  Administered 2023-06-20: 80 ug via INTRAVENOUS
  Administered 2023-06-20: 160 ug via INTRAVENOUS

## 2023-06-20 MED ORDER — ACETAMINOPHEN 10 MG/ML IV SOLN
INTRAVENOUS | Status: DC | PRN
  Administered 2023-06-20: 1000 mg via INTRAVENOUS

## 2023-06-20 MED ORDER — INDOCYANINE GREEN 25 MG IV SOLR
INTRAVENOUS | Status: AC
  Filled 2023-06-20: qty 10

## 2023-06-20 MED ORDER — SEVOFLURANE IN SOLN
RESPIRATORY_TRACT | Status: AC
  Filled 2023-06-20: qty 250

## 2023-06-20 MED ORDER — CEFOTETAN DISODIUM 2 G IJ SOLR
INTRAMUSCULAR | Status: AC
  Filled 2023-06-20: qty 2

## 2023-06-20 MED ORDER — TRAMADOL HCL 50 MG PO TABS
ORAL_TABLET | ORAL | Status: AC
  Filled 2023-06-20: qty 1

## 2023-06-20 MED ORDER — LIDOCAINE HCL (CARDIAC) PF 100 MG/5ML IV SOSY
PREFILLED_SYRINGE | INTRAVENOUS | Status: DC | PRN
  Administered 2023-06-20: 100 mg via INTRAVENOUS

## 2023-06-20 MED ORDER — DEXAMETHASONE SODIUM PHOSPHATE 10 MG/ML IJ SOLN
INTRAMUSCULAR | Status: AC
  Filled 2023-06-20: qty 1

## 2023-06-20 MED ORDER — ONDANSETRON HCL 4 MG/2ML IJ SOLN
INTRAMUSCULAR | Status: DC | PRN
  Administered 2023-06-20: 4 mg via INTRAVENOUS

## 2023-06-20 MED ORDER — ROCURONIUM BROMIDE 10 MG/ML (PF) SYRINGE
PREFILLED_SYRINGE | INTRAVENOUS | Status: AC
  Filled 2023-06-20: qty 10

## 2023-06-20 MED ORDER — SUGAMMADEX SODIUM 200 MG/2ML IV SOLN
INTRAVENOUS | Status: DC | PRN
  Administered 2023-06-20: 200 mg via INTRAVENOUS

## 2023-06-20 MED ORDER — FENTANYL CITRATE (PF) 100 MCG/2ML IJ SOLN
25.0000 ug | INTRAMUSCULAR | Status: DC | PRN
  Administered 2023-06-20 (×2): 50 ug via INTRAVENOUS

## 2023-06-20 MED ORDER — ACETAMINOPHEN 10 MG/ML IV SOLN
INTRAVENOUS | Status: AC
  Filled 2023-06-20: qty 100

## 2023-06-20 MED ORDER — BUPIVACAINE HCL 0.25 % IJ SOLN
INTRAMUSCULAR | Status: DC | PRN
  Administered 2023-06-20: 20 mL

## 2023-06-20 MED ORDER — BUPIVACAINE HCL (PF) 0.25 % IJ SOLN
INTRAMUSCULAR | Status: AC
  Filled 2023-06-20: qty 30

## 2023-06-20 MED ORDER — ROCURONIUM BROMIDE 100 MG/10ML IV SOLN
INTRAVENOUS | Status: DC | PRN
  Administered 2023-06-20: 20 mg via INTRAVENOUS
  Administered 2023-06-20: 50 mg via INTRAVENOUS

## 2023-06-20 MED ORDER — MIDAZOLAM HCL 2 MG/2ML IJ SOLN
INTRAMUSCULAR | Status: DC | PRN
  Administered 2023-06-20: 2 mg via INTRAVENOUS

## 2023-06-20 MED ORDER — PROPOFOL 10 MG/ML IV BOLUS
INTRAVENOUS | Status: AC
  Filled 2023-06-20: qty 20

## 2023-06-20 MED ORDER — FENTANYL CITRATE (PF) 100 MCG/2ML IJ SOLN
INTRAMUSCULAR | Status: AC
  Filled 2023-06-20: qty 2

## 2023-06-20 MED ORDER — ONDANSETRON HCL 4 MG/2ML IJ SOLN
INTRAMUSCULAR | Status: AC
  Filled 2023-06-20: qty 2

## 2023-06-20 MED ORDER — SODIUM CHLORIDE 0.9 % IR SOLN
Status: DC | PRN
  Administered 2023-06-20: 300 mL

## 2023-06-20 MED ORDER — BUPIVACAINE LIPOSOME 1.3 % IJ SUSP
INTRAMUSCULAR | Status: AC
  Filled 2023-06-20: qty 10

## 2023-06-20 MED ORDER — ACETAMINOPHEN 10 MG/ML IV SOLN: 1000.0000 mg | Freq: Once | INTRAVENOUS | Status: DC | PRN

## 2023-06-20 MED ORDER — BUPIVACAINE LIPOSOME 1.3 % IJ SUSP
INTRAMUSCULAR | Status: AC
  Filled 2023-06-20: qty 20

## 2023-06-20 MED ORDER — ONDANSETRON HCL 4 MG/2ML IJ SOLN: 4.0000 mg | Freq: Once | INTRAMUSCULAR | Status: DC | PRN

## 2023-06-20 MED ORDER — PHENYLEPHRINE 80 MCG/ML (10ML) SYRINGE FOR IV PUSH (FOR BLOOD PRESSURE SUPPORT)
PREFILLED_SYRINGE | INTRAVENOUS | Status: AC
  Filled 2023-06-20: qty 10

## 2023-06-20 MED ORDER — MIDAZOLAM HCL 2 MG/2ML IJ SOLN
INTRAMUSCULAR | Status: AC
  Filled 2023-06-20: qty 2

## 2023-06-20 MED ORDER — CHLORHEXIDINE GLUCONATE 0.12 % MT SOLN
OROMUCOSAL | Status: AC
  Filled 2023-06-20: qty 15

## 2023-06-20 MED ORDER — LACTATED RINGERS IV SOLN
INTRAVENOUS | Status: AC
Start: 2023-06-20 — End: 2023-06-20

## 2023-06-20 MED ORDER — SODIUM CHLORIDE 0.9 % IV SOLN
INTRAVENOUS | Status: DC | PRN
  Administered 2023-06-20: 60 mL

## 2023-06-20 MED ORDER — LIDOCAINE HCL (PF) 2 % IJ SOLN
INTRAMUSCULAR | Status: AC
  Filled 2023-06-20: qty 5

## 2023-06-20 MED ORDER — SODIUM CHLORIDE FLUSH 0.9 % IV SOLN
INTRAVENOUS | Status: AC
  Filled 2023-06-20: qty 40

## 2023-06-20 MED ORDER — DEXMEDETOMIDINE HCL IN NACL 80 MCG/20ML IV SOLN
INTRAVENOUS | Status: DC | PRN
  Administered 2023-06-20: 8 ug via INTRAVENOUS
  Administered 2023-06-20: 4 ug via INTRAVENOUS

## 2023-06-20 MED ORDER — FENTANYL CITRATE (PF) 100 MCG/2ML IJ SOLN
INTRAMUSCULAR | Status: DC | PRN
  Administered 2023-06-20 (×2): 50 ug via INTRAVENOUS

## 2023-06-20 MED ORDER — TRAMADOL HCL 50 MG PO TABS
50.0000 mg | ORAL_TABLET | Freq: Once | ORAL | Status: AC
  Administered 2023-06-20: 50 mg via ORAL

## 2023-06-20 MED ORDER — TRAMADOL HCL 50 MG PO TABS: 50.0000 mg | ORAL_TABLET | Freq: Four times a day (QID) | ORAL | 0 refills | Status: AC | PRN

## 2023-06-20 MED ORDER — PROPOFOL 10 MG/ML IV BOLUS
INTRAVENOUS | Status: DC | PRN
  Administered 2023-06-20: 130 mg via INTRAVENOUS

## 2023-06-20 MED ORDER — ARTIFICIAL TEARS OPHTHALMIC OINT
TOPICAL_OINTMENT | OPHTHALMIC | Status: AC
  Filled 2023-06-20: qty 3.5

## 2023-06-20 MED ORDER — DEXAMETHASONE SODIUM PHOSPHATE 10 MG/ML IJ SOLN
INTRAMUSCULAR | Status: DC | PRN
  Administered 2023-06-20: 10 mg via INTRAVENOUS

## 2023-06-20 SURGICAL SUPPLY — 49 items
ADH SKN CLS APL DERMABOND .7 (GAUZE/BANDAGES/DRESSINGS) ×1
BAG PRESSURE INF REUSE 1000 (BAG) IMPLANT
CANNULA REDUCER 12-8 DVNC XI (CANNULA) ×1 IMPLANT
CAUTERY HOOK MNPLR 1.6 DVNC XI (INSTRUMENTS) ×1 IMPLANT
CLIP LIGATING HEMO O LOK GREEN (MISCELLANEOUS) ×1 IMPLANT
CNTNR URN SCR LID CUP LEK RST (MISCELLANEOUS) IMPLANT
CONT SPEC 4OZ STRL OR WHT (MISCELLANEOUS) ×1
DERMABOND ADVANCED .7 DNX12 (GAUZE/BANDAGES/DRESSINGS) ×1 IMPLANT
DRAPE ARM DVNC X/XI (DISPOSABLE) ×4 IMPLANT
DRAPE COLUMN DVNC XI (DISPOSABLE) ×1 IMPLANT
ELECT REM PT RETURN 9FT ADLT (ELECTROSURGICAL) ×1
ELECTRODE REM PT RTRN 9FT ADLT (ELECTROSURGICAL) ×1 IMPLANT
FORCEPS BPLR 8 MD DVNC XI (FORCEP) IMPLANT
FORCEPS BPLR R/ABLATION 8 DVNC (INSTRUMENTS) ×1 IMPLANT
FORCEPS PROGRASP DVNC XI (FORCEP) ×1 IMPLANT
GLOVE BIOGEL PI IND STRL 7.5 (GLOVE) ×2 IMPLANT
GLOVE SURG SYN 7.0 (GLOVE) ×3
GLOVE SURG SYN 7.0 PF PI (GLOVE) ×2 IMPLANT
GOWN STRL REUS W/ TWL LRG LVL3 (GOWN DISPOSABLE) ×3 IMPLANT
GOWN STRL REUS W/TWL LRG LVL3 (GOWN DISPOSABLE) ×3
GRASPER SUT TROCAR 14GX15 (MISCELLANEOUS) ×1 IMPLANT
IRRIGATOR SUCT 8 DISP DVNC XI (IRRIGATION / IRRIGATOR) IMPLANT
IV NS 1000ML (IV SOLUTION) ×1
IV NS 1000ML BAXH (IV SOLUTION) IMPLANT
KIT PINK PAD W/HEAD ARE REST (MISCELLANEOUS) ×1
KIT PINK PAD W/HEAD ARM REST (MISCELLANEOUS) ×1 IMPLANT
LABEL OR SOLS (LABEL) ×1 IMPLANT
MANIFOLD NEPTUNE II (INSTRUMENTS) ×1 IMPLANT
NDL HYPO 22X1.5 SAFETY MO (MISCELLANEOUS) ×1 IMPLANT
NDL INSUFFLATION 14GA 120MM (NEEDLE) ×1 IMPLANT
NEEDLE HYPO 22X1.5 SAFETY MO (MISCELLANEOUS) ×1
NEEDLE INSUFFLATION 14GA 120MM (NEEDLE) ×1
NS IRRIG 500ML POUR BTL (IV SOLUTION) ×1 IMPLANT
OBTURATOR OPTICAL STND 8 DVNC (TROCAR) ×1
OBTURATOR OPTICALSTD 8 DVNC (TROCAR) ×1 IMPLANT
PACK LAP CHOLECYSTECTOMY (MISCELLANEOUS) ×1 IMPLANT
SEAL UNIV 5-12 XI (MISCELLANEOUS) ×4 IMPLANT
SET TUBE SMOKE EVAC HIGH FLOW (TUBING) ×1 IMPLANT
SOL ELECTROSURG ANTI STICK (MISCELLANEOUS) ×1
SOLUTION ELECTROSURG ANTI STCK (MISCELLANEOUS) ×1 IMPLANT
SPIKE FLUID TRANSFER (MISCELLANEOUS) ×2 IMPLANT
SPONGE T-LAP 4X18 ~~LOC~~+RFID (SPONGE) IMPLANT
SUT MNCRL 4-0 (SUTURE) ×1
SUT MNCRL 4-0 27XMFL (SUTURE) ×1
SUT VICRYL 0 UR6 27IN ABS (SUTURE) ×1 IMPLANT
SUTURE MNCRL 4-0 27XMF (SUTURE) ×1 IMPLANT
SYS BAG RETRIEVAL 10MM (BASKET) ×1
SYSTEM BAG RETRIEVAL 10MM (BASKET) ×1 IMPLANT
WATER STERILE IRR 500ML POUR (IV SOLUTION) ×1 IMPLANT

## 2023-06-20 NOTE — Anesthesia Postprocedure Evaluation (Signed)
Anesthesia Post Note  Patient: Sheena Martinez  Procedure(s) Performed: XI ROBOTIC ASSISTED LAPAROSCOPIC CHOLECYSTECTOMY INDOCYANINE GREEN FLUORESCENCE IMAGING (ICG)  Patient location during evaluation: PACU Anesthesia Type: General Level of consciousness: awake and alert, oriented and patient cooperative Pain management: pain level controlled Vital Signs Assessment: post-procedure vital signs reviewed and stable Respiratory status: spontaneous breathing, nonlabored ventilation and respiratory function stable Cardiovascular status: blood pressure returned to baseline and stable Postop Assessment: adequate PO intake Anesthetic complications: yes   Encounter Notable Events  Notable Event Outcome Phase Comment  Difficult to intubate - expected  Intraprocedure Filed from anesthesia note documentation.     Last Vitals:  Vitals:   06/20/23 0930 06/20/23 0945  BP: (!) 134/59 129/67  Pulse: 86 84  Resp: 16 13  Temp:    SpO2: 98% 99%    Last Pain:  Vitals:   06/20/23 0945  TempSrc:   PainSc: 6                  Reed Breech

## 2023-06-20 NOTE — H&P (Signed)
No changes to H and P, proceed as planned with robotic cholecystectomy.   CC: RUQ pain History of Present Illness Sheena Martinez is a 62 y.o. female who presents evaluation for symptomatic cholelithiasis.  The patient reports that over the last 2 weeks she has had postprandial epigastric pain.  This has happened with every meal.  She says the pain last for a couple of hours and then goes away.  She has been nauseated but denies any vomiting, fevers or chills.  She had an ultrasound that showed symptomatic cholelithiasis.   Of note the patient is on anastrozole due to history of breast cancer.   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   Cataract 01/11/2020    left   Corneal perforation of left eye 10/09/2017    Overview:  Added automatically from request for surgery [id number]   GERD (gastroesophageal reflux disease)      OCC   Goiter     Hematuria 10/21/2018   Hyperthyroidism     Personal history of radiation therapy 2018    LEFT lumpectomy   Sjogren's syndrome (HCC)     Uses continuous positive airway pressure (CPAP) ventilation at home 04/30/2019                 Past Surgical History:  Procedure Laterality Date   BREAST BIOPSY Left 05/12/2017    [place] bx/ INVASIVE MAMMARY CARCINOMA   BREAST LUMPECTOMY Left 05/2017    invasive mammary carcinoma, neg margins   BREAST LUMPECTOMY WITH SENTINEL LYMPH NODE BIOPSY Left 06/10/2017    Procedure: BREAST LUMPECTOMY WITH SENTINEL LYMPH NODE BX AND NEEDLE LOCALIZATION;  Surgeon: Earline Mayotte, MD;  Location: [facility];  Service: General;  Laterality: Left;   BREAST MAMMOSITE Left 06/24/2017    Procedure: MAMMOSITE BREAST;  Surgeon: Earline Mayotte, MD;  Location: [facility];  Service: General;  Laterality: Left;   CESAREAN SECTION   1995   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    [facility]    COSMETIC SURGERY       CYST EXCISION   2018    pilar cyst/ Dr Orma Flaming   EYE SURGERY       PORTACATH PLACEMENT Right 07/08/2017    Procedure: INSERTION PORT-A-CATH;  Surgeon: Earline Mayotte, MD;  Location: [facility];  Service: General;  Laterality: Right;   TONSILLECTOMY              Allergies       Allergies  Allergen Reactions   Rituximab Anaphylaxis   Benadryl [Diphenhydramine] Other (See Comments)      Patient felt like she was going to "crawl out of her skin"   Clonazepam Anxiety and Other (See Comments)   Cortisone Other (See Comments)      Also reacts to cortisone injections; they make her flushed   Diphenhydramine Hcl     Sulfamethoxazole-Trimethoprim Rash      Chest tightness. Bactrim              Current Outpatient Medications  Medication Sig Dispense Refill   acetaminophen (TYLENOL) 500 MG tablet Take 1,000 mg by mouth every 8 (eight) hours as needed for mild pain or headache.       anastrozole (ARIMIDEX) 1 MG tablet Take 1 tablet (1 mg total) by  mouth daily. 90 tablet 1   busPIRone (BUSPAR) 10 MG tablet Take 1 tablet (10 mg total) by mouth 3 (three) times daily. 90 tablet 1   Ca Carbonate-Mag Hydroxide (ROLAIDS PO) Take 1 tablet by mouth as needed (indigestion).       Chlorhexidine Gluconate 2 % LIQD Apply 15 mLs topically daily. 150 mL 0   cholecalciferol (VITAMIN D) 1000 units tablet Take 2,000 Units by mouth daily.       diclofenac (VOLTAREN) 50 MG EC tablet Take 1 tablet by mouth 2 (two) times daily with a meal.       diclofenac Sodium (VOLTAREN) 1 % GEL Apply 2 g topically 4 (four) times daily. 100 g 3   ketoconazole (NIZORAL) 2 % cream Apply 1 application topically daily.       Light Mineral Oil-Mineral Oil 0.5-0.5 % EMUL Apply to eye.       LORazepam (ATIVAN) 0.5 MG tablet May take 0.5-1 mg (1 to 2 tablets) po daily prn for severe stress/anxiety symptoms, use sparingly, not for daily use, rx to last more than 3 months, no refills 20 tablet 0   loteprednol  (LOTEMAX) 0.5 % ophthalmic suspension SMARTSIG:In Eye(s)       moxifloxacin (VIGAMOX) 0.5 % ophthalmic solution Place 1 drop into the left eye 4 (four) times daily.       natamycin (NATACYN) 5 % ophthalmic suspension Place 1 drop into the left eye every 2 (two) hours while awake.       NON FORMULARY voriconazole (VFEND) ophthalmic solution 1 Administer 1 drop into the left eye every hour for 14 days. Discard after 7 days and refill for full 14 days.       ofloxacin (OCUFLOX) 0.3 % ophthalmic solution Place 1 drop into the right eye 4 (four) times daily.       ondansetron (ZOFRAN) 4 MG tablet Take 1 tablet (4 mg total) by mouth every 8 (eight) hours as needed for nausea or vomiting. 20 tablet 0   PARoxetine (PAXIL) 30 MG tablet Take 1 tablet (30 mg total) by mouth daily. 90 tablet 1   prednisoLONE acetate (PRED FORTE) 1 % ophthalmic suspension         vitamin k 100 MCG tablet Take 100 mcg by mouth 2 (two) times a week.       Water For Injection Sterile (STERILE WATER, PRESERVATIVE FREE,) injection            No current facility-administered medications for this visit.             Facility-Administered Medications Ordered in Other Visits  Medication Dose Route Frequency Provider Last Rate Last Admin   goserelin (ZOLADEX) injection 3.6 mg  3.6 mg Subcutaneous Once Rickard Patience, MD            Family History      Family History  Problem Relation Age of Onset   Breast cancer Maternal Aunt 45        currently ~65   Diabetes Mother     Skin cancer Mother          currently 19; TAH/BSO (age?)   Anemia Mother     Liver disease Father          deceased / not much info about him / alcoholic   Lung cancer Paternal Aunt          smoker / deceased 2s   Stomach cancer Paternal Uncle 69        deceased /  deceased 45s   Ovarian cancer Maternal Grandmother 11        deceased 90s   Stomach cancer Paternal Grandfather 6        deceased 74s   Cancer Maternal Uncle          unk. type; deceased 68s    Leukemia Cousin 35        deceased 30            Social History Social History  Social History         Tobacco Use   Smoking status: Former      Current packs/day: 0.00      Types: Cigarettes      Start date: 08/12/1972      Quit date: 08/13/1979      Years since quitting: 43.8   Smokeless tobacco: Never   Tobacco comments:      AGE [age]  Vaping Use   Vaping status: Never Used  Substance Use Topics   Alcohol use: No   Drug use: No            ROS Full ROS of systems performed and is otherwise negative there than what is stated in the HPI   Physical Exam Blood pressure 119/67, pulse 67, temperature 98.5 F (36.9 C), temperature source Oral, height 5' (1.524 m), weight 173 lb 12.8 oz (78.8 kg), SpO2 96%.   No acute distress, alert and oriented x 3, normal work of breathing on room air, moving all extremities spontaneously, abdomen is soft, tender to palpation in the right upper quadrant without Murphy sign. Data Reviewed Ultrasound revealed cholelithiasis   I have personally reviewed the patient's imaging and medical records.     Assessment   Assessment 62 year old with symptomatic cholelithiasis   Plan     Plan We will plan for robotic assisted cholecystectomy.  I discussed the risk, benefits alternatives of the procedure including risk of infection, bleeding, retained stone, bile leak, injury to the common bile duct and need for open procedure.  The patient understands these risks and wishes to proceed with surgery.

## 2023-06-20 NOTE — Op Note (Signed)
Robotic assisted laparoscopic Cholecystectomy  Pre-operative Diagnosis: Chronic CHolecystitis  Post-operative Diagnosis: Same  Procedure:  Robotic assisted laparoscopic Cholecystectomy  Surgeon: Baker Pierini, MD  Anesthesia: Gen. with endotracheal tube  Findings: Chronically inflamed gallbladder with omental adhesions to gallbladder and liver edge  Estimated Blood Loss: 25cc       Specimens: Gallbladder           Complications: none   Procedure Details  The patient was seen again in the Holding Room. The benefits, complications, treatment options, and expected outcomes were discussed with the patient. The risks of bleeding, infection, recurrence of symptoms, failure to resolve symptoms, bile duct damage, bile duct leak, retained common bile duct stone, bowel injury, any of which could require further surgery and/or ERCP, stent, or papillotomy were reviewed with the patient. The likelihood of improving the patient's symptoms with return to their baseline status is good.  The patient and/or family concurred with the proposed plan, giving informed consent.  The patient was taken to Operating Room, identified  and the procedure verified as robotic Cholecystectomy.  A Time Out was held and the above information confirmed.  Prior to the induction of general anesthesia, antibiotic prophylaxis was administered. VTE prophylaxis was in place. General endotracheal anesthesia was then administered and tolerated well. After the induction, the abdomen was prepped with Chloraprep and draped in the sterile fashion. The patient was positioned in the supine position.  A veress needle was inserted into the abdomen using standard drop technique. An 8mm infra-umbilical robotic port was then placed under direct visualization. There was no injury noted at the site of veress needle insertion. Bilateral TAP blocks were performed using marcaine-bupivicaine solution. Two right sided abdominal 8mm ports followed  by an 8mm left abdominal robotic ports were placed under direct visualization. The left sided abdominal port was then upsized to a 12mm robotic port.  The patient was positioned  in reverse Trendelenburg, robot was brought to the surgical field and docked in the standard fashion.  We made sure all the instrumentation was kept indirect view at all times and that there were no collision between the arms. I scrubbed out and went to the console.  The gallbladder was identified, the fundus grasped and retracted cephalad. Adhesions were lysed bluntly. The infundibulum was grasped and retracted laterally, exposing the peritoneum overlying the triangle of Calot. This was then divided and exposed in a blunt fashion. An extended critical view of the cystic duct and cystic artery was obtained.  The cystic duct was clearly identified and bluntly dissected.   Artery and duct were double clipped and divided. Using ICG cholangiography we visualized the cystic duct. The gallbladder was taken from the gallbladder fossa in a retrograde fashion with the electrocautery.  Hemostasis was achieved with the electrocautery. nspection of the right upper quadrant was performed. No bleeding, bile duct injury or leak, or bowel injury was noted. Robotic instruments and robotic arms were undocked in the standard fashion.  I scrubbed back in.  The gallbladder was removed and placed in an Endocatch bag.   The left lower quadrant fascia was then closed with a 0 vicryl using a suture needle passer.  Pneumoperitoneum was released.  4-0 subcuticular Monocryl was used to close the skin. Dermabond was  applied.  The patient was then extubated and brought to the recovery room in stable condition. Sponge, lap, and needle counts were correct at closure and at the conclusion of the case.  Baker Pierini, M.D. [facility]

## 2023-06-20 NOTE — Transfer of Care (Signed)
Immediate Anesthesia Transfer of Care Note  Patient: Sheena Martinez  Procedure(s) Performed: XI ROBOTIC ASSISTED LAPAROSCOPIC CHOLECYSTECTOMY INDOCYANINE GREEN FLUORESCENCE IMAGING (ICG)  Patient Location: PACU  Anesthesia Type:General  Level of Consciousness: drowsy and patient cooperative  Airway & Oxygen Therapy: Patient Spontanous Breathing  Post-op Assessment: Report given to RN and Post -op Vital signs reviewed and stable  Post vital signs: Reviewed and stable  Last Vitals:  Vitals Value Taken Time  BP 134/59 06/20/23 0930  Temp 36.3 C 06/20/23 0926  Pulse 87 06/20/23 0934  Resp 15 06/20/23 0934  SpO2 98 % 06/20/23 0934  Vitals shown include unfiled device data.  Last Pain:  Vitals:   06/20/23 0926  TempSrc:   PainSc: Asleep         Complications:  Encounter Notable Events  Notable Event Outcome Phase Comment  Difficult to intubate - expected  Intraprocedure Filed from anesthesia note documentation.

## 2023-06-20 NOTE — Anesthesia Procedure Notes (Addendum)
Procedure Name: Intubation Date/Time: 06/20/2023 7:47 AM  Performed by: Omer Jack, CRNAPre-anesthesia Checklist: Patient identified, Patient being monitored, Timeout performed, Emergency Drugs available and Suction available Patient Re-evaluated:Patient Re-evaluated prior to induction Oxygen Delivery Method: Circle system utilized Preoxygenation: Pre-oxygenation with 100% oxygen Induction Type: IV induction Ventilation: Mask ventilation without difficulty Laryngoscope Size: 3 and McGraph Grade View: Grade III Tube type: Oral Tube size: 7.0 mm Number of attempts: 1 Airway Equipment and Method: Stylet Placement Confirmation: ETT inserted through vocal cords under direct vision, positive ETCO2 and breath sounds checked- equal and bilateral Secured at: 21 cm Tube secured with: Tape Dental Injury: Teeth and Oropharynx as per pre-operative assessment  Difficulty Due To: Difficulty was anticipated and Difficult Airway- due to anterior larynx Comments: Unable to lift epiglottis with Mac blade. Grade III view Mcgrath. 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

## 2023-06-23 ENCOUNTER — Ambulatory Visit: Payer: Medicaid Other | Admitting: Physician Assistant

## 2023-06-23 LAB — SURGICAL PATHOLOGY

## 2023-06-25 DIAGNOSIS — B49 Unspecified mycosis: Secondary | ICD-10-CM | POA: Diagnosis not present

## 2023-06-25 DIAGNOSIS — B470 Eumycetoma: Secondary | ICD-10-CM | POA: Diagnosis not present

## 2023-07-03 ENCOUNTER — Encounter: Payer: Self-pay | Admitting: General Surgery

## 2023-07-03 ENCOUNTER — Ambulatory Visit: Payer: Medicaid Other | Admitting: General Surgery

## 2023-07-03 VITALS — BP 121/75 | HR 66 | Temp 98.3°F | Ht 60.0 in | Wt 175.0 lb

## 2023-07-03 DIAGNOSIS — K811 Chronic cholecystitis: Secondary | ICD-10-CM

## 2023-07-03 DIAGNOSIS — Z09 Encounter for follow-up examination after completed treatment for conditions other than malignant neoplasm: Secondary | ICD-10-CM

## 2023-07-03 NOTE — Progress Notes (Signed)
Patient returns today status post robotic assisted cholecystectomy on 8 November.  She reports that initially she had a little bit of pain but that has mostly resolved.  She is not requiring any more narcotics.  She is tolerating a regular diet and having normal bowel function.  She denies any drainage or redness around her incisions  Abdomen is soft, mildly tender over the left incision.  The incisions are without erythema and there is still glue on most of the incisions.  Pathology discussed with the patient and consistent with chronic cholecystitis.  No immediate postoperative concerns.  Continue with lifting restrictions for 2 more weeks.  Can follow-up as needed

## 2023-07-03 NOTE — Patient Instructions (Signed)

## 2023-07-08 DIAGNOSIS — F411 Generalized anxiety disorder: Secondary | ICD-10-CM | POA: Diagnosis not present

## 2023-07-22 DIAGNOSIS — L820 Inflamed seborrheic keratosis: Secondary | ICD-10-CM | POA: Diagnosis not present

## 2023-07-22 DIAGNOSIS — L821 Other seborrheic keratosis: Secondary | ICD-10-CM | POA: Diagnosis not present

## 2023-07-22 DIAGNOSIS — L918 Other hypertrophic disorders of the skin: Secondary | ICD-10-CM | POA: Diagnosis not present

## 2023-07-22 DIAGNOSIS — L814 Other melanin hyperpigmentation: Secondary | ICD-10-CM | POA: Diagnosis not present

## 2023-07-22 DIAGNOSIS — L658 Other specified nonscarring hair loss: Secondary | ICD-10-CM | POA: Diagnosis not present

## 2023-07-22 DIAGNOSIS — D229 Melanocytic nevi, unspecified: Secondary | ICD-10-CM | POA: Diagnosis not present

## 2023-08-12 ENCOUNTER — Encounter: Payer: Self-pay | Admitting: Physician Assistant

## 2023-08-12 ENCOUNTER — Ambulatory Visit (INDEPENDENT_AMBULATORY_CARE_PROVIDER_SITE_OTHER): Payer: Medicaid Other | Admitting: Physician Assistant

## 2023-08-12 VITALS — BP 124/72 | HR 71 | Resp 16 | Ht 60.0 in | Wt 181.0 lb

## 2023-08-12 DIAGNOSIS — R3915 Urgency of urination: Secondary | ICD-10-CM | POA: Diagnosis not present

## 2023-08-12 DIAGNOSIS — R300 Dysuria: Secondary | ICD-10-CM

## 2023-08-12 DIAGNOSIS — N390 Urinary tract infection, site not specified: Secondary | ICD-10-CM

## 2023-08-12 LAB — POCT URINALYSIS DIPSTICK
Appearance: NORMAL
Bilirubin, UA: NEGATIVE
Blood, UA: NEGATIVE
Glucose, UA: NEGATIVE
Ketones, UA: NEGATIVE
Nitrite, UA: NEGATIVE
Protein, UA: NEGATIVE
Spec Grav, UA: 1.02 (ref 1.010–1.025)
Urobilinogen, UA: 0.2 U/dL
pH, UA: 6 (ref 5.0–8.0)

## 2023-08-12 MED ORDER — NITROFURANTOIN MONOHYD MACRO 100 MG PO CAPS: 100.0000 mg | ORAL_CAPSULE | Freq: Two times a day (BID) | ORAL | 0 refills | Status: AC

## 2023-08-12 NOTE — Progress Notes (Signed)
 Acute Office Visit   Patient: Sheena Martinez   DOB: Aug 01, 1961   62 y.o. Female  MRN: [id number] Visit Date: 08/12/2023  Today's healthcare provider: Rocky BRAVO Irving Lubbers, PA-C  Introduced myself to the patient as a PA-C and provided education on APPs in clinical practice.    Chief Complaint  Patient presents with   Urinary Frequency    Since the weekend   Subjective    HPI HPI     Urinary Frequency    Additional comments: Since the weekend      Last edited by Dann Kirsch, CMA on 08/12/2023  9:40 AM.       Concern for UTI   Onset: sudden  Duration: ongoing since the weekend  Associated symptoms: she reports increased urinary urgency, suprapubic pressure   She denies flank pain, fever, chills, suprapubic pain  Interventions; nothing      Medications: Outpatient Medications Prior to Visit  Medication Sig   acetaminophen  (TYLENOL ) 500 MG tablet Take 1,000 mg by mouth every 8 (eight) hours as needed for mild pain or headache.   anastrozole  (ARIMIDEX ) 1 MG tablet Take 1 tablet (1 mg total) by mouth daily.   Ca Carbonate-Mag Hydroxide (ROLAIDS PO) Take 1 tablet by mouth as needed (indigestion).   Cholecalciferol (VITAMIN D ) 50 MCG (2000 UT) tablet Take 2,000 Units by mouth daily.   cycloSPORINE 0.1 % SOLN Place 1 drop into the left eye in the morning, at noon, in the evening, and at bedtime.   diclofenac  (VOLTAREN ) 50 MG EC tablet Take 50 mg by mouth 2 (two) times daily with a meal.   hydroxypropyl methylcellulose / hypromellose (ISOPTO TEARS / GONIOVISC) 2.5 % ophthalmic solution Place 1 drop into both eyes as needed for dry eyes.   ketoconazole  (NIZORAL ) 2 % cream Apply 1 Application topically daily as needed for irritation.   ketoconazole  (NIZORAL ) 2 % shampoo Apply 1 Application topically once a week.   LORazepam  (ATIVAN ) 0.5 MG tablet May take 0.5-1 mg (1 to 2 tablets) po daily prn for severe stress/anxiety symptoms, use sparingly, not for daily use, rx to  last more than 3 months, no refills   loteprednol (LOTEMAX) 0.5 % ophthalmic suspension Place 1 drop into the right eye in the morning and at bedtime.   natamycin (NATACYN) 5 % ophthalmic suspension Place 1 drop into the left eye as needed (irritation).   NON FORMULARY Place 1 drop into the left eye in the morning, at noon, in the evening, and at bedtime. voriconazole (VFEND) ophthalmic solution 1% Administer 1 drop into the left eye every hour for 14 days. Discard after 7 days and refill for full 14 days.   ondansetron  (ZOFRAN ) 4 MG tablet Take 1 tablet (4 mg total) by mouth every 8 (eight) hours as needed for nausea or vomiting.   PARoxetine  (PAXIL ) 20 MG tablet Take 20 mg by mouth daily.   prednisoLONE acetate (PRED FORTE) 1 % ophthalmic suspension Place 1 drop into the left eye in the morning and at bedtime.   traMADol  (ULTRAM ) 50 MG tablet Take 1 tablet (50 mg total) by mouth every 6 (six) hours as needed.   vitamin k 100 MCG tablet Take 100 mcg by mouth 2 (two) times a week.   Facility-Administered Medications Prior to Visit  Medication Dose Route Frequency Provider   goserelin (ZOLADEX ) injection 3.6 mg  3.6 mg Subcutaneous Once Yu, Zhou, MD    Review of Systems  Constitutional:  Negative for chills and fever.  Genitourinary:  Positive for frequency and urgency. Negative for decreased urine volume, difficulty urinating, dyspareunia, dysuria, flank pain, hematuria, vaginal bleeding, vaginal discharge and vaginal pain.        Objective    BP 124/72   Pulse 71   Resp 16   Ht 5' (1.524 m)   Wt 181 lb (82.1 kg)   LMP 09/13/2019 (LMP Unknown)   SpO2 98%   BMI 35.35 kg/m     Physical Exam Vitals reviewed.  Constitutional:      General: She is awake.     Appearance: Normal appearance. She is well-developed and well-groomed.  HENT:     Head: Normocephalic and atraumatic.  Eyes:     General: Lids are normal. Gaze aligned appropriately.     Extraocular Movements: Extraocular  movements intact.     Conjunctiva/sclera: Conjunctivae normal.  Pulmonary:     Effort: Pulmonary effort is normal.  Neurological:     General: No focal deficit present.     Mental Status: She is alert and oriented to person, place, and time.     GCS: GCS eye subscore is 4. GCS verbal subscore is 5. GCS motor subscore is 6.     Cranial Nerves: No cranial nerve deficit, dysarthria or facial asymmetry.  Psychiatric:        Attention and Perception: Attention and perception normal.        Mood and Affect: Mood and affect normal.        Speech: Speech normal.        Behavior: Behavior normal. Behavior is cooperative.       Results for orders placed or performed in visit on 08/12/23  POCT urinalysis dipstick  Result Value Ref Range   Color, UA Yellow    Clarity, UA Clear    Glucose, UA Negative Negative   Bilirubin, UA Negative    Ketones, UA Negative    Spec Grav, UA 1.020 1.010 - 1.025   Blood, UA Negative    pH, UA 6.0 5.0 - 8.0   Protein, UA Negative Negative   Urobilinogen, UA 0.2 0.2 or 1.0 E.U./dL   Nitrite, UA Negative    Leukocytes, UA Small (1+) (A) Negative   Appearance Normal    Odor None     Assessment & Plan      No follow-ups on file.      Problem List Items Addressed This Visit   None Visit Diagnoses       Urinary tract infection without hematuria, site unspecified    -  Primary   Relevant Medications   nitrofurantoin , macrocrystal-monohydrate, (MACROBID ) 100 MG capsule     Urinary urgency       Relevant Orders   Urine Culture   POCT urinalysis dipstick (Completed)      Acute, new problem Patient reports symptoms comprised of the following:  increased urinary frequency, urinary urgency since the weekend  Results of UA are consistent with UTI - urine sample sent for culture to determine causative organism and susceptibility- results to dictate further management  Urine dip results reviewed with patient during apt  Will provide script for Macrobid   100 mg PO BID x 5 days - discussed importance of finishing entire course of abx and staying well hydrated while recovering from UTI Reviewed ED precautions with patient Follow up as needed for persistent or worsening symptoms   No follow-ups on file.   I, Abdulaziz Toman E Jenavie Stanczak, PA-C, have reviewed all documentation  for this visit. The documentation on 08/12/23 for the exam, diagnosis, procedures, and orders are all accurate and complete.   [place], MHS, PA-C [facility]

## 2023-08-14 LAB — URINE CULTURE
MICRO NUMBER:: 15906458
SPECIMEN QUALITY:: ADEQUATE

## 2023-08-18 ENCOUNTER — Telehealth: Payer: Self-pay

## 2023-08-18 MED ORDER — AMOXICILLIN 875 MG PO TABS: 875.0000 mg | ORAL_TABLET | Freq: Two times a day (BID) | ORAL | 0 refills | Status: AC

## 2023-08-18 NOTE — Addendum Note (Signed)
 Addended by: Jacquelin Hawking on: 08/18/2023 12:40 PM   Modules accepted: Orders

## 2023-08-18 NOTE — Telephone Encounter (Signed)
 Copied from CRM [id number]. Topic: General - Inquiry >> Aug 18, 2023 11:55 AM Clide Dales wrote: Patient called to get the results of her urinalysis. Patient states that she has finished the antibiotics but is not feeling better. Please advise.

## 2023-08-18 NOTE — Telephone Encounter (Signed)
 Amoxicillin sent in

## 2023-08-18 NOTE — Progress Notes (Signed)
 Your urine culture results are back. Your urine culture demonstrated infection with a Streptococcus bacteria.  I have sent in a new antibiotic for you to take to help manage your symptoms.  Please stop the Macrobid  and start the new antibiotic as directed.  Please let us  know if you have further questions or concerns

## 2023-09-07 ENCOUNTER — Other Ambulatory Visit: Payer: Self-pay | Admitting: Oncology

## 2023-09-15 ENCOUNTER — Encounter: Payer: Self-pay | Admitting: Oncology

## 2023-09-15 ENCOUNTER — Other Ambulatory Visit: Payer: Self-pay

## 2023-09-15 DIAGNOSIS — M3501 Sicca syndrome with keratoconjunctivitis: Secondary | ICD-10-CM | POA: Diagnosis not present

## 2023-09-15 DIAGNOSIS — H16002 Unspecified corneal ulcer, left eye: Secondary | ICD-10-CM | POA: Diagnosis not present

## 2023-09-30 DIAGNOSIS — F411 Generalized anxiety disorder: Secondary | ICD-10-CM | POA: Diagnosis not present

## 2023-10-13 DIAGNOSIS — N3000 Acute cystitis without hematuria: Secondary | ICD-10-CM | POA: Diagnosis not present

## 2023-10-13 DIAGNOSIS — R350 Frequency of micturition: Secondary | ICD-10-CM | POA: Diagnosis not present

## 2023-11-17 ENCOUNTER — Telehealth: Payer: Self-pay | Admitting: Family Medicine

## 2023-11-17 NOTE — Telephone Encounter (Signed)
 Copied from CRM [id number]. Topic: Referral - Request for Referral >> Nov 17, 2023  3:07 PM Turkey B wrote: Did the patient discuss referral with their provider in the last year? yes   Type of order/referral and detailed reason for visit: recurring UTI's  Preference of office, provider, location: ?  If referral order, have you been seen by this specialty before? no  Can we respond through MyChart? yes

## 2023-11-18 NOTE — Telephone Encounter (Signed)
 Pt is scheduled

## 2023-11-19 NOTE — Progress Notes (Unsigned)
   LMP 09/13/2019 (LMP Unknown)    Subjective:    Patient ID: Sheena Martinez, female    DOB: 31-Oct-1960, 63 y.o.   MRN: [id number]  HPI: Sheena Martinez is a 63 y.o. female  No chief complaint on file.   Discussed the use of AI scribe software for clinical note transcription with the patient, who gave verbal consent to proceed.  History of Present Illness          06/09/2023    3:11 PM 11/01/2022    1:59 PM 11/01/2022    1:58 PM  Depression screen PHQ 2/9  Decreased Interest 0 1 1  Down, Depressed, Hopeless 0 3 2  PHQ - 2 Score 0 4 3  Altered sleeping 0 1   Tired, decreased energy 0 1   Change in appetite 0 0   Feeling bad or failure about yourself  0 0   Trouble concentrating 0 0   Moving slowly or fidgety/restless 0 0   Suicidal thoughts 0 0   PHQ-9 Score 0 6   Difficult doing work/chores Not difficult at all Not difficult at all     Relevant past medical, surgical, family and social history reviewed and updated as indicated. Interim medical history since our last visit reviewed. Allergies and medications reviewed and updated.  Review of Systems  Per HPI unless specifically indicated above     Objective:    LMP 09/13/2019 (LMP Unknown)   {Vitals History (Optional):23777} Wt Readings from Last 3 Encounters:  08/12/23 181 lb (82.1 kg)  07/03/23 175 lb (79.4 kg)  06/20/23 178 lb (80.7 kg)    Physical Exam Physical Exam    Results for orders placed or performed in visit on 08/12/23  POCT urinalysis dipstick   Collection Time: 08/12/23  9:43 AM  Result Value Ref Range   Color, UA Yellow    Clarity, UA Clear    Glucose, UA Negative Negative   Bilirubin, UA Negative    Ketones, UA Negative    Spec Grav, UA 1.020 1.010 - 1.025   Blood, UA Negative    pH, UA 6.0 5.0 - 8.0   Protein, UA Negative Negative   Urobilinogen, UA 0.2 0.2 or 1.0 E.U./dL   Nitrite, UA Negative    Leukocytes, UA Small (1+) (A) Negative   Appearance Normal    Odor None   Urine  Culture   Collection Time: 08/12/23  9:51 AM   Specimen: Urine  Result Value Ref Range   MICRO NUMBER: [id number]    SPECIMEN QUALITY: Adequate    Sample Source URINE, CLEAN CATCH    STATUS: FINAL    ISOLATE 1: Streptococcus agalactiae (A)    {Labs (Optional):23779}    Assessment & Plan:   Problem List Items Addressed This Visit   None    Assessment and Plan Assessment & Plan         Follow up plan: No follow-ups on file.

## 2023-11-20 ENCOUNTER — Ambulatory Visit: Admitting: Nurse Practitioner

## 2023-11-20 ENCOUNTER — Encounter: Payer: Self-pay | Admitting: Nurse Practitioner

## 2023-11-20 VITALS — BP 112/70 | HR 84 | Resp 18 | Ht 60.0 in | Wt 191.8 lb

## 2023-11-20 DIAGNOSIS — R051 Acute cough: Secondary | ICD-10-CM | POA: Diagnosis not present

## 2023-11-20 DIAGNOSIS — N390 Urinary tract infection, site not specified: Secondary | ICD-10-CM | POA: Diagnosis not present

## 2023-11-20 MED ORDER — BENZONATATE 100 MG PO CAPS: 200.0000 mg | ORAL_CAPSULE | Freq: Two times a day (BID) | ORAL | 0 refills | Status: DC | PRN

## 2023-11-21 ENCOUNTER — Telehealth: Payer: Self-pay

## 2023-11-21 NOTE — Telephone Encounter (Signed)
 Spoke to patient and advised could have been a mistake but to not pick up prescription. FYI

## 2023-11-21 NOTE — Telephone Encounter (Signed)
 Copied from CRM [id number]. Topic: Clinical - Medication Question >> Nov 21, 2023  1:59 PM Marlow Baars wrote: Reason for CRM: The patient called in stating she saw the provider for Uti but was called something in for a cough and she has no idea why. She was prescribed benzonatate (TESSALON) 100 MG capsule and doesn't understand why as she said nothing about a cough. Please assist patient further.

## 2023-11-24 DIAGNOSIS — M65331 Trigger finger, right middle finger: Secondary | ICD-10-CM | POA: Diagnosis not present

## 2023-11-29 ENCOUNTER — Other Ambulatory Visit: Payer: Self-pay | Admitting: Oncology

## 2023-12-01 ENCOUNTER — Encounter: Payer: Self-pay | Admitting: Oncology

## 2023-12-03 ENCOUNTER — Ambulatory Visit: Admitting: Urology

## 2023-12-03 VITALS — BP 115/75 | HR 74 | Ht 60.0 in | Wt 189.0 lb

## 2023-12-03 DIAGNOSIS — N390 Urinary tract infection, site not specified: Secondary | ICD-10-CM | POA: Diagnosis not present

## 2023-12-03 LAB — URINALYSIS, COMPLETE
Bilirubin, UA: NEGATIVE
Glucose, UA: NEGATIVE
Ketones, UA: NEGATIVE
Leukocytes,UA: NEGATIVE
Nitrite, UA: NEGATIVE
Protein,UA: NEGATIVE
RBC, UA: NEGATIVE
Specific Gravity, UA: 1.02 (ref 1.005–1.030)
Urobilinogen, Ur: 1 mg/dL (ref 0.2–1.0)
pH, UA: 7 (ref 5.0–7.5)

## 2023-12-03 LAB — MICROSCOPIC EXAMINATION

## 2023-12-03 LAB — BLADDER SCAN AMB NON-IMAGING: Scan Result: 0

## 2023-12-03 NOTE — Progress Notes (Signed)
 I, Sheena Martinez, acting as a scribe for Sheena Knapp, MD., have documented all relevant documentation on the behalf of Sheena Knapp, MD, as directed by Sheena Knapp, MD while in the presence of Sheena Knapp, MD.  12/03/2023 3:11 PM   Sheena Martinez 1960/10/05 [id number]  Referring provider: Quinton Buckler, FNP [address] [place],  [place] [address]  Chief Complaint  Patient presents with   Recurrent UTI    HPI: Sheena Martinez is a 63 y.o. female referred for evaluation of recurrent UTIs.  Referred due to 4 UTIs in the last 6 months. On record review, she had a positive culture August 2024 for strep agalactiae and had a positive response to the drug. December 2024 for strep agalactiae. She had a culture grow mixed flora March 2025.  She states her typical symptoms are urinary urgency and bladder pressure. She does not have dysuria. Symptoms resolved with antibiotic therapy, and then return She is not sexually active, No febrile UTIs. She did have hematuria with her initial infection, but no recurrent hematuria. Past urologic history remarkable for a past stone approximately 6 years ago.  History of breast cancer on anastrozole . 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/09/2017   Overview:  Added automatically from request for surgery [id number]   Depression    siuational   GERD (gastroesophageal reflux disease)    OCC   Goiter    Hematuria 10/21/2018   History of kidney stones    Hyperthyroidism    has nodules, no meds   MRSA infection 2023   corneal infections in the left eye   Personal history of radiation therapy 2018   LEFT lumpectomy   Pre-diabetes    lost 64 pds   Sjogren's syndrome (HCC)    Sleep apnea     Uses continuous positive airway pressure (CPAP) ventilation at home 04/30/2019    Surgical History: Past Surgical History:  Procedure Laterality Date   BREAST BIOPSY Left 05/12/2017   us  bx/ INVASIVE MAMMARY CARCINOMA   BREAST LUMPECTOMY Left 05/2017   invasive mammary carcinoma, neg margins   BREAST LUMPECTOMY WITH SENTINEL LYMPH NODE BIOPSY Left 06/10/2017   Procedure: BREAST LUMPECTOMY WITH SENTINEL LYMPH NODE BX AND NEEDLE LOCALIZATION;  Surgeon: Marshall Skeeter, MD;  Location: [facility];  Service: General;  Laterality: Left;   BREAST MAMMOSITE Left 06/24/2017   Procedure: MAMMOSITE BREAST;  Surgeon: Marshall Skeeter, MD;  Location: [facility];  Service: General;  Laterality: Left;   CESAREAN SECTION  1995   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   COSMETIC SURGERY     CYST EXCISION  2018   pilar cyst/ Dr Fayne Hoover   EYE SURGERY     PORTACATH PLACEMENT Right 07/08/2017   Procedure: INSERTION PORT-A-CATH;  Surgeon: Marshall Skeeter, MD;  Location: [facility];  Service: General;  Laterality: Right;   TONSILLECTOMY      Home Medications:  Allergies as of 12/03/2023       Reactions   Rituximab Anaphylaxis   Benadryl [diphenhydramine] Other (See Comments)   Patient felt like she was going to "crawl out of her skin"  Clonazepam Anxiety, Other (See Comments)   Cortisone Other (See Comments)   Also reacts to cortisone injections; they make her flushed   Diphenhydramine Hcl    Oxycodone Rash   Facial flushing, redness throughout face and neck   Sulfamethoxazole-trimethoprim Rash   Chest tightness. Bactrim        Medication List        Accurate as of December 03, 2023  3:11 PM. If you have any questions, ask your nurse or doctor.          acetaminophen  500 MG tablet Commonly known as: TYLENOL  Take 1,000 mg by mouth every 8 (eight) hours as needed for mild pain or headache.   anastrozole  1 MG tablet Commonly known as: ARIMIDEX  Take 1  tablet by mouth once daily   benzonatate  100 MG capsule Commonly known as: TESSALON  Take 2 capsules (200 mg total) by mouth 2 (two) times daily as needed for cough.   cycloSPORINE 0.1 % Soln Place 1 drop into the left eye in the morning, at noon, in the evening, and at bedtime.   diclofenac  50 MG EC tablet Commonly known as: VOLTAREN  Take 50 mg by mouth 2 (two) times daily with a meal.   hydroxypropyl methylcellulose / hypromellose 2.5 % ophthalmic solution Commonly known as: ISOPTO TEARS / GONIOVISC Place 1 drop into both eyes as needed for dry eyes.   ketoconazole  2 % shampoo Commonly known as: NIZORAL  Apply 1 Application topically once a week.   ketoconazole  2 % cream Commonly known as: NIZORAL  Apply 1 Application topically daily as needed for irritation.   LORazepam  0.5 MG tablet Commonly known as: ATIVAN  May take 0.5-1 mg (1 to 2 tablets) po daily prn for severe stress/anxiety symptoms, use sparingly, not for daily use, rx to last more than 3 months, no refills   loteprednol 0.5 % ophthalmic suspension Commonly known as: LOTEMAX Place 1 drop into the right eye in the morning and at bedtime.   natamycin 5 % ophthalmic suspension Commonly known as: NATACYN Place 1 drop into the left eye as needed (irritation).   NON FORMULARY Place 1 drop into the left eye in the morning, at noon, in the evening, and at bedtime. voriconazole (VFEND) ophthalmic solution 1% Administer 1 drop into the left eye every hour for 14 days. Discard after 7 days and refill for full 14 days.   ondansetron  4 MG tablet Commonly known as: Zofran  Take 1 tablet (4 mg total) by mouth every 8 (eight) hours as needed for nausea or vomiting.   PARoxetine  20 MG tablet Commonly known as: PAXIL  Take 20 mg by mouth daily.   prednisoLONE acetate 1 % ophthalmic suspension Commonly known as: PRED FORTE Place 1 drop into the left eye in the morning and at bedtime.   ROLAIDS PO Take 1 tablet by mouth as  needed (indigestion).   traMADol  50 MG tablet Commonly known as: Ultram  Take 1 tablet (50 mg total) by mouth every 6 (six) hours as needed.   Vitamin D  50 MCG (2000 UT) tablet Take 2,000 Units by mouth daily.   vitamin k 100 MCG tablet Take 100 mcg by mouth 2 (two) times a week.        Allergies:  Allergies  Allergen Reactions   Rituximab Anaphylaxis   Benadryl [Diphenhydramine] Other (See Comments)    Patient felt like she was going to "crawl out of her skin"   Clonazepam Anxiety and Other (See Comments)   Cortisone Other (See Comments)  Also reacts to cortisone injections; they make her flushed   Diphenhydramine Hcl    Oxycodone Rash    Facial flushing, redness throughout face and neck   Sulfamethoxazole-Trimethoprim Rash    Chest tightness. Bactrim    Family History: Family History  Problem Relation Age of Onset   Breast cancer Maternal Aunt 45       currently ~65   Diabetes Mother    Skin cancer Mother        currently 8; TAH/BSO (age?)   Anemia Mother    Liver disease Father        deceased / not much info about him / alcoholic   Lung cancer Paternal Aunt        smoker / deceased 40s   Stomach cancer Paternal Uncle 69       deceased / deceased 24s   Ovarian cancer Maternal Grandmother 49       deceased 90s   Stomach cancer Paternal Grandfather 4       deceased 36s   Cancer Maternal Uncle        unk. type; deceased 36s   Leukemia Cousin 34       deceased 24    Social History:  reports that she quit smoking about 44 years ago. Her smoking use included cigarettes. She started smoking about 51 years ago. She has never used smokeless tobacco. She reports that she does not drink alcohol and does not use drugs.   Physical Exam: BP 115/75   Pulse 74   Ht 5' (1.524 m)   Wt 189 lb (85.7 kg)   LMP 09/13/2019 (LMP Unknown)   BMI 36.91 kg/m   Constitutional:  Alert and oriented, No acute distress. HEENT: [place] AT Respiratory: Normal respiratory effort,  no increased work of breathing. Psychiatric: Normal mood and affect.   Urinalysis Dipstick/microscopy negative.    Assessment & Plan:    1. Recurrent UTIs Does not meet the [employer] U[employer] Association criteria for recurrent UTIs in women, secondary to lack of culture documentation (requires 3 positive cultures in 1 year or 2 positive cultures within 6 months) PVR today 0 mL We discussed the use of low-dose vaginal estrogen for UTI prevention We discussed that at such a low dose, there is non-systemic absorption, however will message her oncologist to make sure this is okay.  We also discuss supplements, including cranberry and D-mannose, which may prevent bacterial adherence to the bladder mucosa.  Low dose antibiotic prophylaxis was also discussed.  She would initially like a trial of low-dose estrogen, if okay with her oncologist, and use of supplements.  Recommend a 3 month follow-up and instructed a call for development of UTI symptoms.   I have reviewed the above documentation for accuracy and completeness, and I agree with the above.   Sheena Knapp, MD  [facility] [address] [place], [place] [address] [phone]

## 2023-12-05 ENCOUNTER — Ambulatory Visit (INDEPENDENT_AMBULATORY_CARE_PROVIDER_SITE_OTHER): Admitting: Family Medicine

## 2023-12-05 ENCOUNTER — Other Ambulatory Visit: Payer: Self-pay

## 2023-12-05 ENCOUNTER — Telehealth: Payer: Self-pay | Admitting: Family Medicine

## 2023-12-05 ENCOUNTER — Encounter: Payer: Self-pay | Admitting: Family Medicine

## 2023-12-05 VITALS — BP 130/78 | HR 89 | Temp 98.8°F | Resp 16 | Ht 60.0 in | Wt 192.6 lb

## 2023-12-05 DIAGNOSIS — E0520 Thyrotoxicosis with toxic multinodular goiter without thyrotoxic crisis or storm: Secondary | ICD-10-CM | POA: Diagnosis not present

## 2023-12-05 DIAGNOSIS — Z713 Dietary counseling and surveillance: Secondary | ICD-10-CM | POA: Diagnosis not present

## 2023-12-05 DIAGNOSIS — Z0000 Encounter for general adult medical examination without abnormal findings: Secondary | ICD-10-CM

## 2023-12-05 DIAGNOSIS — R32 Unspecified urinary incontinence: Secondary | ICD-10-CM | POA: Diagnosis not present

## 2023-12-05 DIAGNOSIS — R3915 Urgency of urination: Secondary | ICD-10-CM

## 2023-12-05 DIAGNOSIS — Z6837 Body mass index (BMI) 37.0-37.9, adult: Secondary | ICD-10-CM | POA: Diagnosis not present

## 2023-12-05 DIAGNOSIS — E66812 Obesity, class 2: Secondary | ICD-10-CM | POA: Diagnosis not present

## 2023-12-05 DIAGNOSIS — Z7689 Persons encountering health services in other specified circumstances: Secondary | ICD-10-CM

## 2023-12-05 MED ORDER — SEMAGLUTIDE-WEIGHT MANAGEMENT 0.25 MG/0.5ML ~~LOC~~ SOAJ: 0.2500 mg | SUBCUTANEOUS | 0 refills | Status: AC

## 2023-12-05 MED ORDER — SEMAGLUTIDE-WEIGHT MANAGEMENT 1 MG/0.5ML ~~LOC~~ SOAJ: 1.0000 mg | SUBCUTANEOUS | 2 refills | Status: AC

## 2023-12-05 MED ORDER — SEMAGLUTIDE-WEIGHT MANAGEMENT 0.5 MG/0.5ML ~~LOC~~ SOAJ: 0.5000 mg | SUBCUTANEOUS | 0 refills | Status: AC

## 2023-12-05 NOTE — Progress Notes (Signed)
 Patient: Sheena Martinez, Female    DOB: 1960/12/23, 63 y.o.   MRN: [id number] Adeline Hone, PA-C Visit Date: 12/05/2023  Today's Provider: Adeline Hone, PA-C   Chief Complaint  Patient presents with   Annual Exam   Subjective:   Annual physical exam:  Sheena Martinez is a 63 y.o. female who presents today for complete physical exam:  Exercise/Activity:  not exercising right now Diet/nutrition:  still doing a lot of nutrition efforts, dieting, food prep, salads at home Was working on health and weight with diet and exercising and then after gallbladder surgery she gained back weight Sleep:  no conerns, sleeping well  SDOH Screenings   Food Insecurity: No Food Insecurity (12/05/2023)  Housing: Unknown (12/05/2023)  Transportation Needs: No Transportation Needs (12/05/2023)  Utilities: Not At Risk (12/05/2023)  Alcohol Screen: Low Risk  (12/05/2023)  Depression (PHQ2-9): Low Risk  (11/20/2023)  Financial Resource Strain: Medium Risk (12/05/2023)  Physical Activity: Inactive (12/05/2023)  Social Connections: Socially Integrated (12/05/2023)  Stress: No Stress Concern Present (12/05/2023)  Tobacco Use: Medium Risk (12/05/2023)  Health Literacy: Adequate Health Literacy (12/05/2023)    USPSTF grade A and B recommendations - reviewed and addressed today  Depression:  Phq 9 completed today by patient, was reviewed by me with patient in the room PHQ score is 4, pt feels stressed, some mild depressive sx Really stressed with health, issues, life caring for husband    11/20/2023    1:38 PM 06/09/2023    3:11 PM 11/01/2022    1:59 PM 11/01/2022    1:58 PM  PHQ 2/9 Scores  PHQ - 2 Score 1 0 4 3  PHQ- 9 Score 4 0 6       11/20/2023    1:38 PM 06/09/2023    3:11 PM 11/01/2022    1:59 PM 11/01/2022    1:58 PM 06/27/2022    1:19 PM  Depression screen PHQ 2/9  Decreased Interest 0 0 1 1 0  Down, Depressed, Hopeless 1 0 3 2 0  PHQ - 2 Score 1 0 4 3 0  Altered sleeping 0 0 1  0  Tired,  decreased energy 1 0 1  0  Change in appetite 1 0 0  0  Feeling bad or failure about yourself  1 0 0  0  Trouble concentrating 0 0 0  0  Moving slowly or fidgety/restless 0 0 0  0  Suicidal thoughts 0 0 0  0  PHQ-9 Score 4 0 6  0  Difficult doing work/chores Not difficult at all Not difficult at all Not difficult at all  Not difficult at all    Alcohol screening: Flowsheet Row Office Visit from 12/05/2023 in [facility]  AUDIT-C Score 0       Immunizations and Health Maintenance: Health Maintenance  Topic Date Due   DEXA SCAN  06/20/2023   COVID-19 Vaccine (1) 12/06/2023 (Originally 02/01/1966)   Zoster Vaccines- Shingrix (1 of 2) 02/19/2024 (Originally 02/02/1980)   Pneumococcal Vaccine 32-32 Years old (1 of 2 - PCV) 11/19/2024 (Originally 02/02/1980)   INFLUENZA VACCINE  03/12/2024   MAMMOGRAM  05/27/2024   Cervical Cancer Screening (HPV/Pap Cotest)  09/11/2025   Colonoscopy  12/14/2027   DTaP/Tdap/Td (2 - Td or Tdap) 07/21/2028   Hepatitis C Screening  Completed   HIV Screening  Completed   HPV VACCINES  Aged Out   Meningococcal B Vaccine  Aged Out     Hep C  Screening: done  STD testing and prevention (HIV/chl/gon/syphilis):  see above, no additional testing desired by pt today  Intimate partner violence: safe with spouse - no fear for safety but conflict with husband since TBI  Sexual History/Pain during Intercourse: Married - not sexually active very much   Menstrual History/LMP/Abnormal Bleeding:     Patient's last menstrual period was 09/13/2019 (lmp unknown).  Incontinence Symptoms: urgency/urge incontinence, freq uti, seeing urology, interested in pelvic floor pt  Breast cancer:  doing mammo - with [facility]/oncology  Cervical cancer screening: UTD  Osteoporosis:  scheduled for Dexa  Discussion on osteoporosis per age, including high calcium and vitamin D  supplementation, weight bearing exercises  Skin cancer:  Hx of skin CA -   NO Discussed atypical lesions   Colorectal cancer:   Colonoscopy is UTD, no concerning sx   Discussed concerning signs and sx of CRC  Lung cancer:   Low Dose CT Chest recommended if Age 95-80 years, 20 pack-year currently smoking OR have quit w/in 15years. Patient does not qualify.    Social History   Tobacco Use   Smoking status: Former    Current packs/day: 0.00    Types: Cigarettes    Start date: 08/12/1972    Quit date: 08/13/1979    Years since quitting: 44.3   Smokeless tobacco: Never   Tobacco comments:    AGE [age]  Vaping Use   Vaping status: Never Used  Substance Use Topics   Alcohol use: No   Drug use: No     Flowsheet Row Office Visit from 12/05/2023 in [facility]  AUDIT-C Score 0       Family History  Problem Relation Age of Onset   Breast cancer Maternal Aunt 45       currently ~65   Diabetes Mother    Skin cancer Mother        currently 30; TAH/BSO (age?)   Anemia Mother    Liver disease Father        deceased / not much info about him / alcoholic   Lung cancer Paternal Aunt        smoker / deceased 35s   Stomach cancer Paternal Uncle 69       deceased / deceased 72s   Ovarian cancer Maternal Grandmother 37       deceased 90s   Stomach cancer Paternal Grandfather 61       deceased 69s   Cancer Maternal Uncle        unk. type; deceased 78s   Leukemia Cousin 59       deceased 65     Blood pressure/Hypertension: BP Readings from Last 3 Encounters:  12/05/23 130/78  12/03/23 115/75  11/20/23 112/70    Weight/Obesity: Wt Readings from Last 3 Encounters:  12/05/23 192 lb 9.6 oz (87.4 kg)  12/03/23 189 lb (85.7 kg)  11/20/23 191 lb 12.8 oz (87 kg)   BMI Readings from Last 3 Encounters:  12/05/23 37.61 kg/m  12/03/23 36.91 kg/m  11/20/23 37.46 kg/m     Lipids:  Lab Results  Component Value Date   CHOL 209 (H) 06/27/2022   CHOL 232 (H) 05/18/2021   CHOL 207 (H) 04/25/2020   Lab Results  Component  Value Date   HDL 43 (L) 06/27/2022   HDL 41 (L) 05/18/2021   HDL 49 (L) 04/25/2020   Lab Results  Component Value Date   LDLCALC 136 (H) 06/27/2022   LDLCALC 154 (H) 05/18/2021  LDLCALC 129 (H) 04/25/2020   Lab Results  Component Value Date   TRIG 167 (H) 06/27/2022   TRIG 208 (H) 05/18/2021   TRIG 176 (H) 04/25/2020   Lab Results  Component Value Date   CHOLHDL 4.9 06/27/2022   CHOLHDL 5.7 (H) 05/18/2021   CHOLHDL 4.2 04/25/2020   No results found for: "LDLDIRECT" Based on the results of lipid panel his/her cardiovascular risk factor ( using [facility] )  in the next 10 years is: The 10-year ASCVD risk score (Arnett DK, et al., 2019) is: 5%   Values used to calculate the score:     Age: 29 years     Sex: Female     Is Non-Hispanic African American: No     Diabetic: No     Tobacco smoker: No     Systolic Blood Pressure: 130 mmHg     Is BP treated: No     HDL Cholesterol: 43 mg/dL     Total Cholesterol: 209 mg/dL  Glucose:  Glucose, Bld  Date Value Ref Range Status  06/19/2023 80 70 - 99 mg/dL Final    Comment:    Glucose reference range applies only to samples taken after fasting for at least 8 hours.  11/01/2022 81 65 - 99 mg/dL Final    Comment:    .            Fasting reference interval .   10/17/2022 97 70 - 99 mg/dL Final    Comment:    Glucose reference range applies only to samples taken after fasting for at least 8 hours.    Advanced Care Planning:  A voluntary discussion about advance care planning including the explanation and discussion of advance directives.     Social History       Social History   Socioeconomic History   Marital status: Married    Spouse name: Allison Ivory   Number of children: 3   Years of education: Not on file   Highest education level: GED or equivalent  Occupational History   Occupation: unemployed  Tobacco Use   Smoking status: Former    Current packs/day: 0.00    Types: Cigarettes    Start date: 08/12/1972     Quit date: 08/13/1979    Years since quitting: 44.3   Smokeless tobacco: Never   Tobacco comments:    AGE [age]  Vaping Use   Vaping status: Never Used  Substance and Sexual Activity   Alcohol use: No   Drug use: No   Sexual activity: Yes    Partners: Male  Other Topics Concern   Not on file  Social History Narrative   Lives in home with 2 sons, 2 grandson   Social Drivers of Health   Financial Resource Strain: Medium Risk (12/05/2023)   Overall Financial Resource Strain (CARDIA)    Difficulty of Paying Living Expenses: Somewhat hard  Food Insecurity: No Food Insecurity (12/05/2023)   Hunger Vital Sign    Worried About Running Out of Food in the Last Year: Never true    Ran Out of Food in the Last Year: Never true  Transportation Needs: No Transportation Needs (12/05/2023)   PRAPARE - Administrator, Civil Service (Medical): No    Lack of Transportation (Non-Medical): No  Physical Activity: Inactive (12/05/2023)   Exercise Vital Sign    Days of Exercise per Week: 0 days    Minutes of Exercise per Session: 0 min  Stress: No Stress Concern Present (  12/05/2023)   [place] [employer] - Occupational Stress Questionnaire    Feeling of Stress : Only a little  Social Connections: Socially Integrated (12/05/2023)   Social Connection and Isolation Panel [NHANES]    Frequency of Communication with Friends and Family: More than three times a week    Frequency of Social Gatherings with Friends and Family: More than three times a week    Attends Religious Services: More than 4 times per year    Active Member of [employer] or Organizations: Yes    Attends Engineer, structural: More than 4 times per year    Marital Status: Married    Family History        Family History  Problem Relation Age of Onset   Breast cancer Maternal Aunt 45       currently ~65   Diabetes Mother    Skin cancer Mother        currently 49; TAH/BSO (age?)   Anemia Mother     Liver disease Father        deceased / not much info about him / alcoholic   Lung cancer Paternal Aunt        smoker / deceased 53s   Stomach cancer Paternal Uncle 40       deceased / deceased 40s   Ovarian cancer Maternal Grandmother 23       deceased 90s   Stomach cancer Paternal Grandfather 68       deceased 32s   Cancer Maternal Uncle        unk. 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   Combined form of senile cataract of right eye 02/11/2020   Cataract 12/14/2019   Post-menopausal bleeding 10/25/2019   Osteopenia 03/18/2019   Facial flushing 10/21/2018   Tinnitus of both ears 10/21/2018   Thrombocytosis 10/20/2018   Toxic multinodular goiter 06/29/2018   Irregular menstrual cycle 03/02/2018   Screening for cervical cancer 03/02/2018   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   Anxiety 10/03/2017   Bilateral primary osteoarthritis of knee 10/03/2017   Peripheral ulcerative keratitis 10/01/2017   Malignant neoplasm of upper-inner quadrant of left breast in female, estrogen receptor positive (HCC) 05/22/2017   Hirsutism 11/24/2015   Hyperlipidemia 02/01/2014   Vitamin D  deficiency 02/01/2014   Family history of diabetes mellitus 01/31/2014   Sjogren's syndrome (HCC) 01/17/2014    Past Surgical History:  Procedure Laterality Date   BREAST BIOPSY Left 05/12/2017   us  bx/ INVASIVE MAMMARY CARCINOMA   BREAST LUMPECTOMY Left 05/2017   invasive mammary carcinoma, neg margins   BREAST LUMPECTOMY WITH SENTINEL LYMPH NODE BIOPSY Left 06/10/2017   Procedure: BREAST LUMPECTOMY WITH SENTINEL LYMPH NODE BX AND NEEDLE LOCALIZATION;  Surgeon: Marshall Skeeter, MD;  Location:  [facility] ORS;  Service: General;  Laterality: Left;   BREAST MAMMOSITE Left 06/24/2017   Procedure: MAMMOSITE BREAST;  Surgeon: Marshall Skeeter, MD;  Location: [facility] ORS;  Service: General;  Laterality: Left;   CESAREAN SECTION  1995   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   COSMETIC SURGERY     CYST EXCISION  2018   pilar cyst/ Dr Fayne Hoover   EYE SURGERY     PORTACATH PLACEMENT Right 07/08/2017   Procedure: INSERTION PORT-A-CATH;  Surgeon: Marshall Skeeter, MD;  Location: [facility] ORS;  Service: General;  Laterality: Right;   TONSILLECTOMY       Current Outpatient Medications:    acetaminophen  (TYLENOL ) 500 MG tablet, Take 1,000 mg by mouth every 8 (eight) hours as needed for mild pain or headache., Disp: , Rfl:    anastrozole  (ARIMIDEX ) 1 MG tablet, Take 1 tablet by mouth once daily, Disp: 90 tablet, Rfl: 0   benzonatate  (TESSALON ) 100 MG capsule, Take 2 capsules (200 mg total) by mouth 2 (two) times daily as needed for cough., Disp: 20 capsule, Rfl: 0   Ca Carbonate-Mag Hydroxide (ROLAIDS PO), Take 1 tablet by mouth as needed (indigestion)., Disp: , Rfl:    Cholecalciferol (VITAMIN D ) 50 MCG (2000 UT) tablet, Take 2,000 Units by mouth daily., Disp: , Rfl:    cycloSPORINE 0.1 % SOLN, Place 1 drop into the left eye in the morning, at noon, in the evening, and at bedtime., Disp: , Rfl:    diclofenac  (VOLTAREN ) 50 MG EC tablet, Take 50 mg by mouth 2 (two) times daily with a meal., Disp: , Rfl:    hydroxypropyl methylcellulose / hypromellose (ISOPTO TEARS / GONIOVISC) 2.5 % ophthalmic solution, Place 1 drop into both eyes as needed for dry eyes., Disp: , Rfl:    ketoconazole  (NIZORAL ) 2 % cream, Apply 1 Application topically daily as needed for irritation., Disp: , Rfl:    ketoconazole  (NIZORAL ) 2 % shampoo, Apply 1 Application topically once a week., Disp: , Rfl:    LORazepam  (ATIVAN ) 0.5 MG tablet, May take 0.5-1 mg (1 to 2 tablets) po daily prn for severe stress/anxiety  symptoms, use sparingly, not for daily use, rx to last more than 3 months, no refills, Disp: 20 tablet, Rfl: 0   loteprednol (LOTEMAX) 0.5 % ophthalmic suspension, Place 1 drop into the right eye in the morning and at bedtime., Disp: , Rfl:    natamycin (NATACYN) 5 % ophthalmic suspension, Place 1 drop into the left eye as needed (irritation)., Disp: , Rfl:    NON FORMULARY, Place 1 drop into the left eye in the morning, at noon, in the evening, and at bedtime. voriconazole (VFEND) ophthalmic solution 1% Administer 1 drop into the left eye every hour for 14 days. Discard after 7 days and refill for full 14 days., Disp: , Rfl:    ondansetron  (ZOFRAN ) 4 MG tablet, Take 1 tablet (4 mg total) by mouth every 8 (eight) hours as needed for nausea or vomiting., Disp: 20 tablet, Rfl: 0   PARoxetine  (PAXIL ) 20 MG tablet, Take 20 mg by mouth daily., Disp: , Rfl:    prednisoLONE acetate (PRED FORTE) 1 % ophthalmic suspension, Place 1 drop into the left eye in the morning and at bedtime., Disp: , Rfl:    Semaglutide-Weight Management 0.25 MG/0.5ML SOAJ, Inject 0.25 mg into the skin once a week for 28 days., Disp: 2 mL, Rfl: 0   [START ON 01/03/2024] Semaglutide-Weight Management 0.5 MG/0.5ML SOAJ, Inject 0.5 mg into the skin once a week for 28 days., Disp: 2 mL, Rfl: 0   [START ON 02/01/2024] Semaglutide-Weight Management 1 MG/0.5ML SOAJ, Inject 1 mg into the skin once a week., Disp: 2 mL, Rfl: 2   traMADol  (ULTRAM ) 50 MG tablet, Take 1 tablet (50 mg total) by mouth every 6 (six) hours as needed., Disp: 20  tablet, Rfl: 0   vitamin k 100 MCG tablet, Take 100 mcg by mouth 2 (two) times a week., Disp: , Rfl:  No current facility-administered medications for this visit.  Facility-Administered Medications Ordered in Other Visits:    goserelin (ZOLADEX ) injection 3.6 mg, 3.6 mg, Subcutaneous, Once, Timmy Forbes, MD  Allergies  Allergen Reactions   Rituximab Anaphylaxis   Benadryl [Diphenhydramine] Other (See Comments)     Patient felt like she was going to "crawl out of her skin"   Clonazepam Anxiety and Other (See Comments)   Cortisone Other (See Comments)    Also reacts to cortisone injections; they make her flushed   Diphenhydramine Hcl    Oxycodone Rash    Facial flushing, redness throughout face and neck   Sulfamethoxazole-Trimethoprim Rash    Chest tightness. Bactrim    Patient Care Team: Adeline Hone, PA-C as PCP - General (Family Medicine) Burnie Cartwright, RN as Registered Nurse Arlette Benders, RN (Inactive) as Registered Nurse Byrnett, Magali Schmitz, MD (General Surgery) Glenis Langdon, MD as Referring Physician (Radiation Oncology) Timmy Forbes, MD as Consulting Physician (Oncology) [place], MD as Referring Physician (Endocrinology)   Chart Review: I personally reviewed active problem list, medication list, allergies, family history, social history, health maintenance, notes from last encounter, lab results, imaging with the patient/caregiver today.   Review of Systems  Constitutional: Negative.   HENT: Negative.    Eyes: Negative.   Respiratory: Negative.    Cardiovascular: Negative.   Gastrointestinal: Negative.   Endocrine: Negative.   Genitourinary: Negative.   Musculoskeletal: Negative.   Skin: Negative.   Allergic/Immunologic: Negative.   Neurological: Negative.   Hematological: Negative.   Psychiatric/Behavioral: Negative.    All other systems reviewed and are negative.         Objective:   Vitals:  Vitals:   12/05/23 1502  BP: 130/78  Pulse: 89  Resp: 16  Temp: 98.8 F (37.1 C)  TempSrc: Oral  SpO2: 97%  Weight: 192 lb 9.6 oz (87.4 kg)  Height: 5' (1.524 m)    Body mass index is 37.61 kg/m.  Physical Exam Vitals and nursing note reviewed.  Constitutional:      General: She is not in acute distress.    Appearance: Normal appearance. She is well-developed. She is obese. She is not ill-appearing, toxic-appearing or diaphoretic.  HENT:     Head:  Normocephalic and atraumatic.     Right Ear: External ear normal.     Left Ear: External ear normal.     Nose: Nose normal.     Mouth/Throat:     Mouth: Mucous membranes are moist.     Pharynx: Oropharynx is clear. No oropharyngeal exudate or posterior oropharyngeal erythema.  Eyes:     General: No scleral icterus. Neck:     Trachea: No tracheal deviation.  Cardiovascular:     Rate and Rhythm: Normal rate and regular rhythm.     Pulses: Normal pulses.     Heart sounds: Normal heart sounds. No murmur heard.    No friction rub. No gallop.  Pulmonary:     Effort: Pulmonary effort is normal. No respiratory distress.     Breath sounds: Normal breath sounds. No stridor. No wheezing, rhonchi or rales.  Abdominal:     General: Bowel sounds are normal.  Musculoskeletal:     Right lower leg: No edema.     Left lower leg: No edema.  Skin:    General: Skin is warm and dry.  Capillary Refill: Capillary refill takes less than 2 seconds.     Findings: No rash.  Neurological:     Mental Status: She is alert. Mental status is at baseline.     Motor: No abnormal muscle tone.     Coordination: Coordination normal.  Psychiatric:        Mood and Affect: Mood normal.        Behavior: Behavior normal.       Fall Risk:    12/05/2023    3:01 PM 11/20/2023    1:38 PM 06/09/2023    3:02 PM 11/01/2022    1:58 PM 06/27/2022    1:19 PM  Fall Risk   Falls in the past year? 0 0 0 0 0  Number falls in past yr: 0 0 0 0 0  Injury with Fall? 0 0 0 0 0  Risk for fall due to : No Fall Risks  No Fall Risks  No Fall Risks  Follow up Falls evaluation completed Falls evaluation completed Falls prevention discussed;Falls evaluation completed;Education provided  Falls prevention discussed;Education provided;Falls evaluation completed    Functional Status Survey: Is the patient deaf or have difficulty hearing?: No Does the patient have difficulty seeing, even when wearing glasses/contacts?: No Does the  patient have difficulty concentrating, remembering, or making decisions?: No Does the patient have difficulty walking or climbing stairs?: No Does the patient have difficulty dressing or bathing?: No Does the patient have difficulty doing errands alone such as visiting a doctor's office or shopping?: No   Assessment & Plan:    CPE completed today  USPSTF grade A and B recommendations reviewed with patient; age-appropriate recommendations, preventive care, screening tests, etc discussed and encouraged; healthy living encouraged; see AVS for patient education given to patient  Discussed importance of 150 minutes of physical activity weekly, AHA exercise recommendations given to pt in AVS/handout  Discussed importance of healthy diet:  eating lean meats and proteins, avoiding trans fats and saturated fats, avoid simple sugars and excessive carbs in diet, eat 6 servings of fruit/vegetables daily and drink plenty of water and avoid sweet beverages.    Recommended pt to do annual eye exam and routine dental exams/cleanings  Depression, alcohol, fall screening completed as documented above and per flowsheets  Advance Care planning information and packet discussed and offered today, encouraged pt to discuss with family members/spouse/partner/friends and complete Advanced directive packet and bring copy to office   Reviewed Health Maintenance: Health Maintenance  Topic Date Due   DEXA SCAN  06/20/2023   COVID-19 Vaccine (1) 12/06/2023 (Originally 02/01/1966)   Zoster Vaccines- Shingrix (1 of 2) 02/19/2024 (Originally 02/02/1980)   Pneumococcal Vaccine 16-55 Years old (1 of 2 - PCV) 11/19/2024 (Originally 02/02/1980)   INFLUENZA VACCINE  03/12/2024   MAMMOGRAM  05/27/2024   Cervical Cancer Screening (HPV/Pap Cotest)  09/11/2025   Colonoscopy  12/14/2027   DTaP/Tdap/Td (2 - Td or Tdap) 07/21/2028   Hepatitis C Screening  Completed   HIV Screening  Completed   HPV VACCINES  Aged Out    Meningococcal B Vaccine  Aged Out    Immunizations: Immunization History  Administered Date(s) Administered   Tdap 07/21/2018         ICD-10-CM   1. Adult general medical exam  Z00.00 CBC with Differential/Platelet    Hemoglobin A1c    Lipid panel    Comprehensive metabolic panel with GFR    T4, free    TSH    2. Toxic multinodular goiter  E05.20    did meds in the past wants thyroid  labs checked with other sx/hair loss, no longer following with specialists    3. Class 2 severe obesity with serious comorbidity and body mass index (BMI) of 37.0 to 37.9 in adult, unspecified obesity type (HCC)  G95.621 Semaglutide-Weight Management 0.25 MG/0.5ML SOAJ   E66.01 Semaglutide-Weight Management 0.5 MG/0.5ML SOAJ   Z68.37 Semaglutide-Weight Management 1 MG/0.5ML SOAJ   working on [employer], eating salads with low calorie dressing, cutting out carbs, has lost weight, but regained some recently    4. Urinary urgency  R39.15 Ambulatory referral to Physical Therapy    5. Urinary incontinence, unspecified type  R32 Ambulatory referral to Physical Therapy   interested in resuming pelvic floor pt    6. Dietary counseling and surveillance  Z71.3     7. Encounter for weight management  Z76.89    reviewed wegovy/GLP meds, no contraindications, BMI 37 despite diet/lifestyle efforts x 6-12 m, wegovy rx, see if approved, close f/up ~3 m     Recurrent UTI per urology - they are asking oncology about vaginal estrogen cream safety with CA hx    Return in about 6 months (around 06/05/2024) for Routine follow-up - sooner if needed for weight loss meds.   Adeline Hone, PA-C 12/05/23 3:43 PM  [facility]

## 2023-12-05 NOTE — Telephone Encounter (Signed)
 Semaglutide-Weight Management 0.25 MG/0.5ML SOAJ   Key: [id number]

## 2023-12-05 NOTE — Patient Instructions (Signed)
 Health Maintenance  Topic Date Due   DEXA scan (bone density measurement)  06/20/2023   COVID-19 Vaccine (1) 12/06/2023*   Zoster (Shingles) Vaccine (1 of 2) 02/19/2024*   Pneumococcal Vaccination (1 of 2 - PCV) 11/19/2024*   Flu Shot  03/12/2024   Mammogram  05/27/2024   Pap with HPV screening  09/11/2025   Colon Cancer Screening  12/14/2027   DTaP/Tdap/Td vaccine (2 - Td or Tdap) 07/21/2028   Hepatitis C Screening  Completed   HIV Screening  Completed   HPV Vaccine  Aged Out   Meningitis B Vaccine  Aged Out  *Topic was postponed. The date shown is not the original due date.

## 2023-12-05 NOTE — Telephone Encounter (Signed)
 Pa STARTED

## 2023-12-06 LAB — COMPREHENSIVE METABOLIC PANEL WITH GFR
AG Ratio: 1.6 (calc) (ref 1.0–2.5)
ALT: 18 U/L (ref 6–29)
AST: 19 U/L (ref 10–35)
Albumin: 3.9 g/dL (ref 3.6–5.1)
Alkaline phosphatase (APISO): 102 U/L (ref 37–153)
BUN: 25 mg/dL (ref 7–25)
CO2: 30 mmol/L (ref 20–32)
Calcium: 9.3 mg/dL (ref 8.6–10.4)
Chloride: 107 mmol/L (ref 98–110)
Creat: 0.87 mg/dL (ref 0.50–1.05)
Globulin: 2.5 g/dL (ref 1.9–3.7)
Glucose, Bld: 82 mg/dL (ref 65–99)
Potassium: 4.9 mmol/L (ref 3.5–5.3)
Sodium: 144 mmol/L (ref 135–146)
Total Bilirubin: 0.3 mg/dL (ref 0.2–1.2)
Total Protein: 6.4 g/dL (ref 6.1–8.1)
eGFR: 75 mL/min/{1.73_m2} (ref >=60)

## 2023-12-06 LAB — CBC WITH DIFFERENTIAL/PLATELET
Absolute Lymphocytes: 2147 {cells}/uL (ref 850–3900)
Absolute Monocytes: 594 {cells}/uL (ref 200–950)
Basophils Absolute: 21 {cells}/uL (ref 0–200)
Basophils Relative: 0.4 %
Eosinophils Absolute: 69 {cells}/uL (ref 15–500)
Eosinophils Relative: 1.3 %
HCT: 35.7 % (ref 35.0–45.0)
Hemoglobin: 11.9 g/dL (ref 11.7–15.5)
MCH: 32 pg (ref 27.0–33.0)
MCHC: 33.3 g/dL (ref 32.0–36.0)
MCV: 96 fL (ref 80.0–100.0)
MPV: 9.5 fL (ref 7.5–12.5)
Monocytes Relative: 11.2 %
Neutro Abs: 2470 {cells}/uL (ref 1500–7800)
Neutrophils Relative %: 46.6 %
Platelets: 280 10*3/uL (ref 140–400)
RBC: 3.72 10*6/uL — ABNORMAL LOW (ref 3.80–5.10)
RDW: 12.7 % (ref 11.0–15.0)
Total Lymphocyte: 40.5 %
WBC: 5.3 10*3/uL (ref 3.8–10.8)

## 2023-12-06 LAB — T4, FREE: Free T4: 1.1 ng/dL (ref 0.8–1.8)

## 2023-12-06 LAB — LIPID PANEL
Cholesterol: 235 mg/dL — ABNORMAL HIGH (ref <200)
HDL: 47 mg/dL — ABNORMAL LOW (ref >=50)
LDL Cholesterol (Calc): 152 mg/dL — ABNORMAL HIGH
Non-HDL Cholesterol (Calc): 188 mg/dL — ABNORMAL HIGH (ref <130)
Total CHOL/HDL Ratio: 5 (calc) — ABNORMAL HIGH (ref <5.0)
Triglycerides: 218 mg/dL — ABNORMAL HIGH (ref <150)

## 2023-12-06 LAB — HEMOGLOBIN A1C
Hgb A1c MFr Bld: 5.4 % (ref <5.7)
Mean Plasma Glucose: 108 mg/dL
eAG (mmol/L): 6 mmol/L

## 2023-12-06 LAB — TSH: TSH: 2.82 m[IU]/L (ref 0.40–4.50)

## 2023-12-08 ENCOUNTER — Encounter: Payer: Self-pay | Admitting: Family Medicine

## 2023-12-08 ENCOUNTER — Encounter: Payer: Self-pay | Admitting: Urology

## 2023-12-09 DIAGNOSIS — M65331 Trigger finger, right middle finger: Secondary | ICD-10-CM | POA: Diagnosis not present

## 2023-12-17 ENCOUNTER — Ambulatory Visit
Admission: RE | Admit: 2023-12-17 | Discharge: 2023-12-17 | Disposition: A | Discharge status: Home / Self Care | Payer: Medicaid Other | Source: Ambulatory Visit | Attending: Oncology | Admitting: Oncology

## 2023-12-17 DIAGNOSIS — M85852 Other specified disorders of bone density and structure, left thigh: Secondary | ICD-10-CM | POA: Diagnosis not present

## 2023-12-17 DIAGNOSIS — C50212 Malignant neoplasm of upper-inner quadrant of left female breast: Secondary | ICD-10-CM | POA: Diagnosis not present

## 2023-12-17 DIAGNOSIS — Z780 Asymptomatic menopausal state: Secondary | ICD-10-CM | POA: Diagnosis not present

## 2023-12-17 DIAGNOSIS — M8580 Other specified disorders of bone density and structure, unspecified site: Secondary | ICD-10-CM | POA: Insufficient documentation

## 2023-12-17 DIAGNOSIS — Z170 Estrogen receptor positive status [ER+]: Secondary | ICD-10-CM | POA: Diagnosis not present

## 2023-12-23 ENCOUNTER — Encounter: Payer: Self-pay | Admitting: Urology

## 2023-12-23 ENCOUNTER — Other Ambulatory Visit: Payer: Self-pay | Admitting: Urology

## 2023-12-23 DIAGNOSIS — F411 Generalized anxiety disorder: Secondary | ICD-10-CM | POA: Diagnosis not present

## 2023-12-23 MED ORDER — PREMARIN 0.625 MG/GM VA CREA: TOPICAL_CREAM | VAGINAL | 1 refills | Status: AC

## 2023-12-25 ENCOUNTER — Inpatient Hospital Stay (HOSPITAL_BASED_OUTPATIENT_CLINIC_OR_DEPARTMENT_OTHER): Payer: Medicaid Other | Admitting: Oncology

## 2023-12-25 ENCOUNTER — Encounter: Payer: Self-pay | Admitting: Oncology

## 2023-12-25 ENCOUNTER — Inpatient Hospital Stay: Payer: Medicaid Other | Attending: Oncology

## 2023-12-25 VITALS — BP 120/64 | HR 69 | Temp 98.2°F | Resp 18 | Wt 194.9 lb

## 2023-12-25 DIAGNOSIS — Z87891 Personal history of nicotine dependence: Secondary | ICD-10-CM | POA: Diagnosis not present

## 2023-12-25 DIAGNOSIS — Z8614 Personal history of Methicillin resistant Staphylococcus aureus infection: Secondary | ICD-10-CM | POA: Insufficient documentation

## 2023-12-25 DIAGNOSIS — Z803 Family history of malignant neoplasm of breast: Secondary | ICD-10-CM | POA: Diagnosis not present

## 2023-12-25 DIAGNOSIS — M3500 Sicca syndrome, unspecified: Secondary | ICD-10-CM | POA: Diagnosis not present

## 2023-12-25 DIAGNOSIS — Z9089 Acquired absence of other organs: Secondary | ICD-10-CM | POA: Diagnosis not present

## 2023-12-25 DIAGNOSIS — Z1721 Progesterone receptor positive status: Secondary | ICD-10-CM | POA: Insufficient documentation

## 2023-12-25 DIAGNOSIS — M8580 Other specified disorders of bone density and structure, unspecified site: Secondary | ICD-10-CM | POA: Diagnosis not present

## 2023-12-25 DIAGNOSIS — Z79811 Long term (current) use of aromatase inhibitors: Secondary | ICD-10-CM | POA: Insufficient documentation

## 2023-12-25 DIAGNOSIS — Z79899 Other long term (current) drug therapy: Secondary | ICD-10-CM | POA: Diagnosis not present

## 2023-12-25 DIAGNOSIS — F419 Anxiety disorder, unspecified: Secondary | ICD-10-CM | POA: Insufficient documentation

## 2023-12-25 DIAGNOSIS — Z888 Allergy status to other drugs, medicaments and biological substances status: Secondary | ICD-10-CM | POA: Insufficient documentation

## 2023-12-25 DIAGNOSIS — Z87442 Personal history of urinary calculi: Secondary | ICD-10-CM | POA: Insufficient documentation

## 2023-12-25 DIAGNOSIS — Z79810 Long term (current) use of selective estrogen receptor modulators (SERMs): Secondary | ICD-10-CM | POA: Diagnosis not present

## 2023-12-25 DIAGNOSIS — E282 Polycystic ovarian syndrome: Secondary | ICD-10-CM | POA: Insufficient documentation

## 2023-12-25 DIAGNOSIS — Z885 Allergy status to narcotic agent status: Secondary | ICD-10-CM | POA: Diagnosis not present

## 2023-12-25 DIAGNOSIS — Z8379 Family history of other diseases of the digestive system: Secondary | ICD-10-CM | POA: Insufficient documentation

## 2023-12-25 DIAGNOSIS — F32A Depression, unspecified: Secondary | ICD-10-CM | POA: Insufficient documentation

## 2023-12-25 DIAGNOSIS — Z170 Estrogen receptor positive status [ER+]: Secondary | ICD-10-CM

## 2023-12-25 DIAGNOSIS — M2550 Pain in unspecified joint: Secondary | ICD-10-CM | POA: Diagnosis not present

## 2023-12-25 DIAGNOSIS — C50212 Malignant neoplasm of upper-inner quadrant of left female breast: Secondary | ICD-10-CM

## 2023-12-25 DIAGNOSIS — E0590 Thyrotoxicosis, unspecified without thyrotoxic crisis or storm: Secondary | ICD-10-CM | POA: Insufficient documentation

## 2023-12-25 DIAGNOSIS — Z832 Family history of diseases of the blood and blood-forming organs and certain disorders involving the immune mechanism: Secondary | ICD-10-CM | POA: Insufficient documentation

## 2023-12-25 DIAGNOSIS — Z833 Family history of diabetes mellitus: Secondary | ICD-10-CM | POA: Diagnosis not present

## 2023-12-25 DIAGNOSIS — Z881 Allergy status to other antibiotic agents status: Secondary | ICD-10-CM | POA: Insufficient documentation

## 2023-12-25 DIAGNOSIS — Z5986 Financial insecurity: Secondary | ICD-10-CM | POA: Diagnosis not present

## 2023-12-25 DIAGNOSIS — Z806 Family history of leukemia: Secondary | ICD-10-CM | POA: Insufficient documentation

## 2023-12-25 DIAGNOSIS — Z800 Family history of malignant neoplasm of digestive organs: Secondary | ICD-10-CM | POA: Insufficient documentation

## 2023-12-25 DIAGNOSIS — Z801 Family history of malignant neoplasm of trachea, bronchus and lung: Secondary | ICD-10-CM | POA: Insufficient documentation

## 2023-12-25 DIAGNOSIS — Z1732 Human epidermal growth factor receptor 2 negative status: Secondary | ICD-10-CM | POA: Insufficient documentation

## 2023-12-25 DIAGNOSIS — Z8041 Family history of malignant neoplasm of ovary: Secondary | ICD-10-CM | POA: Insufficient documentation

## 2023-12-25 LAB — CBC WITH DIFFERENTIAL (CANCER CENTER ONLY)
Abs Immature Granulocytes: 0.01 10*3/uL (ref 0.00–0.07)
Basophils Absolute: 0 10*3/uL (ref 0.0–0.1)
Basophils Relative: 0 %
Eosinophils Absolute: 0.1 10*3/uL (ref 0.0–0.5)
Eosinophils Relative: 2 %
HCT: 36.8 % (ref 36.0–46.0)
Hemoglobin: 12.4 g/dL (ref 12.0–15.0)
Immature Granulocytes: 0 %
Lymphocytes Relative: 48 %
Lymphs Abs: 2.2 10*3/uL (ref 0.7–4.0)
MCH: 32 pg (ref 26.0–34.0)
MCHC: 33.7 g/dL (ref 30.0–36.0)
MCV: 94.8 fL (ref 80.0–100.0)
Monocytes Absolute: 0.5 10*3/uL (ref 0.1–1.0)
Monocytes Relative: 10 %
Neutro Abs: 1.9 10*3/uL (ref 1.7–7.7)
Neutrophils Relative %: 40 %
Platelet Count: 281 10*3/uL (ref 150–400)
RBC: 3.88 MIL/uL (ref 3.87–5.11)
RDW: 13.3 % (ref 11.5–15.5)
WBC Count: 4.7 10*3/uL (ref 4.0–10.5)
nRBC: 0 % (ref 0.0–0.2)

## 2023-12-25 LAB — CMP (CANCER CENTER ONLY)
ALT: 21 U/L (ref 0–44)
AST: 22 U/L (ref 15–41)
Albumin: 3.6 g/dL (ref 3.5–5.0)
Alkaline Phosphatase: 91 U/L (ref 38–126)
Anion gap: 9 (ref 5–15)
BUN: 17 mg/dL (ref 8–23)
CO2: 25 mmol/L (ref 22–32)
Calcium: 8.9 mg/dL (ref 8.9–10.3)
Chloride: 104 mmol/L (ref 98–111)
Creatinine: 0.77 mg/dL (ref 0.44–1.00)
GFR, Estimated: >60 mL/min (ref >60)
Glucose, Bld: 112 mg/dL — ABNORMAL HIGH (ref 70–99)
Potassium: 4.2 mmol/L (ref 3.5–5.1)
Sodium: 138 mmol/L (ref 135–145)
Total Bilirubin: 0.6 mg/dL (ref 0.0–1.2)
Total Protein: 6.6 g/dL (ref 6.5–8.1)

## 2023-12-25 MED ORDER — ANASTROZOLE 1 MG PO TABS: 1.0000 mg | ORAL_TABLET | Freq: Every day | ORAL | 1 refills | Status: DC

## 2023-12-25 NOTE — Assessment & Plan Note (Signed)
 Patient is on Paxil.  Follow-up with PCP

## 2023-12-25 NOTE — Assessment & Plan Note (Signed)
 recommend patient to continue calcium and vitamin D  supplementation.  12/17/2023 DEXA - Osteopenia Major osteoporotic fracture: 8.3%

## 2023-12-25 NOTE — Progress Notes (Signed)
 Hematology/Oncology Progress note Telephone:([phone] Fax:(336) [phone]     REASON FOR VISIT Follow up for breast cancer  ASSESSMENT & PLAN:   Cancer Staging  Malignant neoplasm of upper-inner quadrant of left breast in female, estrogen receptor positive (HCC) Staging form: Breast, AJCC 8th Edition - Clinical stage from 05/22/2017: Stage IA (cT1, cN0, cM0, G1, ER+, PR+, HER2-) - Signed by Timmy Forbes, MD on 05/22/2017 - Pathologic stage from 07/09/2017: Stage IA (pT1b, pN0, cM0, G2, ER+, PR+, HER2-) - Signed by Timmy Forbes, MD on 07/30/2017   Malignant neoplasm of upper-inner quadrant of left breast in female, estrogen receptor positive (HCC) Stage IA, left Breast cancer.  Labs reviewed and discussed with patient. Continue Arimidex , extended endocrine therapy, till March 2029 if she tolerates.  Recommend annual mammogram. -Next due October 2025    Osteopenia recommend patient to continue calcium and vitamin D  supplementation.  12/17/2023 DEXA - Osteopenia Major osteoporotic fracture: 8.3%  Anxiety Patient is on Paxil .  Follow-up with PCP  Orders Placed This Encounter  Procedures   MM 3D SCREENING MAMMOGRAM BILATERAL BREAST    Standing Status:   Future    Expected Date:   05/19/2024    Expiration Date:   12/24/2024    Reason for Exam (SYMPTOM  OR DIAGNOSIS REQUIRED):   history of breast cancer    Preferred imaging location?:   [facility]   CMP (Cancer Center only)    Standing Status:   Future    Expected Date:   06/26/2024    Expiration Date:   12/24/2024   CBC with Differential (Cancer Center Only)    Standing Status:   Future    Expected Date:   06/26/2024    Expiration Date:   12/24/2024   Follow-up in 6 months. All questions were answered. The patient knows to call the clinic with any problems, questions or concerns.  Timmy Forbes, MD, PhD [facility] Hematology Oncology 12/25/2023    HISTORY OF PRESENTING ILLNESS:  Sheena Martinez is a  63 y.o.  female with  Stage 1A ,ER PR positive, HER-2 negative left invasive mammary carcinoma, pT1bN0, s/p lumpectomy. Margin is negative, no LVI. Mammoprintr revealed 10 year recurrence rate at 29% high risk group. Patient has completed MammoSite RT. Denies any hormonal replacement therapy.  Adjuvant chemotherapy with TC, finished 3 cycles.  She is perimenopausal.   # His Sjogren disease for which she has had tear duct surgery. She also reports history of PCOS with elevated testosterone level.   Genetic testing:  Genetic testing result vis [facility] showed no pathogenic sequence appearance or deletions /duplications.  # she developed corneal ulcer for which she has to undergo emergency ophthalmology surgery.  This is considered to be related to Sjogren's syndrome.  Patient has been started on prednisone by ophthalmologist.  Her anxiety has not been well controlled lately due to her ophthalmology problems.  Patient has been on Paxil  for a long time and recently switched to Lexapro  as Paxil  interfere with efficacy of tamoxifen .  She feels her anxiety is not well controlled.  Treatment Adjuvant TC x3. Patient declined cycle 4.  Started on Tamoxifen  in March 2019. Declined option of ovarian suppression with aromatase inhibitor as patient is concerned about having new side effects.    She has anxiety and follows up with [facility] psychiatrist and was previously on Zoloft and Ativan .  Zoloft was switched back to Paxil  after tamoxifen  was discontinued.  Hyperthyroidism, on methimazole.  Follows up with endocrinology. Cornea ulcer, s/p  cornea transplant. Currently on steroid eye drop Denies any fever, chills, chest pain, shortness of breath, abdominal pain, back pain, lower extremity swelling. Denies any concern of her breast.  She has had bilateral diagnostic mammogram done on 05/06/2019  which did not show malignant findings. Bone density 03/15/2019 showed osteopenia.   #Adjuvant tamoxifen , switched to Zoladex  plus Arimidex ,  Zoladex  was discontinued after confirming postmenopausal state.   patient had needed multiple eye.  Injections for treatment of MRSA left infection and has been on vancomycin eyedrops.  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  She follows up mostly with her ophthalmologist.  She is also on methotrexate for autoimmune diseases.  Follows up with rheumatology.   INTERVAL HISTORY Sheena Martinez is a 63 y.o. female who has above history reviewed by me today presents for follow up visit for management of breast cancer.  Patient takes Arimidex  daily.  Overall tolerates well.  No new complaints.  She has no breast concerns today. Patient uses a pea-sized amount of estrogen cream due to vaginal introitus twice weekly.  Prescribed by urology   Review of Systems  Constitutional:  Negative for appetite change, chills, fatigue and fever.  HENT:   Negative for hearing loss and voice change.   Eyes:  Positive for eye problems.  Respiratory:  Negative for chest tightness and cough.   Cardiovascular:  Negative for chest pain.  Gastrointestinal:  Negative for abdominal distention, abdominal pain and blood in stool.  Endocrine: Positive for hot flashes.  Genitourinary:  Negative for difficulty urinating and frequency.   Musculoskeletal:  Positive for arthralgias.  Skin:  Negative for itching and rash.  Neurological:  Negative for extremity weakness.  Hematological:  Negative for adenopathy.  Psychiatric/Behavioral:  Negative for confusion. The patient is nervous/anxious.     MEDICAL HISTORY:  Past Medical History:  Diagnosis Date   Anxiety    Arthritis    Breast cancer (HCC) 05/12/2017   INVASIVE MAMMARY CARCINOMA ER/PR positive LEFT BREAST UPPER inner  QUAD   Cataract 01/11/2020   left   Corneal perforation of left eye 10/09/2017   Overview:  Added automatically from request for surgery [id number]   Depression    siuational   GERD (gastroesophageal reflux disease)    OCC    Goiter    Hematuria 10/21/2018   History of kidney stones    Hyperthyroidism    has nodules, no meds   MRSA infection 2023   corneal infections in the left eye   Personal history of radiation therapy 2018   LEFT lumpectomy   Pre-diabetes    lost 64 pds   Sjogren's syndrome (HCC)    Sleep apnea    Uses continuous positive airway pressure (CPAP) ventilation at home 04/30/2019    SURGICAL HISTORY: Past Surgical History:  Procedure Laterality Date   BREAST BIOPSY Left 05/12/2017   us  bx/ INVASIVE MAMMARY CARCINOMA   BREAST LUMPECTOMY Left 05/2017   invasive mammary carcinoma, neg margins   BREAST LUMPECTOMY WITH SENTINEL LYMPH NODE BIOPSY Left 06/10/2017   Procedure: BREAST LUMPECTOMY WITH SENTINEL LYMPH NODE BX AND NEEDLE LOCALIZATION;  Surgeon: Marshall Skeeter, MD;  Location: [facility] ORS;  Service: General;  Laterality: Left;   BREAST MAMMOSITE Left 06/24/2017   Procedure: MAMMOSITE BREAST;  Surgeon: Marshall Skeeter, MD;  Location: [facility] ORS;  Service: General;  Laterality: Left;   CESAREAN SECTION  1995   CORNEAL TRANSPLANT  11/2017   [facility] has had a total 4  transplants   COSMETIC SURGERY     CYST EXCISION  2018   pilar cyst/ Dr Fayne Hoover   EYE SURGERY     PORTACATH PLACEMENT Right 07/08/2017   Procedure: INSERTION PORT-A-CATH;  Surgeon: Marshall Skeeter, MD;  Location: [facility] ORS;  Service: General;  Laterality: Right;   TONSILLECTOMY      SOCIAL HISTORY: Social History   Socioeconomic History   Marital status: Married    Spouse name: Allison Ivory   Number of children: 3   Years of education: Not on file   Highest education level: GED or equivalent  Occupational History   Occupation: unemployed  Tobacco Use   Smoking status: Former    Current packs/day: 0.00    Types: Cigarettes    Start date: 08/12/1972    Quit date: 08/13/1979    Years since quitting: 44.3   Smokeless tobacco: Never   Tobacco comments:    [age]  Vaping Use   Vaping status: Never Used  Substance  and Sexual Activity   Alcohol use: No   Drug use: No   Sexual activity: Yes    Partners: Male  Other Topics Concern   Not on file  Social History Narrative   Lives in home with 2 sons, 2 grandson   Social Drivers of Health   Financial Resource Strain: Medium Risk (12/05/2023)   Overall Financial Resource Strain (CARDIA)    Difficulty of Paying Living Expenses: Somewhat hard  Food Insecurity: No Food Insecurity (12/05/2023)   Hunger Vital Sign    Worried About Running Out of Food in the Last Year: Never true    Ran Out of Food in the Last Year: Never true  Transportation Needs: No Transportation Needs (12/05/2023)   PRAPARE - Administrator, Civil Service (Medical): No    Lack of Transportation (Non-Medical): No  Physical Activity: Inactive (12/05/2023)   Exercise Vital Sign    Days of Exercise per Week: 0 days    Minutes of Exercise per Session: 0 min  Stress: No Stress Concern Present (12/05/2023)   [employer] of Occupational Health - Occupational Stress Questionnaire    Feeling of Stress : Only a little  Social Connections: Socially Integrated (12/05/2023)   Social Connection and Isolation Panel [NHANES]    Frequency of Communication with Friends and Family: More than three times a week    Frequency of Social Gatherings with Friends and Family: More than three times a week    Attends Religious Services: More than 4 times per year    Active Member of [employer] or Organizations: Yes    Attends Engineer, structural: More than 4 times per year    Marital Status: Married  Catering manager Violence: Not At Risk (12/05/2023)   Humiliation, Afraid, Rape, and Kick questionnaire    Fear of Current or Ex-Partner: No    Emotionally Abused: No    Physically Abused: No    Sexually Abused: No    FAMILY HISTORY: Family History  Problem Relation Age of Onset   Breast cancer Maternal Aunt 45       currently ~65   Diabetes Mother    Skin cancer Mother         currently 68; TAH/BSO (age?)   Anemia Mother    Liver disease Father        deceased / not much info about him / alcoholic   Lung cancer Paternal Aunt        smoker / deceased 54s  Stomach cancer Paternal Uncle 72       deceased / deceased 52s   Ovarian cancer Maternal Grandmother 50       deceased 90s   Stomach cancer Paternal Grandfather 37       deceased 46s   Cancer Maternal Uncle        unk. type; deceased 62s   Leukemia Cousin 1       deceased 33    ALLERGIES:  is allergic to rituximab, benadryl [diphenhydramine], clonazepam, cortisone, diphenhydramine hcl, oxycodone, and sulfamethoxazole-trimethoprim.  MEDICATIONS:  Current Outpatient Medications  Medication Sig Dispense Refill   acetaminophen  (TYLENOL ) 500 MG tablet Take 1,000 mg by mouth every 8 (eight) hours as needed for mild pain or headache.     benzonatate  (TESSALON ) 100 MG capsule Take 2 capsules (200 mg total) by mouth 2 (two) times daily as needed for cough. 20 capsule 0   Ca Carbonate-Mag Hydroxide (ROLAIDS PO) Take 1 tablet by mouth as needed (indigestion).     Cholecalciferol (VITAMIN D ) 50 MCG (2000 UT) tablet Take 2,000 Units by mouth daily.     conjugated estrogens (PREMARIN) vaginal cream Apply a pea-sized amount to fingertip or Q-tip and white in the vaginal roof twice weekly 42.5 g 1   cycloSPORINE 0.1 % SOLN Place 1 drop into the left eye in the morning, at noon, in the evening, and at bedtime.     diclofenac  (VOLTAREN ) 50 MG EC tablet Take 50 mg by mouth 2 (two) times daily with a meal.     hydroxypropyl methylcellulose / hypromellose (ISOPTO TEARS / GONIOVISC) 2.5 % ophthalmic solution Place 1 drop into both eyes as needed for dry eyes.     ketoconazole  (NIZORAL ) 2 % cream Apply 1 Application topically daily as needed for irritation.     ketoconazole  (NIZORAL ) 2 % shampoo Apply 1 Application topically once a week.     LORazepam  (ATIVAN ) 0.5 MG tablet May take 0.5-1 mg (1 to 2 tablets) po daily prn for  severe stress/anxiety symptoms, use sparingly, not for daily use, rx to last more than 3 months, no refills 20 tablet 0   loteprednol (LOTEMAX) 0.5 % ophthalmic suspension Place 1 drop into the right eye in the morning and at bedtime.     natamycin (NATACYN) 5 % ophthalmic suspension Place 1 drop into the left eye as needed (irritation).     NON FORMULARY Place 1 drop into the left eye in the morning, at noon, in the evening, and at bedtime. voriconazole (VFEND) ophthalmic solution 1% Administer 1 drop into the left eye every hour for 14 days. Discard after 7 days and refill for full 14 days.     ondansetron  (ZOFRAN ) 4 MG tablet Take 1 tablet (4 mg total) by mouth every 8 (eight) hours as needed for nausea or vomiting. 20 tablet 0   PARoxetine  (PAXIL ) 20 MG tablet Take 20 mg by mouth daily.     prednisoLONE acetate (PRED FORTE) 1 % ophthalmic suspension Place 1 drop into the left eye in the morning and at bedtime.     Semaglutide -Weight Management 0.25 MG/0.5ML SOAJ Inject 0.25 mg into the skin once a week for 28 days. 2 mL 0   [START ON 01/03/2024] Semaglutide -Weight Management 0.5 MG/0.5ML SOAJ Inject 0.5 mg into the skin once a week for 28 days. 2 mL 0   [START ON 02/01/2024] Semaglutide -Weight Management 1 MG/0.5ML SOAJ Inject 1 mg into the skin once a week. 2 mL 2   traMADol  (ULTRAM )  50 MG tablet Take 1 tablet (50 mg total) by mouth every 6 (six) hours as needed. 20 tablet 0   vitamin k 100 MCG tablet Take 100 mcg by mouth 2 (two) times a week.     anastrozole  (ARIMIDEX ) 1 MG tablet Take 1 tablet (1 mg total) by mouth daily. 90 tablet 1   No current facility-administered medications for this visit.   Facility-Administered Medications Ordered in Other Visits  Medication Dose Route Frequency Provider Last Rate Last Admin   goserelin (ZOLADEX ) injection 3.6 mg  3.6 mg Subcutaneous Once Timmy Forbes, MD          .  PHYSICAL EXAMINATION: ECOG PERFORMANCE STATUS: 1 - Symptomatic but completely  ambulatory Vitals:   12/25/23 1344  BP: 120/64  Pulse: 69  Resp: 18  Temp: 98.2 F (36.8 C)  SpO2: 97%    Physical Exam Constitutional:      General: She is not in acute distress.    Appearance: She is not diaphoretic.     Comments: Obese.   HENT:     Head: Normocephalic and atraumatic.  Eyes:     General: No scleral icterus. Neck:     Thyroid : No thyromegaly.  Cardiovascular:     Rate and Rhythm: Normal rate and regular rhythm.     Heart sounds: Normal heart sounds. No murmur heard. Pulmonary:     Effort: Pulmonary effort is normal. No respiratory distress.     Breath sounds: Normal breath sounds. No wheezing.  Abdominal:     General: Bowel sounds are normal. There is no distension.     Palpations: Abdomen is soft.     Tenderness: There is no abdominal tenderness.  Musculoskeletal:        General: Normal range of motion.     Cervical back: Normal range of motion and neck supple.  Skin:    General: Skin is warm and dry.     Findings: No erythema or rash.  Neurological:     Mental Status: She is alert and oriented to person, place, and time. Mental status is at baseline.     Motor: No abnormal muscle tone.  Psychiatric:        Mood and Affect: Mood and affect normal.   Breast exam was performed in seated and lying down position. No palpable mass in bilateral breast.  No palpable axillary lymphadenopathy bilaterally.   LABORATORY DATA:  I have reviewed the data as listed: no recent labs.     Latest Ref Rng & Units 12/25/2023    1:31 PM 12/05/2023    3:40 PM 06/19/2023    1:14 PM  CBC  WBC 4.0 - 10.5 K/uL 4.7  5.3  5.0   Hemoglobin 12.0 - 15.0 g/dL 16.1  09.6  04.5   Hematocrit 36.0 - 46.0 % 36.8  35.7  35.1   Platelets 150 - 400 K/uL 281  280  257       Latest Ref Rng & Units 12/25/2023    1:31 PM 12/05/2023    3:40 PM 06/19/2023    1:14 PM  CMP  Glucose 70 - 99 mg/dL 409  82  80   BUN 8 - 23 mg/dL 17  25  17    Creatinine 0.44 - 1.00 mg/dL 8.11  9.14  7.82    Sodium 135 - 145 mmol/L 138  144  140   Potassium 3.5 - 5.1 mmol/L 4.2  4.9  4.4   Chloride 98 - 111 mmol/L 104  107  107   CO2 22 - 32 mmol/L 25  30  28    Calcium 8.9 - 10.3 mg/dL 8.9  9.3  9.0   Total Protein 6.5 - 8.1 g/dL 6.6  6.4  6.0   Total Bilirubin 0.0 - 1.2 mg/dL 0.6  0.3  0.4   Alkaline Phos 38 - 126 U/L 91   100   AST 15 - 41 U/L 22  19  21    ALT 0 - 44 U/L 21  18  18         RADIOGRAPHIC STUDIES: I have personally reviewed the radiological images as listed and agreed with the findings in the report. DG Bone Density Result Date: 12/17/2023 EXAM: DUAL X-RAY ABSORPTIOMETRY (DXA) FOR BONE MINERAL DENSITY 12/17/2023 2:05 pm CLINICAL DATA:  63 year old Female Postmenopausal. Screening for osteoporosis Patient is or has been on glucocorticoid therapy. TECHNIQUE: An axial (e.g., hips, spine) and/or appendicular (e.g., radius) exam was performed, as appropriate, using GE Secretary/administrator at [facility]. Images are obtained for bone mineral density measurement and are not obtained for diagnostic purposes. [id number] Exclusions: None. COMPARISON:  03/15/2019, 06/19/2021. FINDINGS: Scan quality: Good. LUMBAR SPINE (L1-L4): BMD (in g/cm2): 1.377 T-score: 1.5 Z-score: 2.9 Rate of change from previous exam: 5.0 % LEFT FEMORAL NECK: BMD (in g/cm2): 0.805 T-score: -1.7 Z-score: -0.3 Rate of change from previous exam: -11.8 % LEFT TOTAL HIP: BMD (in g/cm2): 0.955 T-score: -0.4 Z-score: 0.7 RIGHT FEMORAL NECK: BMD (in g/cm2): 0.915 T-score: -0.9 Z-score: 0.5 RIGHT TOTAL HIP: BMD (in g/cm2): 0.992 T-score: -0.1 Z-score: 0.9 DUAL-FEMUR TOTAL MEAN: Rate of change from previous exam: -10.4 % FRAX 10-YEAR PROBABILITY OF FRACTURE: 10-year fracture risk is performed using the [employer] FRAX calculator based on patient-reported risk factors. Major osteoporotic fracture: 8.3% Hip fracture: 0.8% Other situations known to alter the reliability of the FRAX score should be  considered when making treatment decisions, including chronic glucocorticoid use and past treatments. Further guidance on treatment can be found at the [facility] Osteoporosis Foundation's website https://www.patton.com/. IMPRESSION: Osteopenia based on BMD. Fracture risk is increased. Increased risk is based on low BMD. RECOMMENDATIONS: 1. All patients should optimize calcium and vitamin D  intake. 2. Consider FDA-approved medical therapies in postmenopausal women and men aged 66 years and older, based on the following: - A hip or vertebral (clinical or morphometric) fracture - T-score less than or equal to -2.5 and secondary causes have been excluded. - Low bone mass (T-score between -1.0 and -2.5) and a 10-year probability of a hip fracture greater than or equal to 3% or a 10-year probability of a major osteoporosis-related fracture greater than or equal to 20% based on the [place] -adapted WHO algorithm. - Clinician judgment and/or patient preferences may indicate treatment for people with 10-year fracture probabilities above or below these levels 3. Patients with diagnosis of osteoporosis or at high risk for fracture should have regular bone mineral density tests. For patients eligible for Medicare, routine testing is allowed once every 2 years. The testing frequency can be increased to one year for patients who have rapidly progressing disease, those who are receiving or discontinuing medical therapy to restore bone mass, or have additional risk factors. Electronically Signed   By: Sundra Engel M.D.   On: 12/17/2023 14:52

## 2023-12-25 NOTE — Assessment & Plan Note (Addendum)
 Stage IA, left Breast cancer.  Labs reviewed and discussed with patient. Continue Arimidex , extended endocrine therapy, till March 2029 if she tolerates.  Recommend annual mammogram. -Next due October 2025

## 2024-01-06 ENCOUNTER — Other Ambulatory Visit: Payer: Self-pay | Admitting: Family Medicine

## 2024-01-06 DIAGNOSIS — E66812 Obesity, class 2: Secondary | ICD-10-CM

## 2024-01-08 NOTE — Telephone Encounter (Signed)
 Requested Prescriptions  Refused Prescriptions Disp Refills   WEGOVY  0.25 MG/0.5ML SOAJ [Pharmacy Med Name: Wegovy  0.25 MG/0.5ML Subcutaneous Solution Auto-injector] 4 mL 0    Sig: INJECT ONE SYRINGEFUL (0.25MG ) INTO THE SKIN ONCE WEEKLY     Endocrinology:  Diabetes - GLP-1 Receptor Agonists - semaglutide  Passed - 01/08/2024  3:11 PM      Passed - HBA1C in normal range and within 180 days    Hgb A1c MFr Bld  Date Value Ref Range Status  12/05/2023 5.4 <5.7 % Final    Comment:    For the purpose of screening for the presence of diabetes: . <5.7%       Consistent with the absence of diabetes 5.7-6.4%    Consistent with increased risk for diabetes             (prediabetes) > or =6.5%  Consistent with diabetes . This assay result is consistent with a decreased risk of diabetes. . Currently, no consensus exists regarding use of hemoglobin A1c for diagnosis of diabetes in children. . According to American Diabetes Association (ADA) guidelines, hemoglobin A1c <7.0% represents optimal control in non-pregnant diabetic patients. Different metrics may apply to specific patient populations.  Standards of Medical Care in Diabetes(ADA). .          Passed - Cr in normal range and within 360 days    Creatinine  Date Value Ref Range Status  12/25/2023 0.77 0.44 - 1.00 mg/dL Final   Creat  Date Value Ref Range Status  12/05/2023 0.87 0.50 - 1.05 mg/dL Final         Passed - Valid encounter within last 6 months    Recent Outpatient Visits           1 month ago Adult general medical exam   [facility] Adeline Hone, PA-C   1 month ago Recurrent UTI   [facility] Quinton Buckler, FNP       Future Appointments             In 5 months Adeline Hone, PA-C [facility], [facility]

## 2024-02-28 ENCOUNTER — Other Ambulatory Visit: Payer: Self-pay | Admitting: Family Medicine

## 2024-03-02 NOTE — Telephone Encounter (Signed)
 Requested medications are due for refill today.  unsure  Requested medications are on the active medications list.  yes  Last refill. 06/13/2023   Future visit scheduled.   yes  Notes to clinic.  Refill not delegated.    Requested Prescriptions  Pending Prescriptions Disp Refills   ketoconazole  (NIZORAL ) 2 % shampoo [Pharmacy Med Name: KETOCONAZOLE  2%     SHA] 120 mL 0    Sig: SHAMPOO TOPICALLY TO AFFECTED AREA TWICE A WEEK     Not Delegated - Over the Counter: OTC 2 Failed - 03/02/2024 10:56 AM      Failed - This refill cannot be delegated      Passed - Valid encounter within last 12 months    Recent Outpatient Visits           2 months ago Adult general medical exam   [facility] Leavy Mole, PA-C   3 months ago Recurrent UTI   [facility] Gareth Mliss FALCON, FNP       Future Appointments             In 3 months Leavy Mole, PA-C [facility], [facility]

## 2024-04-15 DIAGNOSIS — M3501 Sicca syndrome with keratoconjunctivitis: Secondary | ICD-10-CM | POA: Diagnosis not present

## 2024-04-15 DIAGNOSIS — H16002 Unspecified corneal ulcer, left eye: Secondary | ICD-10-CM | POA: Diagnosis not present

## 2024-04-15 DIAGNOSIS — M65341 Trigger finger, right ring finger: Secondary | ICD-10-CM | POA: Diagnosis not present

## 2024-04-20 DIAGNOSIS — M65311 Trigger thumb, right thumb: Secondary | ICD-10-CM | POA: Diagnosis not present

## 2024-04-20 DIAGNOSIS — M65342 Trigger finger, left ring finger: Secondary | ICD-10-CM | POA: Diagnosis not present

## 2024-04-20 DIAGNOSIS — M65341 Trigger finger, right ring finger: Secondary | ICD-10-CM | POA: Diagnosis not present

## 2024-04-20 DIAGNOSIS — M170 Bilateral primary osteoarthritis of knee: Secondary | ICD-10-CM | POA: Diagnosis not present

## 2024-05-28 ENCOUNTER — Encounter: Payer: Self-pay | Admitting: Oncology

## 2024-05-28 ENCOUNTER — Ambulatory Visit
Admission: RE | Admit: 2024-05-28 | Discharge: 2024-05-28 | Disposition: A | Discharge status: Home / Self Care | Source: Ambulatory Visit | Attending: Oncology | Admitting: Oncology

## 2024-05-28 ENCOUNTER — Other Ambulatory Visit (HOSPITAL_COMMUNITY): Payer: Self-pay

## 2024-05-28 DIAGNOSIS — Z170 Estrogen receptor positive status [ER+]: Secondary | ICD-10-CM | POA: Insufficient documentation

## 2024-05-28 DIAGNOSIS — C50212 Malignant neoplasm of upper-inner quadrant of left female breast: Secondary | ICD-10-CM | POA: Insufficient documentation

## 2024-05-28 DIAGNOSIS — Z1231 Encounter for screening mammogram for malignant neoplasm of breast: Secondary | ICD-10-CM | POA: Insufficient documentation

## 2024-06-07 ENCOUNTER — Ambulatory Visit: Admitting: Family Medicine

## 2024-06-07 ENCOUNTER — Ambulatory Visit: Admitting: Nurse Practitioner

## 2024-06-16 ENCOUNTER — Ambulatory Visit: Admitting: Family Medicine

## 2024-06-22 ENCOUNTER — Ambulatory Visit: Admitting: Internal Medicine

## 2024-06-22 ENCOUNTER — Encounter: Payer: Self-pay | Admitting: Internal Medicine

## 2024-06-22 VITALS — BP 110/78 | HR 63 | Temp 98.6°F | Ht 60.0 in | Wt 199.0 lb

## 2024-06-22 DIAGNOSIS — E66812 Obesity, class 2: Secondary | ICD-10-CM | POA: Diagnosis not present

## 2024-06-22 DIAGNOSIS — Z170 Estrogen receptor positive status [ER+]: Secondary | ICD-10-CM

## 2024-06-22 DIAGNOSIS — Z947 Corneal transplant status: Secondary | ICD-10-CM

## 2024-06-22 DIAGNOSIS — Z6837 Body mass index (BMI) 37.0-37.9, adult: Secondary | ICD-10-CM | POA: Diagnosis not present

## 2024-06-22 DIAGNOSIS — N390 Urinary tract infection, site not specified: Secondary | ICD-10-CM

## 2024-06-22 DIAGNOSIS — M170 Bilateral primary osteoarthritis of knee: Secondary | ICD-10-CM

## 2024-06-22 DIAGNOSIS — L821 Other seborrheic keratosis: Secondary | ICD-10-CM | POA: Diagnosis not present

## 2024-06-22 DIAGNOSIS — C50212 Malignant neoplasm of upper-inner quadrant of left female breast: Secondary | ICD-10-CM | POA: Diagnosis not present

## 2024-06-22 DIAGNOSIS — F4323 Adjustment disorder with mixed anxiety and depressed mood: Secondary | ICD-10-CM | POA: Diagnosis not present

## 2024-06-22 DIAGNOSIS — E559 Vitamin D deficiency, unspecified: Secondary | ICD-10-CM | POA: Diagnosis not present

## 2024-06-22 MED ORDER — KETOCONAZOLE 2 % EX CREA: 1.0000 | TOPICAL_CREAM | Freq: Every day | CUTANEOUS | 3 refills | Status: AC | PRN

## 2024-06-22 MED ORDER — KETOCONAZOLE 2 % EX SHAM
1.0000 | MEDICATED_SHAMPOO | CUTANEOUS | 3 refills | Status: AC
Start: 2024-06-22 — End: ?

## 2024-06-22 NOTE — Progress Notes (Signed)
 Established Patient Office Visit  Subjective   Patient ID: Sheena Martinez, female    DOB: 1961-03-21  Age: 63 y.o. MRN: [id number]  Chief Complaint  Patient presents with   Medical Management of Chronic Issues   Medication Refill    Diclofinac 1% gell    Medication Refill    Patient is here for follow up on chronic medical conditions.   Discussed the use of AI scribe software for clinical note transcription with the patient, who gave verbal consent to proceed.  History of Present Illness Sheena Martinez is a 63 year old female who presents for medication refills and review of her medical history.  She is currently on Arimidex  for breast cancer treatment and is scheduled to see her oncologist soon. She developed an eye ulcer during chemotherapy, leading to complications and multiple cornea transplants. She follows up with [facility] and uses prescribed eye drops. Recurrent UTIs have been managed with estrogen cream since May, with no further infections. She takes Paxil  20 mg and Ativan  0.5 mg as needed for psychiatric care. For knee pain due to arthritis, she uses Voltaren  cream, receives cortisone shots, and takes diclofenac . She takes vitamin D  daily and vitamin K twice a week for a deficiency. She experienced significant weight loss three years ago but regained 25 pounds in the past year, expressing concerns about binge eating and weight management. Her husband's severe car accident and resulting brain injuries have made her his primary caregiver, impacting her daily life.   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  -Following with  Ophthalmology, on multiple eye drops  MDD: -Following with [facility]  -Mood status: controlled -Current treatment: Paxil  20 mg, Ativan  0.5 mg PRN -Satisfied with current treatment?: yes -Duration of current treatment : chronic -Side effects: no Medication compliance: excellent compliance     06/22/2024    2:47 PM 11/20/2023    1:38 PM 06/09/2023    3:11 PM 11/01/2022    1:59 PM 11/01/2022    1:58 PM  Depression screen PHQ 2/9  Decreased Interest 0 0 0 1 1  Down, Depressed, Hopeless 0 1 0 3 2  PHQ - 2 Score 0 1 0 4 3  Altered sleeping  0 0 1   Tired, decreased energy  1 0 1   Change in appetite  1 0 0   Feeling bad or failure about yourself   1 0 0   Trouble concentrating  0 0 0   Moving slowly or fidgety/restless  0 0 0   Suicidal thoughts  0 0 0   PHQ-9 Score  4  0  6    Difficult doing work/chores  Not difficult at all Not difficult at all Not difficult at all      Data saved with a previous flowsheet row definition   Recurrent UTI's -Following Urology -Currently on vaginal estrogen, no UTI's since starting  Health Maintenance: -Blood work UTD    Review of Systems  All other systems reviewed and are negative.     Objective:     BP 110/78   Pulse 63   Temp 98.6 F (37 C)   Ht 5' (1.524  m)   Wt 199 lb (90.3 kg)   LMP 09/13/2019 (LMP Unknown)   SpO2 97%   BMI 38.86 kg/m  BP Readings from Last 3 Encounters:  06/22/24 110/78  12/25/23 120/64  12/05/23 130/78   Wt Readings from Last 3 Encounters:  06/22/24 199 lb (90.3 kg)  12/25/23 194 lb 14.4 oz (88.4 kg)  12/05/23 192 lb 9.6 oz (87.4 kg)      Physical Exam Constitutional:      Appearance: Normal appearance.  HENT:     Head: Normocephalic and atraumatic.  Eyes:     Conjunctiva/sclera: Conjunctivae normal.  Cardiovascular:     Rate and Rhythm: Normal rate and regular rhythm.  Pulmonary:     Effort: Pulmonary effort is normal.     Breath sounds: Normal breath sounds.  Skin:     General: Skin is warm and dry.  Neurological:     General: No focal deficit present.     Mental Status: She is alert. Mental status is at baseline.  Psychiatric:        Mood and Affect: Mood normal.        Behavior: Behavior normal.      No results found for any visits on 06/22/24.  Last CBC Lab Results  Component Value Date   WBC 4.7 12/25/2023   HGB 12.4 12/25/2023   HCT 36.8 12/25/2023   MCV 94.8 12/25/2023   MCH 32.0 12/25/2023   RDW 13.3 12/25/2023   PLT 281 12/25/2023   Last metabolic panel Lab Results  Component Value Date   GLUCOSE 112 (H) 12/25/2023   NA 138 12/25/2023   K 4.2 12/25/2023   CL 104 12/25/2023   CO2 25 12/25/2023   BUN 17 12/25/2023   CREATININE 0.77 12/25/2023   GFRNONAA >60 12/25/2023   CALCIUM 8.9 12/25/2023   PROT 6.6 12/25/2023   ALBUMIN 3.6 12/25/2023   BILITOT 0.6 12/25/2023   ALKPHOS 91 12/25/2023   AST 22 12/25/2023   ALT 21 12/25/2023   ANIONGAP 9 12/25/2023   Last lipids Lab Results  Component Value Date   CHOL 235 (H) 12/05/2023   HDL 47 (L) 12/05/2023   LDLCALC 152 (H) 12/05/2023   TRIG 218 (H) 12/05/2023   CHOLHDL 5.0 (H) 12/05/2023   Last hemoglobin A1c Lab Results  Component Value Date   HGBA1C 5.4 12/05/2023   Last thyroid  functions Lab Results  Component Value Date   TSH 2.82 12/05/2023   T3TOTAL 110 02/03/2018   T4TOTAL 6.5 02/03/2018   FREET4 1.1 12/05/2023   Last vitamin D  Lab Results  Component Value Date   VD25OH 29 (L) 04/25/2020   Last vitamin B12 and Folate No results found for: VITAMINB12, FOLATE    The 10-year ASCVD risk score (Arnett DK, et al., 2019) is: 4.1%    Assessment & Plan:   Assessment & Plan Breast cancer, status post lumpectomy, chemotherapy, and radiation Breast cancer treated with lumpectomy, chemotherapy, and radiation. Currently on Arimidex  with oncologist follow-up. - Continue Arimidex  as prescribed by oncologist.  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 Recent transplant in August last year. - Continue follow-up with ophthalmology at [facility].  Knee osteoarthritis, bilateral Bilateral knee osteoarthritis with bone-on-bone changes. Previously received cortisone injections. Using expired Voltaren  cream. - Purchase over-the-counter Voltaren  cream (1% diclofenac ) for knee pain. - Consider insurance coverage for prescription Voltaren  gel.  Obesity Recent weight gain post-gallbladder surgery. Previous weight loss with  diet and Wegovy . Insurance no longer covers Wegovy . Discussed oral Wegovy  and compounding pharmacy options. - Consider oral Wegovy  if available and affordable. - Explore compounding pharmacy options for weight loss medications. - Scheduled follow-up in six months to reassess weight management strategies.  Recurrent urinary tract infections, postmenopausal Recurrent UTIs post-menopause managed with estrogen cream. No recent UTIs reported. - Continue estrogen cream as prescribed.  Depression Managed with Paxil  and Ativan  prescribed by [facility]. - Continue Paxil  and Ativan  as prescribed by psychiatrist.  Seborrheic dermatitis (scalp and face) Chronic seborrheic dermatitis managed with ketoconazole  shampoo and cream. Discussed moisturizers for symptom management. - Refilled ketoconazole  shampoo and cream. - Consider using a lightweight moisturizer like Neutrogena gel cream for facial dryness.  Vitamin D  deficiency, on supplementation Vitamin D  deficiency managed with daily vitamin D  and weekly vitamin K supplementation. - Continue daily vitamin D  and weekly vitamin K supplementation.    - ketoconazole  (NIZORAL ) 2 % shampoo; Apply 1 Application topically once a week.  Dispense: 120 mL; Refill: 3 - ketoconazole  (NIZORAL ) 2 % cream; Apply 1 Application topically daily as needed for irritation.  Dispense: 15 g; Refill:  3  Return in about 6 months (around 12/20/2024) for physical.    Sharyle Fischer, DO

## 2024-06-24 ENCOUNTER — Inpatient Hospital Stay: Attending: Oncology

## 2024-06-24 ENCOUNTER — Inpatient Hospital Stay: Admitting: Oncology

## 2024-06-24 ENCOUNTER — Encounter: Payer: Self-pay | Admitting: Oncology

## 2024-06-24 VITALS — BP 129/75 | HR 59 | Temp 98.8°F | Resp 18 | Wt 201.7 lb

## 2024-06-24 DIAGNOSIS — M2550 Pain in unspecified joint: Secondary | ICD-10-CM | POA: Insufficient documentation

## 2024-06-24 DIAGNOSIS — E282 Polycystic ovarian syndrome: Secondary | ICD-10-CM | POA: Diagnosis not present

## 2024-06-24 DIAGNOSIS — C50212 Malignant neoplasm of upper-inner quadrant of left female breast: Secondary | ICD-10-CM

## 2024-06-24 DIAGNOSIS — R232 Flushing: Secondary | ICD-10-CM | POA: Diagnosis not present

## 2024-06-24 DIAGNOSIS — F419 Anxiety disorder, unspecified: Secondary | ICD-10-CM

## 2024-06-24 DIAGNOSIS — Z8614 Personal history of Methicillin resistant Staphylococcus aureus infection: Secondary | ICD-10-CM | POA: Insufficient documentation

## 2024-06-24 DIAGNOSIS — R9230 Dense breasts, unspecified: Secondary | ICD-10-CM | POA: Diagnosis not present

## 2024-06-24 DIAGNOSIS — Z79811 Long term (current) use of aromatase inhibitors: Secondary | ICD-10-CM | POA: Insufficient documentation

## 2024-06-24 DIAGNOSIS — Z17411 Hormone receptor positive with human epidermal growth factor receptor 2 negative status: Secondary | ICD-10-CM | POA: Diagnosis not present

## 2024-06-24 DIAGNOSIS — H579 Unspecified disorder of eye and adnexa: Secondary | ICD-10-CM | POA: Diagnosis not present

## 2024-06-24 DIAGNOSIS — M8000XA Age-related osteoporosis with current pathological fracture, unspecified site, initial encounter for fracture: Secondary | ICD-10-CM | POA: Insufficient documentation

## 2024-06-24 DIAGNOSIS — Z170 Estrogen receptor positive status [ER+]: Secondary | ICD-10-CM | POA: Diagnosis not present

## 2024-06-24 DIAGNOSIS — M3500 Sicca syndrome, unspecified: Secondary | ICD-10-CM | POA: Insufficient documentation

## 2024-06-24 DIAGNOSIS — Z806 Family history of leukemia: Secondary | ICD-10-CM | POA: Insufficient documentation

## 2024-06-24 DIAGNOSIS — Z923 Personal history of irradiation: Secondary | ICD-10-CM | POA: Insufficient documentation

## 2024-06-24 DIAGNOSIS — Z882 Allergy status to sulfonamides status: Secondary | ICD-10-CM | POA: Diagnosis not present

## 2024-06-24 DIAGNOSIS — Z87442 Personal history of urinary calculi: Secondary | ICD-10-CM | POA: Diagnosis not present

## 2024-06-24 DIAGNOSIS — Z803 Family history of malignant neoplasm of breast: Secondary | ICD-10-CM | POA: Insufficient documentation

## 2024-06-24 DIAGNOSIS — Z87891 Personal history of nicotine dependence: Secondary | ICD-10-CM | POA: Diagnosis not present

## 2024-06-24 DIAGNOSIS — F32A Depression, unspecified: Secondary | ICD-10-CM | POA: Diagnosis not present

## 2024-06-24 DIAGNOSIS — M1990 Unspecified osteoarthritis, unspecified site: Secondary | ICD-10-CM | POA: Diagnosis not present

## 2024-06-24 DIAGNOSIS — Z885 Allergy status to narcotic agent status: Secondary | ICD-10-CM | POA: Insufficient documentation

## 2024-06-24 DIAGNOSIS — Z59868 Other specified financial insecurity: Secondary | ICD-10-CM | POA: Diagnosis not present

## 2024-06-24 DIAGNOSIS — Z1721 Progesterone receptor positive status: Secondary | ICD-10-CM | POA: Diagnosis not present

## 2024-06-24 DIAGNOSIS — Z79899 Other long term (current) drug therapy: Secondary | ICD-10-CM | POA: Insufficient documentation

## 2024-06-24 DIAGNOSIS — M8580 Other specified disorders of bone density and structure, unspecified site: Secondary | ICD-10-CM | POA: Diagnosis not present

## 2024-06-24 DIAGNOSIS — Z888 Allergy status to other drugs, medicaments and biological substances status: Secondary | ICD-10-CM | POA: Insufficient documentation

## 2024-06-24 DIAGNOSIS — Z8041 Family history of malignant neoplasm of ovary: Secondary | ICD-10-CM | POA: Insufficient documentation

## 2024-06-24 DIAGNOSIS — Z833 Family history of diabetes mellitus: Secondary | ICD-10-CM | POA: Insufficient documentation

## 2024-06-24 DIAGNOSIS — Z800 Family history of malignant neoplasm of digestive organs: Secondary | ICD-10-CM | POA: Insufficient documentation

## 2024-06-24 DIAGNOSIS — Z808 Family history of malignant neoplasm of other organs or systems: Secondary | ICD-10-CM | POA: Insufficient documentation

## 2024-06-24 DIAGNOSIS — Z801 Family history of malignant neoplasm of trachea, bronchus and lung: Secondary | ICD-10-CM | POA: Insufficient documentation

## 2024-06-24 DIAGNOSIS — Z9089 Acquired absence of other organs: Secondary | ICD-10-CM | POA: Insufficient documentation

## 2024-06-24 LAB — CBC WITH DIFFERENTIAL (CANCER CENTER ONLY)
Abs Immature Granulocytes: 0.02 K/uL (ref 0.00–0.07)
Basophils Absolute: 0 K/uL (ref 0.0–0.1)
Basophils Relative: 0 %
Eosinophils Absolute: 0.1 K/uL (ref 0.0–0.5)
Eosinophils Relative: 2 %
HCT: 38.4 % (ref 36.0–46.0)
Hemoglobin: 12.8 g/dL (ref 12.0–15.0)
Immature Granulocytes: 0 %
Lymphocytes Relative: 43 %
Lymphs Abs: 2.4 K/uL (ref 0.7–4.0)
MCH: 32.1 pg (ref 26.0–34.0)
MCHC: 33.3 g/dL (ref 30.0–36.0)
MCV: 96.2 fL (ref 80.0–100.0)
Monocytes Absolute: 0.6 K/uL (ref 0.1–1.0)
Monocytes Relative: 11 %
Neutro Abs: 2.4 K/uL (ref 1.7–7.7)
Neutrophils Relative %: 44 %
Platelet Count: 284 K/uL (ref 150–400)
RBC: 3.99 MIL/uL (ref 3.87–5.11)
RDW: 13.1 % (ref 11.5–15.5)
WBC Count: 5.5 K/uL (ref 4.0–10.5)
nRBC: 0 % (ref 0.0–0.2)

## 2024-06-24 LAB — CMP (CANCER CENTER ONLY)
ALT: 22 U/L (ref 0–44)
AST: 22 U/L (ref 15–41)
Albumin: 3.6 g/dL (ref 3.5–5.0)
Alkaline Phosphatase: 96 U/L (ref 38–126)
Anion gap: 5 (ref 5–15)
BUN: 20 mg/dL (ref 8–23)
CO2: 27 mmol/L (ref 22–32)
Calcium: 9.4 mg/dL (ref 8.9–10.3)
Chloride: 106 mmol/L (ref 98–111)
Creatinine: 0.61 mg/dL (ref 0.44–1.00)
GFR, Estimated: >60 mL/min (ref >60)
Glucose, Bld: 77 mg/dL (ref 70–99)
Potassium: 4.4 mmol/L (ref 3.5–5.1)
Sodium: 138 mmol/L (ref 135–145)
Total Bilirubin: 0.4 mg/dL (ref 0.0–1.2)
Total Protein: 6.6 g/dL (ref 6.5–8.1)

## 2024-06-24 MED ORDER — ANASTROZOLE 1 MG PO TABS: 1.0000 mg | ORAL_TABLET | Freq: Every day | ORAL | 1 refills | Status: AC

## 2024-06-24 NOTE — Assessment & Plan Note (Addendum)
 Stage IA, left Breast cancer.  Labs reviewed and discussed with patient. Continue Arimidex , extended endocrine therapy, till March 2029 if she tolerates.  Recommend annual mammogram. -recent mammogram results were discussed.  Next due October 2026

## 2024-06-24 NOTE — Progress Notes (Signed)
 Hematology/Oncology Progress note Telephone:([phone]) [phone] Fax:([phone]) [phone]     REASON FOR VISIT Follow up for breast cancer  ASSESSMENT & PLAN:   Cancer Staging  Malignant neoplasm of upper-inner quadrant of left breast in female, estrogen receptor positive (HCC) Staging form: Breast, AJCC 8th Edition - Clinical stage from 05/22/2017: Stage IA (cT1, cN0, cM0, G1, ER+, PR+, HER2-) - Signed by Babara Call, MD on 05/22/2017 - Pathologic stage from 07/09/2017: Stage IA (pT1b, pN0, cM0, G2, ER+, PR+, HER2-) - Signed by Babara Call, MD on 07/30/2017   Malignant neoplasm of upper-inner quadrant of left breast in female, estrogen receptor positive (HCC) Stage IA, left Breast cancer.  Labs reviewed and discussed with patient. Continue Arimidex , extended endocrine therapy, till March 2029 if she tolerates.  Recommend annual mammogram. -recent mammogram results were discussed.  Next due October 2026    Anxiety Patient is on Paxil .  Follow-up with PCP  Osteopenia recommend patient to continue calcium and vitamin D  supplementation.   12/17/2023 DEXA - Osteopenia Major osteoporotic fracture: 8.3%  Orders Placed This Encounter  Procedures   CMP (Cancer Center only)    Standing Status:   Future    Expected Date:   12/22/2024    Expiration Date:   03/22/2025   CBC with Differential (Cancer Center Only)    Standing Status:   Future    Expected Date:   12/22/2024    Expiration Date:   03/22/2025   Follow-up in 6 months. All questions were answered. The patient knows to call the clinic with any problems, questions or concerns.  Call Babara, MD, PhD [facility]ematology Oncology 06/24/2024    HISTORY OF PRESENTING ILLNESS:  Sheena Martinez is a  63 y.o.  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 Margin is negative, no LVI. Mammoprintr revealed 10 year recurrence rate at 29% high risk group. Patient has completed MammoSite RT.  Denies any hormonal replacement therapy.  Adjuvant chemotherapy with TC, finished 3 cycles.  She is perimenopausal.   # His Sjogren disease for which she has had tear duct surgery. She also reports history of PCOS with elevated testosterone level.   Genetic testing:  Genetic testing result vis [facility] showed no pathogenic sequence appearance or deletions /duplications.  # she developed corneal ulcer for which she has to undergo emergency ophthalmology surgery.  This is considered to be related to Sjogren's syndrome.  Patient has been started on prednisone by ophthalmologist.  Her anxiety has not been well controlled lately due to her ophthalmology problems.  Patient has been on Paxil  for a long time and recently switched to Lexapro  as Paxil  interfere with efficacy of tamoxifen .  She feels her anxiety is not well controlled.  Treatment Adjuvant TC x3. Patient declined cycle 4.  Started on Tamoxifen  in March 2019. Declined option of ovarian suppression with aromatase inhibitor as patient is concerned about having new side effects.    She has anxiety and follows up with [facility] psychiatrist and was previously on Zoloft and Ativan .  Zoloft was switched back to Paxil  after tamoxifen  was discontinued.  Hyperthyroidism, on methimazole.  Follows up with endocrinology. Cornea ulcer, s/p cornea transplant. Currently on steroid eye drop Denies any fever, chills, chest pain, shortness of breath, abdominal pain, back pain, lower extremity swelling. Denies any concern of her breast.  She has had bilateral diagnostic mammogram done on 05/06/2019  which did not show malignant findings. Bone density 03/15/2019 showed osteopenia.   #  Adjuvant tamoxifen , switched to Zoladex  plus Arimidex , Zoladex  was discontinued after confirming postmenopausal state.   patient had needed multiple eye.  Injections for treatment of MRSA left infection and has been on vancomycin eyedrops.  She has lost vision in both eyes, left  worse than right.  She follows up mostly with her ophthalmologist.  She is also on methotrexate for autoimmune diseases.  Follows up with rheumatology.   INTERVAL HISTORY Sheena Martinez is a 63 y.o. female who has above history reviewed by me today presents for follow up visit for management of breast cancer.  Patient takes Arimidex  daily.  Overall tolerates well.  No new complaints.  She has no breast concerns today. She follows up with ophthalmologist for her eye problems.  Patient uses a pea-sized amount of estrogen cream due to vaginal introitus twice weekly.  Prescribed by urology   Review of Systems  Constitutional:  Negative for appetite change, chills, fatigue and fever.  HENT:   Negative for hearing loss and voice change.   Eyes:  Positive for eye problems.  Respiratory:  Negative for chest tightness and cough.   Cardiovascular:  Negative for chest pain.  Gastrointestinal:  Negative for abdominal distention, abdominal pain and blood in stool.  Endocrine: Positive for hot flashes.  Genitourinary:  Negative for difficulty urinating and frequency.   Musculoskeletal:  Positive for arthralgias.  Skin:  Negative for itching and rash.  Neurological:  Negative for extremity weakness.  Hematological:  Negative for adenopathy.  Psychiatric/Behavioral:  Negative for confusion.     MEDICAL HISTORY:  Past Medical History:  Diagnosis Date   Anxiety    Arthritis    Breast cancer (HCC) 05/12/2017   INVASIVE MAMMARY CARCINOMA ER/PR positive LEFT BREAST UPPER inner  QUAD   Cataract 01/11/2020   left   Corneal perforation of left eye 10/09/2017   Overview:  Added automatically from request for surgery [id number]   Depression    siuational   GERD (gastroesophageal reflux disease)    OCC   Goiter    Hematuria 10/21/2018   History of kidney stones    Hyperthyroidism    has nodules, no meds   MRSA infection 2023   corneal infections in the left eye   Personal history of radiation  therapy 2018   LEFT lumpectomy   Pre-diabetes    lost 64 pds   Sjogren's syndrome    Sleep apnea    Uses continuous positive airway pressure (CPAP) ventilation at home 04/30/2019    SURGICAL HISTORY: Past Surgical History:  Procedure Laterality Date   BREAST BIOPSY Left 05/12/2017   us  bx/ INVASIVE MAMMARY CARCINOMA   BREAST LUMPECTOMY Left 05/2017   invasive mammary carcinoma, neg margins   BREAST LUMPECTOMY WITH SENTINEL LYMPH NODE BIOPSY Left 06/10/2017   Procedure: BREAST LUMPECTOMY WITH SENTINEL LYMPH NODE BX AND NEEDLE LOCALIZATION;  Surgeon: Dessa Reyes ORN, MD;  Location: [facility] ORS;  Service: General;  Laterality: Left;   BREAST MAMMOSITE Left 06/24/2017   Procedure: MAMMOSITE BREAST;  Surgeon: Dessa Reyes ORN, MD;  Location: [facility] ORS;  Service: General;  Laterality: Left;   CESAREAN SECTION  1995   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   COSMETIC SURGERY     CYST EXCISION  2018   pilar cyst/ Dr Demetria   EYE SURGERY     PORTACATH PLACEMENT Right 07/08/2017   Procedure: INSERTION PORT-A-CATH;  Surgeon: Dessa Reyes ORN, MD;  Location: [facility] ORS;  Service:  General;  Laterality: Right;   TONSILLECTOMY      SOCIAL HISTORY: Social History   Socioeconomic History   Marital status: Married    Spouse name: Marcey   Number of children: 3   Years of education: Not on file   Highest education level: GED or equivalent  Occupational History   Occupation: unemployed  Tobacco Use   Smoking status: Former    Current packs/day: 0.00    Types: Cigarettes    Start date: 08/12/1972    Quit date: 08/13/1979    Years since quitting: 44.8   Smokeless tobacco: Never   Tobacco comments:    AGE [age]  Vaping Use   Vaping status: Never Used  Substance and Sexual Activity   Alcohol use: No   Drug use: No   Sexual activity: Yes    Partners: Male  Other Topics Concern   Not on file  Social History Narrative   Lives in home with 2 sons, 2 grandson    Social Drivers of Health   Financial Resource Strain: Medium Risk (06/18/2024)   Overall Financial Resource Strain (CARDIA)    Difficulty of Paying Living Expenses: Somewhat hard  Food Insecurity: No Food Insecurity (06/18/2024)   Hunger Vital Sign    Worried About Running Out of Food in the Last Year: Never true    Ran Out of Food in the Last Year: Never true  Transportation Needs: No Transportation Needs (06/18/2024)   PRAPARE - Administrator, Civil Service (Medical): No    Lack of Transportation (Non-Medical): No  Physical Activity: Insufficiently Active (06/18/2024)   Exercise Vital Sign    Days of Exercise per Week: 1 day    Minutes of Exercise per Session: 30 min  Stress: No Stress Concern Present (06/18/2024)   [employer] of Occupational Health - Occupational Stress Questionnaire    Feeling of Stress: Only a little  Social Connections: Socially Integrated (06/18/2024)   Social Connection and Isolation Panel    Frequency of Communication with Friends and Family: Once a week    Frequency of Social Gatherings with Friends and Family: Three times a week    Attends Religious Services: More than 4 times per year    Active Member of Clubs or Organizations: Yes    Attends Banker Meetings: More than 4 times per year    Marital Status: Married  Catering Manager Violence: Not At Risk (12/05/2023)   Humiliation, Afraid, Rape, and Kick questionnaire    Fear of Current or Ex-Partner: No    Emotionally Abused: No    Physically Abused: No    Sexually Abused: No    FAMILY HISTORY: Family History  Problem Relation Age of Onset   Breast cancer Maternal Aunt 45       currently ~65   Diabetes Mother    Skin cancer Mother        currently 16; TAH/BSO (age?)   Anemia Mother    Liver disease Father        deceased / not much info about him / alcoholic   Lung cancer Paternal Aunt        smoker / deceased 29s   Stomach cancer Paternal Uncle 46        deceased / deceased 73s   Ovarian cancer Maternal Grandmother 48       deceased 90s   Stomach cancer Paternal Grandfather 59       deceased 70s   Cancer Maternal Uncle  unk. type; deceased 51s   Leukemia Cousin 69       deceased 110    ALLERGIES:  is allergic to rituximab, benadryl [diphenhydramine], clonazepam, cortisone, diphenhydramine hcl, oxycodone, and sulfamethoxazole-trimethoprim.  MEDICATIONS:  Current Outpatient Medications  Medication Sig Dispense Refill   acetaminophen  (TYLENOL ) 500 MG tablet Take 1,000 mg by mouth every 8 (eight) hours as needed for mild pain or headache.     Cholecalciferol (VITAMIN D ) 50 MCG (2000 UT) tablet Take 2,000 Units by mouth daily.     conjugated estrogens  (PREMARIN ) vaginal cream Apply a pea-sized amount to fingertip or Q-tip and white in the vaginal roof twice weekly 42.5 g 1   cycloSPORINE 0.1 % SOLN Place 1 drop into the left eye in the morning, at noon, in the evening, and at bedtime.     diclofenac  (VOLTAREN ) 50 MG EC tablet Take 50 mg by mouth 2 (two) times daily with a meal.     hydroxypropyl methylcellulose / hypromellose (ISOPTO TEARS / GONIOVISC) 2.5 % ophthalmic solution Place 1 drop into both eyes as needed for dry eyes.     ketoconazole  (NIZORAL ) 2 % cream Apply 1 Application topically daily as needed for irritation. 15 g 3   ketoconazole  (NIZORAL ) 2 % shampoo Apply 1 Application topically once a week. 120 mL 3   LORazepam  (ATIVAN ) 0.5 MG tablet May take 0.5-1 mg (1 to 2 tablets) po daily prn for severe stress/anxiety symptoms, use sparingly, not for daily use, rx to last more than 3 months, no refills 20 tablet 0   loteprednol (LOTEMAX) 0.5 % ophthalmic suspension Place 1 drop into the right eye in the morning and at bedtime.     natamycin (NATACYN) 5 % ophthalmic suspension Place 1 drop into the left eye as needed (irritation).     PARoxetine  (PAXIL ) 20 MG tablet Take 20 mg by mouth daily.     prednisoLONE acetate (PRED FORTE)  1 % ophthalmic suspension Place 1 drop into the left eye in the morning and at bedtime.     vitamin k 100 MCG tablet Take 100 mcg by mouth 2 (two) times a week.     anastrozole  (ARIMIDEX ) 1 MG tablet Take 1 tablet (1 mg total) by mouth daily. 90 tablet 1   NON FORMULARY Place 1 drop into the left eye in the morning, at noon, in the evening, and at bedtime. voriconazole (VFEND) ophthalmic solution 1% Administer 1 drop into the left eye every hour for 14 days. Discard after 7 days and refill for full 14 days.     No current facility-administered medications for this visit.   Facility-Administered Medications Ordered in Other Visits  Medication Dose Route Frequency Provider Last Rate Last Admin   goserelin (ZOLADEX ) injection 3.6 mg  3.6 mg Subcutaneous Once Babara Call, MD          .  PHYSICAL EXAMINATION: ECOG PERFORMANCE STATUS: 1 - Symptomatic but completely ambulatory Vitals:   06/24/24 1436  BP: 129/75  Pulse: (!) 59  Resp: 18  Temp: 98.8 F (37.1 C)  SpO2: 98%    Physical Exam Constitutional:      General: She is not in acute distress.    Appearance: She is not diaphoretic.     Comments: Obese.   HENT:     Head: Normocephalic and atraumatic.  Eyes:     General: No scleral icterus. Neck:     Thyroid : No thyromegaly.  Cardiovascular:     Rate and Rhythm: Normal rate and  regular rhythm.     Heart sounds: Normal heart sounds. No murmur heard. Pulmonary:     Effort: Pulmonary effort is normal. No respiratory distress.     Breath sounds: Normal breath sounds. No wheezing.  Abdominal:     General: Bowel sounds are normal. There is no distension.     Palpations: Abdomen is soft.     Tenderness: There is no abdominal tenderness.  Musculoskeletal:        General: Normal range of motion.     Cervical back: Normal range of motion and neck supple.  Skin:    General: Skin is warm and dry.     Findings: No erythema or rash.  Neurological:     Mental Status: She is alert and  oriented to person, place, and time. Mental status is at baseline.     Motor: No abnormal muscle tone.  Psychiatric:        Mood and Affect: Mood and affect normal.   Breast exam was performed in seated and lying down position. No palpable mass in bilateral breast.  No palpable axillary lymphadenopathy bilaterally.   LABORATORY DATA:  I have reviewed the data as listed: no recent labs.     Latest Ref Rng & Units 06/24/2024    1:53 PM 12/25/2023    1:31 PM 12/05/2023    3:40 PM  CBC  WBC 4.0 - 10.5 K/uL 5.5  4.7  5.3   Hemoglobin 12.0 - 15.0 g/dL 87.1  87.5  88.0   Hematocrit 36.0 - 46.0 % 38.4  36.8  35.7   Platelets 150 - 400 K/uL 284  281  280       Latest Ref Rng & Units 06/24/2024    1:54 PM 12/25/2023    1:31 PM 12/05/2023    3:40 PM  CMP  Glucose 70 - 99 mg/dL 77  887  82   BUN 8 - 23 mg/dL 20  17  25    Creatinine 0.44 - 1.00 mg/dL 9.38  9.22  9.12   Sodium 135 - 145 mmol/L 138  138  144   Potassium 3.5 - 5.1 mmol/L 4.4  4.2  4.9   Chloride 98 - 111 mmol/L 106  104  107   CO2 22 - 32 mmol/L 27  25  30    Calcium 8.9 - 10.3 mg/dL 9.4  8.9  9.3   Total Protein 6.5 - 8.1 g/dL 6.6  6.6  6.4   Total Bilirubin 0.0 - 1.2 mg/dL 0.4  0.6  0.3   Alkaline Phos 38 - 126 U/L 96  91    AST 15 - 41 U/L 22  22  19    ALT 0 - 44 U/L 22  21  18         RADIOGRAPHIC STUDIES: I have personally reviewed the radiological images as listed and agreed with the findings in the report. MM 3D SCREENING MAMMOGRAM BILATERAL BREAST Result Date: 06/02/2024 CLINICAL DATA:  Screening. EXAM: DIGITAL SCREENING BILATERAL MAMMOGRAM WITH TOMOSYNTHESIS AND CAD TECHNIQUE: Bilateral screening digital craniocaudal and mediolateral oblique mammograms were obtained. Bilateral screening digital breast tomosynthesis was performed. The images were evaluated with computer-aided detection. COMPARISON:  Previous exam(s). ACR Breast Density Category b: There are scattered areas of fibroglandular density. FINDINGS:  Sequela of left breast lumpectomy. No findings suspicious for malignancy in either breast. IMPRESSION: No mammographic evidence of malignancy. A result letter of this screening mammogram will be mailed directly to the patient. RECOMMENDATION: Screening mammogram in one year. (  Code:SM-B-01Y) BI-RADS CATEGORY  2: Benign. Electronically Signed   By: Curtistine Noble   On: 06/02/2024 09:29

## 2024-06-24 NOTE — Assessment & Plan Note (Signed)
 Patient is on Paxil.  Follow-up with PCP

## 2024-06-24 NOTE — Assessment & Plan Note (Signed)
 recommend patient to continue calcium and vitamin D  supplementation.  12/17/2023 DEXA - Osteopenia Major osteoporotic fracture: 8.3%

## 2024-07-24 DIAGNOSIS — H43811 Vitreous degeneration, right eye: Secondary | ICD-10-CM | POA: Diagnosis not present

## 2024-07-24 DIAGNOSIS — L723 Sebaceous cyst: Secondary | ICD-10-CM | POA: Diagnosis not present

## 2024-07-24 DIAGNOSIS — E785 Hyperlipidemia, unspecified: Secondary | ICD-10-CM | POA: Diagnosis not present

## 2024-07-24 DIAGNOSIS — C50212 Malignant neoplasm of upper-inner quadrant of left female breast: Secondary | ICD-10-CM | POA: Diagnosis not present

## 2024-07-24 DIAGNOSIS — I739 Peripheral vascular disease, unspecified: Secondary | ICD-10-CM | POA: Diagnosis not present

## 2024-07-24 DIAGNOSIS — Z79811 Long term (current) use of aromatase inhibitors: Secondary | ICD-10-CM | POA: Diagnosis not present

## 2024-07-24 DIAGNOSIS — M3500 Sicca syndrome, unspecified: Secondary | ICD-10-CM | POA: Diagnosis not present

## 2024-07-24 DIAGNOSIS — E559 Vitamin D deficiency, unspecified: Secondary | ICD-10-CM | POA: Diagnosis not present

## 2024-07-24 DIAGNOSIS — Z87891 Personal history of nicotine dependence: Secondary | ICD-10-CM | POA: Diagnosis not present

## 2024-07-24 DIAGNOSIS — Z79899 Other long term (current) drug therapy: Secondary | ICD-10-CM | POA: Diagnosis not present

## 2024-07-24 DIAGNOSIS — Z791 Long term (current) use of non-steroidal anti-inflammatories (NSAID): Secondary | ICD-10-CM | POA: Diagnosis not present

## 2024-07-24 DIAGNOSIS — E663 Overweight: Secondary | ICD-10-CM | POA: Diagnosis not present

## 2024-07-24 DIAGNOSIS — Z7952 Long term (current) use of systemic steroids: Secondary | ICD-10-CM | POA: Diagnosis not present

## 2024-07-24 DIAGNOSIS — Z885 Allergy status to narcotic agent status: Secondary | ICD-10-CM | POA: Diagnosis not present

## 2024-07-24 DIAGNOSIS — Z170 Estrogen receptor positive status [ER+]: Secondary | ICD-10-CM | POA: Diagnosis not present

## 2024-07-24 DIAGNOSIS — Z59868 Other specified financial insecurity: Secondary | ICD-10-CM | POA: Diagnosis not present

## 2024-07-24 DIAGNOSIS — Z1211 Encounter for screening for malignant neoplasm of colon: Secondary | ICD-10-CM | POA: Diagnosis not present

## 2024-07-24 DIAGNOSIS — Z124 Encounter for screening for malignant neoplasm of cervix: Secondary | ICD-10-CM | POA: Diagnosis not present

## 2024-07-24 DIAGNOSIS — Z888 Allergy status to other drugs, medicaments and biological substances status: Secondary | ICD-10-CM | POA: Diagnosis not present

## 2024-07-28 DIAGNOSIS — M65311 Trigger thumb, right thumb: Secondary | ICD-10-CM | POA: Diagnosis not present

## 2024-12-20 ENCOUNTER — Encounter: Admitting: Internal Medicine

## 2024-12-29 ENCOUNTER — Inpatient Hospital Stay: Admitting: Oncology

## 2024-12-29 ENCOUNTER — Inpatient Hospital Stay
